# Patient Record
Sex: Male | Born: 1950
Health system: Southern US, Community
[De-identification: ages and names within clinical notes are randomized; demographics above are authoritative.]

## PROBLEM LIST (undated history)

## (undated) DIAGNOSIS — E785 Hyperlipidemia, unspecified: Secondary | ICD-10-CM

## (undated) DIAGNOSIS — R569 Unspecified convulsions: Secondary | ICD-10-CM

## (undated) DIAGNOSIS — K219 Gastro-esophageal reflux disease without esophagitis: Secondary | ICD-10-CM

## (undated) DIAGNOSIS — Z9889 Other specified postprocedural states: Secondary | ICD-10-CM

## (undated) DIAGNOSIS — H34239 Retinal artery branch occlusion, unspecified eye: Secondary | ICD-10-CM

## (undated) DIAGNOSIS — I639 Cerebral infarction, unspecified: Secondary | ICD-10-CM

## (undated) DIAGNOSIS — N261 Atrophy of kidney (terminal): Secondary | ICD-10-CM

## (undated) DIAGNOSIS — M549 Dorsalgia, unspecified: Secondary | ICD-10-CM

## (undated) DIAGNOSIS — I519 Heart disease, unspecified: Secondary | ICD-10-CM

## (undated) DIAGNOSIS — Z8719 Personal history of other diseases of the digestive system: Secondary | ICD-10-CM

## (undated) DIAGNOSIS — I63539 Cerebral infarction due to unspecified occlusion or stenosis of unspecified posterior cerebral artery: Secondary | ICD-10-CM

## (undated) DIAGNOSIS — Z972 Presence of dental prosthetic device (complete) (partial): Secondary | ICD-10-CM

## (undated) DIAGNOSIS — I6521 Occlusion and stenosis of right carotid artery: Secondary | ICD-10-CM

## (undated) DIAGNOSIS — N1330 Unspecified hydronephrosis: Secondary | ICD-10-CM

## (undated) DIAGNOSIS — G8929 Other chronic pain: Secondary | ICD-10-CM

## (undated) DIAGNOSIS — M1990 Unspecified osteoarthritis, unspecified site: Secondary | ICD-10-CM

## (undated) DIAGNOSIS — G5603 Carpal tunnel syndrome, bilateral upper limbs: Secondary | ICD-10-CM

## (undated) DIAGNOSIS — Z8781 Personal history of (healed) traumatic fracture: Secondary | ICD-10-CM

## (undated) DIAGNOSIS — G4730 Sleep apnea, unspecified: Secondary | ICD-10-CM

## (undated) DIAGNOSIS — E119 Type 2 diabetes mellitus without complications: Secondary | ICD-10-CM

## (undated) DIAGNOSIS — I723 Aneurysm of iliac artery: Secondary | ICD-10-CM

## (undated) DIAGNOSIS — H259 Unspecified age-related cataract: Secondary | ICD-10-CM

## (undated) DIAGNOSIS — R112 Nausea with vomiting, unspecified: Secondary | ICD-10-CM

## (undated) DIAGNOSIS — I219 Acute myocardial infarction, unspecified: Secondary | ICD-10-CM

## (undated) DIAGNOSIS — I10 Essential (primary) hypertension: Secondary | ICD-10-CM

## (undated) DIAGNOSIS — N200 Calculus of kidney: Secondary | ICD-10-CM

## (undated) DIAGNOSIS — I6529 Occlusion and stenosis of unspecified carotid artery: Secondary | ICD-10-CM

## (undated) DIAGNOSIS — E041 Nontoxic single thyroid nodule: Secondary | ICD-10-CM

## (undated) DIAGNOSIS — I2510 Atherosclerotic heart disease of native coronary artery without angina pectoris: Secondary | ICD-10-CM

## (undated) DIAGNOSIS — I779 Disorder of arteries and arterioles, unspecified: Secondary | ICD-10-CM

## (undated) DIAGNOSIS — I517 Cardiomegaly: Secondary | ICD-10-CM

## (undated) DIAGNOSIS — J189 Pneumonia, unspecified organism: Secondary | ICD-10-CM

## (undated) DIAGNOSIS — Z87442 Personal history of urinary calculi: Secondary | ICD-10-CM

## (undated) DIAGNOSIS — Z8673 Personal history of transient ischemic attack (TIA), and cerebral infarction without residual deficits: Secondary | ICD-10-CM

## (undated) DIAGNOSIS — K859 Acute pancreatitis without necrosis or infection, unspecified: Principal | ICD-10-CM

## (undated) DIAGNOSIS — H02889 Meibomian gland dysfunction of unspecified eye, unspecified eyelid: Secondary | ICD-10-CM

## (undated) HISTORY — PX: BACK SURGERY: SHX140

## (undated) HISTORY — PX: MULTIPLE TOOTH EXTRACTIONS: SHX2053

## (undated) HISTORY — DX: Unspecified convulsions: R56.9

## (undated) HISTORY — DX: Essential (primary) hypertension: I10

## (undated) HISTORY — DX: Disorder of arteries and arterioles, unspecified: I77.9

## (undated) HISTORY — PX: COLONOSCOPY: SHX174

## (undated) HISTORY — PX: HERNIA REPAIR: SHX51

## (undated) HISTORY — DX: Personal history of transient ischemic attack (TIA), and cerebral infarction without residual deficits: Z86.73

## (undated) HISTORY — DX: Type 2 diabetes mellitus without complications: E11.9

## (undated) HISTORY — PX: OTHER SURGICAL HISTORY: SHX169

## (undated) HISTORY — DX: Hyperlipidemia, unspecified: E78.5

## (undated) HISTORY — DX: Cerebral infarction, unspecified: I63.9

---

## 1997-11-13 ENCOUNTER — Emergency Department (HOSPITAL_COMMUNITY): Admission: EM | Admit: 1997-11-13 | Discharge: 1997-11-13 | Payer: Self-pay | Admitting: Emergency Medicine

## 1999-10-20 ENCOUNTER — Encounter: Admission: RE | Admit: 1999-10-20 | Discharge: 1999-10-20 | Payer: Self-pay | Admitting: Neurosurgery

## 1999-10-20 ENCOUNTER — Encounter: Payer: Self-pay | Admitting: Neurosurgery

## 2001-08-25 ENCOUNTER — Encounter: Payer: Self-pay | Admitting: Family Medicine

## 2001-08-25 ENCOUNTER — Ambulatory Visit (HOSPITAL_COMMUNITY): Admission: RE | Admit: 2001-08-25 | Discharge: 2001-08-25 | Payer: Self-pay | Admitting: Family Medicine

## 2001-09-06 ENCOUNTER — Ambulatory Visit (HOSPITAL_COMMUNITY): Admission: RE | Admit: 2001-09-06 | Discharge: 2001-09-06 | Payer: Self-pay | Admitting: Family Medicine

## 2001-09-06 ENCOUNTER — Encounter (INDEPENDENT_AMBULATORY_CARE_PROVIDER_SITE_OTHER): Payer: Self-pay

## 2001-09-06 ENCOUNTER — Encounter: Payer: Self-pay | Admitting: Family Medicine

## 2003-05-12 HISTORY — PX: CORONARY ANGIOPLASTY: SHX604

## 2003-12-05 ENCOUNTER — Ambulatory Visit (HOSPITAL_COMMUNITY): Admission: RE | Admit: 2003-12-05 | Discharge: 2003-12-06 | Payer: Self-pay | Admitting: Cardiology

## 2008-06-29 ENCOUNTER — Emergency Department (HOSPITAL_COMMUNITY): Admission: EM | Admit: 2008-06-29 | Discharge: 2008-06-29 | Payer: Self-pay | Admitting: Emergency Medicine

## 2010-09-26 NOTE — Cardiovascular Report (Signed)
NAME:  Shaun Pugh, Shaun Pugh                           ACCOUNT NO.:  [id number]   MEDICAL RECORD NO.:  [id number]                   PATIENT TYPE:  OIB   LOCATION:  2899                                 FACILITY:  [facility]   PHYSICIAN:  Francisca December, M.D.               DATE OF BIRTH:  1951/03/28   DATE OF PROCEDURE:  12/05/2003  DATE OF DISCHARGE:                              CARDIAC CATHETERIZATION   PROCEDURES PERFORMED:  1. Percutaneous coronary intervention/drug-eluting stent implantation distal     right coronary artery.  2. Percutaneous coronary intervention/drug-eluting stent implantation mid     left anterior descending.  3. Percutaneous closure of right femoral artery.   INDICATION:  [name] is a 60 year old man with rather typical  exertional angina pectoris.  He has undergone a myocardial perfusion study  that showed mild reversible anterior/inferior defects.  Coronary angiography  has confirmed the presence of a 75% mid tubular LAD lesion.  There is also a  distal diffuse RCA lesion of 80%-90% stenosis.  He is brought now to the  cardiac catheterization laboratory for percutaneous revascularization.   DESCRIPTION OF PROCEDURE:  The patient was brought to the cardiac  catheterization laboratory in a fasting state.  The right groin was prepped  and draped in the usual sterile fashion.  Local anesthesia was obtained with  infiltration of 1% lidocaine.  A 6-French catheterization sheath was  inserted percutaneously into the right femoral artery utilizing an anterior  approach over a guiding J-wire.  A 6-French #4 right coronary artery guiding  catheter was advanced to the ascending aorta where the right coronary ostia  was engaged.  The patient received an initial bolus of bivalirudin at 0.75  mg/kg and then a constant infusion of 1.75 mg/kg per hour.  The resultant  ACT five minutes later was 334 seconds.  A 0.014 inch Scimed luge  intercoronary guidewire was passed across  the lesion in the distal right  coronary without difficulty.  The lesion was primarily stented using a  3.5/28 mm Cordis Cypher drug-eluting stent.  This was carefully placed  across a distal lesion and inflated to a peak pressure of 18 atmospheres on  two occasions.  This resulted in wide patency of the distal right coronary  with an excellent angiographic result.  There was a stepup and stepdown in  the proximal and distal edges of the stent.  There was good TIMI-III flow.  Following confirmation of adequate patency in orthogonal views, the guide  catheter and the guiding wire were removed.  Repeat angiography was  performed in the RAO projection.  The guiding catheter was then removed.  It  was exchanged for a  6-French #3.5 CLS left coronary guiding catheter.  A 0.014 inch Scimed luge  wire was advanced down the LAD without difficulty.  A lesion in the  midportion of the vessel again was primarily stented using a  3.0/28 mm Cypher stent.  This was inflated to 14 atmospheres for 50 seconds.  The stent balloon was removed and it was replaced with a 3.25/12 mm Scimed  Quantum Maverick intercoronary balloon.  This was placed in the more distal  portion of the stent and inflated to 12 atmospheres for 51 seconds.  It was  then withdrawn slightly into the more proximal segment of the stent and  inflated to 12 atmospheres for 28 seconds.  These maneuvers resulted in  again an excellent angiographic result with a stepup and stepdown at the  proximal and distal edges of the stent.  There was no evidence of any  dissection.  The stent did cover a moderate-sized diagonal branch which was  not compromised.  Following confirmation of adequate patency in orthogonal  views both with and without the guidewire in place, the guiding catheter was  removed.  Via the right femoral sheath, a 45-degree RAO femoral arteriogram  was performed using hand injection.  This confirmed the common femoral  artery to  be widely patent.  There was some mild diffuse atherosclerotic  disease that did not respond to intra-arterial nitroglycerin.  The  arteriotomy site was well above the bifurcation in the profunda femoris and  superficial femoral arteries.  I elected to proceed with percutaneous  closure primarily due to the fact that the patient had not been taking oral  aspirin on a daily basis and I plan to administer IV heparin for 24 hours.  Therefore, the 6-French Angio-Seal was deployed without difficulty.  There  was excellent hemostasis and an intact distal pulse at completion.  Of note,  and as stated above, the patient was not taking daily oral aspirin.  He was  taking 75 mg of Plavix daily.  He was administered four baby aspirin (81 mg)  at the initiation of the procedure.  We will provide an additional 325-mg  dose of aspirin at 2100 today and of course continue oral daily aspirin at  the same dose indefinitely.   ANGIOGRAPHY:  As mentioned, the lesions treated were in the mid LAD and  distal RCA.  The distal RCA lesion was diffuse and involved a 30%-40%  stenosis followed shortly by a 90% stenosis.  The entire lesion was covered  with the 3.5/28 mm Cypher.  Following balloon dilatation and stent  implantation, there was no residual stenosis.  In the left anterior  descending artery, the lesion was tubular to diffuse in the midportion of  the vessel.  There was diffuse disease in the proximal portion of the vessel  but no greater than 30%.  Following balloon dilatation and stent  implantation, there was no residual stenosis in the LAD either.  Excellent  TIMI-III flow was present following both stent implantations.   At the completion of the procedure, the patient was transported to the  recovery area having mild anterior chest tightness as is typically seen with  high-pressure balloon dilatation following stent implantation.  FINAL IMPRESSION:  1. Atherosclerotic coronary vascular disease,  two vessel.  2. Status post successful percutaneous coronary intervention and drug-     eluting stent implantation distal right coronary artery and mid left     anterior descending.  3. Typical angina was reproduced with device insertion and balloon     inflation.  Francisca December, M.D.    JHE/MEDQ  D:  12/05/2003  T:  12/05/2003  Job:  [id number]   cc:   Molly Maduro A. Nicholos Johns, M.D.  [address]  [place]  [place] [address]  Fax: [phone]   Cardiac Catheterization Laboratory

## 2011-09-14 ENCOUNTER — Emergency Department (HOSPITAL_COMMUNITY): Payer: Medicare Other

## 2011-09-14 ENCOUNTER — Encounter (HOSPITAL_COMMUNITY): Payer: Self-pay | Admitting: Emergency Medicine

## 2011-09-14 ENCOUNTER — Inpatient Hospital Stay (HOSPITAL_COMMUNITY)
Admission: EM | Admit: 2011-09-14 | Discharge: 2011-09-16 | DRG: 440 | Disposition: A | Discharge status: Home / Self Care | Payer: Medicare Other | Attending: Internal Medicine | Admitting: Internal Medicine

## 2011-09-14 DIAGNOSIS — R160 Hepatomegaly, not elsewhere classified: Secondary | ICD-10-CM | POA: Diagnosis present

## 2011-09-14 DIAGNOSIS — Z91199 Patient's noncompliance with other medical treatment and regimen due to unspecified reason: Secondary | ICD-10-CM

## 2011-09-14 DIAGNOSIS — R030 Elevated blood-pressure reading, without diagnosis of hypertension: Secondary | ICD-10-CM | POA: Diagnosis present

## 2011-09-14 DIAGNOSIS — N269 Renal sclerosis, unspecified: Secondary | ICD-10-CM | POA: Diagnosis present

## 2011-09-14 DIAGNOSIS — K859 Acute pancreatitis without necrosis or infection, unspecified: Secondary | ICD-10-CM

## 2011-09-14 DIAGNOSIS — E785 Hyperlipidemia, unspecified: Secondary | ICD-10-CM | POA: Diagnosis present

## 2011-09-14 DIAGNOSIS — R7309 Other abnormal glucose: Secondary | ICD-10-CM | POA: Diagnosis present

## 2011-09-14 DIAGNOSIS — K8020 Calculus of gallbladder without cholecystitis without obstruction: Secondary | ICD-10-CM

## 2011-09-14 DIAGNOSIS — F17200 Nicotine dependence, unspecified, uncomplicated: Secondary | ICD-10-CM | POA: Diagnosis present

## 2011-09-14 DIAGNOSIS — N200 Calculus of kidney: Secondary | ICD-10-CM | POA: Diagnosis present

## 2011-09-14 DIAGNOSIS — I723 Aneurysm of iliac artery: Secondary | ICD-10-CM | POA: Diagnosis present

## 2011-09-14 DIAGNOSIS — N261 Atrophy of kidney (terminal): Secondary | ICD-10-CM | POA: Diagnosis present

## 2011-09-14 DIAGNOSIS — R7989 Other specified abnormal findings of blood chemistry: Secondary | ICD-10-CM

## 2011-09-14 DIAGNOSIS — R110 Nausea: Secondary | ICD-10-CM

## 2011-09-14 DIAGNOSIS — R739 Hyperglycemia, unspecified: Secondary | ICD-10-CM

## 2011-09-14 DIAGNOSIS — Z9119 Patient's noncompliance with other medical treatment and regimen: Secondary | ICD-10-CM

## 2011-09-14 HISTORY — DX: Acute pancreatitis without necrosis or infection, unspecified: K85.90

## 2011-09-14 HISTORY — DX: Calculus of kidney: N20.0

## 2011-09-14 HISTORY — DX: Atrophy of kidney (terminal): N26.1

## 2011-09-14 HISTORY — DX: Aneurysm of iliac artery: I72.3

## 2011-09-14 LAB — DIFFERENTIAL
Basophils Absolute: 0.1 10*3/uL (ref 0.0–0.1)
Basophils Relative: 0 % (ref 0–1)
Eosinophils Absolute: 0.3 10*3/uL (ref 0.0–0.7)
Eosinophils Relative: 3 % (ref 0–5)
Monocytes Absolute: 1 10*3/uL (ref 0.1–1.0)
Monocytes Relative: 8 % (ref 3–12)
Neutro Abs: 8.3 10*3/uL — ABNORMAL HIGH (ref 1.7–7.7)

## 2011-09-14 LAB — URINALYSIS, ROUTINE W REFLEX MICROSCOPIC
Glucose, UA: NEGATIVE mg/dL
Ketones, ur: NEGATIVE mg/dL
Nitrite: NEGATIVE
pH: 6 (ref 5.0–8.0)

## 2011-09-14 LAB — COMPREHENSIVE METABOLIC PANEL
AST: 11 U/L (ref 0–37)
Albumin: 3.7 g/dL (ref 3.5–5.2)
BUN: 19 mg/dL (ref 6–23)
Calcium: 9.2 mg/dL (ref 8.4–10.5)
Creatinine, Ser: 1.14 mg/dL (ref 0.50–1.35)
Total Bilirubin: 0.6 mg/dL (ref 0.3–1.2)
Total Protein: 7.7 g/dL (ref 6.0–8.3)

## 2011-09-14 LAB — GLUCOSE, CAPILLARY

## 2011-09-14 LAB — CBC
HCT: 43.8 % (ref 39.0–52.0)
Hemoglobin: 15.4 g/dL (ref 13.0–17.0)
MCH: 31 pg (ref 26.0–34.0)
MCHC: 35.2 g/dL (ref 30.0–36.0)
MCV: 88.3 fL (ref 78.0–100.0)
RDW: 13.4 % (ref 11.5–15.5)

## 2011-09-14 LAB — LIPASE, BLOOD: Lipase: 62 U/L — ABNORMAL HIGH (ref 11–59)

## 2011-09-14 LAB — POCT I-STAT TROPONIN I

## 2011-09-14 MED ORDER — HYDROMORPHONE HCL PF 1 MG/ML IJ SOLN
1.0000 mg | INTRAMUSCULAR | Status: DC | PRN
  Administered 2011-09-14 – 2011-09-15 (×3): 1 mg via INTRAVENOUS
  Filled 2011-09-14 (×3): qty 1

## 2011-09-14 MED ORDER — ONDANSETRON HCL 4 MG/2ML IJ SOLN: 4.0000 mg | INTRAMUSCULAR | Status: DC | PRN

## 2011-09-14 MED ORDER — SODIUM CHLORIDE 0.9 % IV SOLN: INTRAVENOUS | Status: DC

## 2011-09-14 MED ORDER — ONDANSETRON HCL 4 MG PO TABS: 4.0000 mg | ORAL_TABLET | Freq: Four times a day (QID) | ORAL | Status: DC | PRN

## 2011-09-14 MED ORDER — MORPHINE SULFATE 4 MG/ML IJ SOLN
4.0000 mg | INTRAMUSCULAR | Status: DC | PRN
  Administered 2011-09-14 (×2): 4 mg via INTRAVENOUS
  Filled 2011-09-14 (×2): qty 1

## 2011-09-14 MED ORDER — SODIUM CHLORIDE 0.9 % IV BOLUS (SEPSIS)
1000.0000 mL | Freq: Once | INTRAVENOUS | Status: AC
  Administered 2011-09-14: 1000 mL via INTRAVENOUS

## 2011-09-14 MED ORDER — SENNOSIDES-DOCUSATE SODIUM 8.6-50 MG PO TABS: 1.0000 | ORAL_TABLET | Freq: Every evening | ORAL | Status: DC | PRN

## 2011-09-14 MED ORDER — NICOTINE 21 MG/24HR TD PT24
21.0000 mg | MEDICATED_PATCH | Freq: Every day | TRANSDERMAL | Status: DC
  Administered 2011-09-14 – 2011-09-16 (×3): 21 mg via TRANSDERMAL
  Filled 2011-09-14 (×3): qty 1

## 2011-09-14 MED ORDER — IOHEXOL 300 MG/ML  SOLN
100.0000 mL | Freq: Once | INTRAMUSCULAR | Status: AC | PRN
  Administered 2011-09-14: 100 mL via INTRAVENOUS

## 2011-09-14 MED ORDER — ONDANSETRON HCL 4 MG/2ML IJ SOLN
4.0000 mg | INTRAMUSCULAR | Status: DC | PRN
  Administered 2011-09-14: 4 mg via INTRAVENOUS
  Filled 2011-09-14: qty 2

## 2011-09-14 MED ORDER — POTASSIUM CHLORIDE IN NACL 20-0.9 MEQ/L-% IV SOLN
INTRAVENOUS | Status: DC
  Administered 2011-09-14 – 2011-09-16 (×5): via INTRAVENOUS

## 2011-09-14 MED ORDER — ALUM & MAG HYDROXIDE-SIMETH 200-200-20 MG/5ML PO SUSP: 30.0000 mL | Freq: Four times a day (QID) | ORAL | Status: DC | PRN

## 2011-09-14 MED ORDER — SODIUM CHLORIDE 0.9 % IV SOLN
INTRAVENOUS | Status: DC
  Administered 2011-09-14: 09:00:00 via INTRAVENOUS

## 2011-09-14 MED ORDER — ACETAMINOPHEN 325 MG PO TABS: 650.0000 mg | ORAL_TABLET | Freq: Four times a day (QID) | ORAL | Status: DC | PRN

## 2011-09-14 MED ORDER — ZOLPIDEM TARTRATE 5 MG PO TABS: 5.0000 mg | ORAL_TABLET | Freq: Every evening | ORAL | Status: DC | PRN

## 2011-09-14 MED ORDER — ONDANSETRON HCL 4 MG/2ML IJ SOLN: 4.0000 mg | Freq: Four times a day (QID) | INTRAMUSCULAR | Status: DC | PRN

## 2011-09-14 MED ORDER — ACETAMINOPHEN 650 MG RE SUPP: 650.0000 mg | Freq: Four times a day (QID) | RECTAL | Status: DC | PRN

## 2011-09-14 MED ORDER — ENOXAPARIN SODIUM 40 MG/0.4ML ~~LOC~~ SOLN
40.0000 mg | SUBCUTANEOUS | Status: DC
  Administered 2011-09-14 – 2011-09-15 (×2): 40 mg via SUBCUTANEOUS
  Filled 2011-09-14 (×2): qty 0.4

## 2011-09-14 NOTE — ED Notes (Signed)
Pt states he is feeling better.  

## 2011-09-14 NOTE — ED Notes (Signed)
Pt c/o generalized abd pain x 5 days worse on right side, with nausea. lnbm-yesterday. Denies v/d.

## 2011-09-14 NOTE — ED Provider Notes (Cosign Needed)
History    This chart was scribed for Shaun Givens, MD, MD by Smitty Pluck. The patient was seen in room APA05 and the patient's care was started at 7:25AM.   CSN: [id number]  Arrival date & time 09/14/11  7829   None     Chief Complaint  Patient presents with  . Abdominal Pain  . Nausea    (Consider location/radiation/quality/duration/timing/severity/associated sxs/prior treatment) The history is provided by the patient.   Shaun Pugh is a 61 y.o. male who presents to the Emergency Department complaining of moderate abdominal pain onset 4 days ago. He states the pain feels like his food gets stuck in his upper abdomen. The pain has been diffuse since it started but he reports that pain sometimes occurs in his right side, and then sometimes on the left side. He reports the pain feels like a knot. Pt states that he had a lot of nausea. Denies diarrhea and vomiting. He took ex-lax 2 days ago but pain was not relieved despite having good results.  Last normal bowel movement was 1 day ago. He states that he would feel dizzy when he would get out of bed, and has spent the past two days in bed because of the pain. He felt better this morning and ate, and the pain got worse again. He denies any fever. Sometimes he feels the pain in his right upper flank region.  He denies having similar symptoms in the past. Pt reports that he smokes and denies drinking alcohol. Denies sick contact. Eating and laying on his back aggravates the pain. Nothing makes the pain feel better. He takes medication for high cholesterol and aspirin.  Denies hx of abdomen surgeries. The pain has been constant since onset.   PCP is Dr. Catalina Pizza  Past Medical History  Diagnosis Date  . High cholesterol     Past Surgical History  Procedure Date  . Back surgery     No family history on file.  History  Substance Use Topics  . Smoking status: Current Everyday Smoker  . Smokeless tobacco: Not on file  . Alcohol Use: No   lives at home Lives with spouse    Review of Systems  All other systems reviewed and are negative.   10 Systems reviewed and all are negative for acute change except as noted in the HPI.   Allergies  Review of patient's allergies indicates no known allergies.  Home Medications   Current Outpatient Rx  Name Route Sig Dispense Refill  . IBUPROFEN 200 MG PO TABS Oral Take 400 mg by mouth every 6 (six) hours as needed. Pain    . EX-LAX PO Oral Take 1 tablet by mouth daily as needed. Constipation      BP 116/77  Pulse 72  Temp 97.9 F (36.6 C)  Resp 20  Ht 6\' 1"  (1.854 m)  Wt 252 lb (114.306 kg)  BMI 33.25 kg/m2  SpO2 97%  Vital signs normal    Physical Exam  Nursing note and vitals reviewed. Constitutional: He is oriented to person, place, and time. He appears well-developed and well-nourished. No distress.  HENT:  Head: Normocephalic and atraumatic.  Right Ear: External ear normal.  Left Ear: External ear normal.       Dry tongue  Eyes: Conjunctivae and EOM are normal. Pupils are equal, round, and reactive to light.  Neck: Normal range of motion. Neck supple. No thyromegaly present.  Cardiovascular: Normal rate, regular rhythm and normal heart sounds.  Pulmonary/Chest: Effort normal and breath sounds normal. No respiratory distress. He has no wheezes. He has no rales. He exhibits no tenderness.  Abdominal: Soft. Bowel sounds are normal. There is tenderness (right mid abdomen and LLQ ). There is no rebound and no guarding.    Musculoskeletal: Normal range of motion. He exhibits no edema.  Neurological: He is alert and oriented to person, place, and time.  Skin: Skin is warm and dry.  Psychiatric: He has a normal mood and affect. His behavior is normal.    ED Course  Procedures (including critical care time)  Medications  0.9 %  sodium chloride infusion (  Intravenous New Bag/Given 09/14/11 0844)  morphine 4 MG/ML injection 4 mg (4 mg Intravenous Given 09/14/11  0854)  ondansetron (ZOFRAN) injection 4 mg (4 mg Intravenous Given 09/14/11 0741)  0.9 %  sodium chloride infusion (not administered)  ondansetron (ZOFRAN) injection 4 mg (not administered)  morphine 4 MG/ML injection 4 mg (not administered)  sodium chloride 0.9 % bolus 1,000 mL (1000 mL Intravenous Given 09/14/11 0741)  iohexol (OMNIPAQUE) 300 MG/ML solution 100 mL (100 mL Intravenous Contrast Given 09/14/11 0835)    10:37 Dr Paris Lore, admit to med-surg, team 1    DIAGNOSTIC STUDIES: Oxygen Saturation is 98% on room air, normal by my interpretation.    COORDINATION OF CARE: 7:31AM EDP discusses pt ED treatment course with pt.  7:45AM EDP orders medication: 0.9% NaCl infusion, zofran 4 mg, morphine 4 mg 9:13AM EDP rechecked pt. Pt still has abdominal pain.  9:44AM EDP recheck. Discusses pt lab results. Pt has pancreatitis and gallstones.    Results for orders placed during the hospital encounter of 09/14/11  CBC      Component Value Range   WBC 11.8 (*) 4.0 - 10.5 (K/uL)   RBC 4.96  4.22 - 5.81 (MIL/uL)   Hemoglobin 15.4  13.0 - 17.0 (g/dL)   HCT 14.7  82.9 - 56.2 (%)   MCV 88.3  78.0 - 100.0 (fL)   MCH 31.0  26.0 - 34.0 (pg)   MCHC 35.2  30.0 - 36.0 (g/dL)   RDW 13.0  86.5 - 78.4 (%)   Platelets 181  150 - 400 (K/uL)  DIFFERENTIAL      Component Value Range   Neutrophils Relative 71  43 - 77 (%)   Neutro Abs 8.3 (*) 1.7 - 7.7 (K/uL)   Lymphocytes Relative 18  12 - 46 (%)   Lymphs Abs 2.1  0.7 - 4.0 (K/uL)   Monocytes Relative 8  3 - 12 (%)   Monocytes Absolute 1.0  0.1 - 1.0 (K/uL)   Eosinophils Relative 3  0 - 5 (%)   Eosinophils Absolute 0.3  0.0 - 0.7 (K/uL)   Basophils Relative 0  0 - 1 (%)   Basophils Absolute 0.1  0.0 - 0.1 (K/uL)  COMPREHENSIVE METABOLIC PANEL      Component Value Range   Sodium 134 (*) 135 - 145 (mEq/L)   Potassium 3.7  3.5 - 5.1 (mEq/L)   Chloride 100  96 - 112 (mEq/L)   CO2 21  19 - 32 (mEq/L)   Glucose, Bld 209 (*) 70 - 99 (mg/dL)   BUN  19  6 - 23 (mg/dL)   Creatinine, Ser 6.96  0.50 - 1.35 (mg/dL)   Calcium 9.2  8.4 - 29.5 (mg/dL)   Total Protein 7.7  6.0 - 8.3 (g/dL)   Albumin 3.7  3.5 - 5.2 (g/dL)   AST 11  0 - 37 (U/L)   ALT 13  0 - 53 (U/L)   Alkaline Phosphatase 83  39 - 117 (U/L)   Total Bilirubin 0.6  0.3 - 1.2 (mg/dL)   GFR calc non Af Amer 68 (*) >90 (mL/min)   GFR calc Af Amer 78 (*) >90 (mL/min)  LIPASE, BLOOD      Component Value Range   Lipase 62 (*) 11 - 59 (U/L)  URINALYSIS, ROUTINE W REFLEX MICROSCOPIC      Component Value Range   Color, Urine YELLOW  YELLOW    APPearance CLEAR  CLEAR    Specific Gravity, Urine 1.005  1.005 - 1.030    pH 6.0  5.0 - 8.0    Glucose, UA NEGATIVE  NEGATIVE (mg/dL)   Hgb urine dipstick NEGATIVE  NEGATIVE    Bilirubin Urine NEGATIVE  NEGATIVE    Ketones, ur NEGATIVE  NEGATIVE (mg/dL)   Protein, ur NEGATIVE  NEGATIVE (mg/dL)   Urobilinogen, UA 0.2  0.0 - 1.0 (mg/dL)   Nitrite NEGATIVE  NEGATIVE    Leukocytes, UA NEGATIVE  NEGATIVE    Laboratory interpretation all normal except cytosis, elevated lipase, elevated glucose   Ct Abdomen Pelvis W Contrast  09/14/2011  *RADIOLOGY REPORT*  Clinical Data: Diffuse abdominal pain for 5 days.  Question colitis.  CT ABDOMEN AND PELVIS WITH CONTRAST  Technique:  Multidetector CT imaging of the abdomen and pelvis was performed following the standard protocol during bolus administration of intravenous contrast.  Contrast: OMNIPAQUE IOHEXOL 300 MG/ML  SOLN  Comparison: None.  Findings: Clear lung bases.  Mild cardiomegaly without pericardial or pleural effusion.  Distal right coronary artery atherosclerosis on image 9.  Hepatomegaly, 19.6 cm cranial caudal.  No focal liver lesions.  A small splenule.  Normal stomach.  There is edema within the anterior pararenal space. This is centered around the pancreatic tail, including on images 31 - 34.  There is concurrent mild soft tissue fullness within this area of the pancreas.  Surrounding  vasculature is patent.  No peripancreatic fluid collection.  A gallstone without acute cholecystitis or biliary ductal dilatation.  Normal adrenal glands.  Moderate right renal cortical atrophy.  A lower pole right renal calculus.  Left renal cysts and right renal too small to characterize lesions.  The proximal ureters are mildly prominent.  This is felt to be related to bladder distention.  No obstructive cause identified.  1.7 cm porta hepatis node on image 24.  A portacaval node measures 1.8 cm on image 32.  Normal colon, appendix, and terminal ileum.  Normal small bowel without abdominal ascites.  The infrarenal abdominal aorta is nonaneurysmally dilated up to 2.4 cm.  The right common iliac artery is mildly aneurysmal 1.6 cm on image 59. Normal urinary bladder and prostate.  No significant free fluid.  Surgical changes in the lumbar spine.  IMPRESSION:  1.  Edema surrounding the pancreatic tail, consistent with pancreatitis.  Concurrent soft tissue fullness in this region of the pancreas, likely also related to pancreatitis.  Correlate with pancreatic enzyme levels.  Depending on clinical concern, pre and post contrast abdominal MRI at approximately 6 weeks should be considered to exclude underlying occult obstructive pancreatic lesion. 2.  Cholelithiasis without cholecystitis. 3.  Borderline/mild porta hepatis adenopathy.  Concurrent hepatomegaly.  This is nonspecific.   Correlate with risk factors for hepatitis or hepatic steatosis. 4.  Right renal atrophy with lower pole right renal calculus. 5.  Right common iliac artery aneurysm with non  aneurysmal dilatation of the infrarenal aorta.  Original Report Authenticated By: Consuello Bossier, M.D.       Date: 09/14/2011  Rate: 82  Rhythm: normal sinus rhythm  QRS Axis: right  Intervals: normal  ST/T Wave abnormalities: nonspecific ST/T changes  Conduction Disutrbances:right bundle branch block  Narrative Interpretation:   Old EKG Reviewed: changes  noted from 12/06/2003, RBBB is new    1. Pancreatitis, acute   2. Gallstones   3. Nausea   4. Hyperglycemia    Plan admission  Devoria Albe, MD, FACEP    MDM   I personally performed the services described in this documentation, which was scribed in my presence. The recorded information has been reviewed and considered.  Devoria Albe, MD, Armando Gang        Shaun Givens, MD 09/14/11 [phone]

## 2011-09-14 NOTE — ED Notes (Signed)
Pt in ct 

## 2011-09-14 NOTE — H&P (Signed)
Hospital Admission Note Date: 09/14/2011  Patient name: Shaun Pugh. [facility] Medical record number: [id number] Date of birth: 12-13-1950 Age: 61 y.o. Gender: male PCP: [name], MD, MD  Attending physician: Christiane Ha, MD  Chief Complaint: Abdominal pain and nausea  History of Present Illness:  Shaun Pugh is an 61 y.o. male who presents with a several day history of worsening epigastric pain and right flank pain, nausea but no vomiting. He tried ex-lax and ibuprofen but found no relief. He's had no fevers or chills. He had a chicken biscuit this morning and the pain got worse so he came to the emergency room. He feels slightly better now but the pain remains. He's asking for a hamburger. He has a history of hyperlipidemia but stopped taking his statin, because he was tired of taking medications. In the emergency room, he had a slightly elevated lipase, and a CAT scan showing cholelithiasis and pancreatitis. Liver function tests are normal. Patient denies any alcohol use whatsoever. No previous history of pancreatitis.   Past Medical History  Diagnosis Date  . High cholesterol     Meds: Prescriptions prior to admission  Medication Sig Dispense Refill  . ibuprofen (ADVIL,MOTRIN) 200 MG tablet Take 400 mg by mouth every 6 (six) hours as needed. Pain      . Sennosides (EX-LAX PO) Take 1 tablet by mouth daily as needed. Constipation        Allergies: Review of patient's allergies indicates no known allergies. History   Social History  . Marital Status: Married    Spouse Name: N/A    Number of Children: N/A  . Years of Education: N/A   Occupational History  . Not on file.   Social History Main Topics  . Smoking status: Current Everyday Smoker  . Smokeless tobacco: Not on file  . Alcohol Use: No  . Drug Use: No  . Sexually Active:    Other Topics Concern  . Not on file   Social History Narrative  . No narrative on file   Family history: His sister had a  cholecystectomy last year. No family history of cancer.  Past Surgical History  Procedure Date  . Back surgery     Review of Systems: Systems reviewed and as per HPI, otherwise negative.  Physical Exam: Blood pressure 111/65, pulse 38, temperature 98.4 F (36.9 C), temperature source Oral, resp. rate 20, height 6\' 1"  (1.854 m), weight 116.5 kg (256 lb 13.4 oz), SpO2 95.00%. BP 111/65  Pulse 38  Temp(Src) 98.4 F (36.9 C) (Oral)  Resp 20  Ht 6\' 1"  (1.854 m)  Wt 116.5 kg (256 lb 13.4 oz)  BMI 33.89 kg/m2  SpO2 95%  General Appearance:    Alert, cooperative, no distress, appears stated age  Head:    Normocephalic, without obvious abnormality, atraumatic  Eyes:    PERRL, conjunctiva/corneas clear, EOM's intact, fundi    benign, both eyes          Nose:   Nares normal, septum midline, mucosa normal, no drainage    or sinus tenderness  Throat:   Lips, mucosa, and tongue normal; teeth and gums normal  Neck:   Supple, symmetrical, trachea midline, no adenopathy;       thyroid:  No enlargement/tenderness/nodules; no carotid   bruit or JVD  Back:     Symmetric, no curvature, ROM normal, no CVA tenderness  Lungs:     Clear to auscultation bilaterally, respirations unlabored  Chest wall:    No tenderness  or deformity  Heart:    Regular rate and rhythm, S1 and S2 normal, no murmur, rub   or gallop  Abdomen:     Soft, minimal epigastric tenderness. No right upper quadrant tenderness. Obese   Genitalia:   deferred   Rectal:   deferred   Extremities:   Extremities normal, atraumatic, no cyanosis or edema  Pulses:   2+ and symmetric all extremities  Skin:   Skin color, texture, turgor normal, no rashes or lesions  Lymph nodes:   Cervical, supraclavicular, and axillary nodes normal  Neurologic:   CNII-XII intact. Normal strength, sensation and reflexes      throughout    Psychiatric: Normal affect.  Lab results: Basic Metabolic Panel:  Basename 09/14/11 0731  NA 134*  K 3.7  CL  100  CO2 21  GLUCOSE 209*  BUN 19  CREATININE 1.14  CALCIUM 9.2  MG --  PHOS --   Liver Function Tests:  [facility] 09/14/11 0731  AST 11  ALT 13  ALKPHOS 83  BILITOT 0.6  PROT 7.7  ALBUMIN 3.7    Basename 09/14/11 0731  LIPASE 62*  AMYLASE --   No results found for this basename: AMMONIA:2 in the last 72 hours CBC:  Basename 09/14/11 0731  WBC 11.8*  NEUTROABS 8.3*  HGB 15.4  HCT 43.8  MCV 88.3  PLT 181   Cardiac Enzymes: No results found for this basename: CKTOTAL:3,CKMB:3,CKMBINDEX:3,TROPONINI:3 in the last 72 hours BNP: No results found for this basename: PROBNP:3 in the last 72 hours D-Dimer: No results found for this basename: DDIMER:2 in the last 72 hours CBG:  Basename 09/14/11 1827  GLUCAP 89   Hemoglobin A1C: No results found for this basename: HGBA1C in the last 72 hours Fasting Lipid Panel: No results found for this basename: CHOL,HDL,LDLCALC,TRIG,CHOLHDL,LDLDIRECT in the last 72 hours Thyroid Function Tests: No results found for this basename: TSH,T4TOTAL,FREET4,T3FREE,THYROIDAB in the last 72 hours Anemia Panel: No results found for this basename: VITAMINB12,FOLATE,FERRITIN,TIBC,IRON,RETICCTPCT in the last 72 hours Coagulation: No results found for this basename: LABPROT:2,INR:2 in the last 72 hours Urine Drug Screen: Drugs of Abuse  No results found for this basename: labopia, cocainscrnur, labbenz, amphetmu, thcu, labbarb    Alcohol Level: No results found for this basename: ETH:2 in the last 72 hours Urinalysis:  Basename 09/14/11 0851  COLORURINE YELLOW  LABSPEC 1.005  PHURINE 6.0  GLUCOSEU NEGATIVE  HGBUR NEGATIVE  BILIRUBINUR NEGATIVE  KETONESUR NEGATIVE  PROTEINUR NEGATIVE  UROBILINOGEN 0.2  NITRITE NEGATIVE  LEUKOCYTESUR NEGATIVE   Imaging results:  Ct Abdomen Pelvis W Contrast  09/14/2011  *RADIOLOGY REPORT*  Clinical Data: Diffuse abdominal pain for 5 days.  Question colitis.  CT ABDOMEN AND PELVIS WITH CONTRAST   Technique:  Multidetector CT imaging of the abdomen and pelvis was performed following the standard protocol during bolus administration of intravenous contrast.  Contrast: OMNIPAQUE IOHEXOL 300 MG/ML  SOLN  Comparison: None.  Findings: Clear lung bases.  Mild cardiomegaly without pericardial or pleural effusion.  Distal right coronary artery atherosclerosis on image 9.  Hepatomegaly, 19.6 cm cranial caudal.  No focal liver lesions.  A small splenule.  Normal stomach.  There is edema within the anterior pararenal space. This is centered around the pancreatic tail, including on images 31 - 34.  There is concurrent mild soft tissue fullness within this area of the pancreas.  Surrounding vasculature is patent.  No peripancreatic fluid collection.  A gallstone without acute cholecystitis or biliary ductal dilatation.  Normal  adrenal glands.  Moderate right renal cortical atrophy.  A lower pole right renal calculus.  Left renal cysts and right renal too small to characterize lesions.  The proximal ureters are mildly prominent.  This is felt to be related to bladder distention.  No obstructive cause identified.  1.7 cm porta hepatis node on image 24.  A portacaval node measures 1.8 cm on image 32.  Normal colon, appendix, and terminal ileum.  Normal small bowel without abdominal ascites.  The infrarenal abdominal aorta is nonaneurysmally dilated up to 2.4 cm.  The right common iliac artery is mildly aneurysmal 1.6 cm on image 59. Normal urinary bladder and prostate.  No significant free fluid.  Surgical changes in the lumbar spine.  IMPRESSION:  1.  Edema surrounding the pancreatic tail, consistent with pancreatitis.  Concurrent soft tissue fullness in this region of the pancreas, likely also related to pancreatitis.  Correlate with pancreatic enzyme levels.  Depending on clinical concern, pre and post contrast abdominal MRI at approximately 6 weeks should be considered to exclude underlying occult obstructive  pancreatic lesion. 2.  Cholelithiasis without cholecystitis. 3.  Borderline/mild porta hepatis adenopathy.  Concurrent hepatomegaly.  This is nonspecific.   Correlate with risk factors for hepatitis or hepatic steatosis. 4.  Right renal atrophy with lower pole right renal calculus. 5.  Right common iliac artery aneurysm with non aneurysmal dilatation of the infrarenal aorta.  Original Report Authenticated By: Consuello Bossier, M.D.    Assessment & Plan: Principal Problem:  *Acute pancreatitis suspect possibly a passed gallstone. Will give IV fluids, pain medication nausea medication bowel rest. We'll also check a triglyceride level. Patient denies alcohol use. Once his symptoms have resolved, he can followup with surgery for an elective cholecystectomy.   Cholelithiasis, without signs of cholecystitis  Hyperglycemia, no history of diabetes. We'll check a hemoglobin A1c and monitor blood glucoses.  Current every day smoker: NicoDerm patch. Hyperlipidemia, noncompliant with statin. Obesity    Theodus Ran L 09/14/2011, 7:08 PM

## 2011-09-14 NOTE — ED Notes (Signed)
Received report; agree with previous assesstment

## 2011-09-15 ENCOUNTER — Encounter (HOSPITAL_COMMUNITY): Payer: Self-pay | Admitting: Internal Medicine

## 2011-09-15 DIAGNOSIS — E785 Hyperlipidemia, unspecified: Secondary | ICD-10-CM

## 2011-09-15 DIAGNOSIS — K8020 Calculus of gallbladder without cholecystitis without obstruction: Secondary | ICD-10-CM

## 2011-09-15 DIAGNOSIS — N261 Atrophy of kidney (terminal): Secondary | ICD-10-CM

## 2011-09-15 DIAGNOSIS — K859 Acute pancreatitis without necrosis or infection, unspecified: Secondary | ICD-10-CM

## 2011-09-15 DIAGNOSIS — R7989 Other specified abnormal findings of blood chemistry: Secondary | ICD-10-CM

## 2011-09-15 DIAGNOSIS — N200 Calculus of kidney: Secondary | ICD-10-CM | POA: Diagnosis present

## 2011-09-15 HISTORY — DX: Atrophy of kidney (terminal): N26.1

## 2011-09-15 LAB — BASIC METABOLIC PANEL
BUN: 17 mg/dL (ref 6–23)
Chloride: 108 mEq/L (ref 96–112)
Creatinine, Ser: 1.16 mg/dL (ref 0.50–1.35)
GFR calc Af Amer: 77 mL/min — ABNORMAL LOW (ref >90)
GFR calc non Af Amer: 66 mL/min — ABNORMAL LOW (ref >90)
Potassium: 4.4 mEq/L (ref 3.5–5.1)

## 2011-09-15 LAB — CBC
HCT: 40.1 % (ref 39.0–52.0)
Hemoglobin: 13.6 g/dL (ref 13.0–17.0)
MCHC: 33.9 g/dL (ref 30.0–36.0)
RDW: 13.5 % (ref 11.5–15.5)
WBC: 8.3 10*3/uL (ref 4.0–10.5)

## 2011-09-15 LAB — GLUCOSE, CAPILLARY
Glucose-Capillary: 81 mg/dL (ref 70–99)
Glucose-Capillary: 88 mg/dL (ref 70–99)
Glucose-Capillary: 85 mg/dL (ref 70–99)

## 2011-09-15 LAB — HEMOGLOBIN A1C
Hgb A1c MFr Bld: 5.8 % — ABNORMAL HIGH (ref <5.7)
Mean Plasma Glucose: 120 mg/dL — ABNORMAL HIGH (ref <117)

## 2011-09-15 LAB — LIPASE, BLOOD: Lipase: 33 U/L (ref 11–59)

## 2011-09-15 MED ORDER — MORPHINE SULFATE 4 MG/ML IJ SOLN
5.0000 mg | INTRAMUSCULAR | Status: DC | PRN
  Administered 2011-09-15: 5 mg via INTRAVENOUS
  Filled 2011-09-15: qty 2

## 2011-09-15 NOTE — Progress Notes (Signed)
Subjective: He still has some right upper quadrant and right flank pain. After the IV Dilaudid, he became nauseated, but did not vomit.  Objective: Vital signs in last 24 hours: Filed Vitals:   09/14/11 1456 09/14/11 1712 09/14/11 2136 09/15/11 0559  BP: 119/65 111/65 120/71 141/83  Pulse: 70 68 69 71  Temp:  98.4 F (36.9 C) 98.1 F (36.7 C) 97.4 F (36.3 C)  TempSrc:  Oral Oral Oral  Resp:  20 20 20   Height:  6\' 1"  (1.854 m)    Weight:  116.5 kg (256 lb 13.4 oz)    SpO2: 96% 95% 94% 91%    Intake/Output Summary (Last 24 hours) at 09/15/11 0938 Last data filed at 09/15/11 0643  Gross per 24 hour  Intake 1558.33 ml  Output      0 ml  Net 1558.33 ml    Weight change:   Lungs: Clear to auscultation bilaterally. Heart: S1, S2, with no murmurs rubs or gallops. Abdomen: Obese, positive bowel sounds, soft, mildly to moderately tender in the right upper quadrant and right flank area. No rigidity, no voluntary guarding, no distention. Extremities: No pedal edema.   L in andab Results: Basic Metabolic Panel:  Basename 09/15/11 0501 09/14/11 0731  NA 139 134*  K 4.4 3.7  CL 108 100  CO2 24 21  GLUCOSE 82 209*  BUN 17 19  CREATININE 1.16 1.14  CALCIUM 8.8 9.2  MG -- --  PHOS -- --   Liver Function Tests:  [facility] 09/14/11 0731  AST 11  ALT 13  ALKPHOS 83  BILITOT 0.6  PROT 7.7  ALBUMIN 3.7    Basename 09/15/11 0501 09/14/11 0731  LIPASE 33 62*  AMYLASE -- --   No results found for this basename: AMMONIA:2 in the last 72 hours CBC:  Basename 09/15/11 0501 09/14/11 0731  WBC 8.3 11.8*  NEUTROABS -- 8.3*  HGB 13.6 15.4  HCT 40.1 43.8  MCV 89.7 88.3  PLT 185 181   Cardiac Enzymes: No results found for this basename: CKTOTAL:3,CKMB:3,CKMBINDEX:3,TROPONINI:3 in the last 72 hours BNP: No results found for this basename: PROBNP:3 in the last 72 hours D-Dimer: No results found for this basename: DDIMER:2 in the last 72 hours CBG:  Basename 09/15/11  0623 09/14/11 2354 09/14/11 1827  GLUCAP 81 84 89   Hemoglobin A1C: No results found for this basename: HGBA1C in the last 72 hours Fasting Lipid Panel:  Basename 09/15/11 0501  CHOL --  HDL --  LDLCALC --  TRIG 204*  CHOLHDL --  LDLDIRECT --   Thyroid Function Tests: No results found for this basename: TSH,T4TOTAL,FREET4,T3FREE,THYROIDAB in the last 72 hours Anemia Panel: No results found for this basename: VITAMINB12,FOLATE,FERRITIN,TIBC,IRON,RETICCTPCT in the last 72 hours Coagulation: No results found for this basename: LABPROT:2,INR:2 in the last 72 hours Urine Drug Screen: Drugs of Abuse  No results found for this basename: labopia, cocainscrnur, labbenz, amphetmu, thcu, labbarb    Alcohol Level: No results found for this basename: ETH:2 in the last 72 hours Urinalysis:  Basename 09/14/11 0851  COLORURINE YELLOW  LABSPEC 1.005  PHURINE 6.0  GLUCOSEU NEGATIVE  HGBUR NEGATIVE  BILIRUBINUR NEGATIVE  KETONESUR NEGATIVE  PROTEINUR NEGATIVE  UROBILINOGEN 0.2  NITRITE NEGATIVE  LEUKOCYTESUR NEGATIVE   Misc. Labs:   MInicro: No results found for this or any previous visit (from the past 240 hour(s)).  Studies/Results: Ct Abdomen Pelvis W Contrast  09/14/2011  *RADIOLOGY REPORT*  Clinical Data: Diffuse abdominal pain for 5 days.  Question colitis.  CT ABDOMEN AND PELVIS WITH CONTRAST  Technique:  Multidetector CT imaging of the abdomen and pelvis was performed following the standard protocol during bolus administration of intravenous contrast.  Contrast: OMNIPAQUE IOHEXOL 300 MG/ML  SOLN  Comparison: None.  Findings: Clear lung bases.  Mild cardiomegaly without pericardial or pleural effusion.  Distal right coronary artery atherosclerosis on image 9.  Hepatomegaly, 19.6 cm cranial caudal.  No focal liver lesions.  A small splenule.  Normal stomach.  There is edema within the anterior pararenal space. This is centered around the pancreatic tail, including on images  31 - 34.  There is concurrent mild soft tissue fullness within this area of the pancreas.  Surrounding vasculature is patent.  No peripancreatic fluid collection.  A gallstone without acute cholecystitis or biliary ductal dilatation.  Normal adrenal glands.  Moderate right renal cortical atrophy.  A lower pole right renal calculus.  Left renal cysts and right renal too small to characterize lesions.  The proximal ureters are mildly prominent.  This is felt to be related to bladder distention.  No obstructive cause identified.  1.7 cm porta hepatis node on image 24.  A portacaval node measures 1.8 cm on image 32.  Normal colon, appendix, and terminal ileum.  Normal small bowel without abdominal ascites.  The infrarenal abdominal aorta is nonaneurysmally dilated up to 2.4 cm.  The right common iliac artery is mildly aneurysmal 1.6 cm on image 59. Normal urinary bladder and prostate.  No significant free fluid.  Surgical changes in the lumbar spine.  IMPRESSION:  1.  Edema surrounding the pancreatic tail, consistent with pancreatitis.  Concurrent soft tissue fullness in this region of the pancreas, likely also related to pancreatitis.  Correlate with pancreatic enzyme levels.  Depending on clinical concern, pre and post contrast abdominal MRI at approximately 6 weeks should be considered to exclude underlying occult obstructive pancreatic lesion. 2.  Cholelithiasis without cholecystitis. 3.  Borderline/mild porta hepatis adenopathy.  Concurrent hepatomegaly.  This is nonspecific.   Correlate with risk factors for hepatitis or hepatic steatosis. 4.  Right renal atrophy with lower pole right renal calculus. 5.  Right common iliac artery aneurysm with non aneurysmal dilatation of the infrarenal aorta.  Original Report Authenticated By: Consuello Bossier, M.D.    Medications: and Scheduled:   . enoxaparin  40 mg Subcutaneous Q24H  . nicotine  21 mg Transdermal Daily   Continuous:   . 0.9 % NaCl with KCl 20 mEq / L  125 mL/hr at 09/15/11 0202  . DISCONTD: sodium chloride 125 mL/hr at 09/14/11 0844  . DISCONTD: sodium chloride     ZOX:[id number], acetaminophen, alum & mag hydroxide-simeth, morphine injection, ondansetron (ZOFRAN) IV, ondansetron, senna-docusate, zolpidem, DISCONTD:  HYDROmorphone (DILAUDID) injection, DISCONTD: morphine, DISCONTD: morphine, DISCONTD: ondansetron (ZOFRAN) IV, DISCONTD: ondansetron (ZOFRAN) IV  Assessment: Principal Problem:  *Acute pancreatitis Active Problems:  Cholelithiasis  Hyperglycemia  Current every day smoker  Atrophy of right kidney  Kidney calculi   1. Acute pancreatitis, likely secondary to cholelithiasis; likely passed gallstone. His triglyceride level is only marginally elevated. He denies alcohol use. Agree that he will need an elective cholecystectomy although later.  Hyperglycemia, resolved.  Radiographic findings of right kidney atrophy and right renal calculus.  Tobacco abuse. Nicotine patch ordered.   Plan:  1. Continue current management. 2. Will add sips of clear liquids. 3. Tobacco cessation counseling. 4. We'll check a blood lipase in the morning.   LOS: 1 day  Stefania Goulart 09/15/2011, 9:38 AM

## 2011-09-16 ENCOUNTER — Encounter (HOSPITAL_COMMUNITY): Payer: Self-pay | Admitting: Internal Medicine

## 2011-09-16 DIAGNOSIS — K859 Acute pancreatitis without necrosis or infection, unspecified: Secondary | ICD-10-CM

## 2011-09-16 DIAGNOSIS — K8020 Calculus of gallbladder without cholecystitis without obstruction: Secondary | ICD-10-CM

## 2011-09-16 DIAGNOSIS — E785 Hyperlipidemia, unspecified: Secondary | ICD-10-CM

## 2011-09-16 LAB — COMPREHENSIVE METABOLIC PANEL
ALT: 12 U/L (ref 0–53)
AST: 11 U/L (ref 0–37)
Albumin: 3.4 g/dL — ABNORMAL LOW (ref 3.5–5.2)
Alkaline Phosphatase: 77 U/L (ref 39–117)
Chloride: 107 mEq/L (ref 96–112)
Potassium: 4.1 mEq/L (ref 3.5–5.1)
Sodium: 137 mEq/L (ref 135–145)
Total Bilirubin: 0.6 mg/dL (ref 0.3–1.2)
Total Protein: 7.6 g/dL (ref 6.0–8.3)

## 2011-09-16 LAB — GLUCOSE, CAPILLARY
Glucose-Capillary: 83 mg/dL (ref 70–99)
Glucose-Capillary: 84 mg/dL (ref 70–99)

## 2011-09-16 MED ORDER — SENNOSIDES-DOCUSATE SODIUM 8.6-50 MG PO TABS: 1.0000 | ORAL_TABLET | Freq: Every evening | ORAL | Status: DC | PRN

## 2011-09-16 NOTE — Discharge Summary (Signed)
Physician Discharge Summary  Shaun Pugh MRN: [id number] DOB/AGE: 1951-01-23 61 y.o.  PCP: [name], MD, MD   Admit date: 09/14/2011 Discharge date: 09/16/2011  Discharge Diagnoses:  1. Acute pancreatitis, likely secondary to gallstones. 2. Cholelithiasis without radiographic evidence of cholecystitis. 3. Hepatomegaly. 4. Atrophy of the right kidney. 5. Right renal calculus. 6. Nonfasting hyperglycemia on admission, resolved. 7. Tobacco abuse. The patient was advised to stop smoking. 8. Elevated blood pressure but without sustained hypertension. 9. Mild right iliac artery aneurysm, measuring 1.6 cm.   Medication List  As of 09/16/2011  4:09 PM   STOP taking these medications         EX-LAX PO      ibuprofen 200 MG tablet         TAKE these medications         senna-docusate 8.6-50 MG per tablet   Commonly known as: Senokot-S   Take 1 tablet by mouth at bedtime as needed for constipation.            Discharge Condition: improved.  Disposition:  home.    Consults: none.   Significant Diagnostic Studies: Ct Abdomen Pelvis W Contrast  09/14/2011  *RADIOLOGY REPORT*  Clinical Data: Diffuse abdominal pain for 5 days.  Question colitis.  CT ABDOMEN AND PELVIS WITH CONTRAST  Technique:  Multidetector CT imaging of the abdomen and pelvis was performed following the standard protocol during bolus administration of intravenous contrast.  Contrast: OMNIPAQUE IOHEXOL 300 MG/ML  SOLN  Comparison: None.  Findings: Clear lung bases.  Mild cardiomegaly without pericardial or pleural effusion.  Distal right coronary artery atherosclerosis on image 9.  Hepatomegaly, 19.6 cm cranial caudal.  No focal liver lesions.  A small splenule.  Normal stomach.  There is edema within the anterior pararenal space. This is centered around the pancreatic tail, including on images 31 - 34.  There is concurrent mild soft tissue fullness within this area of the pancreas.  Surrounding  vasculature is patent.  No peripancreatic fluid collection.  A gallstone without acute cholecystitis or biliary ductal dilatation.  Normal adrenal glands.  Moderate right renal cortical atrophy.  A lower pole right renal calculus.  Left renal cysts and right renal too small to characterize lesions.  The proximal ureters are mildly prominent.  This is felt to be related to bladder distention.  No obstructive cause identified.  1.7 cm porta hepatis node on image 24.  A portacaval node measures 1.8 cm on image 32.  Normal colon, appendix, and terminal ileum.  Normal small bowel without abdominal ascites.  The infrarenal abdominal aorta is nonaneurysmally dilated up to 2.4 cm.  The right common iliac artery is mildly aneurysmal 1.6 cm on image 59. Normal urinary bladder and prostate.  No significant free fluid.  Surgical changes in the lumbar spine.  IMPRESSION:  1.  Edema surrounding the pancreatic tail, consistent with pancreatitis.  Concurrent soft tissue fullness in this region of the pancreas, likely also related to pancreatitis.  Correlate with pancreatic enzyme levels.  Depending on clinical concern, pre and post contrast abdominal MRI at approximately 6 weeks should be considered to exclude underlying occult obstructive pancreatic lesion. 2.  Cholelithiasis without cholecystitis. 3.  Borderline/mild porta hepatis adenopathy.  Concurrent hepatomegaly.  This is nonspecific.   Correlate with risk factors for hepatitis or hepatic steatosis. 4.  Right renal atrophy with lower pole right renal calculus. 5.  Right common iliac artery aneurysm with non aneurysmal dilatation of the infrarenal  aorta.  Original Report Authenticated By: Consuello Bossier, M.D.     Microbiology: No results found for this or any previous visit (from the past 240 hour(s)).   Labs: Results for orders placed during the hospital encounter of 09/14/11 (from the past 48 hour(s))  GLUCOSE, CAPILLARY     Status: Normal   Collection Time    09/14/11  6:27 PM      Component Value Range Comment   Glucose-Capillary 89  70 - 99 (mg/dL)    Comment 1 Notify RN      Comment 2 Documented in Chart     GLUCOSE, CAPILLARY     Status: Normal   Collection Time   09/14/11 11:54 PM      Component Value Range Comment   Glucose-Capillary 84  70 - 99 (mg/dL)   BASIC METABOLIC PANEL     Status: Abnormal   Collection Time   09/15/11  5:01 AM      Component Value Range Comment   Sodium 139  135 - 145 (mEq/L)    Potassium 4.4  3.5 - 5.1 (mEq/L)    Chloride 108  96 - 112 (mEq/L)    CO2 24  19 - 32 (mEq/L)    Glucose, Bld 82  70 - 99 (mg/dL)    BUN 17  6 - 23 (mg/dL)    Creatinine, Ser 4.54  0.50 - 1.35 (mg/dL)    Calcium 8.8  8.4 - 10.5 (mg/dL)    GFR calc non Af Amer 66 (*) >90 (mL/min)    GFR calc Af Amer 77 (*) >90 (mL/min)   CBC     Status: Normal   Collection Time   09/15/11  5:01 AM      Component Value Range Comment   WBC 8.3  4.0 - 10.5 (K/uL)    RBC 4.47  4.22 - 5.81 (MIL/uL)    Hemoglobin 13.6  13.0 - 17.0 (g/dL)    HCT 09.8  11.9 - 14.7 (%)    MCV 89.7  78.0 - 100.0 (fL)    MCH 30.4  26.0 - 34.0 (pg)    MCHC 33.9  30.0 - 36.0 (g/dL)    RDW 82.9  56.2 - 13.0 (%)    Platelets 185  150 - 400 (K/uL)   LIPASE, BLOOD     Status: Normal   Collection Time   09/15/11  5:01 AM      Component Value Range Comment   Lipase 33  11 - 59 (U/L)   HEMOGLOBIN A1C     Status: Abnormal   Collection Time   09/15/11  5:01 AM      Component Value Range Comment   Hemoglobin A1C 5.8 (*) <5.7 (%)    Mean Plasma Glucose 120 (*) <117 (mg/dL)   TRIGLYCERIDES     Status: Abnormal   Collection Time   09/15/11  5:01 AM      Component Value Range Comment   Triglycerides 204 (*) <150 (mg/dL)   GLUCOSE, CAPILLARY     Status: Normal   Collection Time   09/15/11  6:23 AM      Component Value Range Comment   Glucose-Capillary 81  70 - 99 (mg/dL)   GLUCOSE, CAPILLARY     Status: Normal   Collection Time   09/15/11 11:08 AM      Component Value Range Comment    Glucose-Capillary 88  70 - 99 (mg/dL)    Comment 1 Notify RN     GLUCOSE,  CAPILLARY     Status: Normal   Collection Time   09/15/11  6:06 PM      Component Value Range Comment   Glucose-Capillary 85  70 - 99 (mg/dL)    Comment 1 Notify RN      Comment 2 Documented in Chart     GLUCOSE, CAPILLARY     Status: Normal   Collection Time   09/16/11 12:03 AM      Component Value Range Comment   Glucose-Capillary 83  70 - 99 (mg/dL)   COMPREHENSIVE METABOLIC PANEL     Status: Abnormal   Collection Time   09/16/11  5:14 AM      Component Value Range Comment   Sodium 137  135 - 145 (mEq/L)    Potassium 4.1  3.5 - 5.1 (mEq/L)    Chloride 107  96 - 112 (mEq/L)    CO2 21  19 - 32 (mEq/L)    Glucose, Bld 85  70 - 99 (mg/dL)    BUN 14  6 - 23 (mg/dL)    Creatinine, Ser 1.61  0.50 - 1.35 (mg/dL)    Calcium 9.2  8.4 - 10.5 (mg/dL)    Total Protein 7.6  6.0 - 8.3 (g/dL)    Albumin 3.4 (*) 3.5 - 5.2 (g/dL)    AST 11  0 - 37 (U/L)    ALT 12  0 - 53 (U/L)    Alkaline Phosphatase 77  39 - 117 (U/L)    Total Bilirubin 0.6  0.3 - 1.2 (mg/dL)    GFR calc non Af Amer 79 (*) >90 (mL/min)    GFR calc Af Amer >90  >90 (mL/min)   LIPASE, BLOOD     Status: Normal   Collection Time   09/16/11  5:14 AM      Component Value Range Comment   Lipase 27  11 - 59 (U/L)   GLUCOSE, CAPILLARY     Status: Normal   Collection Time   09/16/11  6:05 AM      Component Value Range Comment   Glucose-Capillary 84  70 - 99 (mg/dL)   GLUCOSE, CAPILLARY     Status: Abnormal   Collection Time   09/16/11 11:09 AM      Component Value Range Comment   Glucose-Capillary 128 (*) 70 - 99 (mg/dL)    Comment 1 Notify RN        HPI : The patient is a 61 year old man with a history of hyperlipidemia and remote history of hypertension, who presented to the emergency department on Sep 14, 2011 with a chief complaint of abdominal pain and nausea. In the emergency department, he was noted to be afebrile and hemodynamically stable. His lab data  were significant for a serum sodium of 134, glucose of 209, normal liver transaminases, lipase of 62, WBC of 11.8, and a negative urinalysis. CT scan of his abdomen and pelvis revealed edema surrounding the pancreatic tail consistent with pancreatitis, concurrent soft tissue fullness in the region of the pancreas, cholelithiasis without cholecystitis, concurrent hepatomegaly, right renal atrophy with lower right pole renal calculus, and right common iliac artery aneurysm with non-aneurysmal dilatation of the infrarenal aorta. He was admitted for further evaluation and management.  HOSPITAL COURSE: The patient was made to be n.p.o. for bowel rest. IV fluids were started for hydration. His pain was treated with as needed analgesics. His nausea was treated with as needed antiemetics. A nicotine patch was placed for tobacco replacement therapy. He was  advised to stop smoking indefinitely. His lipase was followed daily. For further evaluation, a triglyceride level was ordered. It was only marginally elevated 204. He denied alcohol use or abuse.  Based on the CT scan of his abdomen and pelvis, there was no evidence of biliary dilatation and no evidence of choledocholithiasis. His liver transaminases were well within normal limits. It was likely that he passed a gallstone. Over the course of the hospitalization, his lipase level normalized. His pain subsided. He continued to have nausea, but this was felt to be secondary to IV hydromorphone. It was discontinued in favor of IV morphine. Eventually his nausea subsided and then it resolved. His diet was advanced which he tolerated well.  The patient's blood pressure did increase to the 160s to the 180s systolically for 1-1/2 days. He has a remote history of hypertension, however, he stopped taking his medications by choice. His blood pressure normalized prior to discharge. It was believed that his elevated blood pressures were pain mediated.  It was decided that the  patient would be referred to a general surgeon as an outpatient for evaluation of an elective cholecystectomy. An appointment was made for him to followup with general surgeon, Dr. Margaretmary Bayley in one week. The patient was discharged to home in improved and stable condition.   Discharge Exam: See previous dictation. Blood pressure 134/72, pulse 72, temperature 97.9 F (36.6 C), temperature source Oral, resp. rate 20, height 6\' 1"  (1.854 m), weight 116.5 kg (256 lb 13.4 oz), SpO2 97.00%.     Discharge Orders    Future Orders Please Complete By Expires   Diet general      Increase activity slowly      Discharge instructions      Comments:   FOLLOW A LOW FAT DIET.      Follow-up Information    Follow up with Fabio Bering, MD on 09/22/2011. (AT 10:30 AM)    Contact information:   [address] [place] [address] [phone]       Schedule an appointment as soon as possible for a visit with HALL,ZACK, MD. (In 2 weeks)    Contact information:   [address] [place] [address] [phone]          Discharge time: 30 minutes.  Signed: Jayko Voorhees 09/16/2011, 4:09 PM

## 2011-09-16 NOTE — Progress Notes (Signed)
Patient received discharge instructions along with follow up appointments. Patient verbalized understanding of all instructions. Patient was escorted by staff via wheelchair to vehicle. Patient discharged to home in stable condition. 

## 2011-09-16 NOTE — Progress Notes (Signed)
Subjective: The patient denies ongoing abdominal pain. No nausea. He was to try solid food.  Objective: Vital signs in last 24 hours: Filed Vitals:   09/15/11 0559 09/15/11 1404 09/15/11 2051 09/16/11 0606  BP: 141/83 154/82 149/84 180/99  Pulse: 71 64 71 74  Temp: 97.4 F (36.3 C) 97.5 F (36.4 C) 97.9 F (36.6 C) 97.7 F (36.5 C)  TempSrc: Oral  Oral Oral  Resp: 20 20 20 20   Height:      Weight:      SpO2: 91% 96% 97% 95%    Intake/Output Summary (Last 24 hours) at 09/16/11 1039 Last data filed at 09/16/11 0649  Gross per 24 hour  Intake 2537.92 ml  Output   1975 ml  Net 562.92 ml    Weight change:   Lungs: Clear to auscultation bilaterally. Heart: S1, S2, with no murmurs rubs or gallops. Abdomen: Obese, positive bowel sounds, nontender, nondistended.. No rigidity, no voluntary guarding, no distention. Extremities: No pedal edema.   L in andab Results: Basic Metabolic Panel:  Basename 09/16/11 0514 09/15/11 0501  NA 137 139  K 4.1 4.4  CL 107 108  CO2 21 24  GLUCOSE 85 82  BUN 14 17  CREATININE 1.00 1.16  CALCIUM 9.2 8.8  MG -- --  PHOS -- --   Liver Function Tests:  Basename 09/16/11 0514 09/14/11 0731  AST 11 11  ALT 12 13  ALKPHOS 77 83  BILITOT 0.6 0.6  PROT 7.6 7.7  ALBUMIN 3.4* 3.7    Basename 09/16/11 0514 09/15/11 0501  LIPASE 27 33  AMYLASE -- --   No results found for this basename: AMMONIA:2 in the last 72 hours CBC:  Basename 09/15/11 0501 09/14/11 0731  WBC 8.3 11.8*  NEUTROABS -- 8.3*  HGB 13.6 15.4  HCT 40.1 43.8  MCV 89.7 88.3  PLT 185 181   Cardiac Enzymes: No results found for this basename: CKTOTAL:3,CKMB:3,CKMBINDEX:3,TROPONINI:3 in the last 72 hours BNP: No results found for this basename: PROBNP:3 in the last 72 hours D-Dimer: No results found for this basename: DDIMER:2 in the last 72 hours CBG:  Basename 09/16/11 0605 09/16/11 0003 09/15/11 1806 09/15/11 1108 09/15/11 0623 09/14/11 2354  GLUCAP 84 83 85 88  81 84   Hemoglobin A1C:  Basename 09/15/11 0501  HGBA1C 5.8*   Fasting Lipid Panel:  Basename 09/15/11 0501  CHOL --  HDL --  LDLCALC --  TRIG 204*  CHOLHDL --  LDLDIRECT --   Thyroid Function Tests: No results found for this basename: TSH,T4TOTAL,FREET4,T3FREE,THYROIDAB in the last 72 hours Anemia Panel: No results found for this basename: VITAMINB12,FOLATE,FERRITIN,TIBC,IRON,RETICCTPCT in the last 72 hours Coagulation: No results found for this basename: LABPROT:2,INR:2 in the last 72 hours Urine Drug Screen: Drugs of Abuse  No results found for this basename: labopia,  cocainscrnur,  labbenz,  amphetmu,  thcu,  labbarb    Alcohol Level: No results found for this basename: ETH:2 in the last 72 hours Urinalysis:  Basename 09/14/11 0851  COLORURINE YELLOW  LABSPEC 1.005  PHURINE 6.0  GLUCOSEU NEGATIVE  HGBUR NEGATIVE  BILIRUBINUR NEGATIVE  KETONESUR NEGATIVE  PROTEINUR NEGATIVE  UROBILINOGEN 0.2  NITRITE NEGATIVE  LEUKOCYTESUR NEGATIVE   Misc. Labs:   MInicro: No results found for this or any previous visit (from the past 240 hour(s)).  Studies/Results: No results found.  Medications: and Scheduled:    . enoxaparin  40 mg Subcutaneous Q24H  . nicotine  21 mg Transdermal Daily   Continuous:    .  0.9 % NaCl with KCl 20 mEq / L 90 mL/hr at 09/16/11 0610   UJW:JXBJYNWGNFAOZ, acetaminophen, alum & mag hydroxide-simeth, morphine injection, ondansetron (ZOFRAN) IV, ondansetron, senna-docusate, zolpidem  Assessment: Principal Problem:  *Acute pancreatitis Active Problems:  Cholelithiasis  Hyperglycemia  Current every day smoker  Atrophy of right kidney  Kidney calculi   1. Acute pancreatitis, likely secondary to cholelithiasis; likely passed gallstone. His triglyceride level is only marginally elevated. He denies alcohol use. Agree that he will need an elective cholecystectomy although later. His lipase has normalized and he is less  symptomatic.  Elevated blood pressure. The patient's blood pressure has increased progressively. He had been treated for hypertension in the past, but according to his history, his blood pressure normalized. This will be monitored following a decrease in the rate of IV fluids.  Hyperglycemia, resolved. His hemoglobin A1c is 5.8.  Radiographic findings of right kidney atrophy and right renal calculus.  Tobacco abuse. Nicotine patch ordered.   Plan:  1. Decreased IV fluids. 2. Advance his diet to a full liquid diet and then a low-fat diet as tolerated. 3. Consider discharge later this afternoon/early evening if he is able to tolerate solid foods. 4. We'll make an outpatient appointment with Gen. surgery.   LOS: 2 days   Jakera Beaupre 09/16/2011, 10:39 AM

## 2011-09-22 NOTE — Patient Instructions (Signed)
[address] [place]  09/22/2011   Your procedure is scheduled on:  5.17.13  Report to [facility] at 0840 AM.  Call this number if you have problems the morning of surgery: [phone]   Remember:   Do not eat food:After Midnight.  May have clear liquids:until Midnight .  Clear liquids include soda, tea, black coffee, apple or grape juice, broth.  Take these medicines the morning of surgery with A SIP OF WATER: none   Do not wear jewelry, make-up or nail polish.  Do not wear lotions, powders, or perfumes. You may wear deodorant.  Do not shave 48 hours prior to surgery. Men may shave face and neck.  Do not bring valuables to the hospital.  Contacts, dentures or bridgework may not be worn into surgery.  Leave suitcase in the car. After surgery it may be brought to your room.  For patients admitted to the hospital, checkout time is 11:00 AM the day of discharge.   Patients discharged the day of surgery will not be allowed to drive home.  Name and phone number of your driver: family  Special Instructions: CHG Shower Use Special Wash: 1/2 bottle night before surgery and 1/2 bottle morning of surgery.   Please read over the following fact sheets that you were given: Pain Booklet, MRSA Information, Surgical Site Infection Prevention, Anesthesia Post-op Instructions and Care and Recovery After Surgery   PATIENT INSTRUCTIONS POST-ANESTHESIA  IMMEDIATELY FOLLOWING SURGERY:  Do not drive or operate machinery for the first twenty four hours after surgery.  Do not make any important decisions for twenty four hours after surgery or while taking narcotic pain medications or sedatives.  If you develop intractable nausea and vomiting or a severe headache please notify your doctor immediately.  FOLLOW-UP:  Please make an appointment with your surgeon as instructed. You do not need to follow up with anesthesia unless specifically instructed to do so.  WOUND CARE INSTRUCTIONS (if applicable):  Keep a dry  clean dressing on the anesthesia/puncture wound site if there is drainage.  Once the wound has quit draining you may leave it open to air.  Generally you should leave the bandage intact for twenty four hours unless there is drainage.  If the epidural site drains for more than 36-48 hours please call the anesthesia department.  QUESTIONS?:  Please feel free to call your physician or the hospital operator if you have any questions, and they will be happy to assist you.      Laparoscopic Cholecystectomy Laparoscopic cholecystectomy is surgery to remove the gallbladder. The gallbladder is located slightly to the right of center in the abdomen, behind the liver. It is a concentrating and storage sac for the bile produced in the liver. Bile aids in the digestion and absorption of fats. Gallbladder disease (cholecystitis) is an inflammation of your gallbladder. This condition is usually caused by a buildup of gallstones (cholelithiasis) in your gallbladder. Gallstones can block the flow of bile, resulting in inflammation and pain. In severe cases, emergency surgery may be required. When emergency surgery is not required, you will have time to prepare for the procedure. Laparoscopic surgery is an alternative to open surgery. Laparoscopic surgery usually has a shorter recovery time. Your common bile duct may also need to be examined and explored. Your caregiver will discuss this with you if he or she feels this should be done. If stones are found in the common bile duct, they may be removed. LET YOUR CAREGIVER KNOW ABOUT:  Allergies  to food or medicine.   Medicines taken, including vitamins, herbs, eyedrops, over-the-counter medicines, and creams.   Use of steroids (by mouth or creams).   Previous problems with anesthetics or numbing medicines.   History of bleeding problems or blood clots.   Previous surgery.   Other health problems, including diabetes and kidney problems.   Possibility of pregnancy,  if this applies.  RISKS AND COMPLICATIONS All surgery is associated with risks. Some problems that may occur following this procedure include:  Infection.   Damage to the common bile duct, nerves, arteries, veins, or other internal organs such as the stomach or intestines.   Bleeding.   A stone may remain in the common bile duct.  BEFORE THE PROCEDURE  Do not take aspirin for 3 days prior to surgery or blood thinners for 1 week prior to surgery.   Do not eat or drink anything after midnight the night before surgery.   Let your caregiver know if you develop a cold or other infectious problem prior to surgery.   You should be present 60 minutes before the procedure or as directed.  PROCEDURE  You will be given medicine that makes you sleep (general anesthetic). When you are asleep, your surgeon will make several small cuts (incisions) in your abdomen. One of these incisions is used to insert a small, lighted scope (laparoscope) into the abdomen. The laparoscope helps the surgeon see into your abdomen. Carbon dioxide gas will be pumped into your abdomen. The gas allows more room for the surgeon to perform your surgery. Other operating instruments are inserted through the other incisions. Laparoscopic procedures may not be appropriate when:  There is major scarring from previous surgery.   The gallbladder is extremely inflamed.   There are bleeding disorders or unexpected cirrhosis of the liver.   A pregnancy is near term.   Other conditions make the laparoscopic procedure impossible.  If your surgeon feels it is not safe to continue with a laparoscopic procedure, he or she will perform an open abdominal procedure. In this case, the surgeon will make an incision to open the abdomen. This gives the surgeon a larger view and field to work within. This may allow the surgeon to perform procedures that sometimes cannot be performed with a laparoscope alone. Open surgery has a longer recovery  time. AFTER THE PROCEDURE  You will be taken to the recovery area where a nurse will watch and check your progress.   You may be allowed to go home the same day.   Do not resume physical activities until directed by your caregiver.   You may resume a normal diet and activities as directed.  Document Released: 04/27/2005 Document Revised: 04/16/2011 Document Reviewed: 10/10/2010 [facility] Patient Information 2012 [place], [place].

## 2011-09-23 ENCOUNTER — Encounter (HOSPITAL_COMMUNITY)
Admission: RE | Admit: 2011-09-23 | Discharge: 2011-09-23 | Disposition: A | Discharge status: Home / Self Care | Payer: Medicare Other | Source: Ambulatory Visit | Attending: General Surgery | Admitting: General Surgery

## 2011-09-23 ENCOUNTER — Other Ambulatory Visit: Payer: Self-pay

## 2011-09-23 ENCOUNTER — Encounter (HOSPITAL_COMMUNITY): Payer: Self-pay | Admitting: Pharmacy Technician

## 2011-09-23 ENCOUNTER — Encounter (HOSPITAL_COMMUNITY): Payer: Self-pay

## 2011-09-23 HISTORY — DX: Dorsalgia, unspecified: M54.9

## 2011-09-23 HISTORY — DX: Acute myocardial infarction, unspecified: I21.9

## 2011-09-23 HISTORY — DX: Other chronic pain: G89.29

## 2011-09-23 HISTORY — DX: Sleep apnea, unspecified: G47.30

## 2011-09-23 LAB — DIFFERENTIAL
Basophils Absolute: 0.1 10*3/uL (ref 0.0–0.1)
Eosinophils Relative: 3 % (ref 0–5)
Lymphocytes Relative: 26 % (ref 12–46)
Monocytes Absolute: 1.1 10*3/uL — ABNORMAL HIGH (ref 0.1–1.0)

## 2011-09-23 LAB — BASIC METABOLIC PANEL
BUN: 15 mg/dL (ref 6–23)
CO2: 25 mEq/L (ref 19–32)
Chloride: 105 mEq/L (ref 96–112)
Creatinine, Ser: 1.11 mg/dL (ref 0.50–1.35)
Glucose, Bld: 140 mg/dL — ABNORMAL HIGH (ref 70–99)

## 2011-09-23 LAB — CBC
HCT: 41.4 % (ref 39.0–52.0)
MCH: 30.2 pg (ref 26.0–34.0)
MCHC: 33.8 g/dL (ref 30.0–36.0)
MCV: 89.4 fL (ref 78.0–100.0)
RDW: 13.3 % (ref 11.5–15.5)
WBC: 12 10*3/uL — ABNORMAL HIGH (ref 4.0–10.5)

## 2011-09-23 NOTE — Progress Notes (Signed)
09/23/11 1412  OBSTRUCTIVE SLEEP APNEA  Have you ever been diagnosed with sleep apnea through a sleep study? No  Do you snore loudly (loud enough to be heard through closed doors)?  1  Do you often feel tired, fatigued, or sleepy during the daytime? 1  Has anyone observed you stop breathing during your sleep? 0  Do you have, or are you being treated for high blood pressure? 0  BMI more than 35 kg/m2? 1  Age over 62 years old? 1  Neck circumference greater than 40 cm/18 inches? 1  Gender: 1  Obstructive Sleep Apnea Score 6   Score 4 or greater  Updated health history;Results sent to PCP

## 2011-09-25 ENCOUNTER — Ambulatory Visit (HOSPITAL_COMMUNITY): Payer: Medicare Other | Admitting: Anesthesiology

## 2011-09-25 ENCOUNTER — Encounter (HOSPITAL_COMMUNITY): Payer: Self-pay | Admitting: Anesthesiology

## 2011-09-25 ENCOUNTER — Encounter (HOSPITAL_COMMUNITY)
Admission: RE | Disposition: A | Discharge status: Home / Self Care | Payer: Self-pay | Source: Ambulatory Visit | Attending: General Surgery

## 2011-09-25 ENCOUNTER — Encounter (HOSPITAL_COMMUNITY): Payer: Self-pay | Admitting: *Deleted

## 2011-09-25 ENCOUNTER — Ambulatory Visit (HOSPITAL_COMMUNITY)
Admission: RE | Admit: 2011-09-25 | Discharge: 2011-09-25 | Disposition: A | Discharge status: Home / Self Care | Payer: Medicare Other | Source: Ambulatory Visit | Attending: General Surgery | Admitting: General Surgery

## 2011-09-25 DIAGNOSIS — Z01812 Encounter for preprocedural laboratory examination: Secondary | ICD-10-CM | POA: Insufficient documentation

## 2011-09-25 DIAGNOSIS — G4733 Obstructive sleep apnea (adult) (pediatric): Secondary | ICD-10-CM | POA: Insufficient documentation

## 2011-09-25 DIAGNOSIS — K8020 Calculus of gallbladder without cholecystitis without obstruction: Secondary | ICD-10-CM | POA: Insufficient documentation

## 2011-09-25 DIAGNOSIS — K859 Acute pancreatitis without necrosis or infection, unspecified: Secondary | ICD-10-CM | POA: Insufficient documentation

## 2011-09-25 HISTORY — PX: CHOLECYSTECTOMY: SHX55

## 2011-09-25 SURGERY — LAPAROSCOPIC CHOLECYSTECTOMY
Anesthesia: General | Site: Abdomen | Wound class: Contaminated

## 2011-09-25 MED ORDER — ROCURONIUM BROMIDE 100 MG/10ML IV SOLN
INTRAVENOUS | Status: DC | PRN
  Administered 2011-09-25: 5 mg via INTRAVENOUS
  Administered 2011-09-25: 25 mg via INTRAVENOUS

## 2011-09-25 MED ORDER — FENTANYL CITRATE 0.05 MG/ML IJ SOLN
INTRAMUSCULAR | Status: AC
  Administered 2011-09-25: 50 ug via INTRAVENOUS
  Filled 2011-09-25: qty 5

## 2011-09-25 MED ORDER — LIDOCAINE HCL (PF) 1 % IJ SOLN
INTRAMUSCULAR | Status: AC
  Filled 2011-09-25: qty 5

## 2011-09-25 MED ORDER — SUCCINYLCHOLINE CHLORIDE 20 MG/ML IJ SOLN
INTRAMUSCULAR | Status: AC
  Filled 2011-09-25: qty 2

## 2011-09-25 MED ORDER — CEFAZOLIN SODIUM-DEXTROSE 2-3 GM-% IV SOLR
2.0000 g | INTRAVENOUS | Status: AC
  Administered 2011-09-25: 2 g via INTRAVENOUS

## 2011-09-25 MED ORDER — SUCCINYLCHOLINE CHLORIDE 20 MG/ML IJ SOLN
INTRAMUSCULAR | Status: DC | PRN
  Administered 2011-09-25: 120 mg via INTRAVENOUS

## 2011-09-25 MED ORDER — ONDANSETRON HCL 4 MG/2ML IJ SOLN
4.0000 mg | Freq: Once | INTRAMUSCULAR | Status: AC
  Administered 2011-09-25: 4 mg via INTRAVENOUS

## 2011-09-25 MED ORDER — CHLORHEXIDINE GLUCONATE 4 % EX LIQD
1.0000 "application " | Freq: Once | CUTANEOUS | Status: DC
  Filled 2011-09-25: qty 15

## 2011-09-25 MED ORDER — LACTATED RINGERS IV SOLN
INTRAVENOUS | Status: DC
  Administered 2011-09-25: 500 mL via INTRAVENOUS
  Administered 2011-09-25: 1000 mL via INTRAVENOUS

## 2011-09-25 MED ORDER — HEMOSTATIC AGENTS (NO CHARGE) OPTIME
TOPICAL | Status: DC | PRN
  Administered 2011-09-25: 1 via TOPICAL

## 2011-09-25 MED ORDER — GLYCOPYRROLATE 0.2 MG/ML IJ SOLN
INTRAMUSCULAR | Status: AC
  Filled 2011-09-25: qty 1

## 2011-09-25 MED ORDER — CEFAZOLIN SODIUM-DEXTROSE 2-3 GM-% IV SOLR
INTRAVENOUS | Status: AC
  Filled 2011-09-25: qty 50

## 2011-09-25 MED ORDER — SODIUM CHLORIDE 0.9 % IR SOLN
Status: DC | PRN
  Administered 2011-09-25: 3000 mL

## 2011-09-25 MED ORDER — GLYCOPYRROLATE 0.2 MG/ML IJ SOLN
0.2000 mg | Freq: Once | INTRAMUSCULAR | Status: AC
  Administered 2011-09-25: 0.2 mg via INTRAVENOUS

## 2011-09-25 MED ORDER — CELECOXIB 100 MG PO CAPS
ORAL_CAPSULE | ORAL | Status: AC
  Filled 2011-09-25: qty 4

## 2011-09-25 MED ORDER — GLYCOPYRROLATE 0.2 MG/ML IJ SOLN
INTRAMUSCULAR | Status: AC
  Administered 2011-09-25: 0.2 mg via INTRAVENOUS
  Filled 2011-09-25: qty 1

## 2011-09-25 MED ORDER — GLYCOPYRROLATE 0.2 MG/ML IJ SOLN
INTRAMUSCULAR | Status: DC | PRN
  Administered 2011-09-25: 0.4 mg via INTRAVENOUS

## 2011-09-25 MED ORDER — ROCURONIUM BROMIDE 50 MG/5ML IV SOLN
INTRAVENOUS | Status: AC
  Filled 2011-09-25: qty 1

## 2011-09-25 MED ORDER — LIDOCAINE HCL (CARDIAC) 10 MG/ML IV SOLN
INTRAVENOUS | Status: DC | PRN
  Administered 2011-09-25: 10 mg via INTRAVENOUS

## 2011-09-25 MED ORDER — ACETAMINOPHEN 325 MG PO TABS: 325.0000 mg | ORAL_TABLET | ORAL | Status: DC | PRN

## 2011-09-25 MED ORDER — BUPIVACAINE HCL (PF) 0.5 % IJ SOLN
INTRAMUSCULAR | Status: DC | PRN
  Administered 2011-09-25: 10 mL

## 2011-09-25 MED ORDER — SODIUM CHLORIDE 0.9 % IR SOLN
Status: DC | PRN
  Administered 2011-09-25: 1

## 2011-09-25 MED ORDER — CELECOXIB 100 MG PO CAPS: 400.0000 mg | ORAL_CAPSULE | Freq: Every day | ORAL | Status: DC

## 2011-09-25 MED ORDER — FENTANYL CITRATE 0.05 MG/ML IJ SOLN
INTRAMUSCULAR | Status: AC
Start: 2011-09-25 — End: 2011-09-25
  Administered 2011-09-25: 50 ug via INTRAVENOUS
  Filled 2011-09-25: qty 2

## 2011-09-25 MED ORDER — PROPOFOL 10 MG/ML IV EMUL
INTRAVENOUS | Status: AC
  Filled 2011-09-25: qty 20

## 2011-09-25 MED ORDER — ONDANSETRON HCL 4 MG/2ML IJ SOLN
INTRAMUSCULAR | Status: AC
  Administered 2011-09-25: 4 mg via INTRAVENOUS
  Filled 2011-09-25: qty 2

## 2011-09-25 MED ORDER — MIDAZOLAM HCL 2 MG/2ML IJ SOLN
INTRAMUSCULAR | Status: AC
  Administered 2011-09-25: 2 mg via INTRAVENOUS
  Filled 2011-09-25: qty 2

## 2011-09-25 MED ORDER — FENTANYL CITRATE 0.05 MG/ML IJ SOLN
INTRAMUSCULAR | Status: AC
  Administered 2011-09-25: 50 ug via INTRAVENOUS
  Filled 2011-09-25: qty 2

## 2011-09-25 MED ORDER — ONDANSETRON HCL 4 MG/2ML IJ SOLN: 4.0000 mg | Freq: Once | INTRAMUSCULAR | Status: DC | PRN

## 2011-09-25 MED ORDER — FENTANYL CITRATE 0.05 MG/ML IJ SOLN
25.0000 ug | INTRAMUSCULAR | Status: DC | PRN
  Administered 2011-09-25 (×4): 50 ug via INTRAVENOUS

## 2011-09-25 MED ORDER — FENTANYL CITRATE 0.05 MG/ML IJ SOLN
INTRAMUSCULAR | Status: DC | PRN
  Administered 2011-09-25 (×5): 50 ug via INTRAVENOUS

## 2011-09-25 MED ORDER — MIDAZOLAM HCL 2 MG/2ML IJ SOLN
1.0000 mg | INTRAMUSCULAR | Status: DC | PRN
  Administered 2011-09-25 (×2): 2 mg via INTRAVENOUS

## 2011-09-25 MED ORDER — MIDAZOLAM HCL 2 MG/2ML IJ SOLN
INTRAMUSCULAR | Status: AC
  Filled 2011-09-25: qty 2

## 2011-09-25 MED ORDER — PROPOFOL 10 MG/ML IV EMUL
INTRAVENOUS | Status: DC | PRN
  Administered 2011-09-25: 150 mg via INTRAVENOUS

## 2011-09-25 MED ORDER — BUPIVACAINE HCL (PF) 0.5 % IJ SOLN
INTRAMUSCULAR | Status: AC
  Filled 2011-09-25: qty 30

## 2011-09-25 MED ORDER — HYDROCODONE-ACETAMINOPHEN 5-325 MG PO TABS: 1.0000 | ORAL_TABLET | ORAL | Status: AC | PRN

## 2011-09-25 MED ORDER — SUCCINYLCHOLINE CHLORIDE 20 MG/ML IJ SOLN
INTRAMUSCULAR | Status: AC
  Filled 2011-09-25: qty 1

## 2011-09-25 MED ORDER — NEOSTIGMINE METHYLSULFATE 1 MG/ML IJ SOLN
INTRAMUSCULAR | Status: AC
  Filled 2011-09-25: qty 10

## 2011-09-25 MED ORDER — NEOSTIGMINE METHYLSULFATE 1 MG/ML IJ SOLN
INTRAMUSCULAR | Status: DC | PRN
  Administered 2011-09-25: 2 mg via INTRAVENOUS

## 2011-09-25 SURGICAL SUPPLY — 40 items
APL SKNCLS STERI-STRIP NONHPOA (GAUZE/BANDAGES/DRESSINGS) ×1
APPLIER CLIP UNV 5X34 EPIX (ENDOMECHANICALS) ×2 IMPLANT
APR XCLPCLP 20M/L UNV 34X5 (ENDOMECHANICALS) ×1
BAG HAMPER (MISCELLANEOUS) ×2 IMPLANT
BAG SPEC RTRVL LRG 6X4 10 (ENDOMECHANICALS) ×1
BENZOIN TINCTURE PRP APPL 2/3 (GAUZE/BANDAGES/DRESSINGS) ×2 IMPLANT
CLOTH BEACON ORANGE TIMEOUT ST (SAFETY) ×2 IMPLANT
COVER LIGHT HANDLE STERIS (MISCELLANEOUS) ×4 IMPLANT
DECANTER SPIKE VIAL GLASS SM (MISCELLANEOUS) ×2 IMPLANT
DEVICE TROCAR PUNCTURE CLOSURE (ENDOMECHANICALS) ×2 IMPLANT
DURAPREP 26ML APPLICATOR (WOUND CARE) ×2 IMPLANT
ELECT REM PT RETURN 9FT ADLT (ELECTROSURGICAL) ×2
ELECTRODE REM PT RTRN 9FT ADLT (ELECTROSURGICAL) ×1 IMPLANT
FILTER SMOKE EVAC LAPAROSHD (FILTER) ×2 IMPLANT
FORMALIN 10 PREFIL 120ML (MISCELLANEOUS) ×2 IMPLANT
GLOVE BIOGEL PI IND STRL 7.5 (GLOVE) ×1 IMPLANT
GLOVE BIOGEL PI INDICATOR 7.5 (GLOVE) ×1
GLOVE ECLIPSE 6.5 STRL STRAW (GLOVE) ×1 IMPLANT
GLOVE ECLIPSE 7.0 STRL STRAW (GLOVE) ×3 IMPLANT
GLOVE INDICATOR 7.0 STRL GRN (GLOVE) ×2 IMPLANT
GLOVE INDICATOR 7.5 STRL GRN (GLOVE) ×2 IMPLANT
GOWN STRL REIN XL XLG (GOWN DISPOSABLE) ×7 IMPLANT
HEMOSTAT SNOW SURGICEL 2X4 (HEMOSTASIS) ×2 IMPLANT
INST SET LAPROSCOPIC AP (KITS) ×2 IMPLANT
IV NS IRRIG 3000ML ARTHROMATIC (IV SOLUTION) ×2 IMPLANT
KIT ROOM TURNOVER APOR (KITS) ×2 IMPLANT
MANIFOLD NEPTUNE II (INSTRUMENTS) ×2 IMPLANT
NEEDLE INSUFFLATION 14GA 120MM (NEEDLE) ×2 IMPLANT
PACK LAP CHOLE LZT030E (CUSTOM PROCEDURE TRAY) ×2 IMPLANT
PAD ARMBOARD 7.5X6 YLW CONV (MISCELLANEOUS) ×2 IMPLANT
POUCH SPECIMEN RETRIEVAL 10MM (ENDOMECHANICALS) ×2 IMPLANT
SET BASIN LINEN APH (SET/KITS/TRAYS/PACK) ×2 IMPLANT
SET TUBE IRRIG SUCTION NO TIP (IRRIGATION / IRRIGATOR) ×2 IMPLANT
STRIP CLOSURE SKIN 1/2X4 (GAUZE/BANDAGES/DRESSINGS) ×2 IMPLANT
SUT MNCRL AB 4-0 PS2 18 (SUTURE) ×4 IMPLANT
SUT VIC AB 2-0 CT2 27 (SUTURE) ×4 IMPLANT
TROCAR Z-THRD FIOS HNDL 11X100 (TROCAR) ×2 IMPLANT
TROCAR Z-THREAD FIOS 5X100MM (TROCAR) ×2 IMPLANT
TROCAR Z-THREAD OPTICAL 5X100M (TROCAR) ×4 IMPLANT
WARMER LAPAROSCOPE (MISCELLANEOUS) ×2 IMPLANT

## 2011-09-25 NOTE — Anesthesia Procedure Notes (Signed)
Procedure Name: Intubation Date/Time: 09/25/2011 8:48 AM Performed by: Franco Nones Pre-anesthesia Checklist: Patient identified, Patient being monitored, Timeout performed, Emergency Drugs available and Suction available Patient Re-evaluated:Patient Re-evaluated prior to inductionOxygen Delivery Method: Circle System Utilized Preoxygenation: Pre-oxygenation with 100% oxygen Intubation Type: Rapid sequence and Cricoid Pressure applied Laryngoscope Size: Miller and 2 Grade View: Grade I Tube type: Oral Tube size: 7.0 mm Number of attempts: 1 Airway Equipment and Method: stylet Placement Confirmation: ETT inserted through vocal cords under direct vision,  positive ETCO2 and breath sounds checked- equal and bilateral Secured at: 21 cm Tube secured with: Tape Dental Injury: Teeth and Oropharynx as per pre-operative assessment

## 2011-09-25 NOTE — Transfer of Care (Signed)
Immediate Anesthesia Transfer of Care Note  Patient: Shaun Pugh  Procedure(s) Performed: Procedure(s) (LRB): LAPAROSCOPIC CHOLECYSTECTOMY (N/A)  Patient Location: PACU  Anesthesia Type: General  Level of Consciousness: awake  Airway & Oxygen Therapy: Patient Spontanous Breathing and non-rebreather face mask  Post-op Assessment: Report given to PACU RN, Post -op Vital signs reviewed and stable and Patient moving all extremities  Post vital signs: Reviewed and stable  Complications: No apparent anesthesia complications

## 2011-09-25 NOTE — Anesthesia Preprocedure Evaluation (Signed)
Anesthesia Evaluation  Patient identified by MRN, date of birth, ID band Patient awake    Reviewed: Allergy & Precautions, H&P , NPO status , Patient's Chart, lab work & pertinent test results  Airway Mallampati: II TM Distance: >3 FB Neck ROM: Full    Dental  (+) Upper Dentures and Lower Dentures   Pulmonary sleep apnea ,    Pulmonary exam normal       Cardiovascular + Past MI Rhythm:Regular Rate:Normal     Neuro/Psych negative neurological ROS  negative psych ROS   GI/Hepatic negative GI ROS, Neg liver ROS,   Endo/Other  negative endocrine ROS  Renal/GU negative Renal ROS     Musculoskeletal negative musculoskeletal ROS (+)   Abdominal (+) + obese,  Abdomen: soft.    Peds  Hematology negative hematology ROS (+)   Anesthesia Other Findings   Reproductive/Obstetrics                           Anesthesia Physical Anesthesia Plan  ASA: III  Anesthesia Plan: General   Post-op Pain Management:    Induction: Intravenous  Airway Management Planned: Oral ETT  Additional Equipment:   Intra-op Plan:   Post-operative Plan: Extubation in OR  Informed Consent: I have reviewed the patients History and Physical, chart, labs and discussed the procedure including the risks, benefits and alternatives for the proposed anesthesia with the patient or authorized representative who has indicated his/her understanding and acceptance.     Plan Discussed with: CRNA  Anesthesia Plan Comments:         Anesthesia Quick Evaluation

## 2011-09-25 NOTE — Op Note (Signed)
Patient:  Shaun Pugh  DOB:  [birth year]  MRN:  [id number]   Preop Diagnosis:  Gallstone pancreatitis, cholelithiasis   Postop Diagnosis:  The same  Procedure:  Laparoscopic cholecystectomy  Surgeon:  Dr. Tilford Pillar  Anes:  General endotracheal, 0.5% Sensorcaine plain for local  Indications:  Patient is a 61 year old male who presented to my office after an episode of pancreatitis. He was noted to have gallstones in the suspected etiology was gallstone pancreatitis. Risks benefits alternatives a laparoscopic possible open cholecystectomy were discussed at length the patient. Risk including but not limited to bleeding, infection, bile leak, small bowel injury, common bile duct injury, intraoperative and pulmonary events were discussed. Patient's questions were answered the patient consented for the planned procedure.  Procedure note:  Patient was taken to the or was placed in supine position on the or table which time the general anesthetic is a Optician, dispensing. Once patient was asleep he was endotracheally intubated by the nurse anesthetist. At this point his abdomen was prepped with DuraPrep solution and draped in standard fashion. Stab incision was created supraumbilically with 11 blade scalpel with additional dissection down to subcuticular tissue carried out using a Coker clamp. The clamp was utilized to grasp the anterior abdominal fascia and with this anteriorly. A Veress needle is inserted saline drop test is utilized firm intraperitoneal placement and then pneumoperitoneum was initiated. Once sufficient pneumoperitoneum was obtained an 11 mm trochars inserted over laparoscopic allowing visualization the trocar entering into the peritoneal cavity. At this point the inner cannulas removed laps was reinserted there is no evidence of any trocar or Veress needle placement injury. At this point the remaining trochars were placed the 5 mm trocar in the epigastrium, a 5 mm trocar in the midline, and a  5 mm trocar in the right lateral abdominal wall. Patient's placed into a reverse Trendelenburg left lateral decubitus position. It is of the gallbladder was grasped and lifted up and over the right lobe the liver. Blunt dissection is carried out to strip the peritoneal reflection off the infundibulum exposing the cystic duct and cystic artery is entered into the infundibulum. Windows were created behind both the structures. 3 endoclips placed proximally one distally on the cystic duct and the cystic duct was divided between 2 most distal clips. The cystic artery was ligated with 2 endoclips proximally one distally and the cystic artery was divided between 2 most distal clips. At this point electrocautery was utilized dissect the gallbladder free from the gallbladder fossa. During the dissection a small cholecystotomy was created with a spillage of bile. Once the gallbladder was freed the 10 mm scope is exchanged for a 5 mm scope and the Endo Catch bag was inserted. The gallbladder placed into the Endo Catch bag and placed into the right lower quadrant. At this point the section irrigator was utilized to aspirate the spilled bile and irrigation was continued until the returning aspirate was clear. Inspection the gallbladder fossa indicated good hemostasis. The surface was slightly raw a piece of Surgicel snow was placed into the gallbladder fossa. The endoclips were inspected there is no evidence of any bleeding or bile leak from the clipped structures. At this point the attention was turned to closure.  An Endo Close suture passing device was utilized to pass a 2-0 Vicryl suture through the umbilical trocar site. With this suture and placed the gallbladder was retrieved was removed through the umbilical trocar site and intact Endo Catch bag. It was placed in the back  table and sent as a permanent specimen to pathology. At this point the pneumoperitoneum was evacuated. Trochars were removed. The Vicryl suture was  secured. The local anesthetic was instilled. A 4-0 Monocryl was utilized reapproximate the skin edges at all 4 trocar sites with a running subcuticular suture. The skin was washed dried moist dry towel. Benzoin is applied around incision. Half-inch Steri-Strips are placed. The drapes removed the patient was allowed to mild general anesthetic was transferred to the PACU in stable condition. At the conclusion of the procedure all instrument, sponge, needle counts are correct. Patient tolerated procedure extremely well.  Complications:  None  EBL:  Minimal  Specimen:  Gallbladder

## 2011-09-25 NOTE — H&P (Signed)
  NTS SOAP Note  Vital Signs:  Vitals as of: 09/22/2011: Systolic 135: Diastolic 78: Heart Rate 74: Temp 96.69F: Height 61ft 1in: Weight 257Lbs 0 Ounces: OFC Not Entered: Respiratory Rate Not Entered: O2 Saturation Not Entered: Pain Level 3: BMI 34  BMI : 33.91 kg/m2  Subjective: This 67 Years 36 Months old Male presents for of patient presents with right upper quadrant abdominal tenderness.pain has been intermittent. It is exacerbated with some by mouth intake. No nausea or vomiting. No history of jaundice. No similar severe symptomatology in the past. No significant family history. No change in bowel movements. No melena or hematochezia. He does have occasional bloating.  patient actually presented to [facility] hospital approximately a week ago with symptoms consistent with pancreatitis. She was admitted by the hospitalist service was noted to have pancreatitis. Etiology is affected to be gallstone related.  Review of Symptoms:  tired and sluggish headaches Eyes:unremarkable Nose/Mouth/Throat:unremarkable Cardiovascular:unremarkable Respiratory:unremarkable as per history of present illness Genitourinary:unremarkable back pain Skin:unremarkable Breast:unremarkable Hematolgic/Lymphatic:unremarkable Allergic/Immunologic:unremarkable   Past Medical History:Obtained   Past Medical History  Surgical History: back surgery Medical Problems: none Psychiatric History: none Allergies: no known drug allergies Medications: none   Social History:Obtained  Social History  no alcohol No recreational drug   Smoking Status: Current every day smoker reviewed on 09/23/2011 Started Date: 05/11/1973 Packs per day: 1.00   Family History:Obtained   Family History  diabetes mellitus, coronary artery disease    Objective Information: General:Well appearing, well nourished in no distress.obese Skin:no rash or prominent  lesions Head:Atraumatic; no masses; no abnormalities Eyes:conjunctiva clear, EOM intact, PERRL Mouth:Mucous membranes moist, no mucosal lesions. Neck:Supple without lymphadenopathy.  Heart:RRR, no murmur Lungs:CTA bilaterally, no wheezes, rhonchi, rales.  Breathing unlabored. Abdomen:Soft, ND, no HSM, no masses.mild right upper quadrant abdominal tenderness. No peritoneal signs. No hernias Extremities:No deformities, clubbing, cyanosis, or edema.    right upper quadrant ultrasound/ CT of the abdomen and pelvis: Gallstones  Assessment:  Diagnosis &amp; Procedure: DiagnosisCode: 574.00, ProcedureCode: 16109,    Plan: cholelithiasis.  suspected recent gallstone pancreatitis. Risks benefits alternatives of cholelithiasis were discussed. Possible future pancreatitis was discussed as well. Pathophysiology of pancreatitis was discussed with the patient. questions and concerns are addressed the patient we'll proceed with surgical intervention at his convenience. She is to call with questions or concerns.  Patient Education:Alternative treatments to surgery were discussed with patient (and family).Risks and benefits  of procedure were fully explained to the patient (and family) who gave informed consent. Patient/family questions were addressed.  Follow-up:Pending Surgery                                     Active Diagnosis and Procedures: 574.00 Calculus of gallbladder with acute cholecystitis, without mention of obstruction   99203 - OFFICE OUTPATIENT NEW 30 MINUTES

## 2011-09-25 NOTE — Anesthesia Postprocedure Evaluation (Signed)
Anesthesia Post Note  Patient: Shaun Pugh  Procedure(s) Performed: Procedure(s) (LRB): LAPAROSCOPIC CHOLECYSTECTOMY (N/A)  Anesthesia type: General  Patient location: PACU  Post pain: Pain level controlled  Post assessment: Post-op Vital signs reviewed, Patient's Cardiovascular Status Stable, Respiratory Function Stable, Patent Airway, No signs of Nausea or vomiting and Pain level controlled  Last Vitals:  Filed Vitals:   09/25/11 0941  BP: 200/104  Pulse: 80  Temp: 36.4 C  Resp: 19    Post vital signs: Reviewed and stable  Level of consciousness: awake and alert   Complications: No apparent anesthesia complications

## 2011-09-25 NOTE — Interval H&P Note (Signed)
History and Physical Interval Note:  09/25/2011 8:34 AM  Shaun Pugh  has presented today for surgery, with the diagnosis of cholecystitis  The various methods of treatment have been discussed with the patient and family. After consideration of risks, benefits and other options for treatment, the patient has consented to  Procedure(s) (LRB): LAPAROSCOPIC CHOLECYSTECTOMY (N/A) as a surgical intervention .  The patients' history has been reviewed, patient examined, no change in status, stable for surgery.  I have reviewed the patients' chart and labs.  Questions were answered to the patient's satisfaction.     Delrico Minehart C

## 2011-09-30 ENCOUNTER — Encounter (HOSPITAL_COMMUNITY): Payer: Self-pay | Admitting: General Surgery

## 2011-11-09 DIAGNOSIS — Z8673 Personal history of transient ischemic attack (TIA), and cerebral infarction without residual deficits: Secondary | ICD-10-CM

## 2011-11-09 DIAGNOSIS — H34239 Retinal artery branch occlusion, unspecified eye: Secondary | ICD-10-CM

## 2011-11-09 HISTORY — DX: Personal history of transient ischemic attack (TIA), and cerebral infarction without residual deficits: Z86.73

## 2011-11-09 HISTORY — DX: Retinal artery branch occlusion, unspecified eye: H34.239

## 2012-03-11 DIAGNOSIS — J189 Pneumonia, unspecified organism: Secondary | ICD-10-CM

## 2012-03-11 HISTORY — DX: Pneumonia, unspecified organism: J18.9

## 2012-03-17 ENCOUNTER — Inpatient Hospital Stay (HOSPITAL_COMMUNITY): Payer: Medicare Other

## 2012-03-17 ENCOUNTER — Emergency Department (HOSPITAL_COMMUNITY): Payer: Medicare Other

## 2012-03-17 ENCOUNTER — Inpatient Hospital Stay (HOSPITAL_COMMUNITY)
Admission: EM | Admit: 2012-03-17 | Discharge: 2012-03-23 | DRG: 061 | Disposition: A | Discharge status: Rehabilitation Facility | Payer: Medicare Other | Attending: Emergency Medicine | Admitting: Emergency Medicine

## 2012-03-17 ENCOUNTER — Encounter (HOSPITAL_COMMUNITY): Payer: Self-pay

## 2012-03-17 DIAGNOSIS — J189 Pneumonia, unspecified organism: Secondary | ICD-10-CM | POA: Diagnosis not present

## 2012-03-17 DIAGNOSIS — I6529 Occlusion and stenosis of unspecified carotid artery: Secondary | ICD-10-CM | POA: Diagnosis present

## 2012-03-17 DIAGNOSIS — I6350 Cerebral infarction due to unspecified occlusion or stenosis of unspecified cerebral artery: Secondary | ICD-10-CM

## 2012-03-17 DIAGNOSIS — I639 Cerebral infarction, unspecified: Secondary | ICD-10-CM

## 2012-03-17 DIAGNOSIS — G4733 Obstructive sleep apnea (adult) (pediatric): Secondary | ICD-10-CM | POA: Diagnosis present

## 2012-03-17 DIAGNOSIS — G936 Cerebral edema: Secondary | ICD-10-CM | POA: Diagnosis not present

## 2012-03-17 DIAGNOSIS — F17200 Nicotine dependence, unspecified, uncomplicated: Secondary | ICD-10-CM | POA: Diagnosis present

## 2012-03-17 DIAGNOSIS — I2510 Atherosclerotic heart disease of native coronary artery without angina pectoris: Secondary | ICD-10-CM | POA: Diagnosis present

## 2012-03-17 DIAGNOSIS — I6340 Cerebral infarction due to embolism of unspecified cerebral artery: Principal | ICD-10-CM | POA: Diagnosis present

## 2012-03-17 DIAGNOSIS — R569 Unspecified convulsions: Secondary | ICD-10-CM | POA: Diagnosis not present

## 2012-03-17 DIAGNOSIS — I7410 Embolism and thrombosis of unspecified parts of aorta: Secondary | ICD-10-CM

## 2012-03-17 DIAGNOSIS — Z8673 Personal history of transient ischemic attack (TIA), and cerebral infarction without residual deficits: Secondary | ICD-10-CM

## 2012-03-17 DIAGNOSIS — E785 Hyperlipidemia, unspecified: Secondary | ICD-10-CM | POA: Diagnosis present

## 2012-03-17 DIAGNOSIS — M549 Dorsalgia, unspecified: Secondary | ICD-10-CM | POA: Diagnosis present

## 2012-03-17 DIAGNOSIS — I63432 Cerebral infarction due to embolism of left posterior cerebral artery: Secondary | ICD-10-CM | POA: Diagnosis present

## 2012-03-17 DIAGNOSIS — I7411 Embolism and thrombosis of thoracic aorta: Secondary | ICD-10-CM | POA: Diagnosis present

## 2012-03-17 DIAGNOSIS — Z9861 Coronary angioplasty status: Secondary | ICD-10-CM

## 2012-03-17 DIAGNOSIS — Z79899 Other long term (current) drug therapy: Secondary | ICD-10-CM

## 2012-03-17 DIAGNOSIS — D696 Thrombocytopenia, unspecified: Secondary | ICD-10-CM | POA: Diagnosis not present

## 2012-03-17 DIAGNOSIS — R4789 Other speech disturbances: Secondary | ICD-10-CM | POA: Diagnosis present

## 2012-03-17 DIAGNOSIS — I252 Old myocardial infarction: Secondary | ICD-10-CM

## 2012-03-17 DIAGNOSIS — H53469 Homonymous bilateral field defects, unspecified side: Secondary | ICD-10-CM | POA: Diagnosis present

## 2012-03-17 DIAGNOSIS — G8929 Other chronic pain: Secondary | ICD-10-CM | POA: Diagnosis present

## 2012-03-17 DIAGNOSIS — G8190 Hemiplegia, unspecified affecting unspecified side: Secondary | ICD-10-CM | POA: Diagnosis present

## 2012-03-17 HISTORY — DX: Atherosclerotic heart disease of native coronary artery without angina pectoris: I25.10

## 2012-03-17 LAB — COMPREHENSIVE METABOLIC PANEL
Alkaline Phosphatase: 76 U/L (ref 39–117)
BUN: 18 mg/dL (ref 6–23)
CO2: 22 mEq/L (ref 19–32)
Chloride: 99 mEq/L (ref 96–112)
Creatinine, Ser: 1.16 mg/dL (ref 0.50–1.35)
GFR calc non Af Amer: 66 mL/min — ABNORMAL LOW (ref >90)
Total Bilirubin: 0.4 mg/dL (ref 0.3–1.2)

## 2012-03-17 LAB — PROTIME-INR
INR: 1.01 (ref 0.00–1.49)
Prothrombin Time: 13.2 seconds (ref 11.6–15.2)

## 2012-03-17 LAB — CBC WITH DIFFERENTIAL/PLATELET
HCT: 44 % (ref 39.0–52.0)
Hemoglobin: 15.7 g/dL (ref 13.0–17.0)
Lymphocytes Relative: 35 % (ref 12–46)
Lymphs Abs: 6 10*3/uL — ABNORMAL HIGH (ref 0.7–4.0)
Monocytes Absolute: 1.6 10*3/uL — ABNORMAL HIGH (ref 0.1–1.0)
Monocytes Relative: 9 % (ref 3–12)
Neutro Abs: 9.3 10*3/uL — ABNORMAL HIGH (ref 1.7–7.7)
RBC: 4.99 MIL/uL (ref 4.22–5.81)
WBC: 17.3 10*3/uL — ABNORMAL HIGH (ref 4.0–10.5)

## 2012-03-17 LAB — CK TOTAL AND CKMB (NOT AT ARMC): Relative Index: INVALID (ref 0.0–2.5)

## 2012-03-17 LAB — POCT I-STAT, CHEM 8
Calcium, Ion: 1.15 mmol/L (ref 1.13–1.30)
Creatinine, Ser: 1.2 mg/dL (ref 0.50–1.35)
Hemoglobin: 15.6 g/dL (ref 13.0–17.0)
Sodium: 140 mEq/L (ref 135–145)
TCO2: 22 mmol/L (ref 0–100)

## 2012-03-17 LAB — MRSA PCR SCREENING: MRSA by PCR: NEGATIVE

## 2012-03-17 MED ORDER — ONDANSETRON HCL 4 MG/2ML IJ SOLN
4.0000 mg | Freq: Four times a day (QID) | INTRAMUSCULAR | Status: DC | PRN
  Administered 2012-03-17 – 2012-03-19 (×2): 4 mg via INTRAVENOUS
  Filled 2012-03-17 (×2): qty 2

## 2012-03-17 MED ORDER — SODIUM CHLORIDE 0.9 % IV SOLN
250.0000 mL | Freq: Once | INTRAVENOUS | Status: AC
  Administered 2012-03-17: 75 mL via INTRAVENOUS

## 2012-03-17 MED ORDER — ALTEPLASE (STROKE) FULL DOSE INFUSION: 90.0000 mg | Freq: Once | INTRAVENOUS | Status: DC

## 2012-03-17 MED ORDER — SENNOSIDES-DOCUSATE SODIUM 8.6-50 MG PO TABS
1.0000 | ORAL_TABLET | Freq: Every evening | ORAL | Status: DC | PRN
  Administered 2012-03-23: 1 via ORAL
  Filled 2012-03-17 (×2): qty 1

## 2012-03-17 MED ORDER — IOHEXOL 350 MG/ML SOLN
50.0000 mL | Freq: Once | INTRAVENOUS | Status: AC | PRN
  Administered 2012-03-17: 50 mL via INTRAVENOUS

## 2012-03-17 MED ORDER — ACETAMINOPHEN 650 MG RE SUPP
650.0000 mg | RECTAL | Status: DC | PRN
  Administered 2012-03-17 – 2012-03-20 (×9): 650 mg via RECTAL
  Filled 2012-03-17 (×9): qty 1

## 2012-03-17 MED ORDER — HYDROMORPHONE HCL PF 1 MG/ML IJ SOLN
INTRAMUSCULAR | Status: AC
  Filled 2012-03-17: qty 2

## 2012-03-17 MED ORDER — PANTOPRAZOLE SODIUM 40 MG IV SOLR
40.0000 mg | Freq: Every day | INTRAVENOUS | Status: DC
  Administered 2012-03-17 – 2012-03-21 (×5): 40 mg via INTRAVENOUS
  Filled 2012-03-17 (×6): qty 40

## 2012-03-17 MED ORDER — STROKE: EARLY STAGES OF RECOVERY BOOK
Freq: Once | Status: AC
  Administered 2012-03-17: 1
  Filled 2012-03-17: qty 1

## 2012-03-17 MED ORDER — SODIUM CHLORIDE 0.9 % IV SOLN
INTRAVENOUS | Status: DC
Start: 2012-03-17 — End: 2012-03-23
  Administered 2012-03-17 – 2012-03-21 (×5): via INTRAVENOUS
  Administered 2012-03-21: 1000 mL via INTRAVENOUS
  Administered 2012-03-23: 13:00:00 via INTRAVENOUS

## 2012-03-17 MED ORDER — LABETALOL HCL 5 MG/ML IV SOLN
10.0000 mg | INTRAVENOUS | Status: DC | PRN
  Administered 2012-03-17: 10 mg via INTRAVENOUS
  Filled 2012-03-17 (×2): qty 4

## 2012-03-17 MED ORDER — ALTEPLASE (STROKE) FULL DOSE INFUSION
90.0000 mg | Freq: Once | INTRAVENOUS | Status: AC
  Administered 2012-03-17: 90 mg via INTRAVENOUS
  Filled 2012-03-17: qty 90

## 2012-03-17 MED ORDER — ACETAMINOPHEN 325 MG PO TABS
650.0000 mg | ORAL_TABLET | ORAL | Status: DC | PRN
  Administered 2012-03-20: 650 mg via ORAL
  Filled 2012-03-17: qty 2

## 2012-03-17 MED ORDER — HYDROMORPHONE HCL PF 1 MG/ML IJ SOLN
2.0000 mg | INTRAMUSCULAR | Status: DC | PRN
  Administered 2012-03-17: 2 mg via INTRAVENOUS

## 2012-03-17 MED ORDER — ONDANSETRON 8 MG/NS 50 ML IVPB
8.0000 mg | Freq: Once | INTRAVENOUS | Status: AC
  Administered 2012-03-17: 8 mg via INTRAVENOUS
  Filled 2012-03-17: qty 8

## 2012-03-17 MED ORDER — TRAMADOL HCL 50 MG PO TABS
50.0000 mg | ORAL_TABLET | Freq: Four times a day (QID) | ORAL | Status: DC | PRN
  Administered 2012-03-17: 50 mg via ORAL
  Filled 2012-03-17 (×2): qty 1

## 2012-03-17 NOTE — ED Notes (Signed)
Unable to accomplish neuro assessment due to CTA in progress.

## 2012-03-17 NOTE — Progress Notes (Signed)
Pt's wife was bedside w/pt and staff when I arrived. I stayed in Trauma C to support wife. I also escorted other family members to Consult rm C and stayed w/family until pt was stabilized. Family's pastors were also present and had prayer w/family. Provided other Chaplain emotional support as needed.  Marjory Lies  Chaplain

## 2012-03-17 NOTE — ED Notes (Signed)
Pt taken to scanner, remains monitored.  CTA in progress.  Pt tolerating well so far.

## 2012-03-17 NOTE — Code Documentation (Signed)
61 yo male who was LKW at 1100 this morning, though he did complain of a headache.  He was working in the yard, and around 1215  he came inside and wife noticed he was having difficulty moving his LUE, had numbness of same, and slurred speech.[facility] was called, and they activated code stroke at 1302. The stroke team arrived at 59.  Pt arrived in the ED at 1309, the EDP exam was at 1310.  He immediately vomited on arrival, and zofran was ordered as we were enroute to CT.   The CT was unremarkable, but there were significant neurological deficits with NIHSS 17 (see doc flowsheet).  The pt has a L hom.hemianopia, but he reports having recently had a retinal artery problem and that vision was cloudy in the L eye PTA.  He has a R gaze preference (? L neglect), though he can clearly move his eyes completely to the L.  His LUE is flaccid and he uses his R arm to move/locate the LUE.  There is sensory loss in the LUE only, and he clearly does not acknowledgel the L side with double simultaneous stimulation.  Inc/excl criteria for tPA were reviewed, and LKW time confirmed with wife.  Decision made to give tPA which was begun at 1408.  VS/neuro cks stable and w/in tPA parameters.   Pt communicates well, answers questions.  He is somewhat restless and clearly bothered by the L neglect and inability to "find" his LUE.  His speech is clear, and he can name objects though he has some difficulty reading due to visual deficits.  Wife, children, pt all updated about treatment plan, risk/benefits and are agreeable to the plan.

## 2012-03-17 NOTE — ED Notes (Signed)
Pt was working out in yard at approximately 1215 today and noted pt to be weak and dizzy.  Per EMS, pt has decreased sensation to L side that has worsened in route.

## 2012-03-17 NOTE — H&P (Signed)
Admission H&P    Chief Complaint: stroke HPI: Shaun Pugh is an 61 y.o. male who has a complicated history over the last 6 months.  Back in July patient was having visual issues with his left visual field.  He had seen a opthalmologist who diagnosed him with a retinal artery occlusion. He was placed on ASA at that time. Since that date he has been having issues with dizziness and headaches off and on.  He recently was seen by opthalmologist who stated he visualized multiple small embolisms in right retina and then 2 weeks ago was seen by neurologist at [facility] who was concerned for possible strokes/TIA's due to his symptoms of dizziness and HA.  These symptoms were not persistent.  No specific diagnosis of CVA was made at that time. He was scheduled to have a MRI of brain (he is claustrophobia) as out patient next Monday.  Today he has complained of a HA at 9 AM but this cleared over time.  At 11AM He again complained of HA, left sided weakness and numbness along with slurred speech. EMS was called and patient was brought to ED as Code stroke. In depth discussion was made with wife about all the risks and benefits.  Decision was made to administer tPA.   LSN: 11 AM tPA Given: Yes   Past Medical History  Diagnosis Date  . High cholesterol   . Atrophy of right kidney 09/15/2011  . Kidney calculi 09/15/2011  . Acute pancreatitis 09/14/2011  . Cholelithiasis 09/14/2011  . Myocardial infarction   . Chronic back pain   . Sleep apnea     STOP BANG 5    Past Surgical History  Procedure Date  . Back surgery     x 4   . Cholecystectomy 09/25/2011    Procedure: LAPAROSCOPIC CHOLECYSTECTOMY;  Surgeon: Fabio Bering, MD;  Location: [facility];  Service: General;  Laterality: N/A;    Family History  Problem Relation Age of Onset  . Anesthesia problems Neg Hx   . Hypotension Neg Hx   . Malignant hyperthermia Neg Hx   . Pseudochol deficiency Neg Hx    Social History:  reports that he has  been smoking.  He does not have any smokeless tobacco history on file. He reports that he does not drink alcohol or use illicit drugs.  Allergies: No Known Allergies   ROS: History obtained from wife  General ROS: negative for - chills, fatigue, fever, night sweats, weight gain or weight loss Psychological ROS: negative for - behavioral disorder, hallucinations, memory difficulties, mood swings or suicidal ideation Ophthalmic ROS: Positve for - blurry vision, double vision, eye pain or loss of vision ENT ROS: negative for - epistaxis, nasal discharge, oral lesions, sore throat, tinnitus or vertigo Allergy and Immunology ROS: negative for - hives or itchy/watery eyes Hematological and Lymphatic ROS: negative for - bleeding problems, bruising or swollen lymph nodes Endocrine ROS: negative for - galactorrhea, hair pattern changes, polydipsia/polyuria or temperature intolerance Respiratory ROS: negative for - cough, hemoptysis, shortness of breath or wheezing Cardiovascular ROS: negative for - chest pain, dyspnea on exertion, edema or irregular heartbeat Gastrointestinal ROS: negative for - abdominal pain, diarrhea, hematemesis, nausea/vomiting or stool incontinence Genito-Urinary ROS: negative for - dysuria, hematuria, incontinence or urinary frequency/urgency Musculoskeletal ROS: Positive for - joint swelling, foot pain or muscular weakness Neurological ROS: as noted in HPI Dermatological ROS: negative for rash and skin lesion changes   Physical Examination: Blood pressure 171/82, pulse  75, resp. rate 18, SpO2 99.00%.  HEENT-  Normocephalic, no lesions, without obvious abnormality.  Normal external eye and conjunctiva.  Normal TM's bilaterally.  Normal auditory canals and external ears. Normal external nose, mucus membranes and septum.  Normal pharynx. Neck supple with no masses, nodes, nodules or enlargement. Cardiovascular - regular rate and rhythm Lungs - chest clear, no wheezing,  rales, normal symmetric air entry Abdomen - normal findings: bowel sounds normal Extremities - less then 2 second capillary refill and no joint deformities, effusion, or inflammation  Neurologic Examination: Mental Status: Alert, oriented, thought content appropriate.  Speech fluent without evidence of aphasia.  Able to follow simple commands without difficulty. Cranial Nerves: II: Discs flat bilaterally; Left himianopsia, pupils equal, round, reactive to light and accommodation III,IV, VI: ptosis not present, extra-ocular motions intact bilaterally V,VII: smile asymmetric with left decrease NL fold, facial light touch intact bilaterally VIII: hearing normal bilaterally IX,X: gag reflex present XI: bilateral shoulder shrug XII: midline tongue extension Motor: Right : Upper extremity   5/5    Left:     Upper extremity   0/5--see note  Lower extremity   5/5     Lower extremity   4/5 --Able to bend both legs up antigravity spontaneously with good strength.  Unable to hold left arm antigravity or squeeze left hand. No movement noted with left arm when asked to move and to painful stimuli.  Tone and bulk:normal tone throughout; no atrophy noted Sensory: Pinprick and light touch intact with exception of left arm with patient states he has no sensation.  Deep Tendon Reflexes: 2+ and symmetric throughout Plantars: Right: downgoing   Left: downgoing Cerebellar: normal finger-to-nose on the right, unable to attain on the left CV: pulses palpable throughout     Results for orders placed during the hospital encounter of 03/17/12 (from the past 48 hour(s))  CBC WITH DIFFERENTIAL     Status: Abnormal   Collection Time   03/17/12  1:15 PM      Component Value Range Comment   WBC 17.3 (*) 4.0 - 10.5 K/uL    RBC 4.99  4.22 - 5.81 MIL/uL    Hemoglobin 15.7  13.0 - 17.0 g/dL    HCT 40.9  81.1 - 91.4 %    MCV 88.2  78.0 - 100.0 fL    MCH 31.5  26.0 - 34.0 pg    MCHC 35.7  30.0 - 36.0 g/dL    RDW  78.2  95.6 - 21.3 %    Platelets 207  150 - 400 K/uL    Neutrophils Relative 54  43 - 77 %    Neutro Abs 9.3 (*) 1.7 - 7.7 K/uL    Lymphocytes Relative 35  12 - 46 %    Lymphs Abs 6.0 (*) 0.7 - 4.0 K/uL    Monocytes Relative 9  3 - 12 %    Monocytes Absolute 1.6 (*) 0.1 - 1.0 K/uL    Eosinophils Relative 2  0 - 5 %    Eosinophils Absolute 0.4  0.0 - 0.7 K/uL    Basophils Relative 1  0 - 1 %    Basophils Absolute 0.1  0.0 - 0.1 K/uL    Ct Head Wo Contrast  03/17/2012  *RADIOLOGY REPORT*  Clinical Data: Headache.  Code stroke.  CT HEAD WITHOUT CONTRAST  Technique:  Contiguous axial images were obtained from the base of the skull through the vertex without contrast.  Comparison: None.  Findings: Mild global atrophy.  Minimal chronic ischemic changes in the periventricular white matter.  No mass effect, midline shift, or acute intracranial hemorrhage.  Chronic ischemic changes in the thalamus bilaterally. Low density in the right frontal periventricular white matter is present on image 21 without involvement of the overlying great cortex. This likely represents chronic ischemic changes.  Minimal fluid in the left mastoid air cells.  Minimal mucosal thickening in the sphenoid sinus.  Cranium intact.  IMPRESSION: Paranasal sinus mucosal thickening and fluid in the left mastoid air cells.  Otherwise no acute intracranial pathology.   Original Report Authenticated By: Jolaine Click, M.D.    Felicie Morn, PA-C [facility]  Patient seen and examined.  Clinical course and management discussed.  Necessary edits performed.  I agree with the above.  Assessment and plan of care developed and discussed below.    Assessment: 61 y.o. male presenting with [facility], left sided weakness and numbness.  Head CT was unremarkable but symptoms consistent with an acute right brain stroke.  Exclusion criteria discussed with wife.  Risks and benefits of potential interventions discussed.  Verbal consent obtained for  tPA.  tPA administered.    Stroke Risk Factors - hyperlipidemia  Plan: 1. HgbA1c, fasting lipid panel 2. MRI, MRA  of the brain without contrast will need to be performed under sedation 3. PT consult, OT consult, Speech consult 4. Echocardiogram 5. Carotid dopplers 6. Prophylactic therapy-None 7. Risk factor modification 8. Telemetry monitoring 9.  Repeat head CT in 24 hours 10. CTA of the head and neck.  Will investigate for the possible need for thrombectomy if no improvement in symptoms seen after tPA administration.    This patient is critically ill and at significant risk of neurological worsening, death and care requires constant monitoring of vital signs, hemodynamics,respiratory and cardiac monitoring, neurological assessment, discussion with family, other specialists and medical decision making of high complexity. I spent 95 minutes of neurocritical care time  in the care of  this patient.   Thana Farr, MD [facility] [phone]  03/17/2012  3:14 PM

## 2012-03-17 NOTE — Progress Notes (Signed)
Patient arrived on unit at 1600. Patient is restless, agitated and will not comply with obtaining a blood pressure. Neurologist notified of patient condition and not being able to obtain blood pressure. Stat CT ordered. Will continue to assess.

## 2012-03-17 NOTE — ED Provider Notes (Signed)
History     CSN: [id number]  Arrival date & time 03/17/12  1309   First MD Initiated Contact with Patient 03/17/12 1312      No chief complaint on file.   (Consider location/radiation/quality/duration/timing/severity/associated sxs/prior Treatment)  The history is provided by the patient.  Shaun Pugh is a 61 y.o. male history of MI, TIA here presenting with left-sided weakness. Acute onset of left-sided weakness 45 minutes prior to arrival, some aphasia as well. The ambulance he started vomiting but is protecting his airway. Hx of TIA previously. Denies chest pain or SOB. Not on coumadin.    Level V caveat- aphasia, condition of the patient.    Past Medical History  Diagnosis Date  . High cholesterol   . Atrophy of right kidney 09/15/2011  . Kidney calculi 09/15/2011  . Acute pancreatitis 09/14/2011  . Cholelithiasis 09/14/2011  . Myocardial infarction   . Chronic back pain   . Sleep apnea     STOP BANG 5    Past Surgical History  Procedure Date  . Back surgery     x 4   . Cholecystectomy 09/25/2011    Procedure: LAPAROSCOPIC CHOLECYSTECTOMY;  Surgeon: Fabio Bering, MD;  Location: [facility];  Service: General;  Laterality: N/A;    Family History  Problem Relation Age of Onset  . Anesthesia problems Neg Hx   . Hypotension Neg Hx   . Malignant hyperthermia Neg Hx   . Pseudochol deficiency Neg Hx     History  Substance Use Topics  . Smoking status: Current Every Day Smoker -- 1.0 packs/day for 45 years  . Smokeless tobacco: Not on file  . Alcohol Use: No      Review of Systems  Neurological: Positive for speech difficulty and weakness.  All other systems reviewed and are negative.    Allergies  Review of patient's allergies indicates no known allergies.  Home Medications  No current outpatient prescriptions on file.  BP 156/90  Pulse 76  Resp 18  SpO2 99%  Physical Exam  Nursing note and vitals reviewed. Constitutional:       Uncomfortable, vomiting    HENT:  Head: Normocephalic.  Mouth/Throat: Oropharynx is clear and moist.  Eyes: Conjunctivae normal are normal. Pupils are equal, round, and reactive to light.  Neck: Normal range of motion. Neck supple.  Cardiovascular: Normal rate, regular rhythm and normal heart sounds.   Pulmonary/Chest: Effort normal and breath sounds normal. No respiratory distress. He has no wheezes. He has no rales.  Abdominal: Soft. Bowel sounds are normal. He exhibits no distension. There is no tenderness.  Musculoskeletal: He exhibits no edema.  Neurological: He is alert.       Mild L facial droop. Unable to move L side. Strength 5/5 on R side.   Skin: Skin is warm and dry.  Psychiatric: He has a normal mood and affect. His behavior is normal. Judgment and thought content normal.    ED Course  Procedures (including critical care time)  CRITICAL CARE Performed by: Silverio Lay, Quint Chestnut   Total critical care time: 30 minutes   Critical care time was exclusive of separately billable procedures and treating other patients.  Critical care was necessary to treat or prevent imminent or life-threatening deterioration.  Critical care was time spent personally by me on the following activities: development of treatment plan with patient and/or surrogate as well as nursing, discussions with consultants, evaluation of patient's response to treatment, examination of patient, obtaining history from patient or  surrogate, ordering and performing treatments and interventions, ordering and review of laboratory studies, ordering and review of radiographic studies, pulse oximetry and re-evaluation of patient's condition.   Labs Reviewed  CBC WITH DIFFERENTIAL - Abnormal; Notable for the following:    WBC 17.3 (*)     Neutro Abs 9.3 (*)     Lymphs Abs 6.0 (*)     Monocytes Absolute 1.6 (*)     All other components within normal limits  POCT I-STAT, CHEM 8 - Abnormal; Notable for the following:    Glucose, Bld 106 (*)     All  other components within normal limits  PROTIME-INR  COMPREHENSIVE METABOLIC PANEL  APTT  CK TOTAL AND CKMB  TROPONIN I   Ct Head Wo Contrast  03/17/2012  *RADIOLOGY REPORT*  Clinical Data: Headache.  Code stroke.  CT HEAD WITHOUT CONTRAST  Technique:  Contiguous axial images were obtained from the base of the skull through the vertex without contrast.  Comparison: None.  Findings: Mild global atrophy.  Minimal chronic ischemic changes in the periventricular white matter.  No mass effect, midline shift, or acute intracranial hemorrhage.  Chronic ischemic changes in the thalamus bilaterally. Low density in the right frontal periventricular white matter is present on image 21 without involvement of the overlying great cortex. This likely represents chronic ischemic changes.  Minimal fluid in the left mastoid air cells.  Minimal mucosal thickening in the sphenoid sinus.  Cranium intact.  IMPRESSION: Paranasal sinus mucosal thickening and fluid in the left mastoid air cells.  Otherwise no acute intracranial pathology.   Original Report Authenticated By: Jolaine Click, M.D.      No diagnosis found.   Date: 03/17/2012  Rate: 75  Rhythm: normal sinus rhythm  QRS Axis: normal  Intervals: normal  ST/T Wave abnormalities: nonspecific ST changes  Conduction Disutrbances:none  Narrative Interpretation:   Old EKG Reviewed: none available    MDM  Shaun Pugh is a 61 y.o. male here with L sided weakness, likely stroke. Code stroke activated. Neuro at bedside.   2:21 PM CT showed no bleed. Neuro at bedside. TPA is prepared and administered as per neuro. Will admit to neuro ICU for acute stroke.        Richardean Canal, MD 03/17/12 [phone]

## 2012-03-17 NOTE — ED Notes (Signed)
Wife at pt's side in CT scanner.  She reports pt c/o headache at approximately 0900 today, had taken his medication and that it was nearly resolved, but pt c/o same at present.

## 2012-03-17 NOTE — ED Notes (Signed)
Pt transported back to C from CT scanner.

## 2012-03-18 ENCOUNTER — Inpatient Hospital Stay (HOSPITAL_COMMUNITY): Payer: Medicare Other

## 2012-03-18 ENCOUNTER — Encounter (HOSPITAL_COMMUNITY): Payer: Self-pay | Admitting: Nurse Practitioner

## 2012-03-18 DIAGNOSIS — I63432 Cerebral infarction due to embolism of left posterior cerebral artery: Secondary | ICD-10-CM | POA: Diagnosis present

## 2012-03-18 DIAGNOSIS — I6789 Other cerebrovascular disease: Secondary | ICD-10-CM

## 2012-03-18 DIAGNOSIS — R569 Unspecified convulsions: Secondary | ICD-10-CM | POA: Diagnosis not present

## 2012-03-18 DIAGNOSIS — Z955 Presence of coronary angioplasty implant and graft: Secondary | ICD-10-CM | POA: Insufficient documentation

## 2012-03-18 DIAGNOSIS — I6340 Cerebral infarction due to embolism of unspecified cerebral artery: Principal | ICD-10-CM

## 2012-03-18 LAB — LIPID PANEL
Cholesterol: 179 mg/dL (ref 0–200)
LDL Cholesterol: 114 mg/dL — ABNORMAL HIGH (ref 0–99)
Total CHOL/HDL Ratio: 5.6 RATIO
Triglycerides: 163 mg/dL — ABNORMAL HIGH (ref <150)
VLDL: 33 mg/dL (ref 0–40)

## 2012-03-18 LAB — URINALYSIS, ROUTINE W REFLEX MICROSCOPIC
Bilirubin Urine: NEGATIVE
Hgb urine dipstick: NEGATIVE
Nitrite: NEGATIVE
Protein, ur: NEGATIVE mg/dL
Specific Gravity, Urine: 1.024 (ref 1.005–1.030)
Urobilinogen, UA: 1 mg/dL (ref 0.0–1.0)

## 2012-03-18 MED ORDER — SODIUM CHLORIDE 0.9 % IV SOLN
1000.0000 mg | Freq: Once | INTRAVENOUS | Status: DC
  Administered 2012-03-18: 1000 mg via INTRAVENOUS
  Filled 2012-03-18: qty 10

## 2012-03-18 MED ORDER — FUROSEMIDE 10 MG/ML IJ SOLN
INTRAMUSCULAR | Status: AC
  Filled 2012-03-18: qty 4

## 2012-03-18 MED ORDER — PIPERACILLIN-TAZOBACTAM 3.375 G IVPB
3.3750 g | Freq: Three times a day (TID) | INTRAVENOUS | Status: DC
  Administered 2012-03-18 – 2012-03-21 (×9): 3.375 g via INTRAVENOUS
  Filled 2012-03-18 (×11): qty 50

## 2012-03-18 MED ORDER — ASPIRIN 300 MG RE SUPP
300.0000 mg | Freq: Every day | RECTAL | Status: DC
  Administered 2012-03-18 – 2012-03-20 (×3): 300 mg via RECTAL
  Filled 2012-03-18 (×5): qty 1

## 2012-03-18 MED ORDER — FUROSEMIDE 10 MG/ML IJ SOLN
10.0000 mg | Freq: Once | INTRAMUSCULAR | Status: AC
  Administered 2012-03-18: 10 mg via INTRAVENOUS

## 2012-03-18 MED ORDER — SODIUM CHLORIDE 0.9 % IV SOLN
1000.0000 mg | INTRAVENOUS | Status: AC
  Administered 2012-03-18: 1000 mg via INTRAVENOUS
  Filled 2012-03-18: qty 10

## 2012-03-18 MED ORDER — PROMETHAZINE HCL 25 MG/ML IJ SOLN
12.5000 mg | Freq: Once | INTRAMUSCULAR | Status: AC
  Administered 2012-03-18: 12.5 mg via INTRAVENOUS
  Filled 2012-03-18: qty 1

## 2012-03-18 MED ORDER — SODIUM CHLORIDE 0.9 % IV SOLN
1000.0000 mg | Freq: Two times a day (BID) | INTRAVENOUS | Status: DC
  Administered 2012-03-18 – 2012-03-21 (×6): 1000 mg via INTRAVENOUS
  Filled 2012-03-18 (×9): qty 10

## 2012-03-18 MED ORDER — LORAZEPAM 2 MG/ML IJ SOLN
INTRAMUSCULAR | Status: AC
  Filled 2012-03-18: qty 1

## 2012-03-18 MED ORDER — SODIUM CHLORIDE 0.9 % IV SOLN
500.0000 mg | Freq: Two times a day (BID) | INTRAVENOUS | Status: DC
  Filled 2012-03-18: qty 5

## 2012-03-18 MED ORDER — LORAZEPAM 2 MG/ML IJ SOLN
1.0000 mg | Freq: Once | INTRAMUSCULAR | Status: AC
  Administered 2012-03-18: 1 mg via INTRAVENOUS

## 2012-03-18 NOTE — Progress Notes (Addendum)
PT/OT Cancellation Note  Patient Details Name: Shaun Pugh MRN: [id number] DOB: [birth year]   Cancelled Treatment:    Reason Eval/Treat Not Completed: Patient not medically ready.  Pt currently on strict bedrest and just went off floor for stat CT.  PT/OT will hold evaluation.   [place] Fant 03/18/2012, 1:20 PM Jake Shark, PT DPT [phone]

## 2012-03-18 NOTE — Progress Notes (Signed)
Portable EEG completed

## 2012-03-18 NOTE — Progress Notes (Signed)
UR COMPLETED  

## 2012-03-18 NOTE — Progress Notes (Signed)
MD notified of patient being lethargic, not answering orientation questions and respiratory status. 1mg  Ativan given at 0930 for seizure activity. CCM on unit and asked to assess respiratory status. CCM consulted with Stoke MD. No new orders at this time.

## 2012-03-18 NOTE — Progress Notes (Signed)
Dr. Pearlean Brownie reviewed CT. Large cortical/subcortical R MCA infarct. Suspect reocclusion as CTA negative yesterday. Cortical stroke leading to seizures. Temp > 102. ? Etiology, likely aspiration. Will add zosyn.  Rounding team just increased keppra to 1000 bid for ongoing seizures and aspirin for secondary stroke prevention.  Will also add CT head for am  Annie Main, MSN, RN, ANVP-BC, ANP-BC, GNP-BC [facility] Pager: [phone] 03/18/2012 3:31 PM  Scribe for Dr. Delia Heady, Stroke Center Medical Director, who has personally reviewed chart, pertinent data, examined the patient and developed the plan of care. Pager:  [phone]

## 2012-03-18 NOTE — Progress Notes (Signed)
Shaun Pugh, OTR/L Pager: [phone] 03/18/2012

## 2012-03-18 NOTE — Progress Notes (Signed)
Stroke Team Progress Note  HISTORY Shaun Pugh is an 61 y.o. male who has a complicated history over the last 6 months. Back in July patient was having visual issues with his left visual field. He had seen a opthalmologist who diagnosed him with a retinal artery occlusion. He was placed on ASA at that time. Since that date he has been having issues with dizziness and headaches off and on. He recently was seen by opthalmologist who stated he visualized multiple small embolisms in right retina and then 2 weeks ago was seen by neurologist at [facility] who was concerned for possible strokes/TIA's due to his symptoms of dizziness and HA. These symptoms were not persistent. No specific diagnosis of CVA was made at that time. He was scheduled to have a MRI of brain (he is claustrophobia) as out patient next Monday. Today 03/17/2012 he has complained of a HA at 9 AM but this cleared over time. At 11AM He again complained of HA, left sided weakness and numbness along with slurred speech. EMS was called and patient was brought to ED as Code stroke. In depth discussion was made with wife about all the risks and benefits. Decision was made to administer tPA. He was admitted to the neuro ICU for further evaluation and treatment.patient saw opthalmologist this week who noticed new retinal emboli on eye exam as per his wife.  SUBJECTIVE His wife and multiple family members are at the bedside.  Overall he feels his condition is gradually worsening. Combatitive and agitated over night. Also with nausea. Treated with medications that have now led to lethargy. New onset  Focal seizures involving left upper extremity noted  this am during my exam..  OBJECTIVE Most recent Vital Signs: Filed Vitals:   03/18/12 0400 03/18/12 0500 03/18/12 0600 03/18/12 0700  BP: 153/98 146/93 158/68 170/75  Pulse: 89 89 86 131  Temp: 100.4 F (38 C)     TempSrc: Oral     Resp: 18 19 18 22   Height:      Weight:      SpO2: 96%  96% 97% 93%   IV Fluid Intake:     . sodium chloride 75 mL/hr at 03/17/12 2000    MEDICATIONS    . [COMPLETED]  stroke: mapping our early stages of recovery book   Does not apply Once  . [COMPLETED] sodium chloride  250 mL Intravenous Once  . [COMPLETED] alteplase  90 mg Intravenous Once  . [COMPLETED] furosemide      . [COMPLETED] furosemide  10 mg Intravenous Once  . [COMPLETED] HYDROmorphone      . [COMPLETED] ondansetron (ZOFRAN) IV  8 mg Intravenous Once  . pantoprazole (PROTONIX) IV  40 mg Intravenous QHS  . [COMPLETED] promethazine  12.5 mg Intravenous Once  . [DISCONTINUED] alteplase  90 mg Intravenous Once   PRN:  acetaminophen, acetaminophen, HYDROmorphone (DILAUDID) injection, [COMPLETED] iohexol, labetalol, ondansetron (ZOFRAN) IV, senna-docusate, traMADol  Diet:  NPO  Activity:  Bedrest DVT Prophylaxis:  SCDs   CLINICALLY SIGNIFICANT STUDIES Basic Metabolic Panel:  Lab 03/17/12 9604 03/17/12 1315  NA 140 135  K 3.9 3.9  CL 106 99  CO2 -- 22  GLUCOSE 106* 107*  BUN 19 18  CREATININE 1.20 1.16  CALCIUM -- 9.7  MG -- --  PHOS -- --   Liver Function Tests:  Lab 03/17/12 1315  AST 15  ALT 19  ALKPHOS 76  BILITOT 0.4  PROT 8.0  ALBUMIN 3.9   CBC:  Lab 03/17/12 1353 03/17/12 1315  WBC -- 17.3*  NEUTROABS -- 9.3*  HGB 15.6 15.7  HCT 46.0 44.0  MCV -- 88.2  PLT -- 207   Coagulation:  Lab 03/17/12 1315  LABPROT 13.2  INR 1.01   Cardiac Enzymes:  Lab 03/17/12 1315  CKTOTAL 34  CKMB 1.6  CKMBINDEX --  TROPONINI <0.30   Urinalysis: No results found for this basename: COLORURINE:2,APPERANCEUR:2,LABSPEC:2,PHURINE:2,GLUCOSEU:2,HGBUR:2,BILIRUBINUR:2,KETONESUR:2,PROTEINUR:2,UROBILINOGEN:2,NITRITE:2,LEUKOCYTESUR:2 in the last 168 hours Lipid Panel    Component Value Date/Time   CHOL 179 03/18/2012 0420   TRIG 163* 03/18/2012 0420   HDL 32* 03/18/2012 0420   CHOLHDL 5.6 03/18/2012 0420   VLDL 33 03/18/2012 0420   LDLCALC 114* 03/18/2012 0420   HgbA1C   Lab Results  Component Value Date   HGBA1C 5.8* 09/15/2011    Urine Drug Screen:   No results found for this basename: labopia, cocainscrnur, labbenz, amphetmu, thcu, labbarb    Alcohol Level: No results found for this basename: ETH:2 in the last 168 hours  CT of the brain   03/17/2012 No acute intracranial abnormality seen. 03/17/2012  Paranasal sinus mucosal thickening and fluid in the left mastoid air cells.  Otherwise no acute intracranial pathology.     Ct Angio Head 03/17/2012   1.  Decreased enhancement of the right MCA M2 branches of the anterior sylvian division.  No major branch occlusion, no intracranial hemodynamically significant stenosis identified. Otherwise minimal to mild intracranial atherosclerosis. 2.  Subtle cortical hypodensity in the anterior right superior frontal gyrus suspicious for ischemia in this setting.  No mass effect or hemorrhage. 3.  Age indeterminate but favor chronic lacunar infarct in the right thalamus.      Ct Angio Neck 03/17/2012   1.  Ulcerated plaque at the right ICA origin resulting in 60% right ICA stenosis. 2.  Findings suspicious for 10 mm of focal thrombus in the proximal aortic arch (see axial image 6). This was discussed with Dr. Thana Farr at 1609 hours on 03/17/2012. 3.  Mild left carotid atherosclerosis.  Dominant right vertebral artery. 4.  See intracranial findings below. 5.  Small 9 mm nodule in the right parotid gland is indeterminate.    MRI of the brain  claustophobic  MRA of the brain  See CTA brain  2D Echocardiogram    Carotid Doppler  See CTA neck  CXR   03/17/2012  Slight atelectasis at the left lung base.  EKG  normal sinus rhythm, RBBB.   EEG      Therapy Recommendations PT - ; OT - ; ST -   Physical Exam   Sedated.Awake alert. Afebrile. Head is nontraumatic. Neck is supple without bruit. Hearing is normal. Cardiac exam no murmur or gallop. Lungs are clear to auscultation. Distal pulses are well felt.  Neurological  Exam : lethargic barely opens eyes and follows few simple commands. Mild right gaze preference but can look to left past midline. Decrease blink to threat bilaterally. Pupils 4 mm equal and reactive. Dysarthric speech. Tongue midline. Dense left hemiplegia with flaccidity. Minimal withdrawal to pain on left side.purposeful antigravity right sided movements present. Suspect diminished sensation to touch and pinprick on left side. Plantars left upgoing and right is downgoing.  ASSESSMENT [name] is a 61 y.o. male presenting with HA, left sided weakness and numbness along with slurred speech. Status post IV t-PA 03/17/2012 at 1408. Initial imaging confirms no acute infarct. Suspect right MCA infarct. Infarct felt to be embolic secondary to unknown etiology.  Work up underway. On aspirin 325 mg orally every day prior to admission. Now on antiplatelets as within 24h of tPA for secondary stroke prevention. Patient with resultant agitation, lethargy, left hemiparesis, seizure, dysarthria, visual defect.  retinal artery occlusion July 2013 Concern with right retina embolisms last week Hyperlipidemia, LDL 114, on statin PTAnow, goal LDL < 100 CAD, ?MI, definite stent placement approx 2004, on plavix following OSA New onset seizure 03/18/2012, partial, involving the arms and face No known history of atrial fibrillation  Hospital day # 1  TREATMENT/PLAN  Add aspirin 300 mg suppository every day for secondary stroke prevention if imaging 24h after tPA is negative for hemorrhage.  CT at 1400 this afternoon  F/u 2D  Stat ativan, start keppra for seizures  F/u EEG (currently underway)  NPO, ST reassess swallow when more awake  Keep in ICU for now  D/w patient and wife and answered questions. This patient is critically ill and at significant risk of neurological worsening, death and care requires constant monitoring of vital signs, hemodynamics,respiratory and cardiac monitoring,review of  multiple databases, neurological assessment, discussion with family, other specialists and medical decision making of high complexity. I spent 30 minutes of neurocritical care time  in the care of  this patient.   Annie Main, MSN, RN, ANVP-BC, ANP-BC, [facility] Pager: [phone] 03/18/2012 8:26 AM  Scribe for Dr. Delia Heady, Stroke Center Medical Director, who has personally reviewed chart, pertinent data, examined the patient and developed the plan of care. Pager:  [phone]

## 2012-03-18 NOTE — Progress Notes (Signed)
  Echocardiogram 2D Echocardiogram has been performed.  Shaun Pugh 03/18/2012, 12:21 PM

## 2012-03-18 NOTE — Progress Notes (Signed)
SLP Cancellation Note  Patient Details Name: BENSYN BORNEMANN MRN: [id number] DOB: 1951-01-07   Cancelled treatment:       Reason Eval/Treat Not Completed: Medical issues which prohibited therapy. Per RN, patient not appropriate mentally for evaluation this pm. Will f/u 11/9.   Ferdinand Lango MA, CCC-SLP [phone]    Ferdinand Lango Meryl 03/18/2012, 1:36 PM

## 2012-03-18 NOTE — Progress Notes (Signed)
Patient has increased temp despite tylenol and ice packs. Stroke NP notified of temp. Blood cultures orders. Will continue to monitor.

## 2012-03-19 ENCOUNTER — Inpatient Hospital Stay (HOSPITAL_COMMUNITY): Payer: Medicare Other

## 2012-03-19 DIAGNOSIS — I7410 Embolism and thrombosis of unspecified parts of aorta: Secondary | ICD-10-CM | POA: Diagnosis present

## 2012-03-19 DIAGNOSIS — G936 Cerebral edema: Secondary | ICD-10-CM | POA: Diagnosis not present

## 2012-03-19 LAB — BLOOD GAS, ARTERIAL
Bicarbonate: 21.3 mEq/L (ref 20.0–24.0)
O2 Saturation: 93.1 %
Patient temperature: 98.6
TCO2: 22.3 mmol/L (ref 0–100)

## 2012-03-19 MED ORDER — ENOXAPARIN SODIUM 40 MG/0.4ML ~~LOC~~ SOLN
40.0000 mg | SUBCUTANEOUS | Status: DC
Start: 2012-03-19 — End: 2012-03-23
  Administered 2012-03-19 – 2012-03-23 (×5): 40 mg via SUBCUTANEOUS
  Filled 2012-03-19 (×5): qty 0.4

## 2012-03-19 MED ORDER — IPRATROPIUM BROMIDE 0.02 % IN SOLN: 0.5000 mg | RESPIRATORY_TRACT | Status: DC | PRN

## 2012-03-19 MED ORDER — ALBUTEROL SULFATE (5 MG/ML) 0.5% IN NEBU: 2.5000 mg | INHALATION_SOLUTION | RESPIRATORY_TRACT | Status: DC | PRN

## 2012-03-19 NOTE — Evaluation (Signed)
Clinical/Bedside Swallow Evaluation Patient Details  Name: Shaun Pugh MRN: [id number] Date of Birth: 1950-07-09  Today's Date: 03/19/2012 Time: 2130-8657 SLP Time Calculation (min): 15 min  Past Medical History:  Past Medical History  Diagnosis Date  . High cholesterol   . Atrophy of right kidney 09/15/2011  . Kidney calculi 09/15/2011  . Acute pancreatitis 09/14/2011  . Cholelithiasis 09/14/2011  . Chronic back pain   . Sleep apnea     STOP BANG 5  . TIA (transient ischemic attack)   . CAD (coronary artery disease)   . Stented coronary artery approx 2004   Past Surgical History:  Past Surgical History  Procedure Date  . Back surgery     x 4   . Cholecystectomy 09/25/2011    Procedure: LAPAROSCOPIC CHOLECYSTECTOMY;  Surgeon: Fabio Bering, MD;  Location: [facility];  Service: General;  Laterality: N/A;   HPI:  61 y/o male with complicated medical history over the last 6 months. Patient diagnosded with retinal artery occulusion and placed on ASA in July of 2013.  Recently seen by opthalologist who diagnosed him with multiple small embolisms in right retina.  Patient was admitted on 03-17-12 by EMS for left sided weakness and numbness with slurred speech. Patient admitted as Code Stroke and administered t-PA . Initial imaging confirms large patchy right MCA infarct.  New onset seizure on 03/18/12.  Patient referred for BSE per stroke protocol.     Assessment / Plan / Recommendation Clinical Impression  Moderate oral and pharyngeal dysphagia indicated mainly due to weakness, intermittent LOA, and decreased cognitive skills.  Dentures placed prior to PO trials but removed due to loose fit.  Oral phase affected by left side facial, labial, and lingual weakness.  Oral awareness fluctuated with LOA.  Patient able to respond to questions but had eyes closed throughout evaluation. Pharyngeal phase characterized by delay in initiation with s/s of penetration vs. Aspiration moderately after swallow of  trial ice chips, thin water by spoon and cup, and nectar thick liquids by cup.  Patient impulsive and tried to recline in bed with puree consistency in his mouth.  No outward s/s of puree trial but with third attempt  patient "spit out " trial applesauce.  Spouse present during evaluation confirmed he would " probably spit out applesauce" prior to stroke.  Recommend to continue NPO status as patient with reduced ability to protect airway due to LOA and cognitive deficits.  RN to page treating SLP if LOA improves this pm.  Sharee Pimple ability to resume PO diet good.  ST to follow in acute care setting to reassess swallow for PO readiness.      Aspiration Risk  Moderate    Diet Recommendation NPO   Medication Administration: Via alternative means    Other  Recommendations Oral Care Recommendations: Oral care QID   Follow Up Recommendations    CIR consult   Frequency and Duration min 2x/week  2 weeks       SLP Swallow Goals Goal #3: Patient will consume diagnostic PO trials of various consistencies administered by SLP with increased sustained attention to complete swallow strategies with moderate assist.    Swallow Study Prior Functional Status       General Date of Onset: 03/17/12 HPI: 61 y/o male with complicated medical history over the last 6 months. Patient diagnosded with retinal artery occulusion and placed on ASA in July of 2013.  Recently seen by opthalologist who diagnosed him with multiple small embolisms in right  retina.  Patietn was admitted on 03-17-12 by EMS for left sided weakness and numbness with slurred speech. Patient admitted as Code Stroke and administered t-PA . Initial imaging confirms large patchy right MCA infarct.  New onset seizure on 03/18/12.  Patient referred for BSE per stroke protocol.   Diet Prior to this Study: NPO Temperature Spikes Noted: No Respiratory Status: Supplemental O2 delivered via (comment) History of Recent Intubation: No Behavior/Cognition:  Confused;Impulsive;Lethargic;Distractible;Requires cueing;Decreased sustained attention Oral Cavity - Dentition: Dentures, top;Dentures, bottom Self-Feeding Abilities: Total assist Patient Positioning: Other (comment) (EOB assisted by PT) Baseline Vocal Quality: Hoarse;Low vocal intensity Volitional Cough: Cognitively unable to elicit Volitional Swallow: Unable to elicit    Oral/Motor/Sensory Function Overall Oral Motor/Sensory Function: Impaired Labial ROM: Reduced left Labial Symmetry: Abnormal symmetry left Labial Strength: Reduced Labial Sensation: Reduced Lingual ROM: Reduced left Lingual Symmetry: Abnormal symmetry left Lingual Strength: Reduced Lingual Sensation: Reduced Facial ROM: Reduced left Facial Symmetry: Left droop Facial Strength: Reduced Facial Sensation: Reduced Velum: Impaired left Mandible: Within Functional Limits   Ice Chips Ice chips: Impaired Oral Phase Impairments: Reduced labial seal;Reduced lingual movement/coordination;Poor awareness of bolus Oral Phase Functional Implications: Prolonged oral transit Pharyngeal Phase Impairments: Suspected delayed Swallow;Decreased hyoid-laryngeal movement;Throat Clearing - Delayed   Thin Liquid Thin Liquid: Impaired Presentation: Spoon;Cup Oral Phase Impairments: Impaired anterior to posterior transit;Poor awareness of bolus Oral Phase Functional Implications: Left anterior spillage Pharyngeal  Phase Impairments: Suspected delayed Swallow;Decreased hyoid-laryngeal movement;Cough - Delayed    Nectar Thick Nectar Thick Liquid: Impaired Presentation: Cup Oral Phase Impairments: Poor awareness of bolus Oral phase functional implications: Left anterior spillage;Prolonged oral transit Pharyngeal Phase Impairments: Suspected delayed Swallow;Decreased hyoid-laryngeal movement;Throat Clearing - Delayed   Honey Thick Honey Thick Liquid: Not tested   Puree Puree: Impaired Oral Phase Impairments: Reduced lingual  movement/coordination;Impaired anterior to posterior transit;Poor awareness of bolus Oral Phase Functional Implications: Left anterior spillage Pharyngeal Phase Impairments: Suspected delayed Swallow;Decreased hyoid-laryngeal movement   Solid   GO    Solid: Not tested      Moreen Fowler MS, CCC-SLP [phone] [facility] 03/19/2012,10:31 AM

## 2012-03-19 NOTE — Evaluation (Signed)
Physical Therapy Evaluation Patient Details Name: Shaun Pugh MRN: [id number] DOB: 04/05/1951 Today's Date: 03/19/2012 Time: 2536-6440 PT Time Calculation (min): 35 min  PT Assessment / Plan / Recommendation Clinical Impression  Pt admitted with left sided weakness, numbness and slurred speech, tPa administered. CT scan found large patch Rt MCA branch infarcts with mild cytotoxic edema but without significant midline shift, no MRI completed yet secondary to respiratory status. Pt currently presenting with expressive aphasia, increased agitation, left sided weakness as well as possible left sided tone. Pt will benefit from skilled PT in the acute care setting in order to maximize functional mobility, strength and safety. Pt would make an excellent inpatient rehab candidate for increased mobility and strength.     PT Assessment  Patient needs continued PT services    Follow Up Recommendations  CIR;Supervision/Assistance - 24 hour    Does the patient have the potential to tolerate intense rehabilitation      Barriers to Discharge        Equipment Recommendations  Other (comment) (TBD)    Recommendations for Other Services Rehab consult   Frequency Min 4X/week    Precautions / Restrictions Precautions Precautions: Fall Restrictions Weight Bearing Restrictions: No   Pertinent Vitals/Pain Pt nonverbal. Faces 0      Mobility  Bed Mobility Bed Mobility: Supine to Sit;Sitting - Scoot to Edge of Bed;Sit to Supine Supine to Sit: 1: +2 Total assist Supine to Sit: Patient Percentage: 20% Sitting - Scoot to Edge of Bed: 1: +2 Total assist Sitting - Scoot to Edge of Bed: Patient Percentage: 10% Sit to Supine: 1: +2 Total assist Sit to Supine: Patient Percentage: 10% Details for Bed Mobility Assistance: Drawsheet assist with assist of LEs and support of trunk into sitting. Cueing for safety. Pt able to minimally assist with control of R side during transfer. No movement of LLE during  transfer Transfers Transfers: Not assessed Ambulation/Gait Ambulation/Gait Assistance: Not tested (comment) Modified Rankin (Stroke Patients Only) Pre-Morbid Rankin Score: No symptoms Modified Rankin: Severe disability    Shoulder Instructions     Exercises     PT Diagnosis: Hemiplegia non-dominant side;Difficulty walking  PT Problem List: Decreased strength;Decreased range of motion;Decreased activity tolerance;Decreased balance;Decreased mobility;Decreased cognition;Decreased knowledge of use of DME;Decreased safety awareness;Impaired tone;Obesity PT Treatment Interventions: Functional mobility training;Therapeutic activities;Therapeutic exercise;Balance training;Neuromuscular re-education;Patient/family education   PT Goals Acute Rehab PT Goals PT Goal Formulation: With family Time For Goal Achievement: 04/02/12 Potential to Achieve Goals: Fair Pt will go Supine/Side to Sit: with mod assist PT Goal: Supine/Side to Sit - Progress: Goal set today Pt will Sit at Edge of Bed: 3-5 min;with bilateral upper extremity support;with min assist PT Goal: Sit at Edge Of Bed - Progress: Goal set today Pt will go Sit to Supine/Side: with mod assist PT Goal: Sit to Supine/Side - Progress: Goal set today Pt will go Sit to Stand: with max assist PT Goal: Sit to Stand - Progress: Goal set today Pt will go Stand to Sit: with max assist PT Goal: Stand to Sit - Progress: Goal set today Pt will Transfer Bed to Chair/Chair to Bed: with +2 total assist PT Transfer Goal: Bed to Chair/Chair to Bed - Progress: Goal set today  Visit Information  Last PT Received On: 03/19/12 Assistance Needed: +2    Subjective Data  Patient Stated Goal: no goal stated; family goal to go to rehab   Prior Functioning  Home Living Lives With: Spouse Available Help at Discharge: Family;Available 24 hours/day Type  of Home: Other (Comment) (RV) Home Access: Stairs to enter Entrance Stairs-Number of Steps: 4 Entrance  Stairs-Rails:  (handle on the right side) Home Layout: One level Bathroom Shower/Tub: Walk-in shower;Door Teacher, early years/pre: No How Accessible:  (unknown) Home Adaptive Equipment: None Prior Function Level of Independence: Independent Able to Take Stairs?: Yes Driving: Yes Vocation: Retired Musician: Expressive difficulties Dominant Hand: Right    Cognition  Overall Cognitive Status: Impaired Area of Impairment: Attention;Memory;Following commands;Safety/judgement;Awareness of errors;Awareness of deficits Arousal/Alertness: Lethargic Orientation Level: Disoriented to;Place;Situation;Time Behavior During Session: Restless Current Attention Level: Focused Attention - Other Comments: pt only able to focus minimally on verbal responses for ~1-3 seconds Following Commands: Follows one step commands inconsistently Safety/Judgement: Impulsive;Decreased awareness of need for assistance Awareness of Errors: Assistance required to correct errors made;Assistance required to identify errors made Awareness of Deficits: unaware of deficits Cognition - Other Comments: pt agitated throughout session, did not want to participate. Wife states that pt is normally funny and can be inappropriate PTA    Extremity/Trunk Assessment Right Lower Extremity Assessment RLE ROM/Strength/Tone: Within functional levels RLE Sensation: WFL - Light Touch Left Lower Extremity Assessment LLE ROM/Strength/Tone: Deficits;Unable to fully assess;Due to impaired cognition LLE ROM/Strength/Tone Deficits: Pt unable to follow commands to assess strength secondary to cognition. Moderate movement into flexion and extension. Increased tone noted.  LLE Sensation: Deficits (UTA secondary to cognitino) LLE Sensation Deficits: minimal responses when pinched or tickled. UTA fully secondary to cognition   Balance Balance Balance Assessed: Yes Static Sitting Balance Static Sitting  - Balance Support: Bilateral upper extremity supported;Feet supported Static Sitting - Level of Assistance: 2: Max assist Static Sitting - Comment/# of Minutes: Pt sitting at EOB for ~15 minutes during bedside swallow exam with SLP. Pt able to maintain balance using RUE for supoprt with strong posterior lean requiring assist through turnk and pelvis for upright posture.   End of Session PT - End of Session Equipment Utilized During Treatment: Oxygen Activity Tolerance: Treatment limited secondary to agitation;Patient limited by fatigue Patient left: in bed;with family/visitor present;with nursing in room Nurse Communication: Mobility status;Other (comment) (RN present for session)   Milana Kidney 03/19/2012, 11:42 AM  03/19/2012 Milana Kidney DPT PAGER: [phone] OFFICE: [phone]

## 2012-03-19 NOTE — Procedures (Signed)
EEG NUMBER:  REFERRING PHYSICIAN:  Dr. Roseanne Reno.  HISTORY:  A 61 year old male with new onset seizures.  MEDICATIONS:  Dilaudid, Phenergan, Ativan.  CONDITIONS OF RECORDING:  This is a 16-channel EEG carried out with the patient in the unresponsive state.  DESCRIPTION:  The background activity at its max over the parieto- occipital and posterotemporal region achieves an 8 Hz alpha activity, although this is poorly sustained.  The background is often slower in a theta and high delta frequency range.  This slow activity is predominant in the central and temporal regions, and there is an absence of faster activities anteriorly.  There is also seems some asymmetry in the hemispheres with there being decreased voltage over the right hemisphere.  Also, noted over the left hemisphere is sharp activity with phase reversal at [facility].  This is seen frequently throughout the tracing. There was 1 episode during the tracing when the patient had left arm twitching.  No EEG correlate was noted.  No evidence of stage II sleep was noted.  Hyperventilation was not performed.  Intermittent photic stimulation failed to elicit any change in the tracing.  IMPRESSION:  This is an abnormal EEG secondary to increased voltage and sharp activity over the left hemisphere with phase reversal at [facility].  This finding is consistent with the patient's history of stroke and new onset seizure.          ______________________________ Thana Farr, MD    ZO:[id number] D:  03/19/2012 07:28:36  T:  03/19/2012 07:57:04  Job #:  [id number]

## 2012-03-19 NOTE — Progress Notes (Signed)
Stroke Team Progress Note  HISTORY Shaun Pugh is an 61 y.o. male who has a complicated history over the last 6 months. Back in July patient was having visual issues with his left visual field. He had seen a opthalmologist who diagnosed him with a retinal artery occlusion. He was placed on ASA at that time. Since that date he has been having issues with dizziness and headaches off and on. He recently was seen by opthalmologist who stated he visualized multiple small embolisms in right retina and then 2 weeks ago was seen by neurologist at [facility] who was concerned for possible strokes/TIA's due to his symptoms of dizziness and HA. These symptoms were not persistent. No specific diagnosis of CVA was made at that time. He was scheduled to have a MRI of brain (he is claustrophobia) as out patient next Monday. Today 03/17/2012 he has complained of a HA at 9 AM but this cleared over time. At 11AM He again complained of HA, left sided weakness and numbness along with slurred speech. EMS was called and patient was brought to ED as Code stroke. In depth discussion was made with wife about all the risks and benefits. Decision was made to administer tPA. He was admitted to the neuro ICU for further evaluation and treatment.patient saw opthalmologist this week who noticed new retinal emboli on eye exam as per his wife.  SUBJECTIVE His wife and son are at the bedside.  Overall he feels his condition is gradually worsening. Less combatitive and agitated over night.   New onset  Focal seizures involving left upper extremity now controlled with keppra. Had temp spike y`day stated on Zosyn for presumed aspiration. Temp remains high but controlled on cooling blanket. CT head this am shows evolving large patch Rt MCA branch infarcts with mild cytotoxic edema but without significant midline shift. No hemorrhage noted.BP adequately controlled. OBJECTIVE Most recent Vital Signs: Filed Vitals:   03/19/12 0500  03/19/12 0600 03/19/12 0700 03/19/12 0800  BP: 168/70 178/71 168/61 163/77  Pulse: 79 96 105 80  Temp:      TempSrc:      Resp: 15 18 22 17   Height:      Weight:      SpO2: 98% 96% 99% 99%   IV Fluid Intake:      . sodium chloride 75 mL/hr at 03/19/12 0800    MEDICATIONS     . aspirin  300 mg Rectal Daily  . [COMPLETED] levetiracetam  1,000 mg Intravenous STAT  . levetiracetam  1,000 mg Intravenous Q12H  . [EXPIRED] LORazepam      . pantoprazole (PROTONIX) IV  40 mg Intravenous QHS  . piperacillin-tazobactam (ZOSYN)  IV  3.375 g Intravenous Q8H  . [COMPLETED] levetiracetam  1,000 mg Intravenous Once  . [DISCONTINUED] levetiracetam  500 mg Intravenous Q12H   PRN:  acetaminophen, acetaminophen, albuterol, HYDROmorphone (DILAUDID) injection, ipratropium, labetalol, ondansetron (ZOFRAN) IV, senna-docusate, traMADol  Diet:  NPO  Activity:  Bedrest DVT Prophylaxis:  SCDs   CLINICALLY SIGNIFICANT STUDIES Basic Metabolic Panel:   Lab 03/17/12 1353 03/17/12 1315  NA 140 135  K 3.9 3.9  CL 106 99  CO2 -- 22  GLUCOSE 106* 107*  BUN 19 18  CREATININE 1.20 1.16  CALCIUM -- 9.7  MG -- --  PHOS -- --   Liver Function Tests:   Lab 03/17/12 1315  AST 15  ALT 19  ALKPHOS 76  BILITOT 0.4  PROT 8.0  ALBUMIN 3.9  CBC:   Lab 03/17/12 1353 03/17/12 1315  WBC -- 17.3*  NEUTROABS -- 9.3*  HGB 15.6 15.7  HCT 46.0 44.0  MCV -- 88.2  PLT -- 207   Coagulation:   Lab 03/17/12 1315  LABPROT 13.2  INR 1.01   Cardiac Enzymes:   Lab 03/17/12 1315  CKTOTAL 34  CKMB 1.6  CKMBINDEX --  TROPONINI <0.30   Urinalysis:   Lab 03/18/12 1539  COLORURINE YELLOW  LABSPEC 1.024  PHURINE 6.0  GLUCOSEU NEGATIVE  HGBUR NEGATIVE  BILIRUBINUR NEGATIVE  KETONESUR NEGATIVE  PROTEINUR NEGATIVE  UROBILINOGEN 1.0  NITRITE NEGATIVE  LEUKOCYTESUR NEGATIVE   Lipid Panel    Component Value Date/Time   CHOL 179 03/18/2012 0420   TRIG 163* 03/18/2012 0420   HDL 32* 03/18/2012  0420   CHOLHDL 5.6 03/18/2012 0420   VLDL 33 03/18/2012 0420   LDLCALC 114* 03/18/2012 0420   HgbA1C  Lab Results  Component Value Date   HGBA1C 5.9* 03/18/2012    Urine Drug Screen:   No results found for this basename: labopia,  cocainscrnur,  labbenz,  amphetmu,  thcu,  labbarb    Alcohol Level: No results found for this basename: ETH:2 in the last 168 hours  CT of the brain   03/17/2012 No acute intracranial abnormality seen. 03/17/2012  Paranasal sinus mucosal thickening and fluid in the left mastoid air cells.  Otherwise no acute intracranial pathology.     Ct Angio Head 03/17/2012   1.  Decreased enhancement of the right MCA M2 branches of the anterior sylvian division.  No major branch occlusion, no intracranial hemodynamically significant stenosis identified. Otherwise minimal to mild intracranial atherosclerosis. 2.  Subtle cortical hypodensity in the anterior right superior frontal gyrus suspicious for ischemia in this setting.  No mass effect or hemorrhage. 3.  Age indeterminate but favor chronic lacunar infarct in the right thalamus.      Ct Angio Neck 03/17/2012   1.  Ulcerated plaque at the right ICA origin resulting in 60% right ICA stenosis. 2.  Findings suspicious for 10 mm of focal thrombus in the proximal aortic arch (see axial image 6). This was discussed with Dr. Thana Farr at 1609 hours on 03/17/2012. 3.  Mild left carotid atherosclerosis.  Dominant right vertebral artery. 4.  See intracranial findings below. 5.  Small 9 mm nodule in the right parotid gland is indeterminate.    MRI of the brain  claustophobic  MRA of the brain  See CTA brain  2D Echocardiogram  Left ventricle: The cavity size was normal. Wall thickness was increased in a pattern of moderate LVH. Systolic function was vigorous. The estimated ejection fraction was in the range of 65% to 70%. Regional wall motion abnormalities cannot be excluded. Left ventricular diastolic function parameters were  normal. - Left atrium: The atrium was mildly dilated. Impressions:  - No cardiac source of emboli was indentified.    Carotid Doppler  See CTA neck  CXR   03/17/2012  Slight atelectasis at the left lung base.  EKG  normal sinus rhythm, RBBB.   EEG    Rt hemispheric slowing with focal sharp waves at F  but no definite epileptiform activity.  Therapy Recommendations PT - ; OT - ; ST -   Physical Exam   Sedated.Awake alert. Afebrile. Head is nontraumatic. Neck is supple without bruit. Hearing is normal. Cardiac exam no murmur or gallop. Lungs are clear to auscultation. Distal pulses are well felt.  Neurological Exam :  lethargic barely opens eyes and follows few simple commands. Mild right gaze preference but can look to left past midline. Decrease blink to threat bilaterally. Pupils 4 mm equal and reactive. Dysarthric speech. Tongue midline. Dense left hemiplegia with flaccidity. Minimal withdrawal to pain on left side.purposeful antigravity right sided movements present. Suspect diminished sensation to touch and pinprick on left side. Plantars left upgoing and right is downgoing.  ASSESSMENT [name] is a 60 y.o. male presenting with HA, left sided weakness and numbness along with slurred speech. 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 Initial imaging confirms large patchy right MCA infarct. Infarct felt to be embolic secondary to aortic arch thrombus.  Work up underway. On aspirin 325 mg orally every day prior to admission. Now on Aspirin 300 mg rectally   Patient with resultant agitation, lethargy, left hemiparesis, seizures, dysarthria, visual defect.  retinal artery occlusion July 2013 Concern with right retina embolisms last week Hyperlipidemia, LDL 114, on statin PTAnow, goal LDL < 100 CAD, ?MI, definite stent  placement approx 2004, on plavix following OSA New onset seizure 03/18/2012, partial, involving the arms and face No known history of atrial fibrillation  Hospital day # 2  TREATMENT/PLAN  Continue aspirin 300 mg suppository every day for secondary stroke prevention.Will not anticoagulate due to large size of stroke and risk of intracranial bleeding.. Will add plavix after NGTube or when able to swallow.CT at 1400 this afternoon  F/u 2D  Continue keppra for seizures  NPO, ST reassess swallow when more awake.If fails will need NGtube  Keep in ICU for now. Check BMP, CBC , head Ctand CXR in am. Hold MRI due to respiratory status.  D/w patient and wife and answered questions.prognosis guarded. May need intubation if respiratory status deteriorates further. This patient is critically ill and at significant risk of neurological worsening, death and care requires constant monitoring of vital signs, hemodynamics,respiratory and cardiac monitoring,review of multiple databases, neurological assessment, discussion with family, other specialists and medical decision making of high complexity. I spent 30 minutes of neurocritical care time  in the care of  this patient.   Annie Main, MSN, RN, ANVP-BC, ANP-BC, [facility] Pager: [phone] 03/19/2012 9:31 AM  Scribe for Dr. Delia Heady, Stroke Center Medical Director, who has personally reviewed chart, pertinent data, examined the patient and developed the plan of care. Pager:  [phone]

## 2012-03-19 NOTE — Evaluation (Signed)
Speech Language Pathology Evaluation Patient Details Name: Shaun Pugh MRN: [id number] DOB: Oct 02, 1950 Today's Date: 03/19/2012 Time: 6433-2951 SLP Time Calculation (min): 30 min  Problem List:  Patient Active Problem List  Diagnosis  . Acute pancreatitis  . Cholelithiasis  . Hyperglycemia  . Current every day smoker  . Atrophy of right kidney  . Kidney calculi  . Hemiplegia, unspecified, affecting nondominant side  . Stented coronary artery  . Stroke, acute, embolic, right  . Seizure  . Cytotoxic cerebral edema  . Aortic thromboembolism   Past Medical History:  Past Medical History  Diagnosis Date  . High cholesterol   . Atrophy of right kidney 09/15/2011  . Kidney calculi 09/15/2011  . Acute pancreatitis 09/14/2011  . Cholelithiasis 09/14/2011  . Chronic back pain   . Sleep apnea     STOP BANG 5  . TIA (transient ischemic attack)   . CAD (coronary artery disease)   . Stented coronary artery approx 2004   Past Surgical History:  Past Surgical History  Procedure Date  . Back surgery     x 4   . Cholecystectomy 09/25/2011    Procedure: LAPAROSCOPIC CHOLECYSTECTOMY;  Surgeon: Fabio Bering, MD;  Location: [facility];  Service: General;  Laterality: N/A;   HPI:  61 y/o male admitted to ED by EMS with left-sided weakness and numbness with slurred speech. Patient admitted as Code Stroke administered t-PA.  Initial imaging confirms large patchy right MCA infarct. New onset seizure on 03/18/12.  Patient referred for Cognitive Linguistic evaluation per stroke protocol.    Assessment / Plan / Recommendation Clinical Impression  Severe cognitive deficits in areas of safety awareness, initiation, basic problem solving, sustained attention, and sequencing.  Recommend skilled ST in acute care setting to address cognitive deficits to improve safety in current setting.      SLP Assessment  Patient needs continued Speech Lanaguage Pathology Services    Follow Up Recommendations  Inpatient Rehab    Frequency and Duration min 2x/week  2 weeks      SLP Goals  SLP Goals Potential to Achieve Goals: Good Potential Considerations: Previous level of function;Family/community support Progress/Goals/Alternative treatment plan discussed with pt/caregiver and they: Agree SLP Goal #1: Increase orientation to location, date, situation with moderate verbal cues to utilize visual aids SLP Goal #2: Increase intellectual awareness by correctly stating current deficits with moderate verbal cues.  SLP Goal #3: Complete functional problem solving tasks with moderate verbal and tactile cues to maintain sustained attention.   SLP Evaluation Prior Functioning  Cognitive/Linguistic Baseline: Within functional limits Type of Home: Other (Comment) (RV) Lives With: Spouse Available Help at Discharge: Available 24 hours/day Education: 12 th grade education  Vocation: Retired   IT consultant  Overall Cognitive Status: Impaired Arousal/Alertness: Lethargic Orientation Level: Oriented to person Attention: Focused Focused Attention: Impaired Focused Attention Impairment: Verbal basic;Functional basic Memory: Impaired Memory Impairment: Decreased short term memory;Retrieval deficit Decreased Short Term Memory: Verbal basic;Functional basic Awareness: Impaired Awareness Impairment: Intellectual impairment;Emergent impairment;Anticipatory impairment Problem Solving: Impaired Problem Solving Impairment: Verbal basic;Functional basic Executive Function: Reasoning;Self Monitoring;Initiating Reasoning: Impaired Reasoning Impairment: Verbal basic;Functional basic Initiating: Impaired Self Monitoring: Impaired Self Monitoring Impairment: Verbal basic;Functional basic Behaviors: Restless;Impulsive;Physical agitation;Poor frustration tolerance Safety/Judgment: Impaired    Comprehension  Auditory Comprehension Overall Auditory Comprehension: Impaired Yes/No Questions: Within Functional  Limits Commands: Impaired One Step Basic Commands: 25-49% accurate Two Step Basic Commands: 0-24% accurate Conversation: Simple Interfering Components: Attention EffectiveTechniques: Extra processing time;Repetition Visual Recognition/Discrimination Discrimination: Not tested Reading Comprehension  Reading Status: Unable to assess (comment)    Expression Expression Primary Mode of Expression: Verbal Verbal Expression Overall Verbal Expression: Appears within functional limits for tasks assessed Pragmatics: Impairment Impairments: Abnormal affect Interfering Components: Attention Effective Techniques: Open ended questions Non-Verbal Means of Communication: Not applicable Written Expression Dominant Hand: Right Written Expression: Unable to assess (comment)   Oral / Motor Oral Motor/Sensory Function Overall Oral Motor/Sensory Function: Impaired Labial ROM: Reduced left Labial Symmetry: Abnormal symmetry left Labial Strength: Reduced Labial Sensation: Reduced Lingual ROM: Reduced left Lingual Symmetry: Abnormal symmetry left Lingual Strength: Reduced Lingual Sensation: Reduced Facial ROM: Reduced left Facial Symmetry: Left droop Facial Strength: Reduced Facial Sensation: Reduced Velum: Impaired left Mandible: Within Functional Limits Motor Speech Overall Motor Speech: Impaired Phonation: Hoarse;Low vocal intensity Resonance: Hyponasality Articulation: Impaired Level of Impairment: Sentence Intelligibility: Intelligibility reduced Word: 50-74% accurate Phrase: 50-74% accurate Sentence: 25-49% accurate Conversation: 25-49% accurate Motor Planning: Impaired Level of Impairment: Secretary/administrator Errors: Unaware Effective Techniques: Slow rate;Increased vocal intensity   GO    Moreen Fowler MS, CCC-SLP [phone] [facility] 03/19/2012, 12:45 PM

## 2012-03-20 ENCOUNTER — Inpatient Hospital Stay (HOSPITAL_COMMUNITY): Payer: Medicare Other

## 2012-03-20 DIAGNOSIS — G936 Cerebral edema: Secondary | ICD-10-CM

## 2012-03-20 LAB — CBC WITH DIFFERENTIAL/PLATELET
Basophils Relative: 0 % (ref 0–1)
Eosinophils Absolute: 0.2 10*3/uL (ref 0.0–0.7)
Eosinophils Relative: 1 % (ref 0–5)
Hemoglobin: 13.3 g/dL (ref 13.0–17.0)
Lymphs Abs: 2.9 10*3/uL (ref 0.7–4.0)
MCH: 31 pg (ref 26.0–34.0)
MCHC: 34.6 g/dL (ref 30.0–36.0)
MCV: 89.5 fL (ref 78.0–100.0)
Monocytes Absolute: 1.9 10*3/uL — ABNORMAL HIGH (ref 0.1–1.0)
Platelets: 139 10*3/uL — ABNORMAL LOW (ref 150–400)
RBC: 4.29 MIL/uL (ref 4.22–5.81)

## 2012-03-20 LAB — BASIC METABOLIC PANEL
BUN: 28 mg/dL — ABNORMAL HIGH (ref 6–23)
CO2: 23 mEq/L (ref 19–32)
Calcium: 8.3 mg/dL — ABNORMAL LOW (ref 8.4–10.5)
Glucose, Bld: 99 mg/dL (ref 70–99)
Sodium: 139 mEq/L (ref 135–145)

## 2012-03-20 NOTE — Procedures (Signed)
Objective Swallowing Evaluation: Modified Barium Swallowing Study  Patient Details  Name: Shaun Pugh MRN: [id number] Date of Birth: May 27, 1950  Today's Date: 03/20/2012 Time: 1345-1400 SLP Time Calculation (min): 15 min  Past Medical History:  Past Medical History  Diagnosis Date  . High cholesterol   . Atrophy of right kidney 09/15/2011  . Kidney calculi 09/15/2011  . Acute pancreatitis 09/14/2011  . Cholelithiasis 09/14/2011  . Chronic back pain   . Sleep apnea     STOP BANG 5  . TIA (transient ischemic attack)   . CAD (coronary artery disease)   . Stented coronary artery approx 2004   Past Surgical History:  Past Surgical History  Procedure Date  . Back surgery     x 4   . Cholecystectomy 09/25/2011    Procedure: LAPAROSCOPIC CHOLECYSTECTOMY;  Surgeon: Fabio Bering, MD;  Location: [facility];  Service: General;  Laterality: N/A;   HPI:  61 y/o male admitted to ED by EMS with left-sided weakness and slurred speech. Patient admitted as Code stroke and administered t-PA.  Initial imaging confirms large patchy right MCA infarct. New onset seizure on 03/18/12.  Initial BSE completed on 03/19/12 with recommendations of NPO mainly due to decreased  LOA .  Patient reassessed bedside for PO readiness this am with recommendations to proceed with MBS to objectively assess risk for aspiration.       Assessment / Plan / Recommendation Clinical Impression  Dysphagia Diagnosis: Moderate oral phase dysphagia Clinical impression: Moderate sensory motor oral dysphagia marked by decreased lingual strength and coordination on left.  Mastication affected by loose fitting top dentures.  Bolus cohesion reduced with piecemeal swallows with all consistencies.  No penetration or aspiration noted throughout evaluation but note SLP administered all PO trials due to left upper extremity weakness and decreased cognition to follow directions.  Recommend to proceed with dysphagia 3 and thin liquids with full  supervison with all meals due to noted decreased problem solving skills and safety awareness.  ST to follow in acute care setting for diet tolerance. No repeat MBS warranted at this time.      Treatment Recommendation       Diet Recommendation Dysphagia 3 (Mechanical Soft);Thin liquid   Liquid Administration via: Cup;No straw Medication Administration: Whole meds with puree Supervision: Full supervision/cueing for compensatory strategies;Patient able to self feed Compensations: Slow rate;Small sips/bites;Check for pocketing Postural Changes and/or Swallow Maneuvers: Seated upright 90 degrees;Upright 30-60 min after meal    Other  Recommendations Other Recommendations: Clarify dietary restrictions   Follow Up Recommendations  Inpatient Rehab    Frequency and Duration min 2x/week  2 weeks       SLP Swallow Goals Patient will consume recommended diet without observed clinical signs of aspiration with: Moderate assistance Patient will utilize recommended strategies during swallow to increase swallowing safety with: Moderate assistance Swallow Study Goal #3 - Progress: Met   General Date of Onset: 03/17/12 HPI: 61 y/o male admitted to ED by EMS with left-sided weakness and slurred speech. Patient admitted as Code stroke and administered t-PA.  Initial imaging confirms large patchy right MCA infarct. New onset seizure on 03/18/12.  Initial BSE completed on 03/19/12 with recommendations of NPO mainly due to decreased  LOA .  Patient reassessed bedside for PO readiness this am with recommendations to proceed with MBS to objectively assess risk for aspiration.   Type of Study: Modified Barium Swallowing Study Reason for Referral: Objectively evaluate swallowing function Previous Swallow Assessment: BSE 03/19/12  Diet Prior to this Study: NPO Temperature Spikes Noted: No History of Recent Intubation: No Behavior/Cognition: Alert;Cooperative;Pleasant mood;Confused;Decreased sustained  attention;Distractible;Requires cueing Oral Cavity - Dentition: Dentures, top;Dentures, bottom Oral Motor / Sensory Function: Impaired - see Bedside swallow eval Self-Feeding Abilities: Total assist Patient Positioning: Upright in chair Baseline Vocal Quality: Clear;Low vocal intensity Volitional Cough: Strong Volitional Swallow: Able to elicit Anatomy: Within functional limits Pharyngeal Secretions: Not observed secondary MBS    Reason for Referral Objectively evaluate swallowing function   Oral Phase Oral Preparation/Oral Phase Oral Phase: Impaired Oral - Thin Oral - Thin Cup: Lingual pumping;Incomplete tongue to palate contact;Piecemeal swallowing;Lingual/palatal residue Oral - Solids Oral - Mechanical Soft: Impaired mastication;Left pocketing in lateral sulci;Delayed oral transit Oral - Regular: Incomplete tongue to palate contact;Reduced posterior propulsion;Delayed oral transit;Impaired mastication;Piecemeal swallowing   Pharyngeal Phase Pharyngeal Phase Pharyngeal Phase: Within functional limits  Cervical Esophageal Phase    GO     Moreen Fowler MS, CCC-SLP [phone] Cervical Esophageal Phase Cervical Esophageal Phase: Impaired Cervical Esophageal Phase - Solids Regular: Esophageal backflow into cervical esophagus Cervical Esophageal Phase - Comment Cervical Esophageal Comment: Slow bolus transit          [facility] 03/20/2012, 2:51 PM

## 2012-03-20 NOTE — Progress Notes (Signed)
TCD completed. 

## 2012-03-20 NOTE — Progress Notes (Signed)
Speech Language Pathology Treatment Patient Details Name: Shaun Pugh MRN: [id number] DOB: Sep 19, 1950 Today's Date: 03/20/2012 Time: 1030-1100 SLP Time Calculation (min): 30 min  Assessment / Plan / Recommendation Clinical Impression      SLP Plan  Continue with current plan of care       SLP Goals  SLP Goals Potential to Achieve Goals: Good Potential Considerations: Previous level of function;Family/community support Progress/Goals/Alternative treatment plan discussed with pt/caregiver and they: Agree SLP Goal #1 - Progress: Progressing toward goal  General Temperature Spikes Noted: No Behavior/Cognition: Cooperative;Alert;Pleasant mood;Decreased sustained attention;Distractible;Requires cueing Oral Cavity - Dentition: Dentures, top;Dentures, bottom (top loose fit) Patient Positioning: Upright in bed  Oral Cavity - Oral Hygiene Does patient have any of the following "at risk" factors?: Nutritional status - inadequate Patient is HIGH RISK - Oral Care Protocol followed (see row info): Yes   Treatment Treatment focused on: Cognition;Patient/family/caregiver education;Other (comment) (reassess swallow for PO readiness) Family/Caregiver Educated: Daughter  Skilled Treatment: Treatment focused on reassessing swallow for PO readiness and to address cognitive deficits.  Patient with eyes opened and alert this treatment which is dramatic improvement from initial BSE.  Patient observed with thin water by cup and  magic cup (patient dislikes applesauce) with full assist for feeding due to left side upper extremity weakness and decreased sustained attention.  Wet cough moderately after swallow of thin liquids.  Unable to differeniate aspiration as patient with baseline cough.  Improvement noted in area of orientation from 11/9/ but patient now noted with severe left side neglect.  Due to s/s present during PO trials recommend to proceed with objective evaluation of MBS to assess risk for  aspiration and recommend safest, possible PO diet.     GO    Moreen Fowler MS, CCC-SLP [phone] [facility] 03/20/2012, 2:31 PM

## 2012-03-20 NOTE — Progress Notes (Signed)
Physical Therapy Treatment Patient Details Name: Shaun Pugh MRN: [id number] DOB: 1950-07-31 Today's Date: 03/20/2012 Time: 1478-2956 PT Time Calculation (min): 24 min  PT Assessment / Plan / Recommendation Comments on Treatment Session  Pt more alert this session and conversive. Noted pt with extensive left side neglect during all movement. Educated pt and wife on importance of awareness of L side throughot the day. Transferred pt to the chair with maxisky, pt tolerated well. Continue per plan, increased weightbearing through L side tomorrow.     Follow Up Recommendations  CIR;Supervision/Assistance - 24 hour     Does the patient have the potential to tolerate intense rehabilitation     Barriers to Discharge        Equipment Recommendations  Other (comment) (maximove)    Recommendations for Other Services Rehab consult  Frequency Min 4X/week   Plan Discharge plan remains appropriate;Frequency remains appropriate    Precautions / Restrictions Precautions Precautions: Fall Restrictions Weight Bearing Restrictions: No   Pertinent Vitals/Pain No complaints of pain    Mobility  Bed Mobility Bed Mobility: Rolling Right;Rolling Left Rolling Right: 1: +2 Total assist Rolling Right: Patient Percentage: 10% Rolling Left: 1: +2 Total assist Rolling Left: Patient Percentage: 30% Details for Bed Mobility Assistance: Assist with drawsheet rolling x 2 during hygiene and to place pad for lift. Pt able to assist more with rolling to the left using R side. No trunk control Transfers Transfers: Not assessed Transfer via Lift Equipment: Maxisky Details for Transfer Assistance: Transferred pt from bed to chair with maxisky with assist x 3 for safety Ambulation/Gait Ambulation/Gait Assistance: Not tested (comment)    Exercises     PT Diagnosis:    PT Problem List:   PT Treatment Interventions:     PT Goals Acute Rehab PT Goals PT Transfer Goal: Bed to Chair/Chair to Bed -  Progress: Progressing toward goal  Visit Information  Last PT Received On: 03/20/12 Assistance Needed: +2    Subjective Data      Cognition  Arousal/Alertness: Awake/alert Orientation Level: Oriented X4 / Intact Behavior During Session: [facility] for tasks performed Current Attention Level: Focused Attention - Other Comments: Increased attention this session Following Commands: Follows multi-step commands consistently Safety/Judgement: Decreased safety judgement for tasks assessed Awareness of Errors: Assistance required to correct errors made;Assistance required to identify errors made Awareness of Deficits: unaware of deficits Cognition - Other Comments: Increased awareness this session. Left neglect    Balance     End of Session PT - End of Session Equipment Utilized During Treatment: Other (comment) (maxisky) Activity Tolerance: Patient tolerated treatment well Patient left: in chair;with call bell/phone within reach;with nursing in room;with family/visitor present Nurse Communication: Mobility status;Need for lift equipment   GP     Milana Kidney 03/20/2012, 3:43 PM

## 2012-03-20 NOTE — Progress Notes (Signed)
Stroke Team Progress Note  HISTORY Shaun Pugh is an 61 y.o. male who has a complicated history over the last 6 months. Back in July patient was having visual issues with his left visual field. He had seen a opthalmologist who diagnosed him with a retinal artery occlusion. He was placed on ASA at that time. Since that date he has been having issues with dizziness and headaches off and on. He recently was seen by opthalmologist who stated he visualized multiple small embolisms in right retina and then 2 weeks ago was seen by neurologist at [facility] who was concerned for possible strokes/TIA's due to his symptoms of dizziness and HA. These symptoms were not persistent. No specific diagnosis of CVA was made at that time. He was scheduled to have a MRI of brain (he is claustrophobia) as out patient next Monday. Today 03/17/2012 he has complained of a HA at 9 AM but this cleared over time. At 11AM He again complained of HA, left sided weakness and numbness along with slurred speech. EMS was called and patient was brought to ED as Code stroke. In depth discussion was made with wife about all the risks and benefits. Decision was made to administer tPA. He was admitted to the neuro ICU for further evaluation and treatment.patient saw opthalmologist this week who noticed new retinal emboli on eye exam as per his wife.  SUBJECTIVE His wife and son are at the bedside.  Overall he feels his condition is gradually improving Less combatitive and agitated over night.      No seizures x 2 days. CXR shows left lower lobe infiltrate. CT head this am stable left pareital and frontal MCA branch infarcts with mild cytotoxic edema but no midline shift. Temp remains high but controlled on cooling blanket.  No hemorrhage noted.BP adequately controlled.Speech pathology eval pending this am. BMP stable. WBC down from 17.3 to 16.1. OBJECTIVE Most recent Vital Signs: Filed Vitals:   03/20/12 0400 03/20/12 0500 03/20/12  0600 03/20/12 0700  BP: 116/64 100/41 122/75 116/73  Pulse: 89 73 70 73  Temp: 98.3 F (36.8 C)     TempSrc: Rectal     Resp: 17 16 15 14   Height:      Weight:      SpO2: 98% 100% 94% 97%   IV Fluid Intake:      . sodium chloride 75 mL/hr at 03/19/12 2325    MEDICATIONS     . aspirin  300 mg Rectal Daily  . enoxaparin (LOVENOX) injection  40 mg Subcutaneous Q24H  . levetiracetam  1,000 mg Intravenous Q12H  . pantoprazole (PROTONIX) IV  40 mg Intravenous QHS  . piperacillin-tazobactam (ZOSYN)  IV  3.375 g Intravenous Q8H   PRN:  acetaminophen, acetaminophen, albuterol, HYDROmorphone (DILAUDID) injection, ipratropium, labetalol, ondansetron (ZOFRAN) IV, senna-docusate, traMADol  Diet:  NPO  Activity:  Bedrest DVT Prophylaxis:  SCDs   CLINICALLY SIGNIFICANT STUDIES Basic Metabolic Panel:   Lab 03/20/12 0535 03/17/12 1353 03/17/12 1315  NA 139 140 --  K 3.6 3.9 --  CL 107 106 --  CO2 23 -- 22  GLUCOSE 99 106* --  BUN 28* 19 --  CREATININE 0.95 1.20 --  CALCIUM 8.3* -- 9.7  MG -- -- --  PHOS -- -- --   Liver Function Tests:   Lab 03/17/12 1315  AST 15  ALT 19  ALKPHOS 76  BILITOT 0.4  PROT 8.0  ALBUMIN 3.9   CBC:   Lab 03/20/12 0535  03/17/12 1353 03/17/12 1315  WBC 16.1* -- 17.3*  NEUTROABS 11.1* -- 9.3*  HGB 13.3 15.6 --  HCT 38.4* 46.0 --  MCV 89.5 -- 88.2  PLT 139* -- 207   Coagulation:   Lab 03/17/12 1315  LABPROT 13.2  INR 1.01   Cardiac Enzymes:   Lab 03/17/12 1315  CKTOTAL 34  CKMB 1.6  CKMBINDEX --  TROPONINI <0.30   Urinalysis:   Lab 03/18/12 1539  COLORURINE YELLOW  LABSPEC 1.024  PHURINE 6.0  GLUCOSEU NEGATIVE  HGBUR NEGATIVE  BILIRUBINUR NEGATIVE  KETONESUR NEGATIVE  PROTEINUR NEGATIVE  UROBILINOGEN 1.0  NITRITE NEGATIVE  LEUKOCYTESUR NEGATIVE   Lipid Panel    Component Value Date/Time   CHOL 179 03/18/2012 0420   TRIG 163* 03/18/2012 0420   HDL 32* 03/18/2012 0420   CHOLHDL 5.6 03/18/2012 0420   VLDL 33  03/18/2012 0420   LDLCALC 114* 03/18/2012 0420   HgbA1C  Lab Results  Component Value Date   HGBA1C 5.9* 03/18/2012    Urine Drug Screen:   No results found for this basename: labopia,  cocainscrnur,  labbenz,  amphetmu,  thcu,  labbarb    Alcohol Level: No results found for this basename: ETH:2 in the last 168 hours  CT of the brain   03/17/2012 No acute intracranial abnormality seen. 03/17/2012  Paranasal sinus mucosal thickening and fluid in the left mastoid air cells.  Otherwise no acute intracranial pathology.     Ct Angio Head 03/17/2012   1.  Decreased enhancement of the right MCA M2 branches of the anterior sylvian division.  No major branch occlusion, no intracranial hemodynamically significant stenosis identified. Otherwise minimal to mild intracranial atherosclerosis. 2.  Subtle cortical hypodensity in the anterior right superior frontal gyrus suspicious for ischemia in this setting.  No mass effect or hemorrhage. 3.  Age indeterminate but favor chronic lacunar infarct in the right thalamus.      Ct Angio Neck 03/17/2012   1.  Ulcerated plaque at the right ICA origin resulting in 60% right ICA stenosis. 2.  Findings suspicious for 10 mm of focal thrombus in the proximal aortic arch (see axial image 6). This was discussed with Dr. Thana Farr at 1609 hours on 03/17/2012. 3.  Mild left carotid atherosclerosis.  Dominant right vertebral artery. 4.  See intracranial findings below. 5.  Small 9 mm nodule in the right parotid gland is indeterminate.    MRI of the brain  claustophobic  MRA of the brain  See CTA brain  2D Echocardiogram  Left ventricle: The cavity size was normal. Wall thickness was increased in a pattern of moderate LVH. Systolic function was vigorous. The estimated ejection fraction was in the range of 65% to 70%. Regional wall motion abnormalities cannot be excluded. Left ventricular diastolic function parameters were normal. - Left atrium: The atrium was mildly  dilated. Impressions:  - No cardiac source of emboli was indentified.    Carotid Doppler  See CTA neck  CXR   03/17/2012  Slight atelectasis at the left lung base.  EKG  normal sinus rhythm, RBBB.   EEG    Rt hemispheric slowing with focal sharp waves at F  but no definite epileptiform activity.  Therapy Recommendations PT - ; OT - ; ST -   Physical Exam   Sedated.Awake alert. Afebrile. Head is nontraumatic. Neck is supple without bruit. Hearing is normal. Cardiac exam no murmur or gallop. Lungs are clear to auscultation. Distal pulses are well felt.  Neurological  Exam : Alert and able to open eyes  Easily and follows few simple commands. Mild right gaze preference but can look barely to left past midline. Decrease blink to threat on left. Pupils 4 mm equal and reactive. Dysarthric speech. Tongue midline. Dense left hemiplegia with flaccidity.mild left hemineglect and sensory loss present. Minimal withdrawal to pain on left side.purposeful antigravity right sided movements present.   Plantars left upgoing and right is downgoing.  ASSESSMENT Mr. KEVANTE LUNT is a 61 y.o. male presenting with HA, left sided weakness and numbness along with slurred speech. 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 Initial imaging confirms large patchy right MCA infarct. Infarct felt to be embolic secondary to aortic arch thrombus.CT shows 2 separate left MCA pareital and frontal branch infarcts suspect of different ages..   On aspirin 325 mg orally every day prior to admission. Now on Aspirin 300 mg rectally   Patient with resultant agitation, lethargy, left hemiparesis, seizures, dysarthria, visual defect.  retinal artery occlusion July 2013 Concern with right retina embolisms last week Hyperlipidemia, LDL 114, on statin PTAnow, goal LDL < 100 CAD,  ?MI, definite stent placement approx 2004, on plavix following OSA New onset seizure 03/18/2012, partial, involving the arms and face No known history of atrial fibrillation  Hospital day # 3  TREATMENT/PLAN  Continue aspirin 300 mg suppository every day for secondary stroke prevention.Will not anticoagulate due to large size of stroke and risk of intracranial bleeding.. Will add plavix after NGTube or when able to swallow.CT at 1400 this afternoon  F/u 2D  Continue keppra for seizures  NPO, ST reassess swallow when more awake.If fails will need NGtube  Keep in ICU for now.   Hold MRI due to respiratory status.  Check carotid and Transcranial dopplers to image vascular patency as infarcts suggest reocclusion after TPA  D/w patient and wife and answered questions.prognosis guarded. May need intubation if respiratory status deteriorates further. This patient is critically ill and at significant risk of neurological worsening, death and care requires constant monitoring of vital signs, hemodynamics,respiratory and cardiac monitoring,review of multiple databases, neurological assessment, discussion with family, other specialists and medical decision making of high complexity. I spent 30 minutes of neurocritical care time  in the care of  this patient.   Delia Heady, MD Medical Director [facility] Pager: [phone] 03/20/2012 9:32 AM   03/20/2012 9:25 AM

## 2012-03-21 DIAGNOSIS — I7411 Embolism and thrombosis of thoracic aorta: Secondary | ICD-10-CM

## 2012-03-21 DIAGNOSIS — I6340 Cerebral infarction due to embolism of unspecified cerebral artery: Secondary | ICD-10-CM

## 2012-03-21 DIAGNOSIS — G8110 Spastic hemiplegia affecting unspecified side: Secondary | ICD-10-CM

## 2012-03-21 LAB — URINE MICROSCOPIC-ADD ON

## 2012-03-21 LAB — URINALYSIS, ROUTINE W REFLEX MICROSCOPIC
Bilirubin Urine: NEGATIVE
Ketones, ur: NEGATIVE mg/dL
Protein, ur: NEGATIVE mg/dL
Urobilinogen, UA: 4 mg/dL — ABNORMAL HIGH (ref 0.0–1.0)

## 2012-03-21 MED ORDER — ATORVASTATIN CALCIUM 20 MG PO TABS
20.0000 mg | ORAL_TABLET | Freq: Every day | ORAL | Status: DC
  Administered 2012-03-22 – 2012-03-23 (×2): 20 mg via ORAL
  Filled 2012-03-21 (×3): qty 1

## 2012-03-21 MED ORDER — SODIUM CHLORIDE 0.9 % IV SOLN
750.0000 mg | Freq: Once | INTRAVENOUS | Status: AC
  Administered 2012-03-21: 750 mg via INTRAVENOUS
  Filled 2012-03-21: qty 7.5

## 2012-03-21 MED ORDER — SODIUM CHLORIDE 0.9 % IV SOLN
1250.0000 mg | Freq: Two times a day (BID) | INTRAVENOUS | Status: DC
  Administered 2012-03-22 – 2012-03-23 (×3): 1250 mg via INTRAVENOUS
  Filled 2012-03-21 (×4): qty 12.5

## 2012-03-21 MED ORDER — ASPIRIN EC 325 MG PO TBEC
325.0000 mg | DELAYED_RELEASE_TABLET | Freq: Every day | ORAL | Status: DC
  Administered 2012-03-21 – 2012-03-23 (×3): 325 mg via ORAL
  Filled 2012-03-21 (×4): qty 1

## 2012-03-21 MED ORDER — CLOPIDOGREL BISULFATE 75 MG PO TABS
75.0000 mg | ORAL_TABLET | Freq: Every day | ORAL | Status: DC
  Administered 2012-03-21 – 2012-03-23 (×3): 75 mg via ORAL
  Filled 2012-03-21 (×6): qty 1

## 2012-03-21 MED ORDER — SODIUM CHLORIDE 0.9 % IV SOLN
1000.0000 mg | INTRAVENOUS | Status: AC
  Administered 2012-03-21: 1000 mg via INTRAVENOUS
  Filled 2012-03-21: qty 10

## 2012-03-21 NOTE — Progress Notes (Signed)
UR COMPLETE. 

## 2012-03-21 NOTE — Progress Notes (Signed)
Rehab Admissions Coordinator Note:  Patient was screened by Trish Mage for appropriateness for an Inpatient Acute Rehab Consult.  At this time, I am deferring the prescreen because an inpatient rehab consult has already been ordered and is pending.  Trish Mage 03/21/2012, 8:56 AM  I can be reached at [phone].

## 2012-03-21 NOTE — Progress Notes (Signed)
Received patient from 3100 via bed.  Pt alert, cooperative.  IV infusing.  Oriented to room, call system.  No compliants at this time.

## 2012-03-21 NOTE — Evaluation (Signed)
Occupational Therapy Evaluation Patient Details Name: Shaun Pugh MRN: [id number] DOB: 07/23/1950 Today's Date: 03/21/2012 Time: 4098-1191 OT Time Calculation (min): 44 min  OT Assessment / Plan / Recommendation Clinical Impression  Pt admitted with left sided weakness, numbness and slurred speech, tPa administered. CT scan found large patch Rt MCA branch infarcts with mild cytotoxic edema but without significant midline shift, no MRI completed yet secondary to respiratory status. Pt presents with Lt hemiplegia, left field cut and left inattention. Pt will benefit from skilled OT in the acute setting followed by CIR to maximize I with ADL and ADL mobility prior to d/c.     OT Assessment  Patient needs continued OT Services    Follow Up Recommendations  CIR    Barriers to Discharge      Equipment Recommendations   (TBD)    Recommendations for Other Services Rehab consult  Frequency  Min 3X/week    Precautions / Restrictions Precautions Precautions: Fall Precaution Comments: Left sided weakness and lack of attention Restrictions Weight Bearing Restrictions: No   Pertinent Vitals/Pain Pt denies any pain    ADL  Grooming: Minimal assistance;Wash/dry face Where Assessed - Grooming: Supported sitting Lower Body Dressing: +2 Total assistance Lower Body Dressing: Patient Percentage: 10% Where Assessed - Lower Body Dressing: Supported sit to Pharmacist, hospital:  (+3total ) Toilet Transfer: Patient Percentage: 30% Statistician Method: Surveyor, minerals:  (bed to chair) Equipment Used: Gait belt Transfers/Ambulation Related to ADLs: +3 total A(pt=30%) for stand pivot from EOB to chair; pt requires step-by-step VC's as well as assist for all aspects of transfer. Left knee blocked ADL Comments: Pt easily distracted and attempts to joke around often    OT Diagnosis: Generalized weakness;Disturbance of vision;Cognitive deficits;Hemiplegia non-dominant  side;Paresis  OT Problem List: Decreased strength;Impaired balance (sitting and/or standing);Decreased activity tolerance;Impaired vision/perception;Decreased coordination;Decreased cognition;Decreased safety awareness;Decreased knowledge of use of DME or AE;Decreased knowledge of precautions;Cardiopulmonary status limiting activity;Impaired sensation;Impaired tone;Impaired UE functional use;Increased edema OT Treatment Interventions: Self-care/ADL training;DME and/or AE instruction;Neuromuscular education;Therapeutic activities;Cognitive remediation/compensation;Visual/perceptual remediation/compensation;Patient/family education;Balance training   OT Goals Acute Rehab OT Goals OT Goal Formulation: With patient/family Time For Goal Achievement: 04/04/12 Potential to Achieve Goals: Good ADL Goals Pt Will Perform Grooming: with min assist;Supported;Sitting at sink ADL Goal: Grooming - Progress: Goal set today Pt Will Perform Upper Body Dressing: Sitting, chair;Sitting, bed;with set-up;with supervision;Supported ADL Goal: Location manager Dressing - Progress: Goal set today Pt Will Perform Lower Body Dressing: with max assist;Sit to stand from bed;Sit to stand from chair ADL Goal: Lower Body Dressing - Progress: Goal set today Pt Will Transfer to Toilet: with 2+ total assist;Ambulation;Stand pivot transfer;with DME (pt=70%) ADL Goal: Toilet Transfer - Progress: Goal set today Pt Will Perform Toileting - Clothing Manipulation: with mod assist;Standing ADL Goal: Toileting - Clothing Manipulation - Progress: Goal set today Additional ADL Goal #1: Pt will perform dynamic sitting balance activities for greater than with Min A  in prep for seated ADLs ADL Goal: Additional Goal #1 - Progress: Goal set today Additional ADL Goal #2: Pt will locate 10/10 objects throughout the room using scanning techniques and Min VC as a part of or in prep for functional tasks. ADL Goal: Additional Goal #2 - Progress:  Goal set today Arm Goals Pt Will Tolerate PROM: with caregiver independent in performing;to maintain range of motion;Left upper extremity;3 sets;10 reps Arm Goal: PROM - Progress: Goal set today  Visit Information  Last OT Received On: 03/21/12 Assistance Needed: +2  PT/OT Co-Evaluation/Treatment: Yes    Subjective Data      Prior Functioning     Home Living Lives With: Spouse Available Help at Discharge: Available 24 hours/day Type of Home: Other (Comment) Home Access: Stairs to enter Entrance Stairs-Number of Steps: 4 Home Layout: One level Bathroom Shower/Tub: Walk-in shower;Door [employer] Toilet: Pharmacist, community: No Home Adaptive Equipment: None Prior Function Level of Independence: Independent Able to Take Stairs?: Yes Driving: Yes Vocation: Retired Comments: part time at [employer]: Expressive difficulties (mumbles/some dysarthria?) Dominant Hand: Right         Vision/Perception Vision - Assessment Eye Alignment:  (unable to test due to cognition) Additional Comments: Difficult to assess as pt unable to attend to tasks. However, does exhibit Left field cut midline and temporally. Do not believe pt has homonymous hemianopsia, but am not able to completely rule this out. Left beating nystagmus noted upon sidelying to sit, disappeared after sitting- will continue to assess.    Cognition  Overall Cognitive Status: Impaired Area of Impairment: Attention;Following commands;Safety/judgement;Awareness of errors;Awareness of deficits;Problem solving;Executive functioning;Memory Arousal/Alertness: Awake/alert Orientation Level: Appears intact for tasks assessed;Oriented X4 / Intact Behavior During Session:  (easily distracted ) Current Attention Level: Focused Attention - Other Comments: req vc to maintain attention on task Memory Deficits: short term memory deficits; pt repeats questions and requires vc to recall  previously given answer Following Commands: Follows one step commands inconsistently Safety/Judgement: Decreased safety judgement for tasks assessed Safety/Judgement - Other Comments: pt attempting to stand and transfer to chair I'ly Awareness of Errors: Assistance required to identify errors made;Assistance required to correct errors made Awareness of Deficits: Recognizes his left sided weakness when asked, however does not acknowledge deficits when preparing to move or those related to vision    Extremity/Trunk Assessment Right Upper Extremity Assessment RUE ROM/Strength/Tone: [facility] for tasks assessed RUE Sensation: WFL - Light Touch RUE Coordination: WFL - gross/fine motor Left Upper Extremity Assessment LUE ROM/Strength/Tone: Deficits LUE ROM/Strength/Tone Deficits: flaccid RUE LUE Sensation: Deficits LUE Sensation Deficits: diminished sensation shoulder and distally- pt reports numbness but is able to localize touch     Mobility Bed Mobility Bed Mobility: Rolling Left;Left Sidelying to Sit;Sitting - Scoot to [place] of Bed Rolling Left: 1: +2 Total assist Rolling Left: Patient Percentage: 30% Left Sidelying to Sit: 1: +2 Total assist Left Sidelying to Sit: Patient Percentage: 30% Sitting - Scoot to Edge of Bed: 1: +2 Total assist Sitting - Scoot to Edge of Bed: Patient Percentage: 40% Transfers Sit to Stand: 1: +2 Total assist Sit to Stand: Patient Percentage: 30% Stand to Sit: 1: +2 Total assist Stand to Sit: Patient Percentage: 30% Details for Transfer Assistance: vc to move right leg and shift weight to right.  Pt able to support partial weight through right side, however required assist for hip placement and transfer.      Shoulder Instructions     Exercise     Balance Balance Balance Assessed: Yes Static Sitting Balance Static Sitting - Comment/# of Minutes: Pt sitting EOB during OT exam of visual field; required tc and vc to maintain upright posture as pt wanted to fall  to left side and backwards.    End of Session OT - End of Session Equipment Utilized During Treatment: Gait belt Activity Tolerance: Patient tolerated treatment well Patient left: in chair;with call bell/phone within reach;with family/visitor present Nurse Communication: Mobility status;Need for lift equipment [facility])  GO     Kayleana Waites 03/21/2012, 10:45 AM

## 2012-03-21 NOTE — Consult Note (Signed)
Physical Medicine and Rehabilitation Consult Reason for Consult: Left sided weakness with numbness, slurred speech,  Left visual field deficits Referring Physician:  Dr. Pearlean Brownie.   HPI: Shaun Pugh is a 62 y.o. male with history of CAD, TIAs with left visual field deficits as well as dizziness/HA. Admitted on 03/17/12 with HA, left sided weakness and numbness with slurred speech. CT head negative and treated with tPA. CTA head/neck with hypodensity right superior gyrus, ulcerated plaque right ICA with 60% ICA stenosis, suspicion for 10mm focal thrombus proximal aortic arch. Patient developed combativeness with agitation as well as focal motor seizures LUE on 03/18/12. He was treated with ativan and started on Keppra. F/u CCT with extensive area of low density affecting cortical and subcortical right hemisphere in MCA distribution. 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 Placed on ASA and no anticoagulation due to large stroke and risk of bleeding. EEG without epileptiform activity.  He did develop fevers and was started on antibiotics for LLL infiltrate.  Carotid dopplers done revealing 60-79% ICA stenosis. 2D echo with EF 65-70% with moderate LVH. Mentation is improving with improvement in left inattention and improved ability to follow one step commands. He continues with severe deficits in safety awareness, basic problem solving, sequencing and attention.  D3 diet with thin liquids initiated yesterday. Therapy team working on pre gait activity and recommends CIR for progression.   Used Hoyer lift today to get in and out of bed Review of Systems  Eyes: Negative for pain.  Respiratory: Negative for shortness of breath.   Cardiovascular: Negative for chest pain and palpitations.  Gastrointestinal: Negative for heartburn, nausea and vomiting.  Musculoskeletal: Positive for back pain.  Neurological: Positive for sensory change, speech change,  focal weakness and weakness. Negative for headaches.   Past Medical History  Diagnosis Date  . High cholesterol   . Atrophy of right kidney 09/15/2011  . Kidney calculi 09/15/2011  . Acute pancreatitis 09/14/2011  . Cholelithiasis 09/14/2011  . Chronic back pain   . Sleep apnea     STOP BANG 5  . TIA (transient ischemic attack)   . CAD (coronary artery disease)   . Stented coronary artery approx 2004   Past Surgical History  Procedure Date  . Back surgery     x 4   . Cholecystectomy 09/25/2011    Procedure: LAPAROSCOPIC CHOLECYSTECTOMY;  Surgeon: Fabio Bering, MD;  Location: [facility];  Service: General;  Laterality: N/A;   Family History  Problem Relation Age of Onset  . Anesthesia problems Neg Hx   . Hypotension Neg Hx   . Malignant hyperthermia Neg Hx   . Pseudochol deficiency Neg Hx   . Congestive Heart Failure Mother   . Diabetes Father   . Heart attack Father    Social History: Married. 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 Disabled since 1995 due to multiple back surgeries. He reports that he has been smoking 1 PPD.  He does not have any smokeless tobacco history on file. He reports that he does not drink alcohol or use illicit drugs. Wife works part time. [place]   Allergies: No Known Allergies  Medications Prior to Admission  Medication Sig Dispense Refill  . rosuvastatin (CRESTOR) 10 MG tablet Take 10 mg by mouth daily.      . valsartan-hydrochlorothiazide (DIOVAN-HCT) 160-12.5 MG per tablet Take 1 tablet by mouth daily.      . [  DISCONTINUED] aspirin EC 325 MG tablet Take 325 mg by mouth daily.      . [DISCONTINUED] meloxicam (MOBIC) 15 MG tablet Take 15 mg by mouth daily.        Home: Home Living Lives With: Spouse Available Help at Discharge: Available 24 hours/day Type of Home: Other (Comment) Home Access: Stairs to enter Entrance Stairs-Number of Steps: 4 Entrance Stairs-Rails:  (handle on the right  side) Home Layout: One level Bathroom Shower/Tub: Walk-in shower;Door [employer] Toilet: Standard Bathroom Accessibility: No How Accessible:  (unknown) Home Adaptive Equipment: None  Functional History: Prior Function Able to Take Stairs?: Yes Driving: Yes Vocation: Retired Comments: part time at [employer] Functional Status:  Mobility: Bed Mobility Bed Mobility: Rolling Left;Left Sidelying to Sit;Sitting - Scoot to [place] of Bed Rolling Right: 1: +2 Total assist Rolling Right: Patient Percentage: 10% Rolling Left: 1: +2 Total assist Rolling Left: Patient Percentage: 30% Left Sidelying to Sit: 1: +2 Total assist Left Sidelying to Sit: Patient Percentage: 30% Supine to Sit: 1: +2 Total assist Supine to Sit: Patient Percentage: 20% Sitting - Scoot to Edge of Bed: 1: +2 Total assist Sitting - Scoot to Edge of Bed: Patient Percentage: 40% Sit to Supine: 1: +2 Total assist Sit to Supine: Patient Percentage: 10% Transfers Transfers: Sit to Stand;Stand to Sit Sit to Stand: 1: +2 Total assist Sit to Stand: Patient Percentage: 30% Stand to Sit: 1: +2 Total assist Stand to Sit: Patient Percentage: 30% Transfer via Lift Equipment: [place] Ambulation/Gait Ambulation/Gait Assistance: Not tested (comment)    ADL: ADL Grooming: Minimal assistance;Wash/dry face Where Assessed - Grooming: Supported sitting Lower Body Dressing: +2 Total assistance Where Assessed - Lower Body Dressing: Supported sit to Pharmacist, hospital:  (+3total ) Toilet Transfer Method: Surveyor, minerals:  (bed to chair) Equipment Used: Gait belt Transfers/Ambulation Related to ADLs: +3 total A(pt=30%) for stand pivot from EOB to chair; pt requires step-by-step VC's as well as assist for all aspects of transfer. Left knee blocked ADL Comments: Pt easily distracted and attempts to joke around often  Cognition: Cognition Overall Cognitive Status: Impaired Arousal/Alertness:  Awake/alert Orientation Level: Oriented X4 Attention: Focused Focused Attention: Impaired Focused Attention Impairment: Verbal basic;Functional basic Memory: Impaired Memory Impairment: Decreased short term memory;Retrieval deficit Decreased Short Term Memory: Verbal basic;Functional basic Awareness: Impaired Awareness Impairment: Intellectual impairment;Emergent impairment;Anticipatory impairment Problem Solving: Impaired Problem Solving Impairment: Verbal basic;Functional basic Executive Function: Reasoning;Self Monitoring;Initiating Reasoning: Impaired Reasoning Impairment: Verbal basic;Functional basic Initiating: Impaired Self Monitoring: Impaired Self Monitoring Impairment: Verbal basic;Functional basic Behaviors: Restless;Impulsive;Physical agitation;Poor frustration tolerance Safety/Judgment: Impaired Cognition Overall Cognitive Status: Impaired Area of Impairment: Attention;Following commands;Safety/judgement;Awareness of errors;Awareness of deficits;Problem solving;Executive functioning;Memory Arousal/Alertness: Awake/alert Orientation Level: Appears intact for tasks assessed;Oriented X4 / Intact Behavior During Session:  (easily distracted ) Current Attention Level: Focused Attention - Other Comments: req vc to maintain attention on task Memory Deficits: short term memory deficits; pt repeats questions and requires vc to recall previously given answer Following Commands: Follows one step commands inconsistently Safety/Judgement: Decreased safety judgement for tasks assessed Safety/Judgement - Other Comments: pt attempting to stand and transfer to chair I'ly Awareness of Errors: Assistance required to identify errors made;Assistance required to correct errors made Awareness of Deficits: Recognizes his left sided weakness when asked, however does not acknowledge deficits when preparing to move or those related to vision Cognition - Other Comments: Increased awareness this  session. Left neglect  Blood pressure 111/57, pulse 83, temperature 98.6 F (37 C), temperature source Axillary, resp.  rate 20, height 6\' 1"  (1.854 m), weight 115.7 kg (255 lb 1.2 oz), SpO2 97.00%. Physical Exam  Nursing note and vitals reviewed. Constitutional: He is oriented to person, place, and time. He appears well-developed and well-nourished.  HENT:  Head: Normocephalic and atraumatic.  Eyes: Pupils are equal, round, and reactive to light.  Neck: Normal range of motion.  Cardiovascular: Normal rate and regular rhythm.   Pulmonary/Chest: Effort normal and breath sounds normal.  Abdominal: Soft. Bowel sounds are normal.  Neurological: He is alert and oriented to person, place, and time.       Left facial weakness.  Mild left inattention. Left hemiparesis with sensory deficits. Reports minimal vision in LUQ of left eye. Follows two step commands without difficulty.   Has decreased sensation bilateral feet Motor strength is 0/5 in the left upper and left lower extremity Speech with dysarthria, no evidence of aphasia  No results found for this or any previous visit (from the past 24 hour(s)).   Assessment/Plan: Diagnosis:Embolic Right MCA distribution infarct with left hemiparesis, left neglect, left hemisensory deficits 1. Does the need for close, 24 hr/day medical supervision in concert with the patient's rehab needs make it unreasonable for this patient to be served in a less intensive setting? Yes 2. Co-Morbidities requiring supervision/potential complications: Pneumonia,Aortic arch thrombus 3. Due to bladder management, bowel management, safety, skin/wound care, disease management, medication administration, pain management and patient education, does the patient require 24 hr/day rehab nursing? Yes 4. Does the patient require coordinated care of a physician, rehab nurse, PT (1-2 hrs/day, 5 days/week), OT (1-2 hrs/day, 5 days/week) and SLP (0.5-1 hrs/day, 5 days/week) to address  physical and functional deficits in the context of the above medical diagnosis(es)? Yes Addressing deficits in the following areas: balance, endurance, locomotion, strength, transferring, bowel/bladder control, bathing, dressing, grooming, toileting, speech and swallowing 5. Can the patient actively participate in an intensive therapy program of at least 3 hrs of therapy per day at least 5 days per week? No 6. The potential for patient to make measurable gains while on inpatient rehab is good 7. Anticipated functional outcomes upon discharge from inpatient rehab are Min to mod assist mobility with PT, Min to mod assist ADLs with OT, Safe by mouth intake, adequate cognition for home environment with SLP. 8. Estimated rehab length of stay to reach the above functional goals is: 3-4 weeks 9. Does the patient have adequate social supports to accommodate these discharge functional goals? Potentially 10. Anticipated D/C setting: Will need to clarify home environment, listed as a camper 11. Anticipated post D/C treatments: HH therapy 12. Overall Rehab/Functional Prognosis: good  RECOMMENDATIONS: This patient's condition is appropriate for continued rehabilitative care in the following setting: Should be ready for CIR in 2-3 days, currently not able to tolerate three-hour per day program Patient has agreed to participate in recommended program. Yes Note that insurance prior authorization may be required for reimbursement for recommended care.  Comment:    03/21/2012

## 2012-03-21 NOTE — Progress Notes (Signed)
Stroke Team Progress Note  HISTORY Shaun Pugh is an 61 y.o. male who has a complicated history over the last 6 months. Back in July patient was having visual issues with his left visual field. He had seen a opthalmologist who diagnosed him with a retinal artery occlusion. He was placed on ASA at that time. Since that date he has been having issues with dizziness and headaches off and on. He recently was seen by opthalmologist who stated he visualized multiple small embolisms in right retina and then 2 weeks ago was seen by neurologist at [facility] who was concerned for possible strokes/TIA's due to his symptoms of dizziness and HA. These symptoms were not persistent. No specific diagnosis of CVA was made at that time. He was scheduled to have a MRI of brain (he is claustrophobia) as out patient next Monday. Today 03/17/2012 he has complained of a HA at 9 AM but this cleared over time. At 11AM He again complained of HA, left sided weakness and numbness along with slurred speech. EMS was called and patient was brought to ED as Code stroke. In depth discussion was made with wife about all the risks and benefits. Decision was made to administer tPA. He was admitted to the neuro ICU for further evaluation and treatment.patient saw opthalmologist this week who noticed new retinal emboli on eye exam as per his wife.  SUBJECTIVE His wife and son are at the bedside.  Overall he feels his condition is gradually improving.No seizures x 3 days. CXR shows left lower lobe infiltrate. Patient has no complaints but wants to go home.  OBJECTIVE Most recent Vital Signs: Filed Vitals:   03/21/12 0000 03/21/12 0200 03/21/12 0400 03/21/12 0600  BP: 122/65 104/51 126/69 109/44  Pulse: 78 74 78 82  Temp: 98 F (36.7 C)  98.2 F (36.8 C)   TempSrc: Oral     Resp: 16 16 14 19   Height:      Weight:      SpO2: 96% 96% 98% 96%   IV Fluid Intake:      . sodium chloride 1,000 mL (03/21/12 0256)     MEDICATIONS     . aspirin  300 mg Rectal Daily  . enoxaparin (LOVENOX) injection  40 mg Subcutaneous Q24H  . levetiracetam  1,000 mg Intravenous Q12H  . pantoprazole (PROTONIX) IV  40 mg Intravenous QHS  . piperacillin-tazobactam (ZOSYN)  IV  3.375 g Intravenous Q8H   PRN:  acetaminophen, acetaminophen, albuterol, HYDROmorphone (DILAUDID) injection, ipratropium, labetalol, ondansetron (ZOFRAN) IV, senna-docusate, traMADol  Diet:  DYS 3  Activity:  As tolerated DVT Prophylaxis:  SCDs   CLINICALLY SIGNIFICANT STUDIES Basic Metabolic Panel:   Lab 03/20/12 0535 03/17/12 1353 03/17/12 1315  NA 139 140 --  K 3.6 3.9 --  CL 107 106 --  CO2 23 -- 22  GLUCOSE 99 106* --  BUN 28* 19 --  CREATININE 0.95 1.20 --  CALCIUM 8.3* -- 9.7  MG -- -- --  PHOS -- -- --   Liver Function Tests:   Lab 03/17/12 1315  AST 15  ALT 19  ALKPHOS 76  BILITOT 0.4  PROT 8.0  ALBUMIN 3.9   CBC:   Lab 03/20/12 0535 03/17/12 1353 03/17/12 1315  WBC 16.1* -- 17.3*  NEUTROABS 11.1* -- 9.3*  HGB 13.3 15.6 --  HCT 38.4* 46.0 --  MCV 89.5 -- 88.2  PLT 139* -- 207   Coagulation:   Lab 03/17/12 1315  LABPROT 13.2  INR 1.01   Cardiac Enzymes:   Lab 03/17/12 1315  CKTOTAL 34  CKMB 1.6  CKMBINDEX --  TROPONINI <0.30   Urinalysis:   Lab 03/18/12 1539  COLORURINE YELLOW  LABSPEC 1.024  PHURINE 6.0  GLUCOSEU NEGATIVE  HGBUR NEGATIVE  BILIRUBINUR NEGATIVE  KETONESUR NEGATIVE  PROTEINUR NEGATIVE  UROBILINOGEN 1.0  NITRITE NEGATIVE  LEUKOCYTESUR NEGATIVE   Lipid Panel    Component Value Date/Time   CHOL 179 03/18/2012 0420   TRIG 163* 03/18/2012 0420   HDL 32* 03/18/2012 0420   CHOLHDL 5.6 03/18/2012 0420   VLDL 33 03/18/2012 0420   LDLCALC 114* 03/18/2012 0420   HgbA1C  Lab Results  Component Value Date   HGBA1C 5.9* 03/18/2012    Urine Drug Screen:   No results found for this basename: labopia,  cocainscrnur,  labbenz,  amphetmu,  thcu,  labbarb    Alcohol Level: No  results found for this basename: ETH:2 in the last 168 hours  CT of the brain   03/17/2012 No acute intracranial abnormality seen. 03/17/2012  Paranasal sinus mucosal thickening and fluid in the left mastoid air cells.  Otherwise no acute intracranial pathology.    11/10/2013No significant change in the appearance of large right middle cerebral artery distribution infarct.  Ct Angio Head 03/17/2012   1.  Decreased enhancement of the right MCA M2 branches of the anterior sylvian division.  No major branch occlusion, no intracranial hemodynamically significant stenosis identified. Otherwise minimal to mild intracranial atherosclerosis. 2.  Subtle cortical hypodensity in the anterior right superior frontal gyrus suspicious for ischemia in this setting.  No mass effect or hemorrhage. 3.  Age indeterminate but favor chronic lacunar infarct in the right thalamus.      Ct Angio Neck 03/17/2012   1.  Ulcerated plaque at the right ICA origin resulting in 60% right ICA stenosis. 2.  Findings suspicious for 10 mm of focal thrombus in the proximal aortic arch (see axial image 6). This was discussed with Dr. Thana Farr at 1609 hours on 03/17/2012. 3.  Mild left carotid atherosclerosis.  Dominant right vertebral artery. 4.  See intracranial findings below. 5.  Small 9 mm nodule in the right parotid gland is indeterminate.    MRI of the brain  claustophobic  MRA of the brain  See CTA brain  2D Echocardiogram  Left ventricle: The cavity size was normal. Wall thicknesswas increased in a pattern of moderate LVH. Systolicfunction was vigorous. The estimated ejection fraction wasin the range of 65% to 70%. Regional wall motioabnormalities cannot be excluded. Left ventriculardiastolic function parameters were normal.- Left atrium: The atrium was mildly dilated. Impressions:- No cardiac source of emboli was indentified  Transcranial Doppler 03/20/12 shows elevated Rt MCA velocities s/o moderate stenosis.  CXR    03/17/2012  Slight atelectasis at the left lung base. 03/20/2012 LLL infiltrate (abx) EKG  normal sinus rhythm, RBBB.   EEG    Rt hemispheric slowing with focal sharp waves at F  but no definite epileptiform activity.  Carotid dopplers pending  Therapy Recommendations PT - ; OT - ; ST - CIR  Physical Exam    .Awake alert. Afebrile. Head is nontraumatic. Neck is supple without bruit. Hearing is normal. Cardiac exam no murmur or gallop. Lungs are clear to auscultation. Distal pulses are well felt.  Neurological Exam : Alert and able to open eyes  Easily and follows few simple commands. Mild right gaze preference but can look barely to left past midline. Decrease blink  to threat on left. Pupils 4 mm equal and reactive. Dysarthric speech. Tongue midline. Dense left hemiplegia with flaccidity.mild left hemineglect and sensory loss present. Minimal withdrawal to pain on left side.purposeful antigravity right sided movements present.   Plantars left upgoing and right is downgoing.  ASSESSMENT [name] is a 62 y.o. male presenting with HA, left sided weakness and numbness along with slurred speech. 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 Initial imaging confirms large patchy right MCA infarct. Infarct felt to be embolic secondary to aortic arch thrombus.CT shows 2 separate left MCA pareital and frontal branch infarcts suspect of different ages..   On aspirin 325 mg orally every day prior to admission. Now on Aspirin 300 mg rectally   Patient with resultant agitation, lethargy, left hemiparesis, seizures, dysarthria, visual defect.  retinal artery occlusion July 2013 Concern with right retina embolisms last week Hyperlipidemia, LDL 114, on statin PTA now, goal LDL < 100 CAD, ?MI, definite stent placement approx 2004, on plavix  following OSA New onset seizure 03/18/2012, partial, involving the arms and face No known history of atrial fibrillation Thrombocytopenia, 132 will follow Left lung infiltrate consistent with pneumonia, on zosyn (hospital acquired)  Hospital day # 4  TREATMENT/PLAN  Continue aspirin 300 mg suppository every day for secondary stroke prevention.Will not anticoagulate due to large size of stroke and risk of intracranial bleeding. Add plavix.  DYS 3 diet  Carotid dopplers to image vascular patency as infarcts suggest reocclusion after TPA  CIR recommended, consulted   Patients disease state was discussed with patient, wife and son.     Guy Franco, [facility],  MBA, MHA [facility] Pager: [phone] 03/21/2012 7:40 AM  Scribe for Dr. Delia Heady, Stroke Center Medical Director. He has personally reviewed chart, pertinent data, examined the patient and developed the plan of care. Pager:  [phone]

## 2012-03-21 NOTE — Progress Notes (Signed)
Physical Therapy Treatment Patient Details Name: Shaun Pugh MRN: [id number] DOB: December 02, 1950 Today's Date: 03/21/2012 Time: 4098-1191 PT Time Calculation (min): 44 min  PT Assessment / Plan / Recommendation Comments on Treatment Session  Pt s/p R CVA with Left sided weakness and innattention.  Pt with increased awareness of left side as he turned his head in recognition of visitors to his left.  Pt moved impulsively, requiring strict one step commands and 2+ total assist to achieve bed to chair transfer.     Follow Up Recommendations  CIR;Supervision/Assistance - 24 hour     Does the patient have the potential to tolerate intense rehabilitation     Barriers to Discharge        Equipment Recommendations       Recommendations for Other Services    Frequency Min 4X/week   Plan Discharge plan remains appropriate;Frequency remains appropriate    Precautions / Restrictions Precautions Precautions: Fall Precaution Comments: Left sided weakness and lack of attention Restrictions Weight Bearing Restrictions: No   Pertinent Vitals/Pain no apparent distress     Mobility  Bed Mobility Bed Mobility: Rolling Left;Left Sidelying to Sit;Sitting - Scoot to [place] of Bed Rolling Left: 1: +2 Total assist Rolling Left: Patient Percentage: 30% Left Sidelying to Sit: 1: +2 Total assist Left Sidelying to Sit: Patient Percentage: 30% Sitting - Scoot to Edge of Bed: 1: +2 Total assist Sitting - Scoot to Edge of Bed: Patient Percentage: 40% Transfers Transfers: Sit to Stand;Stand to Sit Sit to Stand: 1: +2 Total assist Sit to Stand: Patient Percentage: 30% Stand to Sit: 1: +2 Total assist Stand to Sit: Patient Percentage: 30% Details for Transfer Assistance: vc to move right leg and shift weight to right.  Pt able to support partial weight through right side, however required assist for hip placement and transfer.  Ambulation/Gait Ambulation/Gait Assistance: Not tested (comment)         PT Goals Acute Rehab PT Goals PT Goal: Supine/Side to Sit - Progress: Progressing toward goal PT Goal: Sit at Edge Of Bed - Progress: Progressing toward goal PT Goal: Sit to Stand - Progress: Progressing toward goal PT Goal: Stand to Sit - Progress: Progressing toward goal Pt will Transfer Bed to Chair/Chair to Bed: with max assist PT Transfer Goal: Bed to Chair/Chair to Bed - Progress: Updated due to goal met  Visit Information  Last PT Received On: 03/21/12 Assistance Needed: +2    Subjective Data  Subjective: Pt states he would like to go to the chair when ask, however once in the chair he explains he wouod like to go back to bed.  Patient Stated Goal: Pt wants to hurry up and go home   Cognition  Overall Cognitive Status: Impaired Area of Impairment: Attention;Following commands;Safety/judgement;Awareness of errors;Awareness of deficits;Problem solving;Executive functioning;Memory Arousal/Alertness: Awake/alert Orientation Level: Appears intact for tasks assessed;Oriented X4 / Intact (vc to facilitate orientation to time) Behavior During Session: [facility] for tasks performed Current Attention Level: Focused Attention - Other Comments: req vc to maintain attention on task Memory Deficits: short term memory deficits; pt repeats questions and requires vc to recall previously given answer Following Commands: Follows one step commands consistently Safety/Judgement: Decreased safety judgement for tasks assessed Awareness of Errors: Assistance required to identify errors made;Assistance required to correct errors made Awareness of Deficits: Recognizes his left sided weakness when asked, however does not acknowledge deficits when preparing to move    Balance  Balance Balance Assessed: Yes Static Sitting Balance Static Sitting -  Comment/# of Minutes: Pt sitting EOB during OT exam of visual field; required tc and vc to maintain upright posture as pt wanted to fall to left side and  backwards.   End of Session PT - End of Session Equipment Utilized During Treatment: Gait belt Activity Tolerance: Patient tolerated treatment well Patient left: in chair;with call bell/phone within reach;with family/visitor present Nurse Communication: Mobility status;Need for lift equipment       Sharion Balloon 03/21/2012, 10:14 AM  Sharion Balloon, SPT Acute Rehab Services [phone]

## 2012-03-21 NOTE — Progress Notes (Signed)
VASCULAR LAB PRELIMINARY  PRELIMINARY  PRELIMINARY  PRELIMINARY  Carotid duplex  completed.    Preliminary report:  Right:  60-79% internal carotid artery stenosis.  Left:  No evidence of hemodynamically significant internal carotid artery stenosis.  Bilateral:  Vertebral artery flow is antegrade.    Fernado Brigante, RVT 03/21/2012, 12:01 PM

## 2012-03-21 NOTE — Progress Notes (Signed)
Agree with PT treatment session.  Kennan, Morton DPT [phone]

## 2012-03-22 DIAGNOSIS — G8110 Spastic hemiplegia affecting unspecified side: Secondary | ICD-10-CM

## 2012-03-22 DIAGNOSIS — I6340 Cerebral infarction due to embolism of unspecified cerebral artery: Secondary | ICD-10-CM

## 2012-03-22 LAB — CBC
HCT: 36.1 % — ABNORMAL LOW (ref 39.0–52.0)
MCHC: 35.2 g/dL (ref 30.0–36.0)
MCV: 87.6 fL (ref 78.0–100.0)
RDW: 13.6 % (ref 11.5–15.5)

## 2012-03-22 MED ORDER — QUETIAPINE 12.5 MG HALF TABLET
12.5000 mg | ORAL_TABLET | Freq: Every day | ORAL | Status: DC
  Administered 2012-03-22: 12.5 mg via ORAL
  Filled 2012-03-22 (×2): qty 1

## 2012-03-22 MED ORDER — PANTOPRAZOLE SODIUM 40 MG PO TBEC
40.0000 mg | DELAYED_RELEASE_TABLET | Freq: Every day | ORAL | Status: DC
  Administered 2012-03-22 – 2012-03-23 (×2): 40 mg via ORAL
  Filled 2012-03-22 (×2): qty 1

## 2012-03-22 NOTE — Progress Notes (Signed)
Speech Language Pathology Dysphagia/cognitive Treatment Patient Details Name: Shaun Pugh MRN: [id number] DOB: 1950/11/10 Today's Date: 03/22/2012 Time: 4098-1191 SLP Time Calculation (min): 24 min  Assessment / Plan / Recommendation Clinical Impression  F/u for swallowing and cognition.  Pt with difficulty maintaining alertness; slept poorly per wife.  Refused breakfast; consumed thin liquids in clinician's presence with min cues needed to maintain labial seal, monitor bolus size, remain in upright position (pushing to left). Attended to stimuli on left with mod verbal cues.  Orientation 70% accuracy (not oriented to place and certain elements of time.)  Mod overall assist to sustain attention in order to follow commands related to PO consumption and positioning.      Diet Recommendation  Continue with Current Diet: Dysphagia 3 (mechanical soft);Thin liquid    SLP Plan Continue with current plan of care   Pertinent Vitals/Pain    Swallowing Goals  SLP Swallowing Goals Patient will consume recommended diet without observed clinical signs of aspiration with: Moderate assistance  General Temperature Spikes Noted: No Respiratory Status: Room air Behavior/Cognition: Alert;Cooperative;Pleasant mood Oral Cavity - Dentition: Dentures, top;Dentures, bottom Patient Positioning: Partially reclined  Oral Cavity - Oral Hygiene Does patient have any of the following "at risk" factors?: None of the above   Dysphagia Treatment Treatment focused on: Skilled observation of diet tolerance;Patient/family/caregiver education Treatment Methods/Modalities: Skilled observation Patient observed directly with PO's: Yes Type of PO's observed: Thin liquids Feeding: Able to feed self Liquids provided via: Cup Oral Phase Signs & Symptoms: Anterior loss/spillage Pharyngeal Phase Signs & Symptoms: Suspected delayed swallow initiation Type of cueing: Verbal Amount of cueing: Minimal   GO   Marquitta Persichetti L.  Samson Frederic, [place] CCC/SLP Pager [phone]   Blenda Mounts Laurice 03/22/2012, 10:01 AM

## 2012-03-22 NOTE — Progress Notes (Signed)
Physical Therapy Treatment Patient Details Name: Shaun Pugh MRN: [id number] DOB: [birth year] Today's Date: 03/22/2012 Time: 4098-1191 PT Time Calculation (min): 49 min  PT Assessment / Plan / Recommendation Comments on Treatment Session  Pt very pleasant to work with.  Cont's with significant Lt side neglect, requiring max cues for increased awareness of Lt side.      Follow Up Recommendations  CIR;Supervision/Assistance - 24 hour     Does the patient have the potential to tolerate intense rehabilitation     Barriers to Discharge        Equipment Recommendations  Other (comment) (TBD)    Recommendations for Other Services Rehab consult  Frequency Min 4X/week   Plan Discharge plan remains appropriate    Precautions / Restrictions Precautions Precautions: Fall Restrictions Weight Bearing Restrictions: No       Mobility  Bed Mobility Bed Mobility: Sitting - Scoot to Edge of Bed;Rolling Right;Right Sidelying to Sit Rolling Right: 1: +2 Total assist Rolling Right: Patient Percentage: 10% Right Sidelying to Sit: 1: +2 Total assist;HOB elevated;With rails Right Sidelying to Sit: Patient Percentage: 10% Sitting - Scoot to Edge of Bed: 1: +2 Total assist Sitting - Scoot to Edge of Bed: Patient Percentage: 40% Details for Bed Mobility Assistance: Max directional cues for sequencing & technique.  (A) for all components of transitioning with use of draw pad to (A) with hips Transfers Transfers: Sit to Stand;Stand to Sit;Stand Pivot Transfers Sit to Stand: 1: +2 Total assist;From elevated surface;From bed Sit to Stand: Patient Percentage: 40% Stand to Sit: 1: +2 Total assist;To chair/3-in-1 Stand to Sit: Patient Percentage: 30% Stand Pivot Transfers: 1: +2 Total assist Stand Pivot Transfers: Patient Percentage: 40% Details for Transfer Assistance: Use of draw pad to craddle hips to lift off bed, facilitate hip extension, rotation of pelvis from bed>recliner.    Ambulation/Gait Ambulation/Gait Assistance: Not tested (comment) Modified Rankin (Stroke Patients Only) Pre-Morbid Rankin Score: No symptoms Modified Rankin: Severe disability      PT Goals Acute Rehab PT Goals Time For Goal Achievement: 04/02/12 Potential to Achieve Goals: Fair Pt will go Supine/Side to Sit: with mod assist PT Goal: Supine/Side to Sit - Progress: Not met Pt will Sit at [facility] of Bed: 3-5 min;with bilateral upper extremity support;with min assist PT Goal: Sit at Edge Of Bed - Progress: Progressing toward goal Pt will go Sit to Supine/Side: with mod assist Pt will go Sit to Stand: with max assist PT Goal: Sit to Stand - Progress: Progressing toward goal Pt will go Stand to Sit: with max assist PT Goal: Stand to Sit - Progress: Progressing toward goal Pt will Transfer Bed to Chair/Chair to Bed: with max assist PT Transfer Goal: Bed to Chair/Chair to Bed - Progress: Progressing toward goal  Visit Information  Last PT Received On: 03/22/12 Assistance Needed: +2    Subjective Data  Subjective: "my hand is over there in the bed"   Patient Stated Goal: Pt wants to hurry up and go home   Cognition  Overall Cognitive Status: Impaired Area of Impairment: Attention;Following commands;Safety/judgement;Awareness of errors;Awareness of deficits;Problem solving;Executive functioning;Memory Arousal/Alertness: Awake/alert Orientation Level: Appears intact for tasks assessed;Oriented X4 / Intact Behavior During Session: [facility] for tasks performed Current Attention Level: Sustained; easily distracted.   Following Commands: Follows one step commands inconsistently Safety/Judgement: Decreased awareness of safety precautions;Decreased awareness of need for assistance Awareness of Errors: Assistance required to identify errors made;Assistance required to correct errors made Awareness of Deficits: Recognizes his left sided weakness  when asked, however does not acknowledge deficits  when preparing to move or those related to vision; Required max cues throughout for Lt side awareness.     Balance  Balance Balance Assessed: Yes Static Sitting Balance Static Sitting - Balance Support: Feet supported;Right upper extremity supported;No upper extremity supported Static Sitting - Level of Assistance: 5: Stand by assistance;4: Min assist;3: Mod assist Static Sitting - Comment/# of Minutes: Pt able to sit EOB x 15 minutes working on reaching activities with RUE, lateral leaning to Rt elbow/forearm & sitting back to midline, & activities to increase awareness of Lt side.  Cues for use of trunk muscles to sit upright & midline posture.  Pt has tendency to push with Rt UE but decreased pushing after weightbearing activity through Rt elbow/forearm.  Pt able to sit with Min Guard for 10 secs 2-3x's.  Poor awareness of Lt side.    End of Session PT - End of Session Equipment Utilized During Treatment: Gait belt Activity Tolerance: Patient tolerated treatment well Patient left: in chair;with call bell/phone within reach Nurse Communication: Mobility status;Need for lift equipment     Verdell Face, [place] [phone] 03/22/2012

## 2012-03-22 NOTE — Progress Notes (Signed)
Dr. Amada Jupiter paged at 2109 regarding pt's confusion and pt stating that he saw "sparks". Pt also states that he is seeing a man in his room that he does not recognize. Pt is hallucinating. MD ordered extra Keppra and UA.  Around 2350, UA came back negative and pt is still hallucinating and insistent on leaving the room after receiving extra Keppra. Pt denies seeing any "sparks" at this time. Dr. Amada Jupiter paged again regarding no change in confusion and family's concern. Will continue to monitor pt.   Salvadore Oxford, RN 03/22/12 [phone]

## 2012-03-22 NOTE — Progress Notes (Signed)
I will follow pt's tolerance with therapy and begin discussion with family inpt rehab venue. Also [employer] approval once tolerance increases per Dr. Wynn Banker' recommendations. [phone]

## 2012-03-22 NOTE — Progress Notes (Signed)
   CARE MANAGEMENT NOTE 03/22/2012  Patient:  HALLIS, MEDITZ   Account Number:  [id number]  Date Initiated:  03/21/2012  Documentation initiated by:  Carlyle Lipa  Subjective/Objective Assessment:   stroke     Action/Plan:   await determination on CIR   Anticipated DC Date:  03/24/2012   Anticipated DC Plan:  IP REHAB FACILITY      DC Planning Services  CM consult      Choice offered to / List presented to:             Status of service:  Completed, signed off Medicare Important Message given?   (If response is "NO", the following Medicare IM given date fields will be blank) Date Medicare IM given:   Date Additional Medicare IM given:    Discharge Disposition:    Per UR Regulation:  Reviewed for med. necessity/level of care/duration of stay  If discussed at Long Length of Stay Meetings, dates discussed:    Comments:  03/22/2012 1000 NCM spoke to wife, Shamir Tuzzolino [phone]. States plan is to go back home after completion of IP rehab. NCM explained if any additional DME or HH needed that will arrange at his d/c. Isidoro Donning RN CCM Case Mgmt phone [phone]

## 2012-03-22 NOTE — Progress Notes (Signed)
Stroke Team Progress Note  HISTORY Shaun Pugh is an 61 y.o. male who has a complicated history over the last 6 months. Back in July patient was having visual issues with his left visual field. He had seen a opthalmologist who diagnosed him with a retinal artery occlusion. He was placed on ASA at that time. Since that date he has been having issues with dizziness and headaches off and on. He recently was seen by opthalmologist who stated he visualized multiple small embolisms in right retina and then 2 weeks ago was seen by neurologist at [facility] who was concerned for possible strokes/TIA's due to his symptoms of dizziness and HA. These symptoms were not persistent. No specific diagnosis of CVA was made at that time. He was scheduled to have a MRI of brain (he is claustrophobia) as out patient next Monday. Today 03/17/2012 he has complained of a HA at 9 AM but this cleared over time. At 11AM He again complained of HA, left sided weakness and numbness along with slurred speech. EMS was called and patient was brought to ED as Code stroke. In depth discussion was made with wife about all the risks and benefits. Decision was made to administer tPA. He was admitted to the neuro ICU for further evaluation and treatment.patient saw opthalmologist this week who noticed new retinal emboli on eye exam as per his wife.  SUBJECTIVE His wife is at the bedside.  Patient had a "rough" night per wife - hallucinating, agitated, did not sleep.  OBJECTIVE Most recent Vital Signs: Filed Vitals:   03/21/12 1950 03/21/12 2123 03/22/12 0140 03/22/12 0604  BP: 154/65 165/73 144/70 162/75  Pulse: 88 91 88 87  Temp: 97.8 F (36.6 C) 98.5 F (36.9 C) 98.5 F (36.9 C) 98 F (36.7 C)  TempSrc: Oral Oral Oral   Resp: 20 18 18 18   Height:      Weight:      SpO2: 99% 98% 96% 97%   IV Fluid Intake:     . sodium chloride 75 mL/hr at 03/21/12 1829   MEDICATIONS    . aspirin EC  325 mg Oral Daily  .  atorvastatin  20 mg Oral q1800  . clopidogrel  75 mg Oral Q breakfast  . enoxaparin (LOVENOX) injection  40 mg Subcutaneous Q24H  . [COMPLETED] levetiracetam  1,000 mg Intravenous STAT  . levetiracetam  1,250 mg Intravenous Q12H  . [COMPLETED] levetiracetam  750 mg Intravenous Once  . pantoprazole (PROTONIX) IV  40 mg Intravenous QHS  . [DISCONTINUED] levetiracetam  1,000 mg Intravenous Q12H  . [DISCONTINUED] piperacillin-tazobactam (ZOSYN)  IV  3.375 g Intravenous Q8H   PRN:  acetaminophen, acetaminophen, albuterol, HYDROmorphone (DILAUDID) injection, ipratropium, labetalol, ondansetron (ZOFRAN) IV, senna-docusate, [DISCONTINUED] traMADol  Diet:  DYS 3 thin liquids Activity:  As tolerated DVT Prophylaxis:  SCDs and Lovenox 40 mg sq daily   CLINICALLY SIGNIFICANT STUDIES Basic Metabolic Panel:   Lab 03/20/12 0535 03/17/12 1353 03/17/12 1315  NA 139 140 --  K 3.6 3.9 --  CL 107 106 --  CO2 23 -- 22  GLUCOSE 99 106* --  BUN 28* 19 --  CREATININE 0.95 1.20 --  CALCIUM 8.3* -- 9.7  MG -- -- --  PHOS -- -- --   Liver Function Tests:   Lab 03/17/12 1315  AST 15  ALT 19  ALKPHOS 76  BILITOT 0.4  PROT 8.0  ALBUMIN 3.9   CBC:   Lab 03/22/12 0700 03/20/12 0535 03/17/12  1315  WBC 10.4 16.1* --  NEUTROABS -- 11.1* 9.3*  HGB 12.7* 13.3 --  HCT 36.1* 38.4* --  MCV 87.6 89.5 --  PLT 165 139* --   Coagulation:   Lab 03/17/12 1315  LABPROT 13.2  INR 1.01   Cardiac Enzymes:   Lab 03/17/12 1315  CKTOTAL 34  CKMB 1.6  CKMBINDEX --  TROPONINI <0.30   Urinalysis:   Lab 03/21/12 2130 03/18/12 1539  COLORURINE YELLOW YELLOW  LABSPEC 1.015 1.024  PHURINE 7.0 6.0  GLUCOSEU NEGATIVE NEGATIVE  HGBUR MODERATE* NEGATIVE  BILIRUBINUR NEGATIVE NEGATIVE  KETONESUR NEGATIVE NEGATIVE  PROTEINUR NEGATIVE NEGATIVE  UROBILINOGEN 4.0* 1.0  NITRITE NEGATIVE NEGATIVE  LEUKOCYTESUR NEGATIVE NEGATIVE   Lipid Panel    Component Value Date/Time   CHOL 179 03/18/2012 0420   TRIG  163* 03/18/2012 0420   HDL 32* 03/18/2012 0420   CHOLHDL 5.6 03/18/2012 0420   VLDL 33 03/18/2012 0420   LDLCALC 114* 03/18/2012 0420   HgbA1C  Lab Results  Component Value Date   HGBA1C 5.9* 03/18/2012    Urine Drug Screen:   No results found for this basename: labopia,  cocainscrnur,  labbenz,  amphetmu,  thcu,  labbarb    Alcohol Level: No results found for this basename: ETH:2 in the last 168 hours  CT of the brain   03/17/2012 No acute intracranial abnormality seen. 03/17/2012  Paranasal sinus mucosal thickening and fluid in the left mastoid air cells.  Otherwise no acute intracranial pathology.    11/10/2013No significant change in the appearance of large right middle cerebral artery distribution infarct.  Ct Angio Head 03/17/2012   1.  Decreased enhancement of the right MCA M2 branches of the anterior sylvian division.  No major branch occlusion, no intracranial hemodynamically significant stenosis identified. Otherwise minimal to mild intracranial atherosclerosis. 2.  Subtle cortical hypodensity in the anterior right superior frontal gyrus suspicious for ischemia in this setting.  No mass effect or hemorrhage. 3.  Age indeterminate but favor chronic lacunar infarct in the right thalamus.      Ct Angio Neck 03/17/2012   1.  Ulcerated plaque at the right ICA origin resulting in 60% right ICA stenosis. 2.  Findings suspicious for 10 mm of focal thrombus in the proximal aortic arch (see axial image 6). This was discussed with Dr. Thana Farr at 1609 hours on 03/17/2012. 3.  Mild left carotid atherosclerosis.  Dominant right vertebral artery. 4.  See intracranial findings below. 5.  Small 9 mm nodule in the right parotid gland is indeterminate.    MRI of the brain  claustophobic  MRA of the brain  See CTA brain  2D Echocardiogram  Left ventricle: The cavity size was normal. Wall thicknesswas increased in a pattern of moderate LVH. Systolicfunction was vigorous. The estimated ejection  fraction wasin the range of 65% to 70%. Regional wall motioabnormalities cannot be excluded. Left ventriculardiastolic function parameters were normal.- Left atrium: The atrium was mildly dilated. Impressions:- No cardiac source of emboli was indentified  Carotid DopplerRight: 60-79% internal carotid artery stenosis. Left: No evidence of hemodynamically significant internal carotid artery stenosis. Bilateral: Vertebral artery flow is antegrade.    Transcranial Doppler 03/20/12 shows elevated Rt MCA velocities s/o moderate stenosis.  CXR   03/17/2012  Slight atelectasis at the left lung base. 03/20/2012 LLL infiltrate (abx)  EKG  normal sinus rhythm, RBBB.   EEG    Rt hemispheric slowing with focal sharp waves at F  but no definite epileptiform activity.  Carotid dopplers Right: 60-79% internal carotid artery stenosis. Left: No evidence of hemodynamically significant internal carotid artery stenosis. Bilateral: Vertebral artery flow is antegrade.   Therapy Recommendations PT - CIR ; OT - CIR ; ST - CIR  Physical Exam    .Awake alert. Afebrile. Head is nontraumatic. Neck is supple without bruit. Hearing is normal. Cardiac exam no murmur or gallop. Lungs are clear to auscultation. Distal pulses are well felt.  Neurological Exam : Alert and able to open eyes  Easily and follows few simple commands. Mild right gaze preference but can look barely to left past midline. Decrease blink to threat on left. Pupils 4 mm equal and reactive. Dysarthric speech. Tongue midline. Dense left hemiplegia with flaccidity.mild left hemineglect and sensory loss present. Minimal withdrawal to pain on left side.purposeful antigravity right sided movements present.   Plantars left upgoing and right is downgoing.  ASSESSMENT Shaun Pugh is a 61 y.o. male presenting with HA, left sided weakness and numbness along with slurred speech. Status post IV t-PA 03/17/2012 at 1408.Initial imaging study and CTA showed no large  vessel occlusion but condition deteriorated 12 hours post tpa and suspect new thromboembolism. Initial imaging confirms large patchy right MCA infarct.  Carotid doppler and CTA confirms R ICA stenosis. Felt to have an artery to artery embolis from right ICA - initial TPA likely opened, then reoccluded post thrombolysis. On aspirin 325 mg orally every day prior to admission. Now on Aspirin 325 mg and plavix orally given ICA stenosis.   Patient with resultant agitation, lethargy, left hemiparesis, seizures, dysarthria, visual defect.  Overnight, increased agitation and hallucination, likely related to stroke, could be related to seizure retinal artery occlusion July 2013 Concern with right retina embolisms last week Hyperlipidemia, LDL 114, on statin PTA now, goal LDL < 100 CAD, ?MI, definite stent placement approx 2004, on plavix following OSA New onset seizure 03/18/2012, partial, involving the arms and face No known history of atrial fibrillation Thrombocytopenia, 132 will follow Left lung infiltrate consistent with pneumonia, on zosyn (hospital acquired) Symptomatic RICA stenosis but will wait on revascularization till after rehab due to large stroke deficits  Hospital day # 5  TREATMENT/PLAN  Continue aspirin 325 mg orally every day for secondary stroke prevention and plavix 75 mg daily  Add seroquel 12.5 mg at night  CIR following for disposition   Patients disease state was discussed with patient and  Wife.  Annie Main, MSN, RN, ANVP-BC, ANP-BC, [facility] Pager: [phone] 03/22/2012 9:06 AM  Scribe for Dr. Delia Heady, Stroke Center Medical Director, who has personally reviewed chart, pertinent data, examined the patient and developed the plan of care. Pager:  [phone]

## 2012-03-23 ENCOUNTER — Inpatient Hospital Stay (HOSPITAL_COMMUNITY): Payer: Medicare Other

## 2012-03-23 ENCOUNTER — Inpatient Hospital Stay (HOSPITAL_COMMUNITY)
Admission: RE | Admit: 2012-03-23 | Discharge: 2012-04-20 | DRG: 945 | Disposition: A | Discharge status: Home Health | Payer: Medicare Other | Source: Ambulatory Visit | Attending: Physical Medicine & Rehabilitation | Admitting: Physical Medicine & Rehabilitation

## 2012-03-23 DIAGNOSIS — G4700 Insomnia, unspecified: Secondary | ICD-10-CM | POA: Diagnosis present

## 2012-03-23 DIAGNOSIS — R569 Unspecified convulsions: Secondary | ICD-10-CM | POA: Diagnosis present

## 2012-03-23 DIAGNOSIS — Z9861 Coronary angioplasty status: Secondary | ICD-10-CM

## 2012-03-23 DIAGNOSIS — R443 Hallucinations, unspecified: Secondary | ICD-10-CM | POA: Diagnosis present

## 2012-03-23 DIAGNOSIS — D72829 Elevated white blood cell count, unspecified: Secondary | ICD-10-CM | POA: Diagnosis not present

## 2012-03-23 DIAGNOSIS — R339 Retention of urine, unspecified: Secondary | ICD-10-CM | POA: Diagnosis present

## 2012-03-23 DIAGNOSIS — K5900 Constipation, unspecified: Secondary | ICD-10-CM | POA: Diagnosis present

## 2012-03-23 DIAGNOSIS — G8190 Hemiplegia, unspecified affecting unspecified side: Secondary | ICD-10-CM | POA: Diagnosis present

## 2012-03-23 DIAGNOSIS — I2510 Atherosclerotic heart disease of native coronary artery without angina pectoris: Secondary | ICD-10-CM | POA: Diagnosis present

## 2012-03-23 DIAGNOSIS — I10 Essential (primary) hypertension: Secondary | ICD-10-CM | POA: Diagnosis present

## 2012-03-23 DIAGNOSIS — I6340 Cerebral infarction due to embolism of unspecified cerebral artery: Secondary | ICD-10-CM | POA: Diagnosis present

## 2012-03-23 DIAGNOSIS — F17200 Nicotine dependence, unspecified, uncomplicated: Secondary | ICD-10-CM | POA: Diagnosis present

## 2012-03-23 DIAGNOSIS — Z5189 Encounter for other specified aftercare: Principal | ICD-10-CM

## 2012-03-23 DIAGNOSIS — R4789 Other speech disturbances: Secondary | ICD-10-CM | POA: Diagnosis present

## 2012-03-23 DIAGNOSIS — J189 Pneumonia, unspecified organism: Secondary | ICD-10-CM | POA: Diagnosis present

## 2012-03-23 DIAGNOSIS — Z9282 Status post administration of tPA (rtPA) in a different facility within the last 24 hours prior to admission to current facility: Secondary | ICD-10-CM | POA: Diagnosis not present

## 2012-03-23 DIAGNOSIS — Z8673 Personal history of transient ischemic attack (TIA), and cerebral infarction without residual deficits: Secondary | ICD-10-CM | POA: Diagnosis not present

## 2012-03-23 DIAGNOSIS — I639 Cerebral infarction, unspecified: Secondary | ICD-10-CM

## 2012-03-23 DIAGNOSIS — I63432 Cerebral infarction due to embolism of left posterior cerebral artery: Secondary | ICD-10-CM | POA: Diagnosis present

## 2012-03-23 LAB — GLUCOSE, CAPILLARY: Glucose-Capillary: 88 mg/dL (ref 70–99)

## 2012-03-23 MED ORDER — ACETAMINOPHEN 325 MG PO TABS
325.0000 mg | ORAL_TABLET | ORAL | Status: DC | PRN
  Administered 2012-03-25 – 2012-03-26 (×2): 650 mg via ORAL
  Filled 2012-03-23 (×2): qty 2

## 2012-03-23 MED ORDER — PROCHLORPERAZINE EDISYLATE 5 MG/ML IJ SOLN
5.0000 mg | Freq: Four times a day (QID) | INTRAMUSCULAR | Status: DC | PRN
  Filled 2012-03-23: qty 2

## 2012-03-23 MED ORDER — PROCHLORPERAZINE MALEATE 5 MG PO TABS
5.0000 mg | ORAL_TABLET | Freq: Four times a day (QID) | ORAL | Status: DC | PRN
  Filled 2012-03-23: qty 2

## 2012-03-23 MED ORDER — CLOPIDOGREL BISULFATE 75 MG PO TABS
75.0000 mg | ORAL_TABLET | Freq: Every day | ORAL | Status: DC
  Administered 2012-03-24 – 2012-04-20 (×28): 75 mg via ORAL
  Filled 2012-03-23 (×30): qty 1

## 2012-03-23 MED ORDER — PROCHLORPERAZINE 25 MG RE SUPP
12.5000 mg | Freq: Four times a day (QID) | RECTAL | Status: DC | PRN
  Filled 2012-03-23: qty 1

## 2012-03-23 MED ORDER — FLEET ENEMA 7-19 GM/118ML RE ENEM: 1.0000 | ENEMA | Freq: Once | RECTAL | Status: AC | PRN

## 2012-03-23 MED ORDER — PANTOPRAZOLE SODIUM 40 MG PO TBEC
40.0000 mg | DELAYED_RELEASE_TABLET | Freq: Every day | ORAL | Status: DC
  Administered 2012-03-24 – 2012-04-20 (×27): 40 mg via ORAL
  Filled 2012-03-23 (×30): qty 1

## 2012-03-23 MED ORDER — DM-GUAIFENESIN ER 30-600 MG PO TB12
1.0000 | ORAL_TABLET | Freq: Two times a day (BID) | ORAL | Status: DC
  Administered 2012-03-23: 1 via ORAL
  Administered 2012-03-24 (×2): via ORAL
  Administered 2012-03-25 – 2012-03-27 (×5): 1 via ORAL
  Administered 2012-03-27 – 2012-03-28 (×2): via ORAL
  Administered 2012-03-28 – 2012-04-01 (×6): 1 via ORAL
  Filled 2012-03-23 (×30): qty 1

## 2012-03-23 MED ORDER — POLYETHYLENE GLYCOL 3350 17 G PO PACK
17.0000 g | PACK | Freq: Every day | ORAL | Status: DC | PRN
  Administered 2012-03-25 – 2012-04-14 (×2): 17 g via ORAL
  Filled 2012-03-23 (×3): qty 1

## 2012-03-23 MED ORDER — SODIUM CHLORIDE 0.9 % IV SOLN
1250.0000 mg | Freq: Two times a day (BID) | INTRAVENOUS | Status: DC
  Administered 2012-03-23 – 2012-03-24 (×3): 1250 mg via INTRAVENOUS
  Filled 2012-03-23 (×5): qty 12.5

## 2012-03-23 MED ORDER — ALUM & MAG HYDROXIDE-SIMETH 200-200-20 MG/5ML PO SUSP: 30.0000 mL | ORAL | Status: DC | PRN

## 2012-03-23 MED ORDER — DIPHENHYDRAMINE HCL 12.5 MG/5ML PO ELIX: 12.5000 mg | ORAL_SOLUTION | Freq: Four times a day (QID) | ORAL | Status: DC | PRN

## 2012-03-23 MED ORDER — ENOXAPARIN SODIUM 40 MG/0.4ML ~~LOC~~ SOLN
40.0000 mg | SUBCUTANEOUS | Status: DC
  Administered 2012-03-23 – 2012-04-19 (×28): 40 mg via SUBCUTANEOUS
  Filled 2012-03-23 (×31): qty 0.4

## 2012-03-23 MED ORDER — TRAZODONE HCL 50 MG PO TABS
50.0000 mg | ORAL_TABLET | Freq: Every day | ORAL | Status: DC
  Administered 2012-03-23 – 2012-03-24 (×2): 50 mg via ORAL
  Filled 2012-03-23 (×2): qty 1

## 2012-03-23 MED ORDER — ATORVASTATIN CALCIUM 20 MG PO TABS
20.0000 mg | ORAL_TABLET | Freq: Every day | ORAL | Status: DC
  Administered 2012-03-24 – 2012-04-19 (×27): 20 mg via ORAL
  Filled 2012-03-23 (×30): qty 1

## 2012-03-23 MED ORDER — IPRATROPIUM BROMIDE 0.02 % IN SOLN: 0.5000 mg | RESPIRATORY_TRACT | Status: DC | PRN

## 2012-03-23 MED ORDER — BISACODYL 10 MG RE SUPP
10.0000 mg | Freq: Every day | RECTAL | Status: DC | PRN
  Administered 2012-04-04 – 2012-04-09 (×2): 10 mg via RECTAL
  Filled 2012-03-23 (×3): qty 1

## 2012-03-23 MED ORDER — ASPIRIN EC 325 MG PO TBEC
325.0000 mg | DELAYED_RELEASE_TABLET | Freq: Every day | ORAL | Status: DC
  Administered 2012-03-24 – 2012-04-20 (×28): 325 mg via ORAL
  Filled 2012-03-23 (×31): qty 1

## 2012-03-23 MED ORDER — FLUTICASONE PROPIONATE 50 MCG/ACT NA SUSP
1.0000 | Freq: Every day | NASAL | Status: DC
  Administered 2012-03-23 – 2012-04-19 (×14): 1 via NASAL
  Filled 2012-03-23: qty 16

## 2012-03-23 MED ORDER — ALBUTEROL SULFATE (5 MG/ML) 0.5% IN NEBU: 2.5000 mg | INHALATION_SOLUTION | RESPIRATORY_TRACT | Status: DC | PRN

## 2012-03-23 MED ORDER — GUAIFENESIN-DM 100-10 MG/5ML PO SYRP: 5.0000 mL | ORAL_SOLUTION | Freq: Four times a day (QID) | ORAL | Status: DC | PRN

## 2012-03-23 NOTE — Discharge Summary (Signed)
Stroke Discharge Summary  Patient ID: jabin kalich   MRN: [id number]      DOB: Oct 08, 1950  Date of Admission: 03/17/2012 Date of Discharge: 03/23/2012  Attending Physician:  Darcella Cheshire, MD, Stroke MD  Consulting Physician(s):  Treatment Team:  Md Stroke, MD rehabilitation medicine  Patient's PCP:  [name], MD  Discharge Diagnoses:  Active Problems:  Current every day smoker  Hemiplegia, unspecified, affecting nondominant side  Stroke, acute, embolic, right  Seizure  Cytotoxic cerebral edema  Aortic thromboembolism BMI  Body mass index is 33.65 kg/(m^2).  Past Medical History  Diagnosis Date  . High cholesterol   . Atrophy of right kidney 09/15/2011  . Kidney calculi 09/15/2011  . Acute pancreatitis 09/14/2011  . Cholelithiasis 09/14/2011  . Chronic back pain   . Sleep apnea     STOP BANG 5  . TIA (transient ischemic attack)   . CAD (coronary artery disease)   . Stented coronary artery approx 2004   Past Surgical History  Procedure Date  . Back surgery     x 4   . Cholecystectomy 09/25/2011    Procedure: LAPAROSCOPIC CHOLECYSTECTOMY;  Surgeon: Fabio Bering, MD;  Location: [facility];  Service: General;  Laterality: N/A;    Medications to be continued on Rehab    . aspirin EC  325 mg Oral Daily  . atorvastatin  20 mg Oral q1800  . clopidogrel  75 mg Oral Q breakfast  . enoxaparin (LOVENOX) injection  40 mg Subcutaneous Q24H  . levetiracetam  1,250 mg Intravenous Q12H  . pantoprazole  40 mg Oral Daily  . QUEtiapine  12.5 mg Oral QHS    LABORATORY STUDIES CBC    Component Value Date/Time   WBC 10.4 03/22/2012 0700   RBC 4.12* 03/22/2012 0700   HGB 12.7* 03/22/2012 0700   HCT 36.1* 03/22/2012 0700   PLT 165 03/22/2012 0700   MCV 87.6 03/22/2012 0700   MCH 30.8 03/22/2012 0700   MCHC 35.2 03/22/2012 0700   RDW 13.6 03/22/2012 0700   LYMPHSABS 2.9 03/20/2012 0535   MONOABS 1.9* 03/20/2012 0535   EOSABS 0.2 03/20/2012 0535   BASOSABS 0.0 03/20/2012  0535   CMP    Component Value Date/Time   NA 139 03/20/2012 0535   K 3.6 03/20/2012 0535   CL 107 03/20/2012 0535   CO2 23 03/20/2012 0535   GLUCOSE 99 03/20/2012 0535   BUN 28* 03/20/2012 0535   CREATININE 0.95 03/20/2012 0535   CALCIUM 8.3* 03/20/2012 0535   PROT 8.0 03/17/2012 1315   ALBUMIN 3.9 03/17/2012 1315   AST 15 03/17/2012 1315   ALT 19 03/17/2012 1315   ALKPHOS 76 03/17/2012 1315   BILITOT 0.4 03/17/2012 1315   GFRNONAA 87* 03/20/2012 0535   GFRAA >90 03/20/2012 0535   COAGS Lab Results  Component Value Date   INR 1.01 03/17/2012   Lipid Panel    Component Value Date/Time   CHOL 179 03/18/2012 0420   TRIG 163* 03/18/2012 0420   HDL 32* 03/18/2012 0420   CHOLHDL 5.6 03/18/2012 0420   VLDL 33 03/18/2012 0420   LDLCALC 114* 03/18/2012 0420   HgbA1C  Lab Results  Component Value Date   HGBA1C 5.9* 03/18/2012   Cardiac Panel (last 3 results) No results found for this basename: CKTOTAL:3,CKMB:3,TROPONINI:3,RELINDX:3 in the last 72 hours Urinalysis    Component Value Date/Time   COLORURINE YELLOW 03/21/2012 2130   APPEARANCEUR HAZY* 03/21/2012 2130   LABSPEC 1.015 03/21/2012  2130   PHURINE 7.0 03/21/2012 2130   GLUCOSEU NEGATIVE 03/21/2012 2130   HGBUR MODERATE* 03/21/2012 2130   BILIRUBINUR NEGATIVE 03/21/2012 2130   KETONESUR NEGATIVE 03/21/2012 2130   PROTEINUR NEGATIVE 03/21/2012 2130   UROBILINOGEN 4.0* 03/21/2012 2130   NITRITE NEGATIVE 03/21/2012 2130   LEUKOCYTESUR NEGATIVE 03/21/2012 2130   Urine Drug Screen  No results found for this basename: labopia, cocainscrnur, labbenz, amphetmu, thcu, labbarb    Alcohol Level No results found for this basename: eth    SIGNIFICANT DIAGNOSTIC STUDIES  CT of the brain  03/17/2012 No acute intracranial abnormality seen.  03/17/2012 Paranasal sinus mucosal thickening and fluid in the left mastoid air cells. Otherwise no acute intracranial pathology.  11/10/2013No significant change in the appearance of large  right middle cerebral artery distribution infarct.   Ct Angio Head 03/17/2012 1. Decreased enhancement of the right MCA M2 branches of the anterior sylvian division. No major branch occlusion, no intracranial hemodynamically significant stenosis identified. Otherwise minimal to mild intracranial atherosclerosis. 2. Subtle cortical hypodensity in the anterior right superior frontal gyrus suspicious for ischemia in this setting. No mass effect or hemorrhage. 3. Age indeterminate but favor chronic lacunar infarct in the right thalamus.   Ct Angio Neck 03/17/2012 1. Ulcerated plaque at the right ICA origin resulting in 60% right ICA stenosis. 2. Findings suspicious for 10 mm of focal thrombus in the proximal aortic arch (see axial image 6). This was discussed with Dr. Thana Farr at 1609 hours on 03/17/2012. 3. Mild left carotid atherosclerosis. Dominant right vertebral artery. 4. See intracranial findings below. 5. Small 9 mm nodule in the right parotid gland is indeterminate.   MRI of the brain claustophobic   MRA of the brain See CTA brain   2D Echocardiogram Left ventricle: The cavity size was normal. Wall thicknesswas increased in a pattern of moderate LVH. Systolicfunction was vigorous. The estimated ejection fraction wasin the range of 65% to 70%. Regional wall motioabnormalities cannot be excluded. Left ventriculardiastolic function parameters were normal.- Left atrium: The atrium was mildly dilated. Impressions:- No cardiac source of emboli was indentified   Carotid DopplerRight: 60-79% internal carotid artery stenosis. Left: No evidence of hemodynamically significant internal carotid artery stenosis. Bilateral: Vertebral artery flow is antegrade.   Transcranial Doppler 03/20/12 shows elevated Rt MCA velocities s/o moderate stenosis.   CXR  03/17/2012 Slight atelectasis at the left lung base.  03/20/2012 LLL infiltrate (abx)   EKG normal sinus rhythm, RBBB.   EEG Rt hemispheric slowing  with focal sharp waves at F but no definite epileptiform activity.   Carotid dopplers Right: 60-79% internal carotid artery stenosis. Left: No evidence of hemodynamically significant internal carotid artery stenosis. Bilateral: Vertebral artery flow is antegrade   History of Present Illness    MORDEKAI WILLE is an 61 y.o. male who has a complicated history over the last 6 months. Back in July patient was having visual issues with his left visual field. He had seen a opthalmologist who diagnosed him with a retinal artery occlusion. He was placed on ASA at that time. Since that date he has been having issues with dizziness and headaches off and on. He recently was seen by opthalmologist who stated he visualized multiple small embolisms in right retina and then 2 weeks ago was seen by neurologist at [facility] who was concerned for possible strokes/TIA's due to his symptoms of dizziness and HA. These symptoms were not persistent. No specific diagnosis of CVA was made at that  time. He was scheduled to have a MRI of brain (he is claustrophobia) as out patient next Monday. Today 03/17/2012 he has complained of a HA at 9 AM but this cleared over time. At 11AM He again complained of HA, left sided weakness and numbness along with slurred speech. EMS was called and patient was brought to ED as Code stroke. In depth discussion was made with wife about all the risks and benefits. Decision was made to administer tPA. He was admitted to the neuro ICU for further evaluation and treatment.patient saw opthalmologist this week who noticed new retinal emboli on eye exam as per his wife   Hospital Course   Patient was treated with tPA. Initial imaging study and CTA showed no large vessel occlusion but condition deteriorated 12 hours post tpa and suspect new thromboembolism from aortic arch thrombus. Initial imaging confirms large patchy right MCA infarct. Infarct felt to be embolic secondary to aortic arch  thrombus.CT shows 2 separate left MCA pareital and frontal branch infarcts suspect of different ages.Carotid doppler and CTA confirms R ICA stenosis. Felt to have an artery to artery embolis from right ICA - initial TPA likely opened, then reoccluded post thrombolysis.Patient on aspirin 325mg  daily prior to admission will add plavix 75mg  to that regimen after neurological event.  Patient with resultant agitation, lethargy, left hemiparesis, seizures, dysarthria, visual defect.  Patient has Stroke risk factors of hyperlipidemia, vascular disease.  Physical therapy, occupational therapy and speech therapy evaluated patient. All agreed inpatient rehab is needed. Patient's family is/are supportive and can provide care at discharge. CIR bed is available today and patient will be transferred there.  Discharge Exam  Blood pressure 136/75, pulse 91, temperature 98.8 F (37.1 C), temperature source Oral, resp. rate 18, height 6\' 1"  (1.854 m), weight 115.7 kg (255 lb 1.2 oz), SpO2 98.00%.    Physical Exam .Awake alert. Afebrile. Head is nontraumatic. Neck is supple without bruit. Hearing is normal. Cardiac exam no murmur or gallop. Lungs are clear to auscultation. Distal pulses are well felt.  Neurological Exam : Alert and able to open eyes Easily and follows few simple commands. Mild right gaze preference but can look barely to left past midline. Decrease blink to threat on left. Pupils 4 mm equal and reactive. Dysarthric speech. Tongue midline. Dense left hemiplegia with flaccidity.mild left hemineglect and sensory loss present. Minimal withdrawal to pain on left side.purposeful antigravity right sided movements present. Plantars left upgoing and right is downgoing  Discharge Diet  Dysphagia 3 thin liquids  Discharge Plan  Disposition:  Transfer to [facility] for ongoing PT, OT and ST  aspirin 325 mg orally every day and clopidogrel 75 mg orally every day for secondary stroke  prevention.  Ongoing risk factor control by Primary Care Physician. Risk factor recommendations:  Hypertension target range 130-140/70-80 Lipid range - LDL < 100 and checked every 6 months, fasting Diabetes - HgB A1C <7   Follow-up HALL,ZACK, MD in 1 month.  Follow-up with Dr. Delia Heady in 2 months.  Signed  Guy Franco, PAC,  MBA, MHA [facility] Pager: [phone] 03/23/2012 4:40 PM  Scribe for Dr. Delia Heady, Stroke Center Medical Director. He has personally reviewed chart, pertinent data, examined the patient and developed the plan of care. Pager:  [phone]

## 2012-03-23 NOTE — Progress Notes (Signed)
PT Progress Note:       03/23/12 0946  PT Visit Information  Last PT Received On 03/23/12  Assistance Needed +3 or more  PT/OT Co-Evaluation/Treatment Yes  PT Time Calculation  PT Start Time 0903  PT Stop Time 0937  PT Time Calculation (min) 34 min  Subjective Data  Subjective "you people are here again?" (Stated with humor in voice)  Patient Stated Goal Pt wants to hurry up and go home  Precautions  Precautions Fall  Restrictions  Weight Bearing Restrictions No  Cognition  Overall Cognitive Status Impaired  Area of Impairment Attention;Following commands;Safety/judgement;Awareness of errors;Awareness of deficits;Problem solving;Executive functioning;Memory  Arousal/Alertness Awake/alert  Orientation Level Appears intact for tasks assessed;Oriented X4 / Intact  Behavior During Session [facility] for tasks performed  Attention - Other Comments VC to stay focused on tasks  Memory Deficits Frequent repitition of activity instruction and minimal recall of recently discussed topics (ie why pt. should look towards his left side)  Following Commands Follows one step commands inconsistently  Safety/Judgement Decreased awareness of safety precautions;Decreased awareness of need for assistance  Awareness of Errors Assistance required to identify errors made;Assistance required to correct errors made  Awareness of Deficits Recognizes his left sided weakness when asked, however does not acknowledge deficits when preparing to move or those related to vision  Cognition - Other Comments Pt. presents to be more aware of his left side when still, but still neglecting with activity.  Bed Mobility  Bed Mobility Rolling Right;Right Sidelying to Sit;Sitting - Scoot to Edge of Bed  Rolling Right 1: +2 Total assist;With rail  Rolling Right: Patient Percentage 10%  Right Sidelying to Sit 1: +2 Total assist;HOB flat;With rails  Right Sidelying to Sit: Patient Percentage 10%  Sitting - Scoot to Edge of Bed 1:  +2 Total assist  Sitting - Scoot to Edge of Bed: Patient Percentage 10%  Details for Bed Mobility Assistance +3 total (A) for all components of bed mobility and max VC for technique. Use of draw pad to (A) with getting to EOB.  Transfers  Transfers Sit to Stand;Stand to Sit;Stand Pivot Transfers  Sit to Stand 1: +2 Total assist;From elevated surface;From bed  Sit to Stand: Patient Percentage 20%  Stand to Sit 1: +2 Total assist;To chair/3-in-1;To bed  Stand to Sit: Patient Percentage 20%  Stand Pivot Transfers 1: +2 Total assist;From elevated surface  Stand Pivot Transfers: Patient Percentage 20%  Details for Transfer Assistance Third person was used during transfers to facilitate full pelvic extension to mimic typical standing posture. Pt requiring max assist and cueing to intiate sit <>stand (pt leaning posteriorly and resisting transfer). Pt. given max encouragement to pull his bottom underneath him. Facilitation for hip extension remained until pt. descending into recliner.  Ambulation/Gait  Ambulation/Gait Assistance Not tested (comment)  Balance  Balance Assessed Yes  Static Sitting Balance  Static Sitting - Balance Support Feet supported;Right upper extremity supported;No upper extremity supported  Static Sitting - Level of Assistance 5: Stand by assistance;4: Min assist;3: Mod assist  Static Sitting - Comment/# of Minutes Pt. sat EOB for ~15 minues working on sitting in midline, reaching across midline with Rt. UE, allowing WBing through Rt. elbow with lateral leans and returns to midline. Verbal/tactile cues for pt. to use trunk mucles for his posture and sitting in midline. Varying ranges of (A) provided throughout sitting at EOB. Pt. still has tendency to push with his Rt. UE, but frequency has decreased since last session. Pt. able to  sit min guard for ~26minx3 while at EOB.  Exercises  Exercises General Upper Extremity  General Exercises - Upper Extremity  Shoulder Flexion  PROM;Left;10 reps;Seated  Shoulder Extension PROM;Left;10 reps;Seated  Shoulder ABduction PROM;Left;10 reps;Seated  Shoulder ADduction PROM;Left;10 reps;Seated  Elbow Flexion PROM;Left;10 reps;Seated  Elbow Extension PROM;Left;10 reps;Seated  Wrist Flexion PROM;Left;10 reps;Seated  Wrist Extension PROM;Left;10 reps;Seated  Digit Composite Flexion PROM;Left;10 reps;Seated  Composite Extension PROM;Left;10 reps;Seated  PT - End of Session  Equipment Utilized During Treatment Gait belt  Activity Tolerance Patient tolerated treatment well  Patient left in chair;with call bell/phone within reach;with family/visitor present (Pt's wife)  Nurse Communication Mobility status  PT - Assessment/Plan  Comments on Treatment Session Pt. very pleasant throughout session and conversive/joking. Pt. sat EOB with varying levels of (A) and transferred to recliner with facilition for hip extension. Pt. given max cues throughout session for acknowledging his Lt. side and looking towards the left.   PT Plan Discharge plan remains appropriate  PT Frequency Min 4X/week  Recommendations for Other Services Rehab consult  Follow Up Recommendations CIR;Supervision/Assistance - 24 hour  Equipment Recommended Other (comment) (TBD)  Acute Rehab PT Goals  PT Goal Formulation With family  Time For Goal Achievement 04/02/12  Potential to Achieve Goals Fair  Pt will go Supine/Side to Sit with mod assist  PT Goal: Supine/Side to Sit - Progress Not met  Pt will Sit at [facility] of Bed 3-5 min;with bilateral upper extremity support;with min assist  PT Goal: Sit at Edge Of Bed - Progress Progressing toward goal  Pt will go Sit to Supine/Side with mod assist  Pt will go Sit to Stand with max assist  PT Goal: Sit to Stand - Progress Not met  Pt will go Stand to Sit with max assist  PT Goal: Stand to Sit - Progress Not met  Pt will Transfer Bed to Chair/Chair to Bed with max assist  PT Transfer Goal: Bed to Chair/Chair to Bed  - Progress Not met  PT General Charges  $$ ACUTE PT VISIT 1 Procedure  PT Treatments  $Therapeutic Activity 8-22 mins  $Neuromuscular Re-education 8-22 mins     Mertie Clause, SPTA 03/23/2012    Verdell Face, PTA [phone] 03/23/2012

## 2012-03-23 NOTE — Progress Notes (Signed)
Patient ID: Shaun Pugh, male   DOB: 11/20/1950, 61 y.o.   MRN: [id number] Reviewed PT and OT notes.  Discussed with Rehab RN.  Able to tolerate therapy.  Rec CIR

## 2012-03-23 NOTE — Progress Notes (Signed)
I met with pt at bedside as he is working with therapy and then met with his wife. I am pursuing insurance approval to admit pt to inpt rehab today. [phone]

## 2012-03-23 NOTE — Progress Notes (Signed)
Stroke Team Progress Note  HISTORY Shaun Pugh is an 61 y.o. male who has a complicated history over the last 6 months. Back in July Shaun Pugh was having visual issues with his left visual field. He had seen a opthalmologist who diagnosed him with a retinal artery occlusion. He was placed on ASA at that time. Since that date he has been having issues with dizziness and headaches off and on. He recently was seen by opthalmologist who stated he visualized multiple small embolisms in right retina and then 2 weeks ago was seen by neurologist at [facility] who was concerned for possible strokes/TIA's due to his symptoms of dizziness and HA. These symptoms were not persistent. No specific diagnosis of CVA was made at that time. He was scheduled to have a MRI of brain (he is claustrophobia) as out Shaun Pugh next Monday. Today 03/17/2012 he has complained of a HA at 9 AM but this cleared over time. At 11AM He again complained of HA, left sided weakness and numbness along with slurred speech. EMS was called and Shaun Pugh was brought to ED as Code stroke. In depth discussion was made with wife about all the risks and benefits. Decision was made to administer tPA. He was admitted to the neuro ICU for further evaluation and treatment.Shaun Pugh saw opthalmologist this week who noticed new retinal emboli on eye exam as per his wife.  SUBJECTIVE His wife is at the bedside.  Shaun Pugh had a "rough" night per wife - hallucinating, agitated, did not sleep.  OBJECTIVE Most recent Vital Signs: Filed Vitals:   03/22/12 1909 03/22/12 2144 03/23/12 0236 03/23/12 0626  BP: 147/85 157/93 151/82 144/75  Pulse: 83 91 78 88  Temp: 97.4 F (36.3 C) 99.1 F (37.3 C) 98.3 F (36.8 C) 98.1 F (36.7 C)  TempSrc:  Oral Oral Oral  Resp: 20 20 20 20   Height:      Weight:      SpO2: 98% 99% 97% 96%   IV Fluid Intake:      . sodium chloride 75 mL/hr at 03/21/12 1829   MEDICATIONS     . aspirin EC  325 mg Oral Daily  .  atorvastatin  20 mg Oral q1800  . clopidogrel  75 mg Oral Q breakfast  . enoxaparin (LOVENOX) injection  40 mg Subcutaneous Q24H  . levetiracetam  1,250 mg Intravenous Q12H  . pantoprazole  40 mg Oral Daily  . QUEtiapine  12.5 mg Oral QHS  . [DISCONTINUED] pantoprazole (PROTONIX) IV  40 mg Intravenous QHS   PRN:  acetaminophen, acetaminophen, albuterol, HYDROmorphone (DILAUDID) injection, ipratropium, labetalol, ondansetron (ZOFRAN) IV, senna-docusate  Diet:  DYS 3 thin liquids Activity:  As tolerated DVT Prophylaxis:  SCDs and Lovenox 40 mg sq daily   CLINICALLY SIGNIFICANT STUDIES Basic Metabolic Panel:   Lab 03/20/12 0535 03/17/12 1353 03/17/12 1315  NA 139 140 --  K 3.6 3.9 --  CL 107 106 --  CO2 23 -- 22  GLUCOSE 99 106* --  BUN 28* 19 --  CREATININE 0.95 1.20 --  CALCIUM 8.3* -- 9.7  MG -- -- --  PHOS -- -- --   Liver Function Tests:   Lab 03/17/12 1315  AST 15  ALT 19  ALKPHOS 76  BILITOT 0.4  PROT 8.0  ALBUMIN 3.9   CBC:   Lab 03/22/12 0700 03/20/12 0535 03/17/12 1315  WBC 10.4 16.1* --  NEUTROABS -- 11.1* 9.3*  HGB 12.7* 13.3 --  HCT 36.1* 38.4* --  MCV 87.6 89.5 --  PLT 165 139* --   Coagulation:   Lab 03/17/12 1315  LABPROT 13.2  INR 1.01   Cardiac Enzymes:   Lab 03/17/12 1315  CKTOTAL 34  CKMB 1.6  CKMBINDEX --  TROPONINI <0.30   Urinalysis:   Lab 03/21/12 2130 03/18/12 1539  COLORURINE YELLOW YELLOW  LABSPEC 1.015 1.024  PHURINE 7.0 6.0  GLUCOSEU NEGATIVE NEGATIVE  HGBUR MODERATE* NEGATIVE  BILIRUBINUR NEGATIVE NEGATIVE  KETONESUR NEGATIVE NEGATIVE  PROTEINUR NEGATIVE NEGATIVE  UROBILINOGEN 4.0* 1.0  NITRITE NEGATIVE NEGATIVE  LEUKOCYTESUR NEGATIVE NEGATIVE   Lipid Panel    Component Value Date/Time   CHOL 179 03/18/2012 0420   TRIG 163* 03/18/2012 0420   HDL 32* 03/18/2012 0420   CHOLHDL 5.6 03/18/2012 0420   VLDL 33 03/18/2012 0420   LDLCALC 114* 03/18/2012 0420   HgbA1C  Lab Results  Component Value Date   HGBA1C 5.9*  03/18/2012   Urine Drug Screen:   No results found for this basename: labopia,  cocainscrnur,  labbenz,  amphetmu,  thcu,  labbarb    Alcohol Level: No results found for this basename: ETH:2 in the last 168 hours  CT of the brain   03/17/2012 No acute intracranial abnormality seen. 03/17/2012  Paranasal sinus mucosal thickening and fluid in the left mastoid air cells.  Otherwise no acute intracranial pathology.    11/10/2013No significant change in the appearance of large right middle cerebral artery distribution infarct.  Ct Angio Head 03/17/2012   1.  Decreased enhancement of the right MCA M2 branches of the anterior sylvian division.  No major branch occlusion, no intracranial hemodynamically significant stenosis identified. Otherwise minimal to mild intracranial atherosclerosis. 2.  Subtle cortical hypodensity in the anterior right superior frontal gyrus suspicious for ischemia in this setting.  No mass effect or hemorrhage. 3.  Age indeterminate but favor chronic lacunar infarct in the right thalamus.      Ct Angio Neck 03/17/2012   1.  Ulcerated plaque at the right ICA origin resulting in 60% right ICA stenosis. 2.  Findings suspicious for 10 mm of focal thrombus in the proximal aortic arch (see axial image 6). This was discussed with Dr. Thana Farr at 1609 hours on 03/17/2012. 3.  Mild left carotid atherosclerosis.  Dominant right vertebral artery. 4.  See intracranial findings below. 5.  Small 9 mm nodule in the right parotid gland is indeterminate.    MRI of the brain  claustophobic  MRA of the brain  See CTA brain  2D Echocardiogram  Left ventricle: The cavity size was normal. Wall thicknesswas increased in a pattern of moderate LVH. Systolicfunction was vigorous. The estimated ejection fraction wasin the range of 65% to 70%. Regional wall motioabnormalities cannot be excluded. Left ventriculardiastolic function parameters were normal.- Left atrium: The atrium was mildly  dilated. Impressions:- No cardiac source of emboli was indentified  Carotid DopplerRight: 60-79% internal carotid artery stenosis. Left: No evidence of hemodynamically significant internal carotid artery stenosis. Bilateral: Vertebral artery flow is antegrade.    Transcranial Doppler 03/20/12 shows elevated Rt MCA velocities s/o moderate stenosis.  CXR   03/17/2012  Slight atelectasis at the left lung base. 03/20/2012 LLL infiltrate (abx)  EKG  normal sinus rhythm, RBBB.   EEG    Rt hemispheric slowing with focal sharp waves at F  but no definite epileptiform activity.  Carotid dopplers Right: 60-79% internal carotid artery stenosis. Left: No evidence of hemodynamically significant internal carotid artery stenosis. Bilateral: Vertebral artery flow  is antegrade.   Therapy Recommendations PT - CIR ; OT - CIR ; ST - CIR  Physical Exam    .Awake alert. Afebrile. Head is nontraumatic. Neck is supple without bruit. Hearing is normal. Cardiac exam no murmur or gallop. Lungs are clear to auscultation. Distal pulses are well felt.  Neurological Exam : Alert and able to open eyes  Easily and follows few simple commands. Mild right gaze preference but can look barely to left past midline. Decrease blink to threat on left. Pupils 4 mm equal and reactive. Dysarthric speech. Tongue midline. Dense left hemiplegia with flaccidity.mild left hemineglect and sensory loss present. Minimal withdrawal to pain on left side.purposeful antigravity right sided movements present.   Plantars left upgoing and right is downgoing.  ASSESSMENT Mr. PRIEST LOCKRIDGE is a 61 y.o. male presenting with HA, left sided weakness and numbness along with slurred speech. Status post IV t-PA 03/17/2012 at 1408.Initial imaging study and CTA showed no large vessel occlusion but condition deteriorated 12 hours post tpa and suspect new thromboembolism. Initial imaging confirms large patchy right MCA infarct.  Carotid doppler and CTA confirms R  ICA stenosis. Felt to have an artery to artery embolis from right ICA - initial TPA likely opened, then reoccluded post thrombolysis. On aspirin 325 mg orally every day prior to admission. Now on Aspirin 325 mg and plavix orally given ICA stenosis.   Shaun Pugh with resultant agitation, lethargy, left hemiparesis, seizures, dysarthria, visual defect.  Overnight, increased agitation and hallucination, likely related to stroke, could be related to seizure. Added seroquel 12.5 mg at night and it seemed to help some. retinal artery occlusion July 2013 Concern with right retina embolisms last week Hyperlipidemia, LDL 114, on statin PTA now, goal LDL < 100 CAD, ?MI, definite stent placement approx 2004, on plavix following OSA New onset seizure 03/18/2012, partial, involving the arms and face No known history of atrial fibrillation Thrombocytopenia, 132 will follow Left lung infiltrate consistent with pneumonia, on zosyn (hospital acquired) Symptomatic RICA stenosis but will wait on revascularization till after rehab due to large stroke deficits  Hospital day # 6  TREATMENT/PLAN  Continue aspirin 325 mg orally every day for secondary stroke prevention and plavix 75 mg daily  Ok for transfer to CIR when bed available   Annie Main, MSN, RN, ANVP-BC, ANP-BC, GNP-BC [facility] Pager: [phone] 03/23/2012 8:39 AM  Scribe for Dr. Delia Heady, Stroke Center Medical Director, who has personally reviewed chart, pertinent data, examined the Shaun Pugh and developed the plan of care. Pager:  [phone]

## 2012-03-23 NOTE — Progress Notes (Signed)
I have insurance approval for admission to inpt rehab today. Annie Main, [facility] aware and in agreement. [phone]

## 2012-03-23 NOTE — Plan of Care (Addendum)
Overall Plan of Care [facility]) Patient Details Name: CHADRICK PFAFF MRN: [id number] DOB: 1951-04-05  Diagnosis:  Rehab for CVA  Primary Diagnosis:    R MCA embolis infarct Co-morbidities: Aortic arch thrombus, dysphagia, dysarthria,l post stroke seizure d/o  Functional Problem List  Patient demonstrates impairments in the following areas: Balance, Cognition, Endurance, Medication Management, Motor, Perception, Safety and Vision  Basic ADL's: eating, grooming, bathing, dressing and toileting   Transfers:  bed mobility, bed to chair, toilet, tub/shower, car and furniture Locomotion:  ambulation, wheelchair mobility and stairs  Additional Impairments:  Functional use of upper extremity, Swallowing, Communication  expression and Social Cognition   problem solving, memory, attention and awareness  Anticipated Outcomes Item Anticipated Outcome  Eating/Swallowing  Set up; Mod I   Basic self-care  Min Assist  Tolieting  Min Assist  Bowel/Bladder  Min assist with bowel and bladder  Transfers  Min Assist basic, car, furniture  Locomotion  Supervision w/c x 150' controlled env, 50' home env Min assist gait x 25' controlled and home; min assist 3 steps  Communication  Mod I   Cognition  Supervision   Pain  n/a  Safety/Judgment    Other     Therapy Plan:   OT Frequency: 1-2 X/day, 60-90 minutes SLP Frequency: 1-2 X/day, 30-60 minutes;5 out of 7 days   Team Interventions: Item RN PT OT SLP SW TR Other  Self Care/Advanced ADL Retraining   x      Neuromuscular Re-Education  x x      Therapeutic Activities  x x x     UE/LE Strength Training/ROM  x x      UE/LE Coordination Activities  x x      Visual/Perceptual Remediation/Compensation   x      DME/Adaptive Equipment Instruction  x x      Therapeutic Exercise  x x      Balance/Vestibular Training  x x      Patient/Family Education x x x x     Cognitive Remediation/Compensation  x x x     Functional Mobility Training  x x       Ambulation/Gait Training  x       Stair Training  x       Wheelchair Propulsion/Positioning  x       Functional Statistician   x      Community Reintegration   x      Dysphagia/Aspiration Printmaker    x     Speech/Language Facilitation    x     Bladder Management         Bowel Management x        Disease Management/Prevention x        Pain Management         Medication Management x        Skin Care/Wound Management         Splinting/Orthotics  x       Discharge Planning  x x x     Psychosocial Support                            Team Discharge Planning: Destination:  Home Projected Follow-up:  Nursing, PT, OT, SLP and Home Health Projected Equipment Needs:  Bedside Commode, Cushion, Information systems manager, Biomedical engineer involved in discharge planning:  Yes  MD ELOS: 3-4 wks Medical Rehab Prognosis:  Good Assessment: 61 yo with Large r  MCA embolic infarct with L flaccid hemiparesis now requiring 24/7 rehab RN, MD, CIR level PT,OT,SLP

## 2012-03-23 NOTE — Progress Notes (Signed)
Occupational Therapy Treatment Patient Details Name: Shaun Pugh MRN: [id number] DOB: Jul 24, 1950 Today's Date: 03/23/2012 Time: 4098-1191 OT Time Calculation (min): 34 min  OT Assessment / Plan / Recommendation Comments on Treatment Session Focus of session on static sitting balance while performing vision tasks.  Pt reports feeling fatigued this morning but continuing to progress toward goals this session.    Follow Up Recommendations  CIR    Barriers to Discharge       Equipment Recommendations  Other (comment) (TBD)    Recommendations for Other Services Rehab consult  Frequency Min 3X/week   Plan Discharge plan remains appropriate    Precautions / Restrictions Precautions Precautions: Fall Restrictions Weight Bearing Restrictions: No   Pertinent Vitals/Pain See vitals    ADL  Upper Body Dressing: Performed;Moderate assistance Where Assessed - Upper Body Dressing: Supported sitting Lower Body Dressing: Performed;+1 Total assistance (bil socks) Where Assessed - Lower Body Dressing: Supported sitting Toilet Transfer: Other (comment) (+3 total) Toilet Transfer: Patient Percentage: 20% Toilet Transfer Method: Stand pivot Acupuncturist: Other (comment) (bed to chair) Equipment Used: Gait belt Transfers/Ambulation Related to ADLs: +3 total assist pt 30% for stand pivot from bed to chair, transferring to pt's right side. Assist to support L UE during transfer and to block left knee while providing manual facilitation through bilateral posterior hips to guide pt to chair and maintain upright posture. ADL Comments: No active movement noted in LUE throughout session. Focus of session on sitting balance.  Pt requires max verbal and tactile cueing to turn head to left side in order to locate objects in room.  Able to locate 3 objects on left side with min verbal cueing but requires increased cueing for locating objects on far left of peripheral field.   OT Diagnosis:      OT Problem List:   OT Treatment Interventions:     OT Goals ADL Goals Pt Will Perform Upper Body Dressing: Sitting, chair;Sitting, bed;with set-up;with supervision;Supported ADL Goal: Patent attorney - Progress: Progressing toward goals Pt Will Perform Lower Body Dressing: with max assist;Sit to stand from bed;Sit to stand from chair ADL Goal: Lower Body Dressing - Progress: Progressing toward goals Pt Will Transfer to Toilet: with 2+ total assist;Ambulation;Stand pivot transfer;with DME ADL Goal: Toilet Transfer - Progress: Progressing toward goals Additional ADL Goal #1: Pt will perform dynamic sitting balance activities for greater than with Min A  in prep for seated ADLs ADL Goal: Additional Goal #1 - Progress: Progressing toward goals Additional ADL Goal #2: Pt will locate 10/10 objects throughout the room using scanning techniques and Min VC as a part of or in prep for functional tasks. ADL Goal: Additional Goal #2 - Progress: Progressing toward goals Arm Goals Pt Will Tolerate PROM: with caregiver independent in performing;to maintain range of motion;Left upper extremity;3 sets;10 reps Arm Goal: PROM - Progress: Progressing toward goal  Visit Information  Last OT Received On: 03/23/12 Assistance Needed: +3 or more    Subjective Data      Prior Functioning       Cognition  Overall Cognitive Status: Impaired Area of Impairment: Attention;Following commands;Safety/judgement;Awareness of errors;Awareness of deficits;Problem solving;Executive functioning;Memory Arousal/Alertness: Awake/alert Orientation Level: Appears intact for tasks assessed;Oriented X4 / Intact Behavior During Session: [facility] for tasks performed Current Attention Level: Focused Attention - Other Comments: VC to stay focused on tasks Memory Deficits: Frequent repitition of activity instruction and minimal recall of recently discussed topics (ie why pt. should look towards his left side)  Following  Commands: Follows one step commands inconsistently Safety/Judgement: Decreased awareness of safety precautions;Decreased awareness of need for assistance Awareness of Errors: Assistance required to identify errors made;Assistance required to correct errors made Awareness of Deficits: Recognizes his left sided weakness when asked, however does not acknowledge deficits when preparing to move or those related to vision Cognition - Other Comments: Pt. presents to be more aware of his left side when still, but still neglecting with activity.    Mobility  Shoulder Instructions Bed Mobility Bed Mobility: Rolling Right;Right Sidelying to Sit;Sitting - Scoot to Edge of Bed Rolling Right: 1: +2 Total assist;With rail Rolling Right: Patient Percentage: 10% Right Sidelying to Sit: 1: +2 Total assist;HOB flat;With rails Right Sidelying to Sit: Patient Percentage: 10% Sitting - Scoot to Edge of Bed: 1: +2 Total assist Sitting - Scoot to Edge of Bed: Patient Percentage: 10% Details for Bed Mobility Assistance: +3 total (A) for all components of bed mobility and max VC for technique. Use of draw pad to (A) with getting to EOB. Transfers Transfers: Sit to Stand;Stand to Sit Sit to Stand: 1: +2 Total assist;From elevated surface;From bed Sit to Stand: Patient Percentage: 20% Stand to Sit: 1: +2 Total assist;To chair/3-in-1;To bed Stand to Sit: Patient Percentage: 20% Details for Transfer Assistance: Third person was used during transfers to facilitate full pelvic extension to mimic typical standing posture. Pt requiring max assist and cueing to intiate sit <>stand (pt leaning posteriorly and resisting transfer). Pt. given max encouragement to pull his bottom underneath him. Facilitation for hip extension remained until pt. descending into recliner.       Exercises  General Exercises - Upper Extremity Shoulder Flexion: PROM;Left;10 reps;Seated Shoulder Extension: PROM;Left;10 reps;Seated Shoulder  ABduction: PROM;Left;10 reps;Seated Shoulder ADduction: PROM;Left;10 reps;Seated Elbow Flexion: PROM;Left;10 reps;Seated Elbow Extension: PROM;Left;10 reps;Seated Wrist Flexion: PROM;Left;10 reps;Seated Wrist Extension: PROM;Left;10 reps;Seated Digit Composite Flexion: PROM;Left;10 reps;Seated Composite Extension: PROM;Left;10 reps;Seated   Balance Balance Balance Assessed: Yes Static Sitting Balance Static Sitting - Balance Support: Feet supported;Right upper extremity supported;No upper extremity supported Static Sitting - Level of Assistance: 5: Stand by assistance;4: Min assist;3: Mod assist Static Sitting - Comment/# of Minutes: Pt. sat EOB for ~15 minues working on sitting in midline, reaching across midline with Rt. UE, allowing WBing through Rt. elbow with lateral leans and returns to midline. Verbal/tactile cues for pt. to use trunk mucles for his posture and sitting in midline. Varying ranges of (A) provided throughout sitting at EOB. Pt. still has tendency to push with his Rt. UE, but frequency has decreased since last session. Pt. able to sit min guard for ~30minx3 while at EOB.   End of Session OT - End of Session Equipment Utilized During Treatment: Gait belt Activity Tolerance: Patient tolerated treatment well Patient left: in chair;with call bell/phone within reach;with family/visitor present Nurse Communication: Mobility status;Need for lift equipment  GO    03/23/2012 Cipriano Mile OTR/L Pager [phone] Office [phone]  Cipriano Mile 03/23/2012, 10:35 AM

## 2012-03-23 NOTE — PMR Pre-admission (Signed)
PMR Admission Coordinator Pre-Admission Assessment  Patient: Shaun Pugh is an 61 y.o., male MRN: [id number] DOB: 1950/09/16 Height: 6\' 1"  (185.4 cm) Weight: 115.7 kg (255 lb 1.2 oz)  Insurance Information HMO: yes    PPO:      PCP:      IPA:      80/20:      OTHER:  PRIMARY: Blue Medicare      Policy#: [id number]      Subscriber: pt CM Name: Dyanne Carrel      Phone#: [phone]     Fax#: [phone] Pre-Cert#: [id number] approved 11/13 thru 11/23. Update due 11/21. Follow up case manager will be Rubin Payor at [phone]. Let her know if SNF needed if pt unable to tolerate intensity of CIR      Employer: retired Benefits:  Phone #: [phone]     Name: 11/12 [facility] Date: 05/12/11 contact     Deduct: none      Out of Pocket Max: $3400      Life Max: unlimited CIR: $170 per days 1 through 6       SNF: no copay days 1 through 10, $50 per day days 11- 100. 100 days max per benefit Outpatient: $35 copay per visit     Co-Pay: no visit limit Home Health: 100%      Co-Pay: no visit limit DME: 80%     Co-Pay: 20% Providers: in network  SECONDARY: none        Medicaid Application Date:       Case Manager:  Disability Application Date:       Case Worker:   Emergency Conservator, museum/gallery Information    Name Relation Home Work Mobile   Adrien, Shankar Spouse [phone]       Current Medical History  Patient Admitting Diagnosis: Embolic Right MCA infarct  History of Present Illness:Shaun Pugh is a 61 y.o. male with history of CAD, TIAs with left visual field deficits as well as dizziness/HA. Admitted on 03/17/12 with HA, left sided weakness and numbness with slurred speech. CT head negative and treated with tPA. CTA head/neck with hypodensity right superior gyrus, ulcerated plaque right ICA with 60% ICA stenosis, suspicion for 10mm focal thrombus proximal aortic arch. Patient developed combativeness with agitation as well as focal motor seizures LUE on 03/18/12. He  was treated with ativan and started on Keppra. F/u CCT with extensive area of low density affecting cortical and subcortical right hemisphere in MCA distribution. 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 Placed on ASA and no anticoagulation due to large stroke and risk of bleeding. EEG without epileptiform activity. He did develop fevers and was started on antibiotics for LLL infiltrate. Carotid dopplers done revealing 60-79% ICA stenosis. 2D echo with EF 65-70% with moderate LVH. Mentation is improving with improvement in left inattention and improved ability to follow one step commands. He continues with severe deficits in safety awareness, basic problem solving, sequencing and attention. D3 diet with thin liquids initiated .Total: 12    NIH   Symptomatic RCA stenosis but will wait on revascularization till after rehab due to large stroke deficits. Add seroquel 12.5 mg at night per Stroke team.   Past Medical History  Past Medical History  Diagnosis Date  . High cholesterol   . Atrophy of right kidney 09/15/2011  . Kidney calculi 09/15/2011  . Acute pancreatitis 09/14/2011  . Cholelithiasis 09/14/2011  . Chronic back pain   .  Sleep apnea     STOP BANG 5  . TIA (transient ischemic attack)   . CAD (coronary artery disease)   . Stented coronary artery approx 2004    Family History  family history includes Congestive Heart Failure in his mother; Diabetes in his father; and Heart attack in his father.  There is no history of Anesthesia problems, and Hypotension, and Malignant hyperthermia, and Pseudochol deficiency, .  Prior Rehab/Hospitalizations: none   Current Medications  Current facility-administered medications:0.9 %  sodium chloride infusion, , Intravenous, Continuous, Thana Farr, MD, Last Rate: 75 mL/hr at 03/23/12 1303;  acetaminophen (TYLENOL) suppository 650 mg, 650 mg, Rectal, Q4H PRN, Thana Farr, MD, 650 mg at 03/20/12  1223;  acetaminophen (TYLENOL) tablet 650 mg, 650 mg, Oral, Q4H PRN, Thana Farr, MD, 650 mg at 03/20/12 1823 albuterol (PROVENTIL) (5 MG/ML) 0.5% nebulizer solution 2.5 mg, 2.5 mg, Nebulization, Q4H PRN, Domenick Roma, MD;  aspirin EC tablet 325 mg, 325 mg, Oral, Daily, Cathlyn Parsons, PA-C, 325 mg at 03/23/12 0945;  atorvastatin (LIPITOR) tablet 20 mg, 20 mg, Oral, q1800, Cathlyn Parsons, PA-C, 20 mg at 03/22/12 1900;  clopidogrel (PLAVIX) tablet 75 mg, 75 mg, Oral, Q breakfast, Cathlyn Parsons, PA-C, 75 mg at 03/23/12 0945 enoxaparin (LOVENOX) injection 40 mg, 40 mg, Subcutaneous, Q24H, Micki Riley, MD, 40 mg at 03/23/12 1306;  HYDROmorphone (DILAUDID) injection 2 mg, 2 mg, Intravenous, Q3H PRN, Noel Christmas, 2 mg at 03/17/12 2130;  ipratropium (ATROVENT) nebulizer solution 0.5 mg, 0.5 mg, Nebulization, Q4H PRN, Domenick Roma, MD;  labetalol (NORMODYNE,TRANDATE) injection 10 mg, 10 mg, Intravenous, Q10 min PRN, Thana Farr, MD, 10 mg at 03/17/12 1630 levETIRAcetam (KEPPRA) 1,250 mg in sodium chloride 0.9 % 100 mL IVPB, 1,250 mg, Intravenous, Q12H, Ritta Slot, MD, 1,250 mg at 03/23/12 0945;  ondansetron (ZOFRAN) injection 4 mg, 4 mg, Intravenous, Q6H PRN, Thana Farr, MD, 4 mg at 03/19/12 0436;  pantoprazole (PROTONIX) EC tablet 40 mg, 40 mg, Oral, Daily, Micki Riley, MD, 40 mg at 03/23/12 0945 QUEtiapine (SEROQUEL) tablet 12.5 mg, 12.5 mg, Oral, QHS, Layne Benton, NP, 12.5 mg at 03/22/12 2135;  senna-docusate (Senokot-S) tablet 1 tablet, 1 tablet, Oral, QHS PRN, Thana Farr, MD, 1 tablet at 03/23/12 0945  Patients Current Diet: Dysphagia 3 diet with thin liquids  Precautions / Restrictions Precautions Precautions: Fall Precaution Comments: Left sided weakness and lack of attention Restrictions Weight Bearing Restrictions: No   Prior Activity Level Community (5-7x/wk): active. 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  Home  Assistive Devices / Equipment Home Assistive Devices/Equipment: None Home Adaptive Equipment: None  Prior Functional Level Prior Function Level of Independence: Independent Able to Take Stairs?: Yes Driving: Yes Vocation: Retired Comments: part time at [employer]  Current Functional Level Cognition  Arousal/Alertness: Awake/alert Overall Cognitive Status: Impaired Overall Cognitive Status: Impaired Current Attention Level: Focused Attention - Other Comments: VC to stay focused on tasks Memory Deficits: Frequent repitition of activity instruction and minimal recall of recently discussed topics (ie why pt. should look towards his left side) Orientation Level: Oriented X4;Other (comment) Following Commands: Follows one step commands inconsistently Safety/Judgement: Decreased awareness of safety precautions;Decreased awareness of need for assistance Safety/Judgement - Other Comments: pt attempting to stand and transfer to chair I'ly Awareness of Errors: Assistance required to identify errors made;Assistance required to correct errors made Awareness of Deficits: Recognizes his left sided weakness when asked, however does not acknowledge deficits when  preparing to move or those related to vision Cognition - Other Comments: Pt. presents to be more aware of his left side when still, but still neglecting with activity. Attention: Focused Focused Attention: Impaired Focused Attention Impairment: Verbal basic;Functional basic Memory: Impaired Memory Impairment: Decreased short term memory;Retrieval deficit Decreased Short Term Memory: Verbal basic;Functional basic Awareness: Impaired Awareness Impairment: Intellectual impairment;Emergent impairment;Anticipatory impairment Problem Solving: Impaired Problem Solving Impairment: Verbal basic;Functional basic Executive Function: Reasoning;Self Monitoring;Initiating Reasoning: Impaired Reasoning Impairment: Verbal basic;Functional  basic Initiating: Impaired Self Monitoring: Impaired Self Monitoring Impairment: Verbal basic;Functional basic Behaviors: Restless;Impulsive;Physical agitation;Poor frustration tolerance Safety/Judgment: Impaired    Extremity Assessment (includes Sensation/Coordination)  RUE ROM/Strength/Tone: WFL for tasks assessed RUE Sensation: WFL - Light Touch RUE Coordination: WFL - gross/fine motor  RLE ROM/Strength/Tone: Within functional levels RLE Sensation: WFL - Light Touch    ADLs  Grooming: Minimal assistance;Wash/dry face Where Assessed - Grooming: Supported sitting Upper Body Dressing: Performed;Moderate assistance Where Assessed - Upper Body Dressing: Supported sitting Lower Body Dressing: Performed;+1 Total assistance (bil socks) Lower Body Dressing: Patient Percentage: 10% Where Assessed - Lower Body Dressing: Supported sitting Toilet Transfer: Other (comment) (+3 total) Toilet Transfer: Patient Percentage: 20% Toilet Transfer Method: Stand pivot Acupuncturist: Other (comment) (bed to chair) Equipment Used: Gait belt Transfers/Ambulation Related to ADLs: +3 total assist pt 30% for stand pivot from bed to chair, transferring to pt's right side. Assist to support L UE during transfer and to block left knee while providing manual facilitation through bilateral posterior hips to guide pt to chair and maintain upright posture. ADL Comments: No active movement noted in LUE throughout session. Focus of session on sitting balance.  Pt requires max verbal and tactile cueing to turn head to left side in order to locate objects in room.     Mobility  Bed Mobility: Rolling Right;Right Sidelying to Sit;Sitting - Scoot to Edge of Bed Rolling Right: 1: +2 Total assist;With rail Rolling Right: Patient Percentage: 10% Rolling Left: 1: +2 Total assist Rolling Left: Patient Percentage: 30% Right Sidelying to Sit: 1: +2 Total assist;HOB flat;With rails Right Sidelying to Sit: Patient  Percentage: 10% Left Sidelying to Sit: 1: +2 Total assist Left Sidelying to Sit: Patient Percentage: 30% Supine to Sit: 1: +2 Total assist Supine to Sit: Patient Percentage: 20% Sitting - Scoot to Edge of Bed: 1: +2 Total assist Sitting - Scoot to Edge of Bed: Patient Percentage: 10% Sit to Supine: 1: +2 Total assist Sit to Supine: Patient Percentage: 10%    Transfers  Transfers: Sit to Stand;Stand to Sit;Stand Pivot Transfers Sit to Stand: 1: +2 Total assist;From elevated surface;From bed Sit to Stand: Patient Percentage: 20% Stand to Sit: 1: +2 Total assist;To chair/3-in-1;To bed Stand to Sit: Patient Percentage: 20% Stand Pivot Transfers: 1: +2 Total assist;From elevated surface Stand Pivot Transfers: Patient Percentage: 20% Transfer via Lift Equipment: Sport and exercise psychologist / Gait / Stairs / Psychologist, prison and probation services  Ambulation/Gait Ambulation/Gait Assistance: Not tested (comment)    Posture / Balance Static Sitting Balance Static Sitting - Balance Support: Feet supported;Right upper extremity supported;No upper extremity supported Static Sitting - Level of Assistance: 5: Stand by assistance;4: Min assist;3: Mod assist Static Sitting - Comment/# of Minutes: Pt. sat EOB for ~15 minues working on sitting in midline, reaching across midline with Rt. UE, allowing WBing through Rt. elbow with lateral leans and returns to midline. Verbal/tactile cues for pt. to use trunk mucles for his posture and sitting in midline. Varying ranges of (A)  provided throughout sitting at EOB. Pt. still has tendency to push with his Rt. UE, but frequency has decreased since last session. Pt. able to sit min guard for ~63minx3 while at EOB.     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 ) Type of Home at Discharge:  (unknown; wife has three local children) Do you have any problems obtaining your medications?: No Wife is aware that other d/c arrangements need to be made. They use her parents address and may be able to move in with them. Wife's Mom has Alzheimers.   Social/Family/Support Systems Patient Roles: Spouse;Parent;Other (Comment) (camp ground host) Contact Information: [place] Donovan, wife Anticipated Caregiver: wife and children Anticipated Caregiver's Contact Information: home [phone]; cell [phone] Ability/Limitations of Caregiver: none Caregiver Availability: 24/7 Discharge Plan Discussed with Primary Caregiver: Yes Is Caregiver In Agreement with Plan?: Yes Does Caregiver/Family have Issues with Lodging/Transportation while Pt is in Rehab?: No (wife and family stay with pt 24/7 in hospital)  Goals/Additional Needs Patient/Family Goal for Rehab: min to mod PT, min to mod OT, safe oral intake, adequate cognition for home environment with SLP Expected length of stay: [facility]OS 3 to 4 weeks Dietary Needs: Dysphagia 3 with thin liquids Pt/Family Agrees to Admission and willing to participate: Yes Program Orientation Provided & Reviewed with Pt/Caregiver Including Roles  & Responsibilities: Yes  Patient Condition: Please see physician update to information in consult dated 03/21/2012.  Preadmission Screen Completed By:  Clois Dupes, 03/23/2012 2:29 PM ______________________________________________________________________     Discussed status with Dr. Wynn Banker on 03/23/2012  at 1429 and received telephone approval for admission today.  Admission Coordinator:  Clois Dupes, time 2130 Date 03/23/2012.

## 2012-03-24 ENCOUNTER — Inpatient Hospital Stay (HOSPITAL_COMMUNITY): Payer: Medicare Other

## 2012-03-24 ENCOUNTER — Inpatient Hospital Stay (HOSPITAL_COMMUNITY): Payer: Medicare Other | Admitting: Speech Pathology

## 2012-03-24 DIAGNOSIS — I6340 Cerebral infarction due to embolism of unspecified cerebral artery: Secondary | ICD-10-CM

## 2012-03-24 DIAGNOSIS — G8110 Spastic hemiplegia affecting unspecified side: Secondary | ICD-10-CM

## 2012-03-24 DIAGNOSIS — I69922 Dysarthria following unspecified cerebrovascular disease: Secondary | ICD-10-CM

## 2012-03-24 LAB — COMPREHENSIVE METABOLIC PANEL
ALT: 35 U/L (ref 0–53)
AST: 19 U/L (ref 0–37)
Albumin: 3.1 g/dL — ABNORMAL LOW (ref 3.5–5.2)
Calcium: 8.7 mg/dL (ref 8.4–10.5)
Potassium: 3.4 mEq/L — ABNORMAL LOW (ref 3.5–5.1)
Sodium: 141 mEq/L (ref 135–145)
Total Protein: 7.2 g/dL (ref 6.0–8.3)

## 2012-03-24 LAB — GLUCOSE, CAPILLARY: Glucose-Capillary: 111 mg/dL — ABNORMAL HIGH (ref 70–99)

## 2012-03-24 LAB — CBC WITH DIFFERENTIAL/PLATELET
Basophils Absolute: 0.1 10*3/uL (ref 0.0–0.1)
Eosinophils Absolute: 0.5 10*3/uL (ref 0.0–0.7)
Eosinophils Relative: 4 % (ref 0–5)
MCH: 30.6 pg (ref 26.0–34.0)
MCV: 87.2 fL (ref 78.0–100.0)
Neutrophils Relative %: 67 % (ref 43–77)
Platelets: 212 10*3/uL (ref 150–400)
RDW: 13.7 % (ref 11.5–15.5)
WBC: 12.6 10*3/uL — ABNORMAL HIGH (ref 4.0–10.5)

## 2012-03-24 LAB — CULTURE, BLOOD (ROUTINE X 2): Culture: NO GROWTH

## 2012-03-24 MED ORDER — POTASSIUM CHLORIDE CRYS ER 10 MEQ PO TBCR
10.0000 meq | EXTENDED_RELEASE_TABLET | Freq: Two times a day (BID) | ORAL | Status: DC
  Administered 2012-03-24 – 2012-04-20 (×55): 10 meq via ORAL
  Filled 2012-03-24 (×60): qty 1

## 2012-03-24 NOTE — H&P (Addendum)
Physical Medicine and Rehabilitation Admission H&P    CC: Left sided weakness with numbness, slurred speech, left visual field deficits  HPI:  Shaun Pugh is a 61 y.o. RH-male with history of CAD, TIAs with left visual field deficits as well as dizziness/HA. Admitted on 03/17/12 with HA, left sided weakness and numbness with slurred speech. CT head negative and treated with tPA. CTA head/neck with hypodensity right superior gyrus, ulcerated plaque right ICA with 60% ICA stenosis, suspicion for 10mm focal thrombus proximal aortic arch. Patient developed combativeness with agitation as well as focal motor seizures LUE on 03/18/12. He was treated with ativan and started on Keppra. F/u CCT with extensive area of low density affecting cortical and subcortical right hemisphere in MCA distribution. 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 Placed on ASA and no anticoagulation due to large stroke and risk of bleeding. EEG without epileptiform activity. He did develop fevers and was started on antibiotics for LLL infiltrate. Carotid dopplers done revealing 60-79% ICA stenosis. 2D echo with EF 65-70% with moderate LVH. Mentation is improving with improvement in left inattention and improved ability to follow one step commands. He continues with severe deficits in safety awareness, basic problem solving, sequencing and attention. D3 diet with thin liquids initiated. He has had some problems with agitation last night likely due to delirium. Therapy team working on pre gait activity and recommends CIR for progression   Review of Systems  HENT: Negative for hearing loss.   Eyes: Negative for blurred vision and double vision.  Respiratory: Negative for cough and sputum production.        Tickling in throat causing coughing.. "cold" symptoms  Cardiovascular: Negative for chest pain and palpitations.  Gastrointestinal: Negative for heartburn and nausea.    Musculoskeletal:       Left foot pain recently.  Neurological: Positive for speech change and focal weakness. Negative for headaches.  Psychiatric/Behavioral: The patient has insomnia.    Past Medical History  Diagnosis Date  . High cholesterol   . Atrophy of right kidney 09/15/2011  . Kidney calculi 09/15/2011  . Acute pancreatitis 09/14/2011  . Cholelithiasis 09/14/2011  . Chronic back pain   . Sleep apnea     STOP BANG 5  . TIA (transient ischemic attack)   . CAD (coronary artery disease)   . Stented coronary artery approx 2004   Past Surgical History  Procedure Date  . Back surgery     x 4   . Cholecystectomy 09/25/2011    Procedure: LAPAROSCOPIC CHOLECYSTECTOMY;  Surgeon: Fabio Bering, MD;  Location: [facility];  Service: General;  Laterality: N/A;   Family History  Problem Relation Age of Onset  . Anesthesia problems Neg Hx   . Hypotension Neg Hx   . Malignant hyperthermia Neg Hx   . Pseudochol deficiency Neg Hx   . Congestive Heart Failure Mother   . Diabetes Father   . Heart attack Father    Social History: Married. 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 Disabled since 1995 due to multiple back surgeries. He reports that he has been smoking 1 PPD. He does not have any smokeless tobacco history on file. He reports that he does not drink alcohol or use illicit drugs. Wife works part time. They [place]   Allergies: No Known Allergies  Scheduled Meds:    . aspirin EC  325 mg Oral Daily  .  atorvastatin  20 mg Oral q1800  . clopidogrel  75 mg Oral Q breakfast  . dextromethorphan-guaiFENesin  1 tablet Oral BID  . enoxaparin  40 mg Subcutaneous Q24H  . fluticasone  1 spray Each Nare Daily  . levetiracetam  1,250 mg Intravenous Q12H  . pantoprazole  40 mg Oral Daily  . potassium chloride  10 mEq Oral BID  . traZODone  50 mg Oral QHS   Continuous Infusions:    Medications Prior to Admission  Medication Sig Dispense Refill   . rosuvastatin (CRESTOR) 10 MG tablet Take 10 mg by mouth daily.      . valsartan-hydrochlorothiazide (DIOVAN-HCT) 160-12.5 MG per tablet Take 1 tablet by mouth daily.        Home:     Functional History:    Functional Status:  Mobility:          ADL:    Cognition: Cognition Orientation Level: Oriented X4;Other (comment)     Blood pressure 138/87, pulse 91, temperature 98.3 F (36.8 C), temperature source Oral, resp. rate 19, weight 116.9 kg (257 lb 11.5 oz), SpO2 94.00%. Physical Exam  Nursing note and vitals reviewed. Constitutional: He appears well-developed and well-nourished.  HENT:  Head: Normocephalic and atraumatic.  Eyes: Pupils are equal, round, and reactive to light.  Neck: Normal range of motion. Neck supple.  Cardiovascular: Normal rate and regular rhythm.   Pulmonary/Chest: Effort normal and breath sounds normal.  Abdominal: Soft. Bowel sounds are normal.  Musculoskeletal: He exhibits no edema.  Neurological: He is alert.       Oriented to self and situation. Place perseverates on "[place]". Able to follow basic commands. Dense left hemiparesis. Mild left inattention. Left field cut--wife reports minimal vision limited to  LUQ in left eye.   Skin: Skin is warm and dry.  0/5 on L side 5/5 on R side Mildly reduced sensation on L side decreased tone on Left side  Results for orders placed during the hospital encounter of 03/23/12 (from the past 48 hour(s))  CBC WITH DIFFERENTIAL     Status: Abnormal   Collection Time   03/24/12  6:15 AM      Component Value Range Comment   WBC 12.6 (*) 4.0 - 10.5 K/uL    RBC 4.22  4.22 - 5.81 MIL/uL    Hemoglobin 12.9 (*) 13.0 - 17.0 g/dL    HCT 16.1 (*) 09.6 - 52.0 %    MCV 87.2  78.0 - 100.0 fL    MCH 30.6  26.0 - 34.0 pg    MCHC 35.1  30.0 - 36.0 g/dL    RDW 04.5  40.9 - 81.1 %    Platelets 212  150 - 400 K/uL    Neutrophils Relative 67  43 - 77 %    Neutro Abs 8.4 (*) 1.7 - 7.7 K/uL    Lymphocytes  Relative 19  12 - 46 %    Lymphs Abs 2.4  0.7 - 4.0 K/uL    Monocytes Relative 11  3 - 12 %    Monocytes Absolute 1.3 (*) 0.1 - 1.0 K/uL    Eosinophils Relative 4  0 - 5 %    Eosinophils Absolute 0.5  0.0 - 0.7 K/uL    Basophils Relative 0  0 - 1 %    Basophils Absolute 0.1  0.0 - 0.1 K/uL   COMPREHENSIVE METABOLIC PANEL     Status: Abnormal   Collection Time   03/24/12  6:15 AM  Component Value Range Comment   Sodium 141  135 - 145 mEq/L    Potassium 3.4 (*) 3.5 - 5.1 mEq/L    Chloride 108  96 - 112 mEq/L    CO2 21  19 - 32 mEq/L    Glucose, Bld 105 (*) 70 - 99 mg/dL    BUN 17  6 - 23 mg/dL    Creatinine, Ser 6.21  0.50 - 1.35 mg/dL    Calcium 8.7  8.4 - 30.8 mg/dL    Total Protein 7.2  6.0 - 8.3 g/dL    Albumin 3.1 (*) 3.5 - 5.2 g/dL    AST 19  0 - 37 U/L    ALT 35  0 - 53 U/L    Alkaline Phosphatase 61  39 - 117 U/L    Total Bilirubin 0.6  0.3 - 1.2 mg/dL    GFR calc non Af Amer >90  >90 mL/min    GFR calc Af Amer >90  >90 mL/min    Dg Chest 2 View  03/23/2012  *RADIOLOGY REPORT*  Clinical Data: Left lower lobe infiltrate.  Cough  CHEST - 2 VIEW  Comparison: 03/20/2012  Findings: Normal heart size.  No pleural effusion or edema.  Left lower lobe opacity is stable to improved in the interval.  No new findings.  IMPRESSION:  1.  Stable to improved left lower lobe pneumonia.   Original Report Authenticated By: Signa Kell, M.D.     Post Admission Physician Evaluation: 1. Functional deficits secondary  to R MCA infarct, embolic from aortic arch. 2. Patient is admitted to receive collaborative, interdisciplinary care between the physiatrist, rehab nursing staff, and therapy team. 3. Patient's level of medical complexity and substantial therapy needs in context of that medical necessity cannot be provided at a lesser intensity of care such as a SNF. 4. Patient has experienced substantial functional loss from his/her baseline which was documented above under the "Functional  History" and "Functional Status" headings.  Judging by the patient's diagnosis, physical exam, and functional history, the patient has potential for functional progress which will result in measurable gains while on inpatient rehab.  These gains will be of substantial and practical use upon discharge  in facilitating mobility and self-care at the household level. 5. Physiatrist will provide 24 hour management of medical needs as well as oversight of the therapy plan/treatment and provide guidance as appropriate regarding the interaction of the two. 6. 24 hour rehab nursing will assist with bladder management, bowel management, safety, skin/wound care, disease management, medication administration, pain management and patient education  and help integrate therapy concepts, techniques,education, etc. 7. PT will assess and treat for:  Pre gait, gait, safety, NM re education equip.  Goals are: Min A mob. 8. OT will assess and treat for: ADL,Cog/percept, NM re ed, safety equip.   Goals are: Min A ADLs. 9. SLP will assess and treat for: assess cognition, dysarthria.  Goals are: improve speech intell. 10. Case Management and Social Worker will assess and treat for psychological issues and discharge planning. 11. Team conference will be held weekly to assess progress toward goals and to determine barriers to discharge. 12. Patient will receive at least 3 hours of therapy per day at least 5 days per week. 13. ELOS: 4 wks      Prognosis:  good   Medical Problem List and Plan: 1. DVT Prophylaxis/Anticoagulation: Pharmaceutical: Lovenox 2. Pain Management: Was on started on NSAIDS PTA due to left foot pain. Will monitor  for symptoms with increase in activity levels. Continue prn tylenol for now.  3. Mood: patient with poor awareness of deficits. Will monitor for now. LCSW to follow for formal evaluation.  4. Neuropsych: This patient is not capable of making decisions on his/her own behalf. 5. LLL PNA: Treated  with short course antibiotics.  6. HTN: monitor with bid checks.  Off antihypertensives at current time. 7. Focal motor seizures: continue keppra 1250 mg bid. Change to 8. Insomnia/Hallucinations:  Will discontinue seroquel as causing fatigue/daytime sedation. Will schedule trazodone at bedtime. 9. Symptomatic R-ICA stenosis: continue ASA and plavix. Neurology indicates for surgery past discharge? Pt seen on 03/23/2012 but documentation completed 03/24/2012  03/24/2012, 7:55 AM

## 2012-03-24 NOTE — Progress Notes (Signed)
Occupational Therapy Session Note  Patient Details  Name: Shaun Pugh MRN: [id number] Date of Birth: [birth year]  Today's Date: 03/24/2012 Time: 1310-1340 Time Calculation (min): 30 min   Skilled Therapeutic Interventions/Progress Updates:    1:1 focus on bed mobility (supine to sit) with max A, sitting balance at EOB with min to mod A with mod - max cuing for body awareness in relation to midline. Pt with significant inattention to left body and environment: focus on finding and identifying items in left field and attention to left UE. Max A +2 to scoot up in bed and then max to perform sit to supine.   Therapy Documentation Precautions:  Precautions Precautions: Fall Precaution Comments: Left sided weakness and lack of attention  Restrictions Weight Bearing Restrictions: No Pain: Pain Assessment Pain Assessment: No/denies pain  See FIM for current functional status  Therapy/Group: Individual Therapy  Roney Mans [facility] 03/24/2012, 2:18 PM

## 2012-03-24 NOTE — Evaluation (Signed)
The assessment and plan has been reviewed and SLP is in agreement. Jonnae Fonseca, M.A., CCC-SLP [phone]  

## 2012-03-24 NOTE — Evaluation (Signed)
Occupational Therapy Assessment and Plan  Patient Details  Name: Shaun Pugh MRN: [id number] Date of Birth: 12/28/1950  OT Diagnosis: cognitive deficits, hemiplegia affecting non-dominant side and muscle weakness (generalized) Rehab Potential: Rehab Potential: Good ELOS: 2.5-3 weeks   Today's Date: 03/24/2012 Time: 5621-3086 Time Calculation (min): 45 min  Problem List:  Patient Active Problem List  Diagnosis  . Acute pancreatitis  . Cholelithiasis  . Hyperglycemia  . Current every day smoker  . Atrophy of right kidney  . Kidney calculi  . Hemiplegia, unspecified, affecting nondominant side  . Stented coronary artery  . Stroke, acute, embolic, right  . Seizure  . Cytotoxic cerebral edema  . Aortic thromboembolism    Past Medical History:  Past Medical History  Diagnosis Date  . High cholesterol   . Atrophy of right kidney 09/15/2011  . Kidney calculi 09/15/2011  . Acute pancreatitis 09/14/2011  . Cholelithiasis 09/14/2011  . Chronic back pain   . Sleep apnea     STOP BANG 5  . TIA (transient ischemic attack)   . CAD (coronary artery disease)   . Stented coronary artery approx 2004   Past Surgical History:  Past Surgical History  Procedure Date  . Back surgery     x 4   . Cholecystectomy 09/25/2011    Procedure: LAPAROSCOPIC CHOLECYSTECTOMY;  Surgeon: Fabio Bering, MD;  Location: [facility];  Service: General;  Laterality: N/A;    Assessment & Plan Clinical Impression: Patient is a 61 y.o. year old male with recent admission to the hospital with history of CAD, TIAs with left visual field deficits as well as dizziness/HA. Admitted on 03/17/12 with HA, left sided weakness and numbness with slurred speech. CT head negative and treated with tPA. CTA head/neck with hypodensity right superior gyrus, ulcerated plaque right ICA with 60% ICA stenosis, suspicion for 10mm focal thrombus proximal aortic arch. Patient developed combativeness with agitation as well as focal motor  seizures LUE on 03/18/12. He was treated with ativan and started on Keppra. F/u CCT with extensive area of low density affecting cortical and subcortical right hemisphere in MCA distribution. 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 Placed on ASA and no anticoagulation due to large stroke and risk of bleeding. EEG without epileptiform activity. He did develop fevers and was started on antibiotics for LLL infiltrate. Carotid dopplers done revealing 60-79% ICA stenosis. 2D echo with EF 65-70% with moderate LVH. Mentation is improving with improvement in left inattention and improved ability to follow one step commands. He continues with severe deficits in safety awareness, basic problem solving, sequencing and attention. D3 diet with thin liquids initiated. Patient transferred to CIR on 03/23/2012 .    Patient currently requires Max - Total Assist with basic self-care skills secondary to muscle weakness, decreased coordination and decreased motor planning, decreased visual perceptual skills and field cut, left side neglect and decreased initiation, decreased attention, decreased awareness, decreased problem solving, decreased safety awareness and decreased memory.  Prior to hospitalization, patient could complete BADL with independence.  Patient will benefit from skilled intervention to decrease level of assist with basic self-care skills prior to discharge home with care partner.  Anticipate patient will require 24 hour supervision and follow up home health.  OT - End of Session Endurance Deficit: Yes OT Assessment Rehab Potential: Good Barriers to Discharge: Inaccessible home environment Barriers to Discharge Comments: Currently living in camper. OT Plan OT Frequency: 1-2 X/day, 60-90 minutes Estimated Length  of Stay: 2.5-3 weeks OT Treatment/Interventions: Social worker;Functional electrical  stimulation;Cognitive remediation/compensation;Community reintegration;Functional mobility training;Discharge planning;Neuromuscular re-education;Therapeutic Activities;Visual/perceptual remediation/compensation;UE/LE Coordination activities;Self Care/advanced ADL retraining;UE/LE Strength taining/ROM;Therapeutic Exercise;Patient/family education OT Recommendation Follow Up Recommendations: Home health OT Equipment Recommended: Other (comment) (TBD)  OT Evaluation Precautions/Restrictions  Precautions Precautions: Fall Precaution Comments: Left sided weakness and lack of attention  Restrictions Weight Bearing Restrictions: No General Amount of Missed OT Time (min): 15 Minutes Pain Pain Assessment Pain Assessment: No/denies pain Home Living/Prior Functioning Home Living Lives With: Spouse Available Help at Discharge: Available 24 hours/day Type of Home: Other (Comment) (Park Geophysicist/field seismologist) Home Adaptive Equipment: None Additional Comments: Patien and wife have lived in [place] for 7 years. Unsure where they will be moving to once patient is discharged. Will not be able to return to camper. Prior Function Level of Independence: Independent with basic ADLs;Independent with gait Able to Take Stairs?: Yes Driving: Yes Vocation: Retired Comments: part time at [employer]. 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 Vision/Perception  Vision - History Baseline Vision: No visual deficits Patient Visual Report: Peripheral vision impairment Vision - Assessment Eye Alignment:  (unable to test due to cognition) Additional Comments: Vision needs to be further tested. Unable to test completely due lack of time. Perception Perception: Not tested Praxis Praxis: Not tested  Cognition Overall Cognitive Status: Impaired Arousal/Alertness: Awake/alert Orientation Level: Oriented X4 Focused Attention: Impaired Memory: Impaired Awareness: Impaired Problem Solving:  Impaired Reasoning: Impaired Initiating: Impaired Self Monitoring: Impaired Safety/Judgment: Impaired Sensation Sensation Light Touch: Appears Intact Stereognosis: Impaired Detail Stereognosis Impaired Details: Impaired LUE Hot/Cold: Not tested Proprioception: Impaired Detail Proprioception Impaired Details: Impaired LUE Coordination Gross Motor Movements are Fluid and Coordinated: No Fine Motor Movements are Fluid and Coordinated: No Coordination and Movement Description: Abcent LUE Finger Nose Finger Test: abcent LUE Heel Shin Test: normal R; unable L Motor  Motor Motor: Hemiplegia Mobility  Bed Mobility Rolling Right: 1: +2 Total assist Rolling Right: Patient Percentage: 0% Rolling Right Details: Tactile cues for placement;Verbal cues for technique Rolling Left: 1: +1 Total assist Rolling Left: Patient Percentage: 20% Rolling Left Details: Tactile cues for placement;Verbal cues for technique Right Sidelying to Sit: 1: +2 Total assist Right Sidelying to Sit: Patient Percentage: 10% Right Sidelying to Sit Details: Verbal cues for technique;Tactile cues for placement Sitting - Scoot to Edge of Bed: 1: +1 Total assist Sitting - Scoot to Edge of Bed: Patient Percentage: 0% Transfers Sit to Stand: 1: +2 Total assist;From bed Sit to Stand Details: Verbal cues for technique;Verbal cues for sequencing;Visual cues/gestures for sequencing Stand to Sit: 1: +2 Total assist;To chair/3-in-1 Stand to Sit Details (indicate cue type and reason): Visual cues/gestures for sequencing;Verbal cues for technique;Verbal cues for sequencing  Extremity/Trunk Assessment RUE Assessment RUE Assessment: Within Functional Limits (MMT: 4/5) LUE Assessment LUE Assessment: Exceptions to [facility] (Pain during shoulder flexion) LUE PROM (degrees) Left Shoulder Flexion:  (less than 90 degrees due to pain) LUE Strength Left Shoulder Flexion: 0/5 Left Shoulder Extension: 0/5 Left Shoulder Horizontal  ABduction: 0/5 Left Shoulder Horizontal ADduction: 0/5 Left Wrist Flexion: 0/5 Left Wrist Extension: 0/5 Gross Grasp: Impaired  See FIM for current functional status Refer to Care Plan for Long Term Goals  Recommendations for other services: Neuropsych  Discharge Criteria: Patient will be discharged from OT if patient refuses treatment 3 consecutive times without medical reason, if treatment goals not met, if there is a change in medical status, if patient makes no progress towards goals or if patient  is discharged from hospital.  The above assessment, treatment plan, treatment alternatives and goals were discussed and mutually agreed upon: by patient and by family  OT eval completed this date. Patient presents with left side neglect (visual and physical). Need to further test vision. Unable to do so due to time constraint. Patient presents with heavy left lean during sitting on edge of bed. Once standing patient pushed heavily to left side. Educated wife on left neglect and the importance of sitting on left side as much as possible to increase awareness.  Limmie Patricia, OTR/L 03/24/2012, 9:33 AM

## 2012-03-24 NOTE — Progress Notes (Signed)
Patient information reviewed and entered into eRehab system by Lacharles Altschuler, RN, CRRN, PPS Coordinator.  Information including medical coding and functional independence measure will be reviewed and updated through discharge.     Per nursing patient was given "Data Collection Information Summary for Patients in Inpatient Rehabilitation Facilities with attached "Privacy Act Statement-Health Care Records" upon admission.  

## 2012-03-24 NOTE — Care Management Note (Signed)
Inpatient Rehabilitation Center Individual Statement of Services  Patient Name:  Shaun Pugh  Date:  03/24/2012  Welcome to the Inpatient Rehabilitation Center.  Our goal is to provide you with an individualized program based on your diagnosis and situation, designed to meet your specific needs.  With this comprehensive rehabilitation program, you will be expected to participate in at least 3 hours of rehabilitation therapies Monday-Friday, with modified therapy programming on the weekends.  Your rehabilitation program will include the following services:  Physical Therapy (PT), Occupational Therapy (OT), Speech Therapy (ST), 24 hour per day rehabilitation nursing, Therapeutic Recreaction (TR), Neuropsychology, Case Management (RN and Social Worker), Rehabilitation Medicine, Nutrition Services and Pharmacy Services  Weekly team conferences will be held on Wednesday to discuss your progress.  Your RN Case Designer, television/film set will talk with you frequently to get your input and to update you on team discussions.  Team conferences with you and your family in attendance may also be held.  Expected length of stay: 2.5-3 weeks  Overall anticipated outcome: min level  Depending on your progress and recovery, your program may change.  Your RN Case Estate agent will coordinate services and will keep you informed of any changes.  Your RN Sports coach and SW names and contact numbers are listed  below.  The following services may also be recommended but are not provided by the Inpatient Rehabilitation Center:   Driving Evaluations  Home Health Rehabiltiation Services  Outpatient Rehabilitatation [facility]  Vocational Rehabilitation   Arrangements will be made to provide these services after discharge if needed.  Arrangements include referral to agencies that provide these services.  Your insurance has been verified to be:  [employer] Your primary doctor is:  Dr [name]  Pertinent information will be shared with your doctor and your insurance company.  Social Worker:  Dossie Der, [place] [phone]  Information discussed with and copy given to patient by: Lucy Chris, 03/24/2012, 9:02 AM

## 2012-03-24 NOTE — Progress Notes (Signed)
Patient ID: Shaun Pugh, male   DOB: [birth year], 61 y.o.   MRN: [id number] Shaun Pugh is a 61 y.o. RH-male with history of CAD, TIAs with left visual field deficits as well as dizziness/HA. Admitted on 03/17/12 with HA, left sided weakness and numbness with slurred speech. CT head negative and treated with tPA. CTA head/neck with hypodensity right superior gyrus, ulcerated plaque right ICA with 60% ICA stenosis, suspicion for 10mm focal thrombus proximal aortic arch. Patient developed combativeness with agitation as well as focal motor seizures LUE on 03/18/12. He was treated with ativan and started on Keppra. F/u CCT with extensive area of low density affecting cortical and subcortical right hemisphere in MCA distribution. 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 Placed on ASA and no anticoagulation due to large stroke and risk of bleeding. EEG without epileptiform activity. He did develop fevers and was started on antibiotics for LLL infiltrate. Carotid dopplers done revealing 60-79% ICA stenosis. 2D echo with EF 65-70%   Subjective/Complaints: Difficulty finding a comfortable position  Review of Systems  Constitutional: Positive for malaise/fatigue.  Respiratory: Negative for cough.   Cardiovascular: Negative for chest pain.  Neurological: Positive for sensory change, speech change and focal weakness. Negative for seizures.  All other systems reviewed and are negative.    Objective: Vital Signs: Blood pressure 138/87, pulse 91, temperature 98.3 F (36.8 C), temperature source Oral, resp. rate 19, weight 116.9 kg (257 lb 11.5 oz), SpO2 94.00%. Dg Chest 2 View  03/23/2012  *RADIOLOGY REPORT*  Clinical Data: Left lower lobe infiltrate.  Cough  CHEST - 2 VIEW  Comparison: 03/20/2012  Findings: Normal heart size.  No pleural effusion or edema.  Left lower lobe opacity is stable to improved in the interval.  No new findings.  IMPRESSION:   1.  Stable to improved left lower lobe pneumonia.   Original Report Authenticated By: Signa Kell, M.D.    Results for orders placed during the hospital encounter of 03/23/12 (from the past 72 hour(s))  CBC WITH DIFFERENTIAL     Status: Abnormal   Collection Time   03/24/12  6:15 AM      Component Value Range Comment   WBC 12.6 (*) 4.0 - 10.5 K/uL    RBC 4.22  4.22 - 5.81 MIL/uL    Hemoglobin 12.9 (*) 13.0 - 17.0 g/dL    HCT 65.7 (*) 84.6 - 52.0 %    MCV 87.2  78.0 - 100.0 fL    MCH 30.6  26.0 - 34.0 pg    MCHC 35.1  30.0 - 36.0 g/dL    RDW 96.2  95.2 - 84.1 %    Platelets 212  150 - 400 K/uL    Neutrophils Relative 67  43 - 77 %    Neutro Abs 8.4 (*) 1.7 - 7.7 K/uL    Lymphocytes Relative 19  12 - 46 %    Lymphs Abs 2.4  0.7 - 4.0 K/uL    Monocytes Relative 11  3 - 12 %    Monocytes Absolute 1.3 (*) 0.1 - 1.0 K/uL    Eosinophils Relative 4  0 - 5 %    Eosinophils Absolute 0.5  0.0 - 0.7 K/uL    Basophils Relative 0  0 - 1 %    Basophils Absolute 0.1  0.0 - 0.1 K/uL   COMPREHENSIVE METABOLIC PANEL     Status: Abnormal   Collection Time   03/24/12  6:15  AM      Component Value Range Comment   Sodium 141  135 - 145 mEq/L    Potassium 3.4 (*) 3.5 - 5.1 mEq/L    Chloride 108  96 - 112 mEq/L    CO2 21  19 - 32 mEq/L    Glucose, Bld 105 (*) 70 - 99 mg/dL    BUN 17  6 - 23 mg/dL    Creatinine, Ser 1.61  0.50 - 1.35 mg/dL    Calcium 8.7  8.4 - 09.6 mg/dL    Total Protein 7.2  6.0 - 8.3 g/dL    Albumin 3.1 (*) 3.5 - 5.2 g/dL    AST 19  0 - 37 U/L    ALT 35  0 - 53 U/L    Alkaline Phosphatase 61  39 - 117 U/L    Total Bilirubin 0.6  0.3 - 1.2 mg/dL    GFR calc non Af Amer >90  >90 mL/min    GFR calc Af Amer >90  >90 mL/min      Nursing note and vitals reviewed.  Constitutional: He appears well-developed and well-nourished.  HENT:  Head: Normocephalic and atraumatic.  Eyes: Pupils are equal, round, and reactive to light.  Neck: Normal range of motion. Neck supple.    Cardiovascular: Normal rate and regular rhythm.  Pulmonary/Chest: Effort normal and breath sounds normal.  Abdominal: Soft. Bowel sounds are normal.  Musculoskeletal: He exhibits no edema.  Neurological: He is alert.  Oriented to self and situation. Place perseverates on "[place]". Able to follow basic commands. Dense left hemiparesis. Mild left inattention. Left field cut--wife reports minimal vision limited to LUQ in left eye.  Skin: Skin is warm and dry.     Assessment/Plan: 1. Functional deficits secondary to Large R MCA infarct with L HP, Lhemisensory def, cognitive deficits which require 3+ hours per day of interdisciplinary therapy in a comprehensive inpatient rehab setting. Physiatrist is providing close team supervision and 24 hour management of active medical problems listed below. Physiatrist and rehab team continue to assess barriers to discharge/monitor patient progress toward functional and medical goals. FIM:                   Comprehension Comprehension Mode: Auditory Comprehension: 4-Understands basic 75 - 89% of the time/requires cueing 10 - 24% of the time  Expression Expression Mode: Verbal Expression: 4-Expresses basic 75 - 89% of the time/requires cueing 10 - 24% of the time. Needs helper to occlude trach/needs to repeat words.  Social Interaction Social Interaction: 4-Interacts appropriately 75 - 89% of the time - Needs redirection for appropriate language or to initiate interaction.  Problem Solving Problem Solving: 4-Solves basic 75 - 89% of the time/requires cueing 10 - 24% of the time  Memory Memory: 4-Recognizes or recalls 75 - 89% of the time/requires cueing 10 - 24% of the time   Medical Problem List and Plan:  1. DVT Prophylaxis/Anticoagulation: Pharmaceutical: Lovenox  2. Pain Management: Was on started on NSAIDS PTA due to left foot pain. Will monitor for symptoms with increase in activity levels. Continue prn tylenol for now.  3.  Mood: patient with poor awareness of deficits. Will monitor for now. LCSW to follow for formal evaluation.  4. Neuropsych: This patient is not capable of making decisions on his/her own behalf.  5. LLL PNA: Treated with short course antibiotics. Residual mild leukocytosis 6. HTN: monitor with bid checks. Off antihypertensives at current time.  7. Focal motor seizures: continue keppra  1250 mg bid. Change to  8. Insomnia/Hallucinations: Will discontinue seroquel as causing fatigue/daytime sedation. Will schedule trazodone at bedtime.  9. Symptomatic R-ICA stenosis: continue ASA and plavix. Neurology indicates for surgery past discharge?  LOS (Days) 1 A FACE TO FACE EVALUATION WAS PERFORMED  KIRSTEINS,ANDREW E 03/24/2012, 7:39 AM

## 2012-03-24 NOTE — Progress Notes (Signed)
Chaplain visited with pt and pt's wife while rounding in 4N. Pt expecting to move to 4000 within the next hour for several days of physical therapy. Pt feels good about the care he has received here. He is optimistic that he will be able to regain functionality on left side of his body. Pt and wife expressed appreciation for chaplain visit.

## 2012-03-24 NOTE — Evaluation (Signed)
Speech Language Pathology Assessment and Plan  Patient Details  Name: Shaun Pugh MRN: [id number] Date of Birth: [birth year]  SLP Diagnosis: Cognitive Impairments;Dysphagia  Rehab Potential: Good ELOS: 2.5-3 weeks   Today's Date: 03/24/2012 Time: 1191-4782 Time Calculation (min): 55 min  Problem List:  Patient Active Problem List  Diagnosis  . Acute pancreatitis  . Cholelithiasis  . Hyperglycemia  . Current every day smoker  . Atrophy of right kidney  . Kidney calculi  . Hemiplegia, unspecified, affecting nondominant side  . Stented coronary artery  . Stroke, acute, embolic, right  . Seizure  . Cytotoxic cerebral edema  . Aortic thromboembolism   Past Medical History:  Past Medical History  Diagnosis Date  . High cholesterol   . Atrophy of right kidney 09/15/2011  . Kidney calculi 09/15/2011  . Acute pancreatitis 09/14/2011  . Cholelithiasis 09/14/2011  . Chronic back pain   . Sleep apnea     STOP BANG 5  . TIA (transient ischemic attack)   . CAD (coronary artery disease)   . Stented coronary artery approx 2004   Past Surgical History:  Past Surgical History  Procedure Date  . Back surgery     x 4   . Cholecystectomy 09/25/2011    Procedure: LAPAROSCOPIC CHOLECYSTECTOMY;  Surgeon: Fabio Bering, MD;  Location: [facility];  Service: General;  Laterality: N/A;    Assessment / Plan / Recommendation Clinical Impression  [name] is a 61 y.o. male with history of CAD, TIAs with left visual field deficits as well as dizziness/HA. Admitted on 03/17/12 with HA, left sided weakness and numbness with slurred speech. CT head negative and treated with tPA. CTA head/neck with hypodensity right superior gyrus, ulcerated plaque right ICA with 60% ICA stenosis, suspicion for 10mm focal thrombus proximal aortic arch. Patient developed combativeness with agitation as well as focal motor seizures LUE on 03/18/12.  F/u CCT with extensive area of low density affecting cortical and  subcortical right hemisphere in MCA distribution. Neurology felt that patient with deterioration due to new thromboembolism from aortic arch thrombus. Mentation is improving with improvement in left inattention and improved ability to follow one step commands. He continues with severe deficits in safety awareness, basic problem solving, sequencing and attention. D3 diet with thin liquids initiated. He has had some problems with agitation last night likely due to delirium. Therapy team working on pre gait activity and recommends CIR for progression.  Patient admitted to [facility] 03/23/12.  SLP evaluation revealed moderate cognitive impairments and mild oral dysphagia.   Patient's diet upgraded to regular textures, thin liquids with the use of compensatory strategies for small bites and sips, and slow rate per results of BSE.  Patient exhibits slightly prolonged mastication on regular textures and left sided buccal pocketing, which patient self-cued to clear with a lingual sweep and liquid wash.    Patient exhibited difficulty following multi-step directions during functional tasks; as a result, SLP facilitated session with mod assist multimodal cueing to follow multi-step basic commands during w/c to bed transfer.  Patient also presents with intellectual and emergent awareness impairment.  Patient could not identify why he was wearing a quick release belt, but could identify other safety precautions (e.g. don't get up without help) with min assist question cues.  Overall, patient required mod assist verbal cues for recall of daily, new information.  Patient continues to exhibit left inattention; as a result, patient requires max to total assist to completely scan to the left of  complete a reading passage.  Overall, patient will benefit from skilled SLP services for cognition and dysphagia management during CIR stay to maximize functional independence and reduce burden of care upon discharge.       SLP Assessment   Patient will need skilled Speech Lanaguage Pathology Services during CIR admission    Recommendations  Follow up Recommendations: 24 hour supervision/assistance;Home Health SLP Equipment Recommended: None recommended by SLP    SLP Frequency 1-2 X/day, 30-60 minutes;5 out of 7 days   SLP Treatment/Interventions Cognitive remediation/compensation;Cueing hierarchy;Dysphagia/aspiration precaution training;Environmental controls;Functional tasks;Internal/external aids;Patient/family education;Speech/Language facilitation;Therapeutic Activities    Pain Pain Assessment Pain Assessment: No/denies pain Pain Score: 0-No pain Prior Functioning Cognitive/Linguistic Baseline: Within functional limits 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 SLP Short Term Goal 2 (Week 1): Patient will solve basic problems during functional tasks with mod A visual and verbal cueing. SLP Short Term Goal 3 (Week 1): Patient will attend to the left sided during basic functional tasks with mod A verbal cuing.   SLP Short Term Goal 4 (Week 1): Patient will demonstrate emergent awareness by asking for help when needed with max A verbal cueing.   SLP Short Term Goal 5 (Week 1): Patient will alternate attention between the clinician and a basic functional task with mod A verbal and visual cues for redirection. SLP Short Term Goal 6 (Week 1): Patient will utilize external aids to facilitate recall of new, daily information with max A multimodal cueing.    See FIM for current functional status Refer to Care Plan for Long Term Goals  Recommendations for other services: None  Discharge Criteria: Patient will be discharged from SLP if patient  refuses treatment 3 consecutive times without medical reason, if treatment goals not met, if there is a change in medical status, if patient makes no progress towards goals or if patient is discharged from hospital.  The above assessment, treatment plan, treatment alternatives and goals were discussed and mutually agreed upon: by patient and by family  Ace Gins  Graduate Clinician Speech Language Pathology  Rhyland Hinderliter, Joni Reining 03/24/2012, 12:30 PM

## 2012-03-24 NOTE — Progress Notes (Signed)
Social Work Assessment and Plan Social Work Assessment and Plan  Patient Details  Name: Shaun Pugh MRN: [id number] Date of Birth: 05-06-1951  Today's Date: 03/24/2012  Problem List:  Patient Active Problem List  Diagnosis  . Acute pancreatitis  . Cholelithiasis  . Hyperglycemia  . Current every day smoker  . Atrophy of right kidney  . Kidney calculi  . Hemiplegia, unspecified, affecting nondominant side  . Stented coronary artery  . Stroke, acute, embolic, right  . Seizure  . Cytotoxic cerebral edema  . Aortic thromboembolism   Past Medical History:  Past Medical History  Diagnosis Date  . High cholesterol   . Atrophy of right kidney 09/15/2011  . Kidney calculi 09/15/2011  . Acute pancreatitis 09/14/2011  . Cholelithiasis 09/14/2011  . Chronic back pain   . Sleep apnea     STOP BANG 5  . TIA (transient ischemic attack)   . CAD (coronary artery disease)   . Stented coronary artery approx 2004   Past Surgical History:  Past Surgical History  Procedure Date  . Back surgery     x 4   . Cholecystectomy 09/25/2011    Procedure: LAPAROSCOPIC CHOLECYSTECTOMY;  Surgeon: Fabio Bering, MD;  Location: [facility];  Service: General;  Laterality: N/A;   Social History:  reports that he has been smoking.  He does not have any smokeless tobacco history on file. He reports that he does not drink alcohol or use illicit drugs.  Family / Support Systems Marital Status: Married Patient Roles: Spouse;Parent;Other (Comment) (Helps wife at [facility]) Spouse/Significant Other: Aram Beecham [phone]  [phone] Children: Three local children Anticipated Caregiver: Wife and children to when they can Ability/Limitations of Caregiver: Wife is small compared to pt, who is a large man. Caregiver Availability: 24/7 Family Dynamics: Close knit family who has always been there for one another.  Wife is realisitic regarding needing to go to another place for accessibility.  Camper where they live  is very samll and close quarters.  Social History Preferred language: English Religion: Baptist Cultural Background: No issues Education: High School Read: Yes Write: Yes Employment Status: Disabled Date Retired/Disabled/Unemployed: 1995 multiple  back surgeries Legal Hisotry/Current Legal Issues: No issues Guardian/Conservator: None according to MD pt is not capable of making his won decisions.  Will look toward wife to make decisions for pt.   Abuse/Neglect Physical Abuse: Denies Verbal Abuse: Denies Sexual Abuse: Denies Exploitation of patient/patient's resources: Denies Self-Neglect: Denies  Emotional Status Pt's affect, behavior adn adjustment status: Pt smiles and laughs some, when discussing his stroke.  Wife reports he has overcome many obstacles and has had numerous back surgeries and othe health issues.  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 Recent Psychosocial Issues: Other medical issues Pyschiatric History: No history deferred depression screen due to speech deficits.  Will continue to monitor and may have neuro-psych see while here. Substance Abuse History: Tobacco aware needs to quit.  Wife will work with him on this.  Patient / Family Perceptions, Expectations & Goals Pt/Family understanding of illness & functional limitations: Wife can explain his stroke and deficits.  She has been staying here with him and is participating in his care.  Pt tries to lift his affected arm.  Pt has dealt with the stroke and pneumonia since being here. Premorbid pt/family roles/activities: Husband, Father, Retiree, Home owner, etc Anticipated changes in roles/activities/participation: resume Pt/family expectations/goals: Pt states: " I need to be better."  Wife reprots: " I am hopeful he will do  well here, I can do what I can but he is tall and big."  [employer]: None Premorbid Home Care/DME Agencies: None Transportation available at discharge: Wife Resource  referrals recommended: Support group (specify) ([facility] Support group)  Discharge Planning Living Arrangements: Spouse/significant other Support Systems: Spouse/significant other;Children;Other relatives;Friends/neighbors;Church/faith community Type of Residence: Private residence 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  Option is possibly her parents home or to find a place to stay while here.  She is pursuing this already. Social Work Anticipated Follow Up Needs: HH/OP;Support Group  Clinical Impression Pleasant gentleman who has had a severe stroke.  His wife is very supportive and has been staying with him here.  She is of small statue and pt is a big man, see how much assist he will require at discharge. Encourage wife to locate a place soon since pt  Will probably be here 3 weeks.  Monitor pt's coping wile here.  Lucy Chris 03/24/2012, 11:17 AM

## 2012-03-24 NOTE — Evaluation (Addendum)
Physical Therapy Assessment and Plan  Patient Details  Name: Shaun Pugh MRN: [id number] Date of Birth: [birth year]  PT Diagnosis: Difficulty walking, Dizziness and giddiness, Hemiplegia -non-dominant, Hypotonia and Impaired cognition Rehab Potential: Good ELOS: 3.5-4 weeks   Today's Date: 03/24/2012 Time: 0905-1005 Time Calculation (min): 60 min  Problem List:  Patient Active Problem List  Diagnosis  . Acute pancreatitis  . Cholelithiasis  . Hyperglycemia  . Current every day smoker  . Atrophy of right kidney  . Kidney calculi  . Hemiplegia, unspecified, affecting nondominant side  . Stented coronary artery  . Stroke, acute, embolic, right  . Seizure  . Cytotoxic cerebral edema  . Aortic thromboembolism    Past Medical History:  Past Medical History  Diagnosis Date  . High cholesterol   . Atrophy of right kidney 09/15/2011  . Kidney calculi 09/15/2011  . Acute pancreatitis 09/14/2011  . Cholelithiasis 09/14/2011  . Chronic back pain   . Sleep apnea     STOP BANG 5  . TIA (transient ischemic attack)   . CAD (coronary artery disease)   . Stented coronary artery approx 2004   Past Surgical History:  Past Surgical History  Procedure Date  . Back surgery     x 4   . Cholecystectomy 09/25/2011    Procedure: LAPAROSCOPIC CHOLECYSTECTOMY;  Surgeon: Fabio Bering, MD;  Location: [facility];  Service: General;  Laterality: N/A;    Assessment & Plan Clinical Impression:  Shaun Pugh is a 61 y.o. RH-male with history of CAD, TIAs with left visual field deficits as well as dizziness/HA. Admitted on 03/17/12 with HA, left sided weakness and numbness with slurred speech. CT head negative and treated with tPA. CTA head/neck with hypodensity right superior gyrus, ulcerated plaque right ICA with 60% ICA stenosis, suspicion for 10mm focal thrombus proximal aortic arch. Patient developed combativeness with agitation as well as focal motor seizures LUE on 03/18/12. He was treated with  ativan and started on Keppra. F/u CCT with extensive area of low density affecting cortical and subcortical right hemisphere in MCA distribution. 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 Placed on ASA and no anticoagulation due to large stroke and risk of bleeding. EEG without epileptiform activity.  Patient transferred to CIR on 03/23/2012 .    Patient currently requires +2 assist with mobility secondary to impaired timing and sequencing, abnormal tone, unbalanced muscle activation and decreased motor planning, decreased visual motor skills, field cut and hemianopsia, decreased midline orientation, decreased attention to left, left side neglect and decreased motor planning and decreased initiation, decreased attention, decreased awareness, decreased problem solving, decreased safety awareness and delayed processing.  Prior to hospitalization, patient was independent with mobility and lived with Spouse in a Other (Comment) Facilities manager) home.  Home access is 2-3 steps.  Wife plans to find other situation for d/c   .  Patient will benefit from skilled PT intervention to maximize safe functional mobility, minimize fall risk and decrease caregiver burden for planned discharge home with 24 hour assist.  Anticipate patient will benefit from follow up [facility] at discharge.  PT - End of Session Activity Tolerance: Tolerates < 10 min activity, no significant change in vital signs, but c/o dizziness Endurance Deficit: Yes PT Assessment Rehab Potential: Good Barriers to Discharge: Inaccessible home environment PT Plan PT Frequency: 1-2 X/day, 60-90 minutes;5 out of 7 days Estimated Length of Stay: 3.5-4 weeks PT Treatment/Interventions: Ambulation/gait training;Balance/vestibular training;Discharge planning;DME/adaptive equipment  instruction;Functional mobility training;Functional electrical stimulation;Neuromuscular re-education;Patient/family  education;Splinting/orthotics;Stair training;Therapeutic Activities;Therapeutic Exercise;UE/LE Coordination activities;UE/LE Strength taining/ROM;Wheelchair propulsion/positioning;Visual/perceptual remediation/compensation PT Recommendation Follow Up Recommendations: Home health PT Equipment Recommended: Wheelchair (measurements);Wheelchair cushion (measurements);Rolling walker with 5" wheels  PT Evaluation Precautions/Restrictions- fall; L neglect, L field cut   Vital Signs Therapy Vitals Pulse Rate: 84 BP: 110/70 mmHg Patient Position, if appropriate:sitting Pain- none   Home Living/Prior Functioning Home Living Lives With: Spouse Available Help at Discharge: Available 24 hours/day Type of Home: Other (Comment) (Park Geophysicist/field seismologist) Home Adaptive Equipment: None Additional Comments: Patien and wife have lived in [place] for 7 years. Unsure where they will be moving to once patient is discharged. Will not be able to return to camper. Prior Function Level of Independence: Independent with basic ADLs;Independent with gait Able to Take Stairs?: Yes Driving: Yes Vocation: Retired Optometrist - History Patient Visual Report: Peripheral vision impairment Vision - Assessment Vision Assessment: Vision impaired - to be further tested in functional context                        Cognition Overall Cognitive Status: Impaired Arousal/Alertness: Awake/alert Orientation Level: Oriented X4 Focused Attention: Impaired Memory: Impaired Awareness: Impaired Problem Solving: Impaired Reasoning: Impaired Initiating: Impaired Self Monitoring: Impaired Safety/Judgment: Impaired Sensation Sensation Light Touch: Appears Intact (LLE) Proprioception: Appears Intact (LLE kne and ankle) Coordination Gross Motor Movements are Fluid and Coordinated: No Heel Shin Test: normal R; unable L Motor  Motor Motor: Hemiplegia ; hypotonic LLE and LUE Mobility Bed Mobility Rolling  Right: 1: +2 Total assist Rolling Right: Patient Percentage: 0% Rolling Right Details: Tactile cues for placement;Verbal cues for technique Rolling Left: 1: +1 Total assist Rolling Left: Patient Percentage: 20% Rolling Left Details: Tactile cues for placement;Verbal cues for technique Right Sidelying to Sit: 1: +2 Total assist Right Sidelying to Sit: Patient Percentage: 10% Right Sidelying to Sit Details: Verbal cues for technique;Tactile cues for placement Sitting - Scoot to Edge of Bed: 1: +1 Total assist Sitting - Scoot to Edge of Bed: Patient Percentage: 0% Transfers Sit to Stand: 1: +2 Total assist;From bed Sit to Stand Details: Verbal cues for technique;Verbal cues for sequencing;Visual cues/gestures for sequencing Stand to Sit: 1: +2 Total assist;To chair/3-in-1 Stand to Sit Details (indicate cue type and reason): Visual cues/gestures for sequencing;Verbal cues for technique;Verbal cues for sequencing Stand Pivot Transfers: 1: +2 Total assist Locomotion  Ambulation Ambulation: No Gait Gait: No Wheelchair Mobility Wheelchair Mobility: Yes Wheelchair Assistance: 3: Advertising copywriter Assistance Details: Verbal cues for technique;Verbal cues for precautions/safety Wheelchair Propulsion: Right upper extremity;Right lower extremity (RLE does not contact floor well, even with back cushion) Wheelchair Parts Management: Needs assistance Distance: 25  Trunk/Postural Assessment  Cervical Assessment Cervical Assessment: Within Functional Limits Thoracic Assessment Thoracic Assessment: Within Functional Limits Lumbar Assessment Lumbar Assessment: Within Functional Limits Postural Control Postural Control: Deficits on evaluation Righting Reactions: midline disorientation; pushes to L Protective Responses: delayed and inadequate Postural Limitations: decreased wt bearing L in sitting and standing  Balance Static Sitting Balance Static Sitting - Balance Support: Feet  unsupported;Right upper extremity supported Static Sitting - Level of Assistance: 5: Stand by assistance  Static standing balance- +2 assist for LLE stance and midline orientation  Extremity Assessment      RLE Assessment RLE Assessment: Within Functional Limits LLE Assessment LLE Assessment: Exceptions to [facility] LLE Strength LLE Overall Strength Comments: 0/5 hip, knee, and ankle; 2-/5 toe extension  See FIM for current functional status  Refer to Care Plan for Long Term Goals  Recommendations for other services: None  Discharge Criteria: Patient will be discharged from PT if patient refuses treatment 3 consecutive times without medical reason, if treatment goals not met, if there is a change in medical status, if patient makes no progress towards goals or if patient is discharged from hospital.  The above assessment, treatment plan, treatment alternatives and goals were discussed and mutually agreed upon: by patient and by family  Treatment today:  Wife [place] attended and observed.  Wife educated on facilitating pt to attend to L.   neuromuscular re-education via demo, VCs, tactile cues, for: -midline orientation in sitting -5 x 1 each R and L lateral leans (L forearm raised on 2 pillows)for L extremities and trunk activation -scooting on mat to L and R each x 3, focusing on elevating hip  W/c mobility using hemi technique with RUE and RLE, x 25' with mod assist.  Pt provided with back cushion to address hx of LBP, and  decrease posterior pelvic tilt, and facilitate self-propulsion. Pt barely reaches RLE to floor; he would benefit from sessions with cushion removed or lower w/c to facilitate w/c propulsion.  Visual/perceptual re-training while propelling w/c, with cues for attending to signs overhead and to L for route finding.  Pt required mod VCs to attend to L.  He was able to state room number, but not use signs for route finding.   Salvador Coupe 03/24/2012, 5:25 PM

## 2012-03-25 ENCOUNTER — Inpatient Hospital Stay (HOSPITAL_COMMUNITY): Payer: Medicare Other | Admitting: Speech Pathology

## 2012-03-25 ENCOUNTER — Inpatient Hospital Stay (HOSPITAL_COMMUNITY): Payer: Medicare Other

## 2012-03-25 ENCOUNTER — Inpatient Hospital Stay (HOSPITAL_COMMUNITY): Payer: Medicare Other | Admitting: Physical Therapy

## 2012-03-25 LAB — GLUCOSE, CAPILLARY

## 2012-03-25 MED ORDER — TRAZODONE HCL 50 MG PO TABS
50.0000 mg | ORAL_TABLET | Freq: Every day | ORAL | Status: DC
  Administered 2012-03-25 – 2012-03-29 (×5): 50 mg via ORAL
  Filled 2012-03-25 (×10): qty 1

## 2012-03-25 MED ORDER — LEVETIRACETAM 750 MG PO TABS
1250.0000 mg | ORAL_TABLET | Freq: Two times a day (BID) | ORAL | Status: DC
  Administered 2012-03-25 – 2012-04-20 (×53): 1250 mg via ORAL
  Filled 2012-03-25 (×59): qty 1

## 2012-03-25 NOTE — Progress Notes (Signed)
Patient evaluated for long-term disease management services with [facility] as benefit of his [employer]. However, he was not in the room upon visit. Will come back at a later time.   Raiford Noble, RN,BSN, MSN  [facility] Liaison [phone]

## 2012-03-25 NOTE — Progress Notes (Signed)
Patient ID: Shaun Pugh, male   DOB: 30-Jul-1950, 61 y.o.   MRN: [id number] Shaun Pugh is a 61 y.o. RH-male with history of CAD, TIAs with left visual field deficits as well as dizziness/HA. Admitted on 03/17/12 with HA, left sided weakness and numbness with slurred speech. CT head negative and treated with tPA. CTA head/neck with hypodensity right superior gyrus, ulcerated plaque right ICA with 60% ICA stenosis, suspicion for 10mm focal thrombus proximal aortic arch. Patient developed combativeness with agitation as well as focal motor seizures LUE on 03/18/12. He was treated with ativan and started on Keppra. F/u CCT with extensive area of low density affecting cortical and subcortical right hemisphere in MCA distribution. 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 Placed on ASA and no anticoagulation due to large stroke and risk of bleeding. EEG without epileptiform activity. He did develop fevers and was started on antibiotics for LLL infiltrate. Carotid dopplers done revealing 60-79% ICA stenosis. 2D echo with EF 65-70%   Subjective/Complaints: Difficulty finding a comfortable position  Review of Systems  Constitutional: Positive for malaise/fatigue.  Respiratory: Negative for cough.   Cardiovascular: Negative for chest pain.  Neurological: Positive for sensory change, speech change and focal weakness. Negative for seizures.  All other systems reviewed and are negative.    Objective: Vital Signs: Blood pressure 118/74, pulse 74, temperature 98.2 F (36.8 C), temperature source Oral, resp. rate 17, weight 116.9 kg (257 lb 11.5 oz), SpO2 97.00%. Dg Chest 2 View  03/23/2012  *RADIOLOGY REPORT*  Clinical Data: Left lower lobe infiltrate.  Cough  CHEST - 2 VIEW  Comparison: 03/20/2012  Findings: Normal heart size.  No pleural effusion or edema.  Left lower lobe opacity is stable to improved in the interval.  No new findings.  IMPRESSION:   1.  Stable to improved left lower lobe pneumonia.   Original Report Authenticated By: Signa Kell, M.D.    Results for orders placed during the hospital encounter of 03/23/12 (from the past 72 hour(s))  CBC WITH DIFFERENTIAL     Status: Abnormal   Collection Time   03/24/12  6:15 AM      Component Value Range Comment   WBC 12.6 (*) 4.0 - 10.5 K/uL    RBC 4.22  4.22 - 5.81 MIL/uL    Hemoglobin 12.9 (*) 13.0 - 17.0 g/dL    HCT 51.7 (*) 61.6 - 52.0 %    MCV 87.2  78.0 - 100.0 fL    MCH 30.6  26.0 - 34.0 pg    MCHC 35.1  30.0 - 36.0 g/dL    RDW 07.3  71.0 - 62.6 %    Platelets 212  150 - 400 K/uL    Neutrophils Relative 67  43 - 77 %    Neutro Abs 8.4 (*) 1.7 - 7.7 K/uL    Lymphocytes Relative 19  12 - 46 %    Lymphs Abs 2.4  0.7 - 4.0 K/uL    Monocytes Relative 11  3 - 12 %    Monocytes Absolute 1.3 (*) 0.1 - 1.0 K/uL    Eosinophils Relative 4  0 - 5 %    Eosinophils Absolute 0.5  0.0 - 0.7 K/uL    Basophils Relative 0  0 - 1 %    Basophils Absolute 0.1  0.0 - 0.1 K/uL   COMPREHENSIVE METABOLIC PANEL     Status: Abnormal   Collection Time   03/24/12  6:15  AM      Component Value Range Comment   Sodium 141  135 - 145 mEq/L    Potassium 3.4 (*) 3.5 - 5.1 mEq/L    Chloride 108  96 - 112 mEq/L    CO2 21  19 - 32 mEq/L    Glucose, Bld 105 (*) 70 - 99 mg/dL    BUN 17  6 - 23 mg/dL    Creatinine, Ser 0.98  0.50 - 1.35 mg/dL    Calcium 8.7  8.4 - 11.9 mg/dL    Total Protein 7.2  6.0 - 8.3 g/dL    Albumin 3.1 (*) 3.5 - 5.2 g/dL    AST 19  0 - 37 U/L    ALT 35  0 - 53 U/L    Alkaline Phosphatase 61  39 - 117 U/L    Total Bilirubin 0.6  0.3 - 1.2 mg/dL    GFR calc non Af Amer >90  >90 mL/min    GFR calc Af Amer >90  >90 mL/min   GLUCOSE, CAPILLARY     Status: Abnormal   Collection Time   03/24/12  5:10 PM      Component Value Range Comment   Glucose-Capillary 111 (*) 70 - 99 mg/dL    Comment 1 Documented in Chart      Comment 2 Notify RN     GLUCOSE, CAPILLARY     Status:  Normal   Collection Time   03/24/12  9:08 PM      Component Value Range Comment   Glucose-Capillary 90  70 - 99 mg/dL      Nursing note and vitals reviewed.  Constitutional: He appears well-developed and well-nourished.  HENT:  Head: Normocephalic and atraumatic.  Eyes: Pupils are equal, round, and reactive to light.  Neck: Normal range of motion. Neck supple.  Cardiovascular: Normal rate and regular rhythm.  Pulmonary/Chest: Effort normal and breath sounds normal.  Abdominal: Soft. Bowel sounds are normal.  Musculoskeletal: He exhibits no edema.  Neurological: He is alert.  Oriented to self and situation. Place perseverates on "[place]". Able to follow basic commands. Dense left hemiparesis.0/5 on R side. Mild left inattention. Left field cut--wife reports minimal vision limited to LUQ in left eye.  Skin: Skin is warm and dry.     Assessment/Plan: 1. Functional deficits secondary to Large R MCA infarct with L HP, Lhemisensory def, cognitive deficits which require 3+ hours per day of interdisciplinary therapy in a comprehensive inpatient rehab setting. Physiatrist is providing close team supervision and 24 hour management of active medical problems listed below. Physiatrist and rehab team continue to assess barriers to discharge/monitor patient progress toward functional and medical goals. FIM: FIM - Bathing Bathing Steps Patient Completed: Chest;Right Arm;Left Arm;Abdomen;Front perineal area;Buttocks;Right upper leg;Left upper leg;Right lower leg (including foot);Left lower leg (including foot) Bathing: 2: Max-Patient completes 3-4 53f 10 parts or 25-49%  FIM - Upper Body Dressing/Undressing Upper body dressing/undressing: 0: Wears gown/pajamas-no public clothing FIM - Lower Body Dressing/Undressing Lower body dressing/undressing: 0: Wears Oceanographer  FIM - Toileting Toileting: 1: Two helpers  FIM - Archivist Transfers: 1-Two  helpers  FIM - Press photographer: 2: Bed > Chair or W/C: Max A (lift and lower assist);2: Chair or W/C > Bed: Max A (lift and lower assist)  FIM - Locomotion: Wheelchair Distance: 25 Locomotion: Wheelchair: 1: Travels less than 50 ft with moderate assistance (Pt: 50 - 74%) FIM - Locomotion: Ambulation Locomotion: Ambulation: 0: Activity  did not occur  Comprehension Comprehension Mode: Auditory Comprehension: 4-Understands basic 75 - 89% of the time/requires cueing 10 - 24% of the time  Expression Expression Mode: Verbal Expression: 5-Expresses complex 90% of the time/cues < 10% of the time  Social Interaction Social Interaction: 5-Interacts appropriately 90% of the time - Needs monitoring or encouragement for participation or interaction.  Problem Solving Problem Solving: 4-Solves basic 75 - 89% of the time/requires cueing 10 - 24% of the time  Memory Memory: 4-Recognizes or recalls 75 - 89% of the time/requires cueing 10 - 24% of the time   Medical Problem List and Plan:  1. DVT Prophylaxis/Anticoagulation: Pharmaceutical: Lovenox  2. Pain Management: Was on started on NSAIDS PTA due to left foot pain. Will monitor for symptoms with increase in activity levels. Continue prn tylenol for now.  3. Mood: patient with poor awareness of deficits. Will monitor for now. LCSW to follow for formal evaluation.  4. Neuropsych: This patient is not capable of making decisions on his/her own behalf.  5. LLL PNA: Treated with short course antibiotics. Residual mild leukocytosis 6. HTN: monitor with bid checks. Off antihypertensives at current time.  7. Focal motor seizures: continue keppra 1250 mg bid. Change to  8. Insomnia/Hallucinations: Will discontinue seroquel as causing fatigue/daytime sedation. Will schedule trazodone at bedtime.  9. Symptomatic R-ICA stenosis: continue ASA and plavix. Neurology indicates for surgery past discharge?  LOS (Days) 2 A FACE TO FACE  EVALUATION WAS PERFORMED  KIRSTEINS,ANDREW E 03/25/2012, 7:18 AM

## 2012-03-25 NOTE — Progress Notes (Signed)
Physical Therapy Session Note  Patient Details  Name: Shaun Pugh MRN: [id number] Date of Birth: 28-Aug-1950  Today's Date: 03/25/2012 Time: 4098-1191 Time Calculation (min): 28 min  Short Term Goals: Week 1:  PT Short Term Goal 1 (Week 1): Pt will move supine > sit with min assist from R. PT Short Term Goal 2 (Week 1): Pt will perform transfer bed><w/c with mod assist. PT Short Term Goal 3 (Week 1): Pt will stand x 5 minutes during UE task at table,  with assist of 1 person PT Short Term Goal 4 (Week 1): Pt will propel appropriate- height w/c x 50' with mod assist.  Skilled Therapeutic Interventions/Progress Updates:  Worked with patient on bed mobility activities:  Patient required min-mod A to scoot to head of bed with head of bed lowered, using right side rail, and cuing patient to use right lower extremity to push upward in bed.  Instructed patient in clasping activities to engage cognition of left upper extremity through assisted adduction which facilitates trunk rotation for improved bed mobility.  Worked with patient on resisted left lower extremity hip and knee extension exercises and bilateral lower extremity bridging for improved mobility and to improve sit/stand transfers.  Patient required max cuing to attend to task during all activities and extended response time.     Therapy Documentation Precautions:  Precautions Precautions: Fall Precaution Comments: Left sided weakness and lack of attention  Restrictions Weight Bearing Restrictions: No   Pain: Pain Assessment Pain Assessment: No/denies pain   Locomotion : Ambulation Ambulation/Gait Assistance: 2: Max assist Wheelchair Mobility Distance: 40 feet   See FIM for current functional status  Therapy/Group: Individual Therapy  Rexene Agent 03/25/2012, 2:46 PM

## 2012-03-25 NOTE — Progress Notes (Signed)
Occupational Therapy Session Note  Patient Details  Name: Shaun Pugh MRN: [id number] Date of Birth: 05-20-1950  Today's Date: 03/25/2012 Time: 0730-0830 Time Calculation (min): 60 min  Short Term Goals: Week 1:  OT Short Term Goal 1 (Week 1): Patient will perform grooming with Mod Assist using LUE as stabilizer. OT Short Term Goal 2 (Week 1): Patient will perform bating with Mod Assist. OT Short Term Goal 3 (Week 1): Patient will perform dressing with Max Assist. OT Short Term Goal 4 (Week 1): Patient will increase sitting balance from Poor- to Poor+ to increase functional performance during bathing and dressing. OT Short Term Goal 5 (Week 1): Patient will increase toilet transfer to Max Assist.  Skilled Therapeutic Interventions/Progress Updates:    ADL re-training completed with wife present observing. Patient's wife stated that pt needed to use the bathroom. Angelica confirmed. Patient had difficulty following commands this morning (needed max encouragement to help go from left sidelying to sitting on EOB, was asking to return to bed as soon as he was up in w/c). Once on the toilet patient was unable to go. Bathing and dressing completed while seated on toilet. Patient educated on hemi-dressing technique when donning t-shirt; Max diff; Max vc's for technique w/ increased assistance. Very little carryover of education. Patient was not interested in learning this session. While therapist assisted patient to clean his dentures provided education to wife regarding having patient do as much for himself as possible while he is recovering.   Therapy Documentation Precautions:  Precautions Precautions: Fall Precaution Comments: Left sided weakness and lack of attention  Restrictions Weight Bearing Restrictions: No Pain: Pain Assessment Pain Assessment: No/denies pain  See FIM for current functional status  Therapy/Group: Individual Therapy  Limmie Patricia, OTR/L 03/25/2012, 10:48  AM

## 2012-03-25 NOTE — Progress Notes (Signed)
Speech Language Pathology Daily Session Note  Patient Details  Name: HERSON PRICHARD MRN: [id number] Date of Birth: 1950/06/05  Today's Date: 03/25/2012 Time: 4098-1191 Time Calculation (min): 40 min  Short Term Goals: Week 1: SLP Short Term Goal 1 (Week 1): Patient will utilize small bites/sips and slow rate while consuming regular textures and thin liquids with mod A multimodal cuing. SLP Short Term Goal 2 (Week 1): Patient will solve basic problems during functional tasks with mod A visual and verbal cueing. SLP Short Term Goal 3 (Week 1): Patient will attend to the left sided during basic functional tasks with mod A verbal cuing.   SLP Short Term Goal 4 (Week 1): Patient will demonstrate emergent awareness by asking for help when needed with max A verbal cueing.   SLP Short Term Goal 5 (Week 1): Patient will alternate attention between the clinician and a basic functional task with mod A verbal and visual cues for redirection. SLP Short Term Goal 6 (Week 1): Patient will utilize external aids to facilitate recall of new, daily information with max A multimodal cueing.    Skilled Therapeutic Interventions: Treatment session focused on addressing cognition with basic tasks.  Patient demonstrated selective attention to tasks for 2 minute periods with breaks and then SLP cues to re-initiate tasks.  Patient required max-mod assist verbal, visual and tactile cues to attend to the left of environment during sort coins tasks, writing tasks and max assist to maintain eye contact to make eye contact with SLP seated on left.  During transfer back to bed patient demonstrated improved initiation and attention to left upper  extremity SLP suspects this was due to motivation to complete task.    FIM:  Comprehension Comprehension Mode: Auditory Comprehension: 4-Understands basic 75 - 89% of the time/requires cueing 10 - 24% of the time Expression Expression Mode: Verbal Expression: 5-Expresses basic 90%  of the time/requires cueing < 10% of the time. Social Interaction Social Interaction: 4-Interacts appropriately 75 - 89% of the time - Needs redirection for appropriate language or to initiate interaction. Problem Solving Problem Solving: 2-Solves basic 25 - 49% of the time - needs direction more than half the time to initiate, plan or complete simple activities Memory Memory: 4-Recognizes or recalls 75 - 89% of the time/requires cueing 10 - 24% of the time FIM - Eating Eating Activity: 5: Set-up assist for open containers;5: Set-up assist for cut food;5: Needs verbal cues/supervision  Pain Pain Assessment Pain Assessment: No/denies pain (fatigue)  Therapy/Group: Individual Therapy  Charlane Ferretti., CCC-SLP [phone]  Haile Toppins 03/25/2012, 4:18 PM

## 2012-03-25 NOTE — Progress Notes (Signed)
Physical Therapy Session Note  Patient Details  Name: Shaun Pugh MRN: [id number] Date of Birth: May 14, 1950  Today's Date: 03/25/2012 Time: 4098-1191 Time Calculation (min): 53 min  Short Term Goals: Week 1:  PT Short Term Goal 1 (Week 1): Pt will move supine > sit with min assist from R. PT Short Term Goal 2 (Week 1): Pt will perform transfer bed><w/c with mod assist. PT Short Term Goal 3 (Week 1): Pt will stand x 5 minutes during UE task at table,  with assist of 1 person PT Short Term Goal 4 (Week 1): Pt will propel appropriate- height w/c x 50' with mod assist.  Skilled Therapeutic Interventions/Progress Updates:  Patient eating breakfast upon entering room (late tray). Patient required cueing and assist to set up wheelchair for transfer. Patient transferred wheelchair to mat going to right with max assist. During transfer, patient went sit to stand with mod assist. Patient requires assist to control left LE. Patient worked on sitting edge of mat and keeping head in midline. Patient sit to stand and stood with mas assist for right knee control. Patient was getting muscle contractions of hip extensors and some at knee as well. Patient stepped forward and back with right LE to work on stance on left. Patient transferred mat to wheelchair stand turn with max assist primarily for left LE. Patient stood and ambulated 5 feet x 2 in parallel bars. Patient required max assist for left knee control during stance and max assist to progress left LE during swing. Patient attempted wheelchair propulsion using right extremities. Patient required max assist to use right LE and to avoid objects on left.    Therapy Documentation Precautions:  Precautions Precautions: Fall Precaution Comments: Left sided weakness and lack of attention  Restrictions Weight Bearing Restrictions: No  Pain: Pain Assessment Pain Assessment: No/denies pain  Locomotion : Ambulation Ambulation/Gait Assistance: 2: Max  assist Wheelchair Mobility Distance: 40 feet   See FIM for current functional status  Therapy/Group: Individual Therapy  Alma Friendly 03/25/2012, 12:06 PM

## 2012-03-25 NOTE — Progress Notes (Signed)
Patient LBM 11/11, per report. Noted patient received Miralax 17g around noon today, offered suppository this evening with meds, patient refused and wanted to see if the Miralax will work by tomorrow.

## 2012-03-26 ENCOUNTER — Inpatient Hospital Stay (HOSPITAL_COMMUNITY): Payer: Medicare Other | Admitting: *Deleted

## 2012-03-26 ENCOUNTER — Inpatient Hospital Stay (HOSPITAL_COMMUNITY): Payer: Medicare Other | Admitting: Occupational Therapy

## 2012-03-26 ENCOUNTER — Inpatient Hospital Stay (HOSPITAL_COMMUNITY): Payer: Medicare Other | Admitting: Speech Pathology

## 2012-03-26 MED ORDER — SENNOSIDES-DOCUSATE SODIUM 8.6-50 MG PO TABS
2.0000 | ORAL_TABLET | Freq: Two times a day (BID) | ORAL | Status: DC
  Administered 2012-03-26 – 2012-04-19 (×45): 2 via ORAL
  Filled 2012-03-26 (×49): qty 2

## 2012-03-26 NOTE — Progress Notes (Signed)
Physical Therapy Session Note  Patient Details  Name: Shaun Pugh MRN: [id number] Date of Birth: 10-29-1950  Today's Date: 03/26/2012 Time: 0905-1000 Time Calculation (min): 55 min  Short Term Goals: Week 1:  PT Short Term Goal 1 (Week 1): Pt will move supine > sit with min assist from R. PT Short Term Goal 2 (Week 1): Pt will perform transfer bed><w/c with mod assist. PT Short Term Goal 3 (Week 1): Pt will stand x 5 minutes during UE task at table,  with assist of 1 person PT Short Term Goal 4 (Week 1): Pt will propel appropriate- height w/c x 50' with mod assist.  Skilled Therapeutic Interventions/Progress Updates:  Pt required encouragement for continued participation, but is making good progress despite L neglect.  Supine bridging for donning pants with assist for LLE placement and cues for technique.  Rolling L with Mod A, sidelying >sit with Max A for LE and trunk.  Initially needed Mod A for static sitting balance, progressing to Min x65min.  Stand-pivot transfer with Max A for lifting and turning to R.  Performed prolonged standing in standing frame for RLE and UE NMR, focusing on L attention tasks placing pegs in board. Pt able to assist with stand significantly, bringing hips out of harness each trial, but limited by activity tolerance and balance. Pt stood 3x67min with seated rest between. Pt educated on importance of OOB activity despite fatigue as much as possible. Pt needing Mod A to reduce L lean in standing initially, but able to progress to Min A last trial x1 min with bil UE assist.  Pt very fatigued and wanting to go back to room. Pt instructed and provided demonstration on hemi-technique and timing with R shoe donned without lap tray (rubbing). Pt propelled 63' with up to Mod A for steering away from walls, but able to use tiles for navigation - did quite well! Cues needed for using foot to turn and timing with UE. Pt highly motivated for this task in order to get back to  room. Increased time required.      Therapy Documentation Precautions:  Precautions Precautions: Fall Precaution Comments: Left sided weakness and lack of attention  Restrictions Weight Bearing Restrictions: No   Pain: None     Locomotion : Wheelchair Mobility Distance: 60   See FIM for current functional status  Therapy/Group: Individual Therapy  Virl Cagey, PT 03/26/2012, 11:21 AM

## 2012-03-26 NOTE — Progress Notes (Signed)
Speech Language Pathology Daily Session Note  Patient Details  Name: Shaun Pugh MRN: [id number] Date of Birth: [birth year]  Today's Date: 03/26/2012 Time: 1100-1125 Time Calculation (min): 25 min  Short Term Goals: Week 1: SLP Short Term Goal 1 (Week 1): Patient will utilize small bites/sips and slow rate while consuming regular textures and thin liquids with mod A multimodal cuing. SLP Short Term Goal 2 (Week 1): Patient will solve basic problems during functional tasks with mod A visual and verbal cueing. SLP Short Term Goal 3 (Week 1): Patient will attend to the left sided during basic functional tasks with mod A verbal cuing.   SLP Short Term Goal 4 (Week 1): Patient will demonstrate emergent awareness by asking for help when needed with max A verbal cueing.   SLP Short Term Goal 5 (Week 1): Patient will alternate attention between the clinician and a basic functional task with mod A verbal and visual cues for redirection. SLP Short Term Goal 6 (Week 1): Patient will utilize external aids to facilitate recall of new, daily information with max A multimodal cueing.    Skilled Therapeutic Interventions: Treatment focus on cognitive goals. Pt was extremely fatigued and lethargic due to multiple therapy sessions this morning and needed encouragement to participate. Pt participated in basic problem solving/attention task of sequencing 4 step picture cards. Pt required total A for problem solving and for scanning to the left with the task. Pt demonstrated sustained attention to task for ~30-60 seconds with Mod verbal cues for redirection.    FIM:  Comprehension Comprehension: 3-Understands basic 50 - 74% of the time/requires cueing 25 - 50%  of the time Expression Expression: 4-Expresses basic 75 - 89% of the time/requires cueing 10 - 24% of the time. Needs helper to occlude trach/needs to repeat words. Social Interaction Social Interaction: 2-Interacts appropriately 25 - 49% of time -  Needs frequent redirection. Problem Solving Problem Solving: 1-Solves basic less than 25% of the time - needs direction nearly all the time or does not effectively solve problems and may need a restraint for safety Memory Memory: 4-Recognizes or recalls 75 - 89% of the time/requires cueing 10 - 24% of the time  Pain Pain Assessment Pain Assessment: No/denies pain Pain Score: 0-No pain  Therapy/Group: Individual Therapy  Lorissa Kishbaugh 03/26/2012, 11:30 AM

## 2012-03-26 NOTE — Progress Notes (Signed)
Patient ID: Shaun Pugh, male   DOB: 1951/03/31, 61 y.o.   MRN: [id number] Shaun Pugh is a 61 y.o. RH-male with history of CAD, TIAs with left visual field deficits as well as dizziness/HA. Admitted on 03/17/12 with HA, left sided weakness and numbness with slurred speech. CT head negative and treated with tPA. CTA head/neck with hypodensity right superior gyrus, ulcerated plaque right ICA with 60% ICA stenosis, suspicion for 10mm focal thrombus proximal aortic arch. Patient developed combativeness with agitation as well as focal motor seizures LUE on 03/18/12. He was treated with ativan and started on Keppra. F/u CCT with extensive area of low density affecting cortical and subcortical right hemisphere in MCA distribution. 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 Placed on ASA and no anticoagulation due to large stroke and risk of bleeding. EEG without epileptiform activity. He did develop fevers and was started on antibiotics for LLL infiltrate. Carotid dopplers done revealing 60-79% ICA stenosis. 2D echo with EF 65-70%   Subjective/Complaints: Laying on L side denies shoulder pain Constipated   Review of Systems  Constitutional: Positive for malaise/fatigue.  Respiratory: Negative for cough.   Cardiovascular: Negative for chest pain.  Neurological: Positive for sensory change, speech change and focal weakness. Negative for seizures.  All other systems reviewed and are negative.    Objective: Vital Signs: Blood pressure 155/82, pulse 73, temperature 98 F (36.7 C), temperature source Oral, resp. rate 17, weight 116.9 kg (257 lb 11.5 oz), SpO2 96.00%. No results found. Results for orders placed during the hospital encounter of 03/23/12 (from the past 72 hour(s))  CBC WITH DIFFERENTIAL     Status: Abnormal   Collection Time   03/24/12  6:15 AM      Component Value Range Comment   WBC 12.6 (*) 4.0 - 10.5 K/uL    RBC 4.22  4.22 -  5.81 MIL/uL    Hemoglobin 12.9 (*) 13.0 - 17.0 g/dL    HCT 40.9 (*) 81.1 - 52.0 %    MCV 87.2  78.0 - 100.0 fL    MCH 30.6  26.0 - 34.0 pg    MCHC 35.1  30.0 - 36.0 g/dL    RDW 91.4  78.2 - 95.6 %    Platelets 212  150 - 400 K/uL    Neutrophils Relative 67  43 - 77 %    Neutro Abs 8.4 (*) 1.7 - 7.7 K/uL    Lymphocytes Relative 19  12 - 46 %    Lymphs Abs 2.4  0.7 - 4.0 K/uL    Monocytes Relative 11  3 - 12 %    Monocytes Absolute 1.3 (*) 0.1 - 1.0 K/uL    Eosinophils Relative 4  0 - 5 %    Eosinophils Absolute 0.5  0.0 - 0.7 K/uL    Basophils Relative 0  0 - 1 %    Basophils Absolute 0.1  0.0 - 0.1 K/uL   COMPREHENSIVE METABOLIC PANEL     Status: Abnormal   Collection Time   03/24/12  6:15 AM      Component Value Range Comment   Sodium 141  135 - 145 mEq/L    Potassium 3.4 (*) 3.5 - 5.1 mEq/L    Chloride 108  96 - 112 mEq/L    CO2 21  19 - 32 mEq/L    Glucose, Bld 105 (*) 70 - 99 mg/dL    BUN 17  6 - 23 mg/dL  Creatinine, Ser 0.85  0.50 - 1.35 mg/dL    Calcium 8.7  8.4 - 16.1 mg/dL    Total Protein 7.2  6.0 - 8.3 g/dL    Albumin 3.1 (*) 3.5 - 5.2 g/dL    AST 19  0 - 37 U/L    ALT 35  0 - 53 U/L    Alkaline Phosphatase 61  39 - 117 U/L    Total Bilirubin 0.6  0.3 - 1.2 mg/dL    GFR calc non Af Amer >90  >90 mL/min    GFR calc Af Amer >90  >90 mL/min   GLUCOSE, CAPILLARY     Status: Abnormal   Collection Time   03/24/12  5:10 PM      Component Value Range Comment   Glucose-Capillary 111 (*) 70 - 99 mg/dL    Comment 1 Documented in Chart      Comment 2 Notify RN     GLUCOSE, CAPILLARY     Status: Normal   Collection Time   03/24/12  9:08 PM      Component Value Range Comment   Glucose-Capillary 90  70 - 99 mg/dL   GLUCOSE, CAPILLARY     Status: Normal   Collection Time   03/25/12  7:24 AM      Component Value Range Comment   Glucose-Capillary 98  70 - 99 mg/dL    Comment 1 Notify RN      Comment 2 Documented in Chart     GLUCOSE, CAPILLARY     Status: Normal    Collection Time   03/25/12 11:24 AM      Component Value Range Comment   Glucose-Capillary 98  70 - 99 mg/dL    Comment 1 Notify RN      Comment 2 Documented in Chart        Nursing note and vitals reviewed.  Constitutional: He appears well-developed and well-nourished.  HENT:  Head: Normocephalic and atraumatic.  Eyes: Pupils are equal, round, and reactive to light.  Neck: Normal range of motion. Neck supple.  Cardiovascular: Normal rate and regular rhythm.  Pulmonary/Chest: Effort normal and breath sounds normal.  Abdominal: Soft. Bowel sounds are normal.  Musculoskeletal: He exhibits no edema.  Neurological: He is alert.  Oriented to self and situation. Place perseverates on "[place]". Able to follow basic commands. Dense left hemiparesis.0/5 on R side. Mild left inattention. Left field cut--wife reports minimal vision limited to LUQ in left eye.  Skin: Skin is warm and dry.     Assessment/Plan: 1. Functional deficits secondary to Large R MCA infarct with L HP, L hemisensory def, cognitive deficits which require 3+ hours per day of interdisciplinary therapy in a comprehensive inpatient rehab setting. Physiatrist is providing close team supervision and 24 hour management of active medical problems listed below. Physiatrist and rehab team continue to assess barriers to discharge/monitor patient progress toward functional and medical goals. FIM: FIM - Bathing Bathing Steps Patient Completed: Chest;Right Arm;Left Arm;Abdomen;Front perineal area;Buttocks;Right upper leg;Left upper leg;Right lower leg (including foot);Left lower leg (including foot) Bathing: 2: Max-Patient completes 3-4 25f 10 parts or 25-49%  FIM - Upper Body Dressing/Undressing Upper body dressing/undressing steps patient completed: Thread/unthread right sleeve of pullover shirt/dresss;Thread/unthread left sleeve of pullover shirt/dress;Put head through opening of pull over shirt/dress;Pull shirt over trunk Upper  body dressing/undressing: 1: Total-Patient completed less than 25% of tasks FIM - Lower Body Dressing/Undressing Lower body dressing/undressing steps patient completed: Thread/unthread right pants leg;Thread/unthread left pants leg;Pull pants  up/down;Don/Doff right sock;Don/Doff left sock;Don/Doff right shoe;Don/Doff left shoe;Fasten/unfasten right shoe;Fasten/unfasten left shoe Lower body dressing/undressing: 1: Two helpers  FIM - Toileting Toileting: 1: Two helpers  FIM - Diplomatic Services operational officer Devices: Therapist, music Transfers: 1-Two helpers  FIM - Press photographer: 0: Activity did not occur  FIM - Locomotion: Wheelchair Distance: 40 feet Locomotion: Wheelchair: 0: Activity did not occur FIM - Locomotion: Ambulation Locomotion: Ambulation Assistive Devices: Parallel bars Ambulation/Gait Assistance: 2: Max assist Locomotion: Ambulation: 0: Activity did not occur  Comprehension Comprehension Mode: Auditory Comprehension: 4-Understands basic 75 - 89% of the time/requires cueing 10 - 24% of the time  Expression Expression Mode: Verbal Expression: 5-Expresses basic 90% of the time/requires cueing < 10% of the time.  Social Interaction Social Interaction: 4-Interacts appropriately 75 - 89% of the time - Needs redirection for appropriate language or to initiate interaction.  Problem Solving Problem Solving: 2-Solves basic 25 - 49% of the time - needs direction more than half the time to initiate, plan or complete simple activities  Memory Memory: 4-Recognizes or recalls 75 - 89% of the time/requires cueing 10 - 24% of the time   Medical Problem List and Plan:  1. DVT Prophylaxis/Anticoagulation: Pharmaceutical: Lovenox  2. Pain Management: Was on started on NSAIDS PTA due to left foot pain. Will monitor for symptoms with increase in activity levels. Continue prn tylenol for now.  3. Mood: patient with poor awareness of deficits. Will  monitor for now. LCSW to follow for formal evaluation.  4. Neuropsych: This patient is not capable of making decisions on his/her own behalf.  5. LLL PNA: Treated with short course antibiotics. Residual mild leukocytosis 6. HTN: monitor with bid checks. Off antihypertensives at current time.  7. Focal motor seizures: continue keppra 1250 mg bid. Change to  8. Insomnia/Hallucinations: Will discontinue seroquel as causing fatigue/daytime sedation. Will schedule trazodone at bedtime.  9. Symptomatic R-ICA stenosis: continue ASA and plavix. Neurology indicates for surgery past discharge?  LOS (Days) 3 A FACE TO FACE EVALUATION WAS PERFORMED  KIRSTEINS,ANDREW E 03/26/2012, 8:20 AM

## 2012-03-26 NOTE — Progress Notes (Signed)
Occupational Therapy Session Note  Patient Details  Name: GREG CRATTY MRN: [id number] Date of Birth: 06/29/1950  Today's Date: 03/26/2012 Time: 1415-1500 and 1000-1100 Time Calculation (min): 45 minand 60 min = 105 total min  Skilled Therapeutic Interventions/Progress Updates: AM:  Bath/dressing in w/c at sink with focus on postural control, left attention, HOH assist to use left UE and standing balance for periarea cleansing and pulling up pants PM:  Neuro reeducation, trunk control and bed mobility    Therapy Documentation Precautions:  Precautions Precautions: Fall Precaution Comments: Left sided weakness and lack of attention  Restrictions Weight Bearing Restrictions: No    Pain:6/10 head ache alerted RN         See FIM for current functional status  Therapy/Group: Individual Therapy  Bud Face [facility] 03/26/2012, 3:51 PM

## 2012-03-27 ENCOUNTER — Inpatient Hospital Stay (HOSPITAL_COMMUNITY): Payer: Medicare Other | Admitting: Physical Therapy

## 2012-03-27 NOTE — Plan of Care (Signed)
Problem: RH BOWEL ELIMINATION Goal: RH STG MANAGE BOWEL WITH ASSISTANCE STG Manage Bowel with min Assistance.  Outcome: Not Progressing LBM 11/11, patient refuses suppository, but will take PO laxitives

## 2012-03-27 NOTE — Progress Notes (Addendum)
Patient ID: Shaun Pugh, male   DOB: May 16, 1950, 61 y.o.   MRN: [id number] Shaun Pugh is a 61 y.o. RH-male with history of CAD, TIAs with left visual field deficits as well as dizziness/HA. Admitted on 03/17/12 with HA, left sided weakness and numbness with slurred speech. CT head negative and treated with tPA. CTA head/neck with hypodensity right superior gyrus, ulcerated plaque right ICA with 60% ICA stenosis, suspicion for 10mm focal thrombus proximal aortic arch. Patient developed combativeness with agitation as well as focal motor seizures LUE on 03/18/12. He was treated with ativan and started on Keppra. F/u CCT with extensive area of low density affecting cortical and subcortical right hemisphere in MCA distribution. 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 Placed on ASA and no anticoagulation due to large stroke and risk of bleeding. EEG without epileptiform activity. He did develop fevers and was started on antibiotics for LLL infiltrate. Carotid dopplers done revealing 60-79% ICA stenosis. 2D echo with EF 65-70%   Subjective/Complaints: Slept on back Constipated   Review of Systems  Constitutional: Positive for malaise/fatigue.  Respiratory: Negative for cough.   Cardiovascular: Negative for chest pain.  Neurological: Positive for sensory change, speech change and focal weakness. Negative for seizures.  All other systems reviewed and are negative.    Objective: Vital Signs: Blood pressure 159/82, pulse 79, temperature 97.8 F (36.6 C), temperature source Oral, resp. rate 20, weight 116.9 kg (257 lb 11.5 oz), SpO2 96.00%. No results found. Results for orders placed during the hospital encounter of 03/23/12 (from the past 72 hour(s))  GLUCOSE, CAPILLARY     Status: Abnormal   Collection Time   03/24/12  5:10 PM      Component Value Range Comment   Glucose-Capillary 111 (*) 70 - 99 mg/dL    Comment 1 Documented in Chart       Comment 2 Notify RN     GLUCOSE, CAPILLARY     Status: Normal   Collection Time   03/24/12  9:08 PM      Component Value Range Comment   Glucose-Capillary 90  70 - 99 mg/dL   GLUCOSE, CAPILLARY     Status: Normal   Collection Time   03/25/12  7:24 AM      Component Value Range Comment   Glucose-Capillary 98  70 - 99 mg/dL    Comment 1 Notify RN      Comment 2 Documented in Chart     GLUCOSE, CAPILLARY     Status: Normal   Collection Time   03/25/12 11:24 AM      Component Value Range Comment   Glucose-Capillary 98  70 - 99 mg/dL    Comment 1 Notify RN      Comment 2 Documented in Chart        Nursing note and vitals reviewed.  Constitutional: He appears well-developed and well-nourished.  HENT:  Head: Normocephalic and atraumatic.  Eyes: Pupils are equal, round, and reactive to light.  Neck: Normal range of motion. Neck supple.  Cardiovascular: Normal rate and regular rhythm.  Pulmonary/Chest: Effort normal and breath sounds normal.  Abdominal: Soft. Bowel sounds are normal.  Musculoskeletal: He exhibits no edema.  Neurological: He is alert.  Oriented to self and situation.. Able to follow basic commands. Dense left hemiparesis.0/5 except 2-/5 L ankle PF , R side 5/5. Mild left inattention. Skin: Skin is warm and dry.     Assessment/Plan: 1. Functional deficits  secondary to Large R MCA infarct with L HP, L hemisensory def, cognitive deficits which require 3+ hours per day of interdisciplinary therapy in a comprehensive inpatient rehab setting. Physiatrist is providing close team supervision and 24 hour management of active medical problems listed below. Physiatrist and rehab team continue to assess barriers to discharge/monitor patient progress toward functional and medical goals. FIM: FIM - Bathing Bathing Steps Patient Completed: Abdomen;Right upper leg;Left upper leg Bathing: 1: Two helpers  FIM - Upper Body Dressing/Undressing Upper body dressing/undressing  steps patient completed: Thread/unthread right sleeve of pullover shirt/dresss Upper body dressing/undressing: 2: Max-Patient completed 25-49% of tasks FIM - Lower Body Dressing/Undressing Lower body dressing/undressing steps patient completed: Thread/unthread right underwear leg Lower body dressing/undressing: 1: Two helpers  FIM - Toileting Toileting: 0: Activity did not occur  FIM - Diplomatic Services operational officer Devices: Grab bars Toilet Transfers: 0-Activity did not occur  FIM - Banker Devices: Bed rails;Arm rests Bed/Chair Transfer: 2: Supine > Sit: Max A (lifting assist/Pt. 25-49%);2: Bed > Chair or W/C: Max A (lift and lower assist)  FIM - Locomotion: Wheelchair Distance: 60 Locomotion: Wheelchair: 2: Travels 50 - 149 ft with moderate assistance (Pt: 50 - 74%) FIM - Locomotion: Ambulation Locomotion: Ambulation Assistive Devices: Parallel bars Ambulation/Gait Assistance: 2: Max assist Locomotion: Ambulation: 0: Activity did not occur  Comprehension Comprehension Mode: Auditory Comprehension: 4-Understands basic 75 - 89% of the time/requires cueing 10 - 24% of the time  Expression Expression Mode: Verbal Expression: 5-Expresses basic 90% of the time/requires cueing < 10% of the time.  Social Interaction Social Interaction: 4-Interacts appropriately 75 - 89% of the time - Needs redirection for appropriate language or to initiate interaction.  Problem Solving Problem Solving: 2-Solves basic 25 - 49% of the time - needs direction more than half the time to initiate, plan or complete simple activities  Memory Memory: 4-Recognizes or recalls 75 - 89% of the time/requires cueing 10 - 24% of the time   Medical Problem List and Plan:  1. DVT Prophylaxis/Anticoagulation: Pharmaceutical: Lovenox  2. Pain Management: Was on started on NSAIDS PTA due to left foot pain. Will monitor for symptoms with increase in activity  levels. Continue prn tylenol for now.  3. Mood: patient with poor awareness of deficits. Will monitor for now. LCSW to follow for formal evaluation.  4. Neuropsych: This patient is not capable of making decisions on his/her own behalf.  5. LLL PNA: Treated with short course antibiotics. Residual mild leukocytosis 6. HTN: monitor with bid checks. Off antihypertensives at current time.  7. Focal motor seizures: continue keppra 1250 mg bid.  8. Insomnia/Hallucinations: Will discontinue seroquel as causing fatigue/daytime sedation. Will schedule trazodone at bedtime.  9. Symptomatic R-ICA stenosis: continue ASA and plavix. Neurology indicates for surgery past discharge?  LOS (Days) 4 A FACE TO FACE EVALUATION WAS PERFORMED  KIRSTEINS,ANDREW E 03/27/2012, 7:58 AM

## 2012-03-27 NOTE — Progress Notes (Signed)
Physical Therapy Note  Patient Details  Name: Shaun Pugh MRN: [id number] Date of Birth: 03/11/1951 Today's Date: 03/27/2012  0900-0940 (40 minutes) individual Pain: no complaint of pain Focus of treatment: transfer training; wc mobility training; Neuro re-ed Lt Le Treatment: Rolling to left using bed rail mod assist + tactile cues for positioning RT LE; left side to sit max assist +1; sitting edge of bed /mat initial min assist with lean to left; transfers scoot +2 assist for safety to right or left; wc mobility with max tactile cues to coordinate RT UE/LE + mod /max assist; Neuro re-ed LT LE- AA hip flexion in sidelying (gravity eliminated) with trace initiation of active hip flexion; hip flexion /extension in supine using therapy ball. Returned to room in wc with quick release belt in place.   Shaun Pugh,Shaun Pugh 03/27/2012, 7:29 AM

## 2012-03-28 ENCOUNTER — Inpatient Hospital Stay (HOSPITAL_COMMUNITY): Payer: Medicare Other

## 2012-03-28 ENCOUNTER — Inpatient Hospital Stay (HOSPITAL_COMMUNITY): Payer: Medicare Other | Admitting: Speech Pathology

## 2012-03-28 ENCOUNTER — Inpatient Hospital Stay (HOSPITAL_COMMUNITY): Payer: Medicare Other | Admitting: Physical Therapy

## 2012-03-28 DIAGNOSIS — I6340 Cerebral infarction due to embolism of unspecified cerebral artery: Secondary | ICD-10-CM

## 2012-03-28 DIAGNOSIS — G8110 Spastic hemiplegia affecting unspecified side: Secondary | ICD-10-CM

## 2012-03-28 DIAGNOSIS — I69922 Dysarthria following unspecified cerebrovascular disease: Secondary | ICD-10-CM

## 2012-03-28 MED ORDER — ESCITALOPRAM OXALATE 5 MG PO TABS
5.0000 mg | ORAL_TABLET | Freq: Every day | ORAL | Status: DC
  Administered 2012-03-28 – 2012-04-06 (×10): 5 mg via ORAL
  Filled 2012-03-28 (×14): qty 1

## 2012-03-28 NOTE — Progress Notes (Signed)
Patient ID: Shaun Pugh, male   DOB: 06/30/1950, 61 y.o.   MRN: [id number] LEXIE MORINI is a 61 y.o. RH-male with history of CAD, TIAs with left visual field deficits as well as dizziness/HA. Admitted on 03/17/12 with HA, left sided weakness and numbness with slurred speech. CT head negative and treated with tPA. CTA head/neck with hypodensity right superior gyrus, ulcerated plaque right ICA with 60% ICA stenosis, suspicion for 10mm focal thrombus proximal aortic arch. Patient developed combativeness with agitation as well as focal motor seizures LUE on 03/18/12. He was treated with ativan and started on Keppra. F/u CCT with extensive area of low density affecting cortical and subcortical right hemisphere in MCA distribution. 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 Placed on ASA and no anticoagulation due to large stroke and risk of bleeding. EEG without epileptiform activity. He did develop fevers and was started on antibiotics for LLL infiltrate. Carotid dopplers done revealing 60-79% ICA stenosis. 2D echo with EF 65-70%   Subjective/Complaints: Slept on back Constipated   Review of Systems  Constitutional: Positive for malaise/fatigue.  Respiratory: Negative for cough.   Cardiovascular: Negative for chest pain.  Neurological: Positive for sensory change, speech change and focal weakness. Negative for seizures.  All other systems reviewed and are negative.    Objective: Vital Signs: Blood pressure 146/86, pulse 78, temperature 98.1 F (36.7 C), temperature source Oral, resp. rate 18, weight 116.9 kg (257 lb 11.5 oz), SpO2 99.00%. No results found. Results for orders placed during the hospital encounter of 03/23/12 (from the past 72 hour(s))  GLUCOSE, CAPILLARY     Status: Normal   Collection Time   03/25/12 11:24 AM      Component Value Range Comment   Glucose-Capillary 98  70 - 99 mg/dL    Comment 1 Notify RN      Comment 2  Documented in Chart        Nursing note and vitals reviewed.  Constitutional: He appears well-developed and well-nourished.  HENT:  Head: Normocephalic and atraumatic.  Eyes: Pupils are equal, round, and reactive to light.  Neck: Normal range of motion. Neck supple.  Cardiovascular: Normal rate and regular rhythm.  Pulmonary/Chest: Effort normal and breath sounds normal.  Abdominal: Soft. Bowel sounds are normal.  Musculoskeletal: He exhibits no edema.  Neurological: He is alert.  Oriented to self and situation.. Able to follow basic commands. Dense left hemiparesis.0/5 except 2-/5 L ankle PF , R side 5/5. Mild left inattention. Skin: Skin is warm and dry.     Assessment/Plan: 1. Functional deficits secondary to Large R MCA infarct with L HP, L hemisensory def, cognitive deficits which require 3+ hours per day of interdisciplinary therapy in a comprehensive inpatient rehab setting. Physiatrist is providing close team supervision and 24 hour management of active medical problems listed below. Physiatrist and rehab team continue to assess barriers to discharge/monitor patient progress toward functional and medical goals. FIM: FIM - Bathing Bathing Steps Patient Completed: Abdomen;Right upper leg;Left upper leg Bathing: 1: Two helpers  FIM - Upper Body Dressing/Undressing Upper body dressing/undressing steps patient completed: Thread/unthread right sleeve of pullover shirt/dresss Upper body dressing/undressing: 2: Max-Patient completed 25-49% of tasks FIM - Lower Body Dressing/Undressing Lower body dressing/undressing steps patient completed: Thread/unthread right underwear leg Lower body dressing/undressing: 1: Two helpers  FIM - Toileting Toileting: 0: Activity did not occur  FIM - Diplomatic Services operational officer Devices: Grab bars Toilet Transfers: 0-Activity did  not occur  FIM - Bed/Chair Transfer Bed/Chair Transfer Assistive Devices: Bed rails;Arm  rests Bed/Chair Transfer: 2: Supine > Sit: Max A (lifting assist/Pt. 25-49%);2: Bed > Chair or W/C: Max A (lift and lower assist)  FIM - Locomotion: Wheelchair Distance: 60 Locomotion: Wheelchair: 2: Travels 50 - 149 ft with moderate assistance (Pt: 50 - 74%) FIM - Locomotion: Ambulation Locomotion: Ambulation Assistive Devices: Parallel bars Ambulation/Gait Assistance: 2: Max assist Locomotion: Ambulation: 0: Activity did not occur  Comprehension Comprehension Mode: Auditory Comprehension: 4-Understands basic 75 - 89% of the time/requires cueing 10 - 24% of the time  Expression Expression Mode: Verbal Expression: 5-Expresses basic 90% of the time/requires cueing < 10% of the time.  Social Interaction Social Interaction: 4-Interacts appropriately 75 - 89% of the time - Needs redirection for appropriate language or to initiate interaction.  Problem Solving Problem Solving: 2-Solves basic 25 - 49% of the time - needs direction more than half the time to initiate, plan or complete simple activities  Memory Memory: 4-Recognizes or recalls 75 - 89% of the time/requires cueing 10 - 24% of the time   Medical Problem List and Plan:  1. DVT Prophylaxis/Anticoagulation: Pharmaceutical: Lovenox  2. Pain Management: Was on started on NSAIDS PTA due to left foot pain. Will monitor for symptoms with increase in activity levels. Continue prn tylenol for now.  3. Mood: patient with poor awareness of deficits. Will monitor for now. LCSW to follow for formal evaluation. Feels down with poor appetite will trial lexapro 4. Neuropsych: This patient is not capable of making decisions on his/her own behalf.  5. LLL PNA: Treated with short course antibiotics. Residual mild leukocytosis 6. HTN: monitor with bid checks. Off antihypertensives at current time.  7. Focal motor seizures: continue keppra 1250 mg bid.  8. Insomnia/Hallucinations: Will discontinue seroquel as causing fatigue/daytime sedation.  Will schedule trazodone at bedtime.  9. Symptomatic R-ICA stenosis: continue ASA and plavix. Neurology indicates for surgery past discharge?  LOS (Days) 5 A FACE TO FACE EVALUATION WAS PERFORMED  Shaun Pugh E 03/28/2012, 7:41 AM

## 2012-03-28 NOTE — Progress Notes (Signed)
The skilled treatment note has been reviewed and SLP is in agreement. Leyli Kevorkian, M.A., CCC-SLP [phone]  

## 2012-03-28 NOTE — Progress Notes (Signed)
Occupational Therapy Session Note  Patient Details  Name: Shaun Pugh MRN: [id number] Date of Birth: 11-Jul-1950  Today's Date: 03/28/2012 Time: 0730-0830 Time Calculation (min): 60 min  Short Term Goals: Week 1:  OT Short Term Goal 1 (Week 1): Patient will perform grooming with Mod Assist using LUE as stabilizer. OT Short Term Goal 2 (Week 1): Patient will perform bating with Mod Assist. OT Short Term Goal 3 (Week 1): Patient will perform dressing with Max Assist. OT Short Term Goal 4 (Week 1): Patient will increase sitting balance from Poor- to Poor+ to increase functional performance during bathing and dressing. OT Short Term Goal 5 (Week 1): Patient will increase toilet transfer to Max Assist.  Skilled Therapeutic Interventions/Progress Updates:    ADL re-training completed at sink with wife present observing. Session with focus on ADL performance, functional t/f, sitting and standing balance, left side awareness. Patient able to go from left sidelying to sit with Max Assist! 2 person transfer with max vc's for technique and safety. Once patient was in w/c he immediately wanted to return to bed.  Patient educated on hemi-dressing technique when donning t-shirt; Max diff; Max vc's for technique w/ increased assistance. During session, pt presented with gaze preference: head turned, gaze down and to the right. Therapist stood on left side of patient and continuously tried to engage patient on left side and encouraged him to look to the left as much as possible. Pt would not hold gaze to left side for longer than 15 seconds. 2 person needed for standing at sink for LB bathing and dressing. Pt did actively help pull brief and pants up on right side. -performed scapular mobilization exercises while seated on EOB; Left UE.    Therapy Documentation Precautions:  Precautions Precautions: Fall Precaution Comments: Left sided weakness and lack of attention  Restrictions Weight Bearing  Restrictions: No Pain: Pain Assessment Pain Assessment: No/denies pain  See FIM for current functional status  Therapy/Group: Individual Therapy  Limmie Patricia, OTR/L 03/28/2012, 10:08 AM

## 2012-03-28 NOTE — Progress Notes (Signed)
Pt eating only a few bites from meals today, pt states" I am not hungry"

## 2012-03-28 NOTE — Progress Notes (Signed)
Speech Language Pathology Daily Session Note  Patient Details  Name: Shaun Pugh MRN: [id number] Date of Birth: 04-Jul-1950  Today's Date: 03/28/2012 Time: 1100-1145 Time Calculation (min): 45 min  Short Term Goals: Week 1: SLP Short Term Goal 1 (Week 1): Patient will utilize small bites/sips and slow rate while consuming regular textures and thin liquids with mod A multimodal cuing. SLP Short Term Goal 2 (Week 1): Patient will solve basic problems during functional tasks with mod A visual and verbal cueing. SLP Short Term Goal 3 (Week 1): Patient will attend to the left sided during basic functional tasks with mod A verbal cuing.   SLP Short Term Goal 4 (Week 1): Patient will demonstrate emergent awareness by asking for help when needed with max A verbal cueing.   SLP Short Term Goal 5 (Week 1): Patient will alternate attention between the clinician and a basic functional task with mod A verbal and visual cues for redirection. SLP Short Term Goal 6 (Week 1): Patient will utilize external aids to facilitate recall of new, daily information with max A multimodal cueing.    Skilled Therapeutic Interventions: Treatment addressed cognitive goals.  Patient was extremely fatigued and lethargic due to multiple therapy sessions this morning and needed encouragement to participate.   As a result, SLP recommends scheduling patient for AM meal tray tomorrow to monitor dysphagia management and further functionally assess patient's cognition when less fatigued.  SLP facilitated session with max to total assist multimodal cues for scanning to the left and basic problem solving during a functional task.  Patient demonstrated sustained attention for periods of approximately 1 minute with mod to max assist verbal and visual cues for redirection.      FIM:  Comprehension Comprehension Mode: Auditory Comprehension: 3-Understands basic 50 - 74% of the time/requires cueing 25 - 50%  of the  time Expression Expression Mode: Verbal Expression: 3-Expresses basic 50 - 74% of the time/requires cueing 25 - 50% of the time. Needs to repeat parts of sentences. Social Interaction Social Interaction: 2-Interacts appropriately 25 - 49% of time - Needs frequent redirection. Problem Solving Problem Solving: 2-Solves basic 25 - 49% of the time - needs direction more than half the time to initiate, plan or complete simple activities Memory Memory: 3-Recognizes or recalls 50 - 74% of the time/requires cueing 25 - 49% of the time  Pain Pain Assessment Pain Assessment: No/denies pain  Therapy/Group: Individual Therapy  Jackalyn Lombard, Conrad Woodloch  Graduate Clinician Speech Language Pathology  Lenor Provencher, Joni Reining 03/28/2012, 1:11 PM

## 2012-03-28 NOTE — Progress Notes (Signed)
Physical Therapy Note  Patient Details  Name: Shaun Pugh MRN: [id number] Date of Birth: [birth year] Today's Date: 03/28/2012  1000-1045 (45 minutes) individual Pain: no complaint of pain Focus of treatment: wc mobility training; Neuro re-ed LT LE; standing to facilitate trunk / LT knee extension in weightbearing Treatment: Pt up in wc upon arrival ; wc mobility- min/mod assist + mod vcs to use RT UE/ LE for propulsion 120 feet; transfers max assist + second person for safety during scoot to squat /pivot transfer wc >< mat; sit to supine (mat) mod assist trunk, LT LE; supine to side (left) to sit max assist +1; Neuro re-ed LT LE hip flexion AA  gravity eliminated using suspension grid.(trace); supine hip abduction using suspension grid gravity eliminated (trace); pt able to assist with manual leg press in supine; standing to SARA + 4-5 minutes X 2 with reported increased dizziness . No c/o dizziness in sitting.     1478-2956 (40 minutes) individual Pain: no complaint of pain Focus of treatment: Neuro re-ed LT LE; transfer training Treatment: supine to sit max assist (pt 20 % using bedrail) ;transfer scoot to squat/pivot max assist + assist of second person for safety securing wc to right with pt attempting to assist using armrest (pt.15%); sit to supine (mat) mod/max assist ; Neuro re-ed AA left hip flexion in sidelying with mod/max vcs to use mirror for feedback; supine bilateral hip flexion/extension using therapy ball with manual resistance to RT LE.   [name],JIM 03/28/2012, 7:23 AM

## 2012-03-28 NOTE — Progress Notes (Signed)
Patient evaluated for long-term disease management services with [facility] as a benefit of his [employer]. Met with wife at bedside to explain [facility] Care Management services and that [name] will receive a post discharge transition of care call and will be evaluated for monthly home visits for assessments and for education. Left [facility] literature and contact information.  Shaun Noble, RN,BSN, MSN  [facility] Liaison [phone]

## 2012-03-29 ENCOUNTER — Inpatient Hospital Stay (HOSPITAL_COMMUNITY): Payer: Medicare Other

## 2012-03-29 ENCOUNTER — Inpatient Hospital Stay (HOSPITAL_COMMUNITY): Payer: Medicare Other | Admitting: Speech Pathology

## 2012-03-29 ENCOUNTER — Inpatient Hospital Stay (HOSPITAL_COMMUNITY): Payer: Medicare Other | Admitting: Occupational Therapy

## 2012-03-29 DIAGNOSIS — I6340 Cerebral infarction due to embolism of unspecified cerebral artery: Secondary | ICD-10-CM

## 2012-03-29 DIAGNOSIS — G8110 Spastic hemiplegia affecting unspecified side: Secondary | ICD-10-CM

## 2012-03-29 DIAGNOSIS — I69922 Dysarthria following unspecified cerebrovascular disease: Secondary | ICD-10-CM

## 2012-03-29 NOTE — Progress Notes (Signed)
Speech Language Pathology Daily Session Note  Patient Details  Name: Shaun Pugh MRN: [id number] Date of Birth: 19-Jan-1951  Today's Date: 03/29/2012 Time: 0830-0920 Time Calculation (min): 50 min  Short Term Goals: Week 1: SLP Short Term Goal 1 (Week 1): Patient will utilize small bites/sips and slow rate while consuming regular textures and thin liquids with mod A multimodal cuing. SLP Short Term Goal 2 (Week 1): Patient will solve basic problems during functional tasks with mod A visual and verbal cueing. SLP Short Term Goal 3 (Week 1): Patient will attend to the left sided during basic functional tasks with mod A verbal cuing.   SLP Short Term Goal 4 (Week 1): Patient will demonstrate emergent awareness by asking for help when needed with max A verbal cueing.   SLP Short Term Goal 5 (Week 1): Patient will alternate attention between the clinician and a basic functional task with mod A verbal and visual cues for redirection. SLP Short Term Goal 6 (Week 1): Patient will utilize external aids to facilitate recall of new, daily information with max A multimodal cueing.    Skilled Therapeutic Interventions: Treatment addressed cognitive goals.  Patient sustained attention to a basic familiar task for periods of approximately 1 minute with mod assist verbal and visual cues for redirection.  Patient tolerated regular textures and thin liquids with min-mod assist visual and verbal cues for utilization of lingual sweep to clear right pocketing and to attend to bolus with no overt s/s of aspiration.  SLP also facilitated the session with mod-max assist multimodal cuing for initiation and mod-min assist visual and verbal cues for completing a basic functional task (i.e. coin sorting).    Overall, patient presented with less fatigue, and, as a result, exhibited increased participation in therapy.  Patient still requires overall mod assist multimodal cuing for motivation and participation.     FIM:    Comprehension Comprehension Mode: Auditory Comprehension: 4-Understands basic 75 - 89% of the time/requires cueing 10 - 24% of the time Expression Expression Mode: Verbal Expression: 4-Expresses basic 75 - 89% of the time/requires cueing 10 - 24% of the time. Needs helper to occlude trach/needs to repeat words. Social Interaction Social Interaction: 2-Interacts appropriately 25 - 49% of time - Needs frequent redirection. Problem Solving Problem Solving: 3-Solves basic 50 - 74% of the time/requires cueing 25 - 49% of the time Memory Memory: 3-Recognizes or recalls 50 - 74% of the time/requires cueing 25 - 49% of the time FIM - Eating Eating Activity: 5: Set-up assist for open containers;5: Supervision/cues  Pain Pain Assessment Pain Assessment: No/denies pain  Therapy/Group: Individual Therapy  Jackalyn Lombard, Conrad Kendall  Graduate Clinician Speech Language Pathology  Jacklyne Baik, Joni Reining 03/29/2012, 9:33 AM

## 2012-03-29 NOTE — Progress Notes (Signed)
Occupational Therapy Session Note  Patient Details  Name: Shaun Pugh MRN: [id number] Date of Birth: 08-13-1950  Today's Date: 03/29/2012 Time: 4098-1191 Time Calculation (min): 30 min  Short Term Goals: Week 1:  OT Short Term Goal 1 (Week 1): Patient will perform grooming with Mod Assist using LUE as stabilizer. OT Short Term Goal 2 (Week 1): Patient will perform bating with Mod Assist. OT Short Term Goal 3 (Week 1): Patient will perform dressing with Max Assist. OT Short Term Goal 4 (Week 1): Patient will increase sitting balance from Poor- to Poor+ to increase functional performance during bathing and dressing. OT Short Term Goal 5 (Week 1): Patient will increase toilet transfer to Max Assist.  Skilled Therapeutic Interventions/Progress Updates:  Patient reclined all the way back in recliner with feet up and QRB in place upon arrival.  Engaged in LUE PROM and SROM exercises, visual attention to left, recline>sit upright, postural control, sit><squat with focus on forward and right lateral weight shifts, and educated patient on subluxation of left shoulder and Pusher Syndrome tendencies.  Assisted patient to recline position and QRB applied.   Therapy Documentation Precautions:  Precautions Precautions: Fall Precaution Comments: Left sided weakness and lack of attention  Restrictions Weight Bearing Restrictions: No Pain: Pain Assessment Pain Assessment: No/denies pain  See FIM for current functional status  Therapy/Group: Individual Therapy  [name] 03/29/2012, 5:49 PM

## 2012-03-29 NOTE — Progress Notes (Signed)
The skilled treatment note has been reviewed and SLP is in agreement. Dailee Manalang, M.A., CCC-SLP [phone]  

## 2012-03-29 NOTE — Progress Notes (Addendum)
Patient ID: Shaun Pugh, male   DOB: [birth year], 61 y.o.   MRN: [id number] Shaun Pugh is a 61 y.o. RH-male with history of CAD, TIAs with left visual field deficits as well as dizziness/HA. Admitted on 03/17/12 with HA, left sided weakness and numbness with slurred speech. CT head negative and treated with tPA. CTA head/neck with hypodensity right superior gyrus, ulcerated plaque right ICA with 60% ICA stenosis, suspicion for 10mm focal thrombus proximal aortic arch. Patient developed combativeness with agitation as well as focal motor seizures LUE on 03/18/12. He was treated with ativan and started on Keppra. F/u CCT with extensive area of low density affecting cortical and subcortical right hemisphere in MCA distribution. 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 Placed on ASA and no anticoagulation due to large stroke and risk of bleeding. EEG without epileptiform activity. He did develop fevers and was started on antibiotics for LLL infiltrate. Carotid dopplers done revealing 60-79% ICA stenosis. 2D echo with EF 65-70%   Subjective/Complaints: L shoulder pain Constipated   Review of Systems  Constitutional: Positive for malaise/fatigue.  Respiratory: Negative for cough.   Cardiovascular: Negative for chest pain.  Neurological: Positive for sensory change, speech change and focal weakness. Negative for seizures.  All other systems reviewed and are negative.    Objective: Vital Signs: Blood pressure 136/77, pulse 76, temperature 98 F (36.7 C), temperature source Oral, resp. rate 18, weight 116.9 kg (257 lb 11.5 oz), SpO2 99.00%. No results found. No results found for this or any previous visit (from the past 72 hour(s)).   Nursing note and vitals reviewed.  Constitutional: He appears well-developed and well-nourished.  HENT:  Head: Normocephalic and atraumatic.  Eyes: Pupils are equal, round, and reactive to light.  Neck:  Normal range of motion. Neck supple.  Cardiovascular: Normal rate and regular rhythm.  Pulmonary/Chest: Effort normal and breath sounds normal.  Abdominal: Soft. Bowel sounds are normal.  Musculoskeletal: He exhibits no edema.  Neurological: He is alert.  Oriented to self and situation.. Able to follow basic commands. Dense left hemiparesis.0/5 except 2-/5 L ankle PF , R side 5/5. Mild left inattention. Skin: Skin is warm and dry.  1/2 FB sublux on L, + Hawkins at 95 deg   Assessment/Plan: 1. Functional deficits secondary to Large R MCA infarct with L HP, L hemisensory def, cognitive deficits which require 3+ hours per day of interdisciplinary therapy in a comprehensive inpatient rehab setting. Physiatrist is providing close team supervision and 24 hour management of active medical problems listed below. Physiatrist and rehab team continue to assess barriers to discharge/monitor patient progress toward functional and medical goals. FIM: FIM - Bathing Bathing Steps Patient Completed: Chest;Right Arm;Left Arm;Abdomen;Front perineal area;Buttocks;Right upper leg;Left upper leg;Right lower leg (including foot);Left lower leg (including foot) Bathing: 1: Two helpers  FIM - Upper Body Dressing/Undressing Upper body dressing/undressing steps patient completed: Thread/unthread right sleeve of pullover shirt/dresss;Thread/unthread left sleeve of pullover shirt/dress;Put head through opening of pull over shirt/dress;Pull shirt over trunk Upper body dressing/undressing: 2: Max-Patient completed 25-49% of tasks FIM - Lower Body Dressing/Undressing Lower body dressing/undressing steps patient completed: Thread/unthread right pants leg;Thread/unthread left pants leg;Pull pants up/down;Don/Doff right sock;Don/Doff left sock;Don/Doff right shoe;Don/Doff left shoe;Fasten/unfasten right shoe;Fasten/unfasten left shoe Lower body dressing/undressing: 1: Two helpers  FIM - Toileting Toileting: 0: Activity did  not occur  FIM - Diplomatic Services operational officer Devices: Grab bars Toilet Transfers: 0-Activity did not occur  FIM -  Bed/Chair Transport planner Devices: Bed rails;Arm rests Bed/Chair Transfer: 2: Supine > Sit: Max A (lifting assist/Pt. 25-49%);2: Bed > Chair or W/C: Max A (lift and lower assist)  FIM - Locomotion: Wheelchair Distance: 60 Locomotion: Wheelchair: 2: Travels 50 - 149 ft with moderate assistance (Pt: 50 - 74%) FIM - Locomotion: Ambulation Locomotion: Ambulation Assistive Devices: Parallel bars Ambulation/Gait Assistance: 2: Max assist Locomotion: Ambulation: 0: Activity did not occur  Comprehension Comprehension Mode: Auditory Comprehension: 3-Understands basic 50 - 74% of the time/requires cueing 25 - 50%  of the time  Expression Expression Mode: Verbal Expression: 3-Expresses basic 50 - 74% of the time/requires cueing 25 - 50% of the time. Needs to repeat parts of sentences.  Social Interaction Social Interaction: 2-Interacts appropriately 25 - 49% of time - Needs frequent redirection.  Problem Solving Problem Solving: 2-Solves basic 25 - 49% of the time - needs direction more than half the time to initiate, plan or complete simple activities  Memory Memory: 3-Recognizes or recalls 50 - 74% of the time/requires cueing 25 - 49% of the time   Medical Problem List and Plan:  1. DVT Prophylaxis/Anticoagulation: Pharmaceutical: Lovenox  2. Pain Management: Was on started on NSAIDS PTA due to left foot pain. Will monitor for symptoms with increase in activity levels. Continue prn tylenol for now.  3. Mood: patient with poor awareness of deficits. Will monitor for now. LCSW to follow for formal evaluation. Feels down with poor appetite will trial lexapro 4. Neuropsych: This patient is not capable of making decisions on his/her own behalf.  5. LLL PNA: Treated with short course antibiotics. Residual mild leukocytosis 6. HTN: monitor  with bid checks. Off antihypertensives at current time.  7. Focal motor seizures: continue keppra 1250 mg bid.  8. Insomnia/Hallucinations: Will discontinue seroquel as causing fatigue/daytime sedation. Will schedule trazodone at bedtime.  9. Symptomatic R-ICA stenosis: continue ASA and plavix. Neurology indicates for surgery past discharge? 10.  L Hemiplegic shoulder pain support, positioning cont OT, start meds if worsening  LOS (Days) 6 A FACE TO FACE EVALUATION WAS PERFORMED  KIRSTEINS,ANDREW E 03/29/2012, 7:50 AM

## 2012-03-29 NOTE — Progress Notes (Signed)
Physical Therapy Session Note  Patient Details  Name: Shaun Pugh MRN: [id number] Date of Birth: Mar 05, 1951  Today's Date: 03/29/2012 Time: 1478-2956 Time Calculation (min): 40 min  Short Term Goals: Week 1:  PT Short Term Goal 1 (Week 1): Pt will move supine > sit with min assist from R. PT Short Term Goal 2 (Week 1): Pt will perform transfer bed><w/c with mod assist. PT Short Term Goal 3 (Week 1): Pt will stand x 5 minutes during UE task at table,  with assist of 1 person PT Short Term Goal 4 (Week 1): Pt will propel appropriate- height w/c x 50' with mod assist.  Skilled Therapeutic Interventions/Progress Updates:  Patient in recliner upon entering room. Patient transferred from recliner to wheelchair with +1 max assist. Patient requires assistance with lifting and lowering slowly. Patient sit to stand and ambulated 10 feet and 15 feet with Carley Hammed walker and +2 assist (+1 assist to guide walker and +1 assist for postural control and to facilitate gait). Patient requires total assist to progress left LE forward. Patient also needs facilitation of knee extension during stance on left. Patient has flexed posture as he tends to look at his feet during ambulation. Patient transferred wheelchair to mat with max assist. Patient sit to supine with min assist for left LE only. Treatment consisted of facilitation of left hip abduction, adduction, and extension through resistance of right LE. Patient transferred back to wheelchair going to right (strong side) with max assist. Patient returned to room and back to recliner.    Therapy Documentation Precautions:  Precautions Precautions: Fall Precaution Comments: Left sided weakness and lack of attention  Restrictions Weight Bearing Restrictions: No  Pain: Pain Assessment Pain Assessment: No/denies pain  Locomotion : Ambulation Ambulation/Gait Assistance: 1: +2 Total assist   See FIM for current functional status  Therapy/Group: Individual  Therapy  Alma Friendly 03/29/2012, 1:51 PM

## 2012-03-29 NOTE — Progress Notes (Signed)
Occupational Therapy Session Note  Patient Details  Name: Shaun Pugh MRN: [id number] Date of Birth: 02-Mar-1951  Today's Date: 03/29/2012 Time: 0730-0830 Time Calculation (min): 60 min  Short Term Goals: Week 1:  OT Short Term Goal 1 (Week 1): Patient will perform grooming with Mod Assist using LUE as stabilizer. OT Short Term Goal 2 (Week 1): Patient will perform bating with Mod Assist. OT Short Term Goal 3 (Week 1): Patient will perform dressing with Max Assist. OT Short Term Goal 4 (Week 1): Patient will increase sitting balance from Poor- to Poor+ to increase functional performance during bathing and dressing. OT Short Term Goal 5 (Week 1): Patient will increase toilet transfer to Max Assist.  Skilled Therapeutic Interventions/Progress Updates:    ADL re-training completed in shower this AM with rolling shower chair. Session with focus on ADL performance, functional t/f, sitting and standing balance, left side awareness. Patient able to go from left sidelying to sit with Max Assist.  2 person transfer with max vc's for technique and safety to shower chair.  Patient continues to be educated on hemi-dressing technique when donning t-shirt; Max diff; Max vc's for technique w/ increased assistance.2 person needed for standing at sink for LB bathing and dressing.  Pt presents with lack of motivation to participate during ADL as seen during transitioning from supine to sit in bed. When asked to roll and sit up, patient continuously stated, "no." When donning shirt, patient refuse to attention to left side and push shirt over left shoulder.      Therapy Documentation Precautions:  Precautions Precautions: Fall Precaution Comments: Left sided weakness and lack of attention  Restrictions Weight Bearing Restrictions: No Pain: Pain Assessment Pain Assessment: No/denies pain  See FIM for current functional status  Therapy/Group: Individual Therapy  Limmie Patricia, OTR/L 03/29/2012,  9:02 AM

## 2012-03-30 ENCOUNTER — Inpatient Hospital Stay (HOSPITAL_COMMUNITY): Payer: Medicare Other

## 2012-03-30 ENCOUNTER — Inpatient Hospital Stay (HOSPITAL_COMMUNITY): Payer: Medicare Other | Admitting: Speech Pathology

## 2012-03-30 ENCOUNTER — Inpatient Hospital Stay (HOSPITAL_COMMUNITY): Payer: Medicare Other | Admitting: Physical Therapy

## 2012-03-30 LAB — CREATININE, SERUM
Creatinine, Ser: 0.94 mg/dL (ref 0.50–1.35)
GFR calc Af Amer: >90 mL/min (ref >90)
GFR calc non Af Amer: 88 mL/min — ABNORMAL LOW (ref >90)

## 2012-03-30 MED ORDER — HYDROCHLOROTHIAZIDE 12.5 MG PO CAPS
12.5000 mg | ORAL_CAPSULE | Freq: Every day | ORAL | Status: DC
  Administered 2012-03-30 – 2012-04-01 (×3): 12.5 mg via ORAL
  Filled 2012-03-30 (×6): qty 1

## 2012-03-30 NOTE — Progress Notes (Signed)
Occupational Therapy Session Note  Patient Details  Name: Shaun Pugh MRN: [id number] Date of Birth: 1950/12/17  Today's Date: 03/30/2012 Time: 0730-0830 Time Calculation (min): 60 min  Short Term Goals: Week 1:  OT Short Term Goal 1 (Week 1): Patient will perform grooming with Mod Assist using LUE as stabilizer. OT Short Term Goal 2 (Week 1): Patient will perform bating with Mod Assist. OT Short Term Goal 3 (Week 1): Patient will perform dressing with Max Assist. OT Short Term Goal 4 (Week 1): Patient will increase sitting balance from Poor- to Poor+ to increase functional performance during bathing and dressing. OT Short Term Goal 5 (Week 1): Patient will increase toilet transfer to Max Assist.  Skilled Therapeutic Interventions/Progress Updates:    ADL re-training completed in shower this AM with rolling shower chair. Session with focus on ADL performance, functional t/f, sitting and standing balance, left side awareness.  2 person transfer with max vc's for technique and safety to shower chair.  Patient continues to be educated on hemi-dressing technique when donning t-shirt; Max diff; Max vc's for technique w/ increased assistance. 2 person needed for standing at sink for LB bathing and dressing. Patient did stand with only 1 person for assist with balance while 2nd person pulled pants up. Patient did request to use toilet when needing to have a BM.       Therapy Documentation Precautions:  Precautions Precautions: Fall Precaution Comments: Left sided weakness, right gaze preference, left inattention/neglect, left shoulder subluxation, Pusher tendencies Restrictions Weight Bearing Restrictions: No Pain: Pain Assessment Pain Assessment: No/denies pain  See FIM for current functional status  Therapy/Group: Individual Therapy  Limmie Patricia, OTR/L 03/30/2012, 10:52 AM

## 2012-03-30 NOTE — Progress Notes (Signed)
Physical Therapy Note  Patient Details  Name: Shaun Pugh MRN: [id number] Date of Birth: 1950/10/01 Today's Date: 03/30/2012  1030-1055 (25 minutes) individual Pain: no complaint of pain Focus of treatment: Neuro re-ed LT LE Treatment: wc mobility min assist using RT extremities with max vcs to attend to left ; transfers scoot to squat/pivot mod assist with max assist for wc setup; sit to supine mod assist; supine to sit with max vcs to roll to side and mod/max assist to sit from left; Neuro re-ed LT LE- sidelying gravity eliminated AA hip flexion /extension; supine hip flexion/extension (pt now initiating antigravity hip flexion); bridging ; AA hip abduction; sit to stand from mat mod assist; standing to WC mod assist blocking Lt knee.    Shaun Pugh,JIM 03/30/2012, 7:32 AM

## 2012-03-30 NOTE — Progress Notes (Signed)
Physical Therapy Weekly Progress Note  Patient Details  Name: Shaun Pugh MRN: [id number] Date of Birth: 1950/05/12  Today's Date: 03/30/2012 Time: 1191-4782 Time Calculation (min): 40 min  Patient has met 2 of 4 short term goals.  Pt's decreased activity tolerance, L neglect, limited motor return LLE limited his progress on STGs.  Patient continues to demonstrate the following deficits: strength, balance, motor return LLE, L neglect, decreased activity tolerance and therefore will continue to benefit from skilled PT intervention to enhance overall performance with activity tolerance, balance, postural control, ability to compensate for deficits, functional use of  left upper extremity and left lower extremity, attention and awareness.  Patient progressing toward long term goals..  Continue plan of care.  PT Short Term Goals Week 1:  PT Short Term Goal 1 (Week 1): Pt will move supine > sit with min assist from R. PT Short Term Goal 1 - Progress (Week 1): Met PT Short Term Goal 2 (Week 1): Pt will perform transfer bed><w/c with mod assist. PT Short Term Goal 2 - Progress (Week 1): Not met PT Short Term Goal 3 (Week 1): Pt will stand x 5 minutes during UE task at table,  with assist of 1 person PT Short Term Goal 3 - Progress (Week 1): Not met PT Short Term Goal 4 (Week 1): Pt will propel appropriate- height w/c x 50' with mod assist. PT Short Term Goal 4 - Progress (Week 1): Met  Skilled Therapeutic Interventions/Progress Updates:  Pt reclined in supine in recliner.  Recliner > w/c to R squat pivot with max assist.  W/c mobility using hemi method of RUE and RLE, mod assist to steer and stay on R side of hall, avoid obstacles on L.  W/c>< mat squat pivot to L and R with max assist, focusing on pivoting LLE.    neuromuscular re-education via forced use, visual cues, VCs for: -dynamic sitting balance, reaching to L and R with R hand, focusing on turning head to compensate for visual  deficits, facilitating trunk shortening/lengthening with wt shifting.  Pt required min cues to attend to L for placing small cones in pyramid shape. -sit> stand at EVA walker with max assist, L AFO.  Pt unable to fully extend trunk or bear wt on LLE; LLE hip rotated outward and knee buckled; attempted x 3.  Pt reported he did not walk with trunk flexion PTA, despite 4 low back surgeries.  Therapy Documentation Precautions:  Precautions Precautions: Fall Precaution Comments: Left sided weakness, right gaze preference, left inattention/neglect, left shoulder subluxation, Pusher tendencies  Restrictions Weight Bearing Restrictions: No   Pain: Pain Assessment Pain Assessment: No/denies pain      See FIM for current functional status  Therapy/Group: Individual Therapy  Lucretia Pendley 03/30/2012, 3:38 PM

## 2012-03-30 NOTE — Progress Notes (Signed)
The skilled treatment note has been reviewed and SLP is in agreement. Shanty Ginty, M.A., CCC-SLP [phone]  

## 2012-03-30 NOTE — Patient Care Conference (Signed)
Inpatient RehabilitationTeam Conference Note Date: 03/30/2012   Time: 10:45 AM    Patient Name: Shaun Pugh      Medical Record Number: [id number]  Date of Birth: 11-Feb-1951 Sex: Male         Room/Bed: 4028/4028-01 Payor Info: Payor: [employer]  Plan: BLUE MEDICARE  Product Type: *No Product type*     Admitting Diagnosis: CVA  Admit Date/Time:  03/23/2012  6:54 PM Admission Comments: No comment available   Primary Diagnosis:  Stroke, acute, embolic Principal Problem: Stroke, acute, embolic  Patient Active Problem List   Diagnosis Date Noted  . Cytotoxic cerebral edema 03/19/2012  . Aortic thromboembolism 03/19/2012  . Stroke, acute, embolic, right 03/18/2012  . Seizure 03/18/2012  . Stented coronary artery   . Hemiplegia, unspecified, affecting nondominant side 03/17/2012  . Atrophy of right kidney 09/15/2011  . Kidney calculi 09/15/2011  . Acute pancreatitis 09/14/2011  . Cholelithiasis 09/14/2011  . Hyperglycemia 09/14/2011  . Current every day smoker 09/14/2011    Expected Discharge Date: Expected Discharge Date: 04/20/12  Team Members Present: Physician: Dr. Claudette Laws Social Worker Present: Dossie Der, LCSW Nurse Present: Other (comment) Charisse March Hicks_RN) PT Present: Edman Circle, Lillie Columbia, PT OT Present: Bretta Bang, Verlene Mayer, OT SLP Present: Fae Pippin, SLP Other (Discipline and Name): Barkley Boards manager & Charolette Child Coordinator     Current Status/Progress Goal Weekly Team Focus  Medical   staying up in [facility] with wife, condom cath at noc, poor endurance vs motivation  Improve bladder continence  timed toileting program   Bowel/Bladder   Cont of bowel, wear condom cath at night. LBM 11/17  continent of bowel and bladder  cont of bowel and bladder   Swallow/Nutrition/ Hydration   regular textures, thin liquids full supervision during meals  least restrictive p.o. intake  carryover of safe  swallowing strategies   ADL's   Patient is currently Min Assist for grooming; Mod Assist for bathing; Total A for dressing/toileting/functional transfers. 2 person required for transfer and for sit to stand during ADL. Decreased motivation. Flaccid LUE. Left side physical and visual neglect.  Min Assist  Family education, ADL performance, functional transfers, left awareness   Mobility   min/mod assist w/c propulsion x 120'  min assist transfers, gait x 25', up /down 3 steps; supervision bed mobility and w/c mobility x 150'  transfers, gait training, neuro re-ed   Communication   Mod A for comprehension of basic information  Min A-supervision for verbal expression of basic needs and wants and comprehension of basic and complex information  increase sustained attention during functional tasks   Safety/Cognition/ Behavioral Observations  Mod-Max A for sustained attention during functional tasks for periods of approximately 1 minute  Min A  facilitate increased sustained attention during functional tasks   Pain   Acetaminophen 325-650 mg PRN  remain free of pain  remain free of pain   Skin    No skin issue  free of new skin breakdown  free of new skin breakdown      *See Interdisciplinary Assessment and Plan and progress notes for long and short-term goals  Barriers to Discharge: Heavy care, max assist    Possible Resolutions to Barriers:  Improve function to min A train wife    Discharge Planning/Teaching Needs:  Home with wife who can provde light assistance.  Looking for place that is accessible for discharge      Team Discussion:  Making progress, physical  and visual neglect and attention deficits.begun Lexapro-due to depression, neuro-psych to see.  Wants to get in bed a lot, try to keep up in chair  Revisions to Treatment Plan:  None   Continued Need for Acute Rehabilitation Level of Care: The patient requires daily medical management by a physician with specialized training in  physical medicine and rehabilitation for the following conditions: Daily direction of a multidisciplinary physical rehabilitation program to ensure safe treatment while eliciting the highest outcome that is of practical value to the patient.: Yes Daily medical management of patient stability for increased activity during participation in an intensive rehabilitation regime.: Yes Daily analysis of laboratory values and/or radiology reports with any subsequent need for medication adjustment of medical intervention for : Neurological problems  Lucy Chris 03/30/2012, 2:44 PM

## 2012-03-30 NOTE — Progress Notes (Signed)
Speech Language Pathology Daily Session Note  Patient Details  Name: Shaun Pugh MRN: [id number] Date of Birth: 01/23/1951  Today's Date: 03/30/2012 Time: 1191-4782 Time Calculation (min): 55 min  Short Term Goals: Week 1: SLP Short Term Goal 1 (Week 1): Patient will utilize small bites/sips and slow rate while consuming regular textures and thin liquids with mod A multimodal cuing. SLP Short Term Goal 2 (Week 1): Patient will solve basic problems during functional tasks with mod A visual and verbal cueing. SLP Short Term Goal 3 (Week 1): Patient will attend to the left sided during basic functional tasks with mod A verbal cuing.   SLP Short Term Goal 4 (Week 1): Patient will demonstrate emergent awareness by asking for help when needed with max A verbal cueing.   SLP Short Term Goal 5 (Week 1): Patient will alternate attention between the clinician and a basic functional task with mod A verbal and visual cues for redirection. SLP Short Term Goal 6 (Week 1): Patient will utilize external aids to facilitate recall of new, daily information with max A multimodal cueing.    Skilled Therapeutic Interventions: Treatment addressed cognitive goals. Patient sustained attention to a basic familiar task for periods of approximately 1 minute with mod assist verbal and visual cues for redirection. Patient tolerated regular textures and thin liquids with min assist visual and verbal cues for utilization of lingual sweep to clear right pocketing and to attend to bolus with no overt s/s of aspiration. SLP also facilitated the session with visual aid and max assist multimodal cuing for initiation and mod-max assist visual and verbal cues for completing a basic functional task (i.e. replicating shapes in images with PVC pipes).  Patient continues with decreased motivation to participate in therapy and frequently attempts avoiding therapeutic tasks with humor.     FIM:  Comprehension Comprehension Mode:  Auditory Comprehension: 4-Understands basic 75 - 89% of the time/requires cueing 10 - 24% of the time Expression Expression Mode: Verbal Expression: 4-Expresses basic 75 - 89% of the time/requires cueing 10 - 24% of the time. Needs helper to occlude trach/needs to repeat words. Social Interaction Social Interaction: 2-Interacts appropriately 25 - 49% of time - Needs frequent redirection. Problem Solving Problem Solving: 2-Solves basic 25 - 49% of the time - needs direction more than half the time to initiate, plan or complete simple activities Memory Memory: 3-Recognizes or recalls 50 - 74% of the time/requires cueing 25 - 49% of the time FIM - Eating Eating Activity: 5: Supervision/cues  Pain Pain Assessment Pain Assessment: No/denies pain Pain Score: 0-No pain  Therapy/Group: Individual Therapy  Jackalyn Lombard, [place]  Graduate Clinician Speech Language Pathology  Rise Traeger, Joni Reining 03/30/2012, 11:13 AM

## 2012-03-30 NOTE — Progress Notes (Signed)
Patient ID: YERIEL MINEO, male   DOB: Sep 26, 1950, 61 y.o.   MRN: [id number] JEFFEREY LIPPMANN is a 61 y.o. RH-male with history of CAD, TIAs with left visual field deficits as well as dizziness/HA. Admitted on 03/17/12 with HA, left sided weakness and numbness with slurred speech. CT head negative and treated with tPA. CTA head/neck with hypodensity right superior gyrus, ulcerated plaque right ICA with 60% ICA stenosis, suspicion for 10mm focal thrombus proximal aortic arch. Patient developed combativeness with agitation as well as focal motor seizures LUE on 03/18/12. He was treated with ativan and started on Keppra. F/u CCT with extensive area of low density affecting cortical and subcortical right hemisphere in MCA distribution. 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 Placed on ASA and no anticoagulation due to large stroke and risk of bleeding. EEG without epileptiform activity. He did develop fevers and was started on antibiotics for LLL infiltrate. Carotid dopplers done revealing 60-79% ICA stenosis. 2D echo with EF 65-70%   Subjective/Complaints: Poor appetite Participates in therapy  Review of Systems  Constitutional: Positive for malaise/fatigue.  Respiratory: Negative for cough.   Cardiovascular: Negative for chest pain.  Neurological: Positive for sensory change, speech change and focal weakness. Negative for seizures.  All other systems reviewed and are negative.    Objective: Vital Signs: Blood pressure 170/90, pulse 72, temperature 98.2 F (36.8 C), temperature source Axillary, resp. rate 18, weight 116.9 kg (257 lb 11.5 oz), SpO2 97.00%. No results found. Results for orders placed during the hospital encounter of 03/23/12 (from the past 72 hour(s))  CREATININE, SERUM     Status: Abnormal   Collection Time   03/30/12  5:55 AM      Component Value Range Comment   Creatinine, Ser 0.94  0.50 - 1.35 mg/dL    GFR calc non Af  Amer 88 (*) >90 mL/min    GFR calc Af Amer >90  >90 mL/min      Nursing note and vitals reviewed.  Constitutional: He appears well-developed and well-nourished.  HENT:  Head: Normocephalic and atraumatic.  Eyes: Pupils are equal, round, and reactive to light.  Neck: Normal range of motion. Neck supple.  Cardiovascular: Normal rate and regular rhythm.  Pulmonary/Chest: Effort normal and breath sounds normal.  Abdominal: Soft. Bowel sounds are normal.  Musculoskeletal: He exhibits no edema.  Neurological: He is alert.  Oriented to self and situation.. Able to follow basic commands. Dense left hemiparesis.0/5 except 2-/5 L ankle PF , R side 5/5. Mild left inattention. Skin: Skin is warm and dry.  1/2 FB sublux on L, + Hawkins at 95 deg   Assessment/Plan: 1. Functional deficits secondary to Large R MCA infarct with L HP, L hemisensory def, cognitive deficits which require 3+ hours per day of interdisciplinary therapy in a comprehensive inpatient rehab setting. Physiatrist is providing close team supervision and 24 hour management of active medical problems listed below. Physiatrist and rehab team continue to assess barriers to discharge/monitor patient progress toward functional and medical goals. FIM: FIM - Bathing Bathing Steps Patient Completed: Chest;Right Arm;Left Arm;Abdomen;Front perineal area;Buttocks;Right upper leg;Left upper leg;Right lower leg (including foot);Left lower leg (including foot) Bathing: 3: Mod-Patient completes 5-7 62f 10 parts or 50-74%  FIM - Upper Body Dressing/Undressing Upper body dressing/undressing steps patient completed: Thread/unthread right sleeve of pullover shirt/dresss;Thread/unthread left sleeve of pullover shirt/dress;Put head through opening of pull over shirt/dress;Pull shirt over trunk Upper body dressing/undressing: 1: Total-Patient completed less  than 25% of tasks FIM - Lower Body Dressing/Undressing Lower body dressing/undressing steps  patient completed: Thread/unthread right pants leg;Thread/unthread left pants leg;Pull pants up/down;Don/Doff right sock;Don/Doff left sock;Don/Doff right shoe;Don/Doff left shoe;Fasten/unfasten right shoe;Fasten/unfasten left shoe Lower body dressing/undressing: 1: Two helpers  FIM - Toileting Toileting: 0: Activity did not occur  FIM - Diplomatic Services operational officer Devices: Grab bars Toilet Transfers: 4-To toilet/BSC: Min A (steadying Pt. > 75%);4-From toilet/BSC: Min A (steadying Pt. > 75%)  FIM - Bed/Chair Transfer Bed/Chair Transfer Assistive Devices: Arm rests;Bed rails Bed/Chair Transfer: 4: Supine > Sit: Min A (steadying Pt. > 75%/lift 1 leg);4: Sit > Supine: Min A (steadying pt. > 75%/lift 1 leg);4: Bed > Chair or W/C: Min A (steadying Pt. > 75%);4: Chair or W/C > Bed: Min A (steadying Pt. > 75%)  FIM - Locomotion: Wheelchair Distance: 60 Locomotion: Wheelchair: 2: Travels 50 - 149 ft with moderate assistance (Pt: 50 - 74%) FIM - Locomotion: Ambulation Locomotion: Ambulation Assistive Devices: Fara Boros Ambulation/Gait Assistance: 1: +2 Total assist Locomotion: Ambulation: 1: Two helpers  Comprehension Comprehension Mode: Auditory Comprehension: 3-Understands basic 50 - 74% of the time/requires cueing 25 - 50%  of the time  Expression Expression Mode: Verbal Expression: 3-Expresses basic 50 - 74% of the time/requires cueing 25 - 50% of the time. Needs to repeat parts of sentences.  Social Interaction Social Interaction: 2-Interacts appropriately 25 - 49% of time - Needs frequent redirection.  Problem Solving Problem Solving: 2-Solves basic 25 - 49% of the time - needs direction more than half the time to initiate, plan or complete simple activities  Memory Memory: 3-Recognizes or recalls 50 - 74% of the time/requires cueing 25 - 49% of the time   Medical Problem List and Plan:  1. DVT Prophylaxis/Anticoagulation: Pharmaceutical: Lovenox  2. Pain  Management: Was on started on NSAIDS PTA due to left foot pain. Will monitor for symptoms with increase in activity levels. Continue prn tylenol for now.  3. Mood: patient with poor awareness of deficits. Will monitor for now. LCSW to follow for formal evaluation. Feels down with poor appetite will trial lexapro 4. Neuropsych: This patient is not capable of making decisions on his/her own behalf.  5. LLL PNA: Treated with short course antibiotics. Residual mild leukocytosis 6. ZOX:[id number]. Off antihypertensives at current time. Will resume  7. Focal motor seizures: continue keppra 1250 mg bid.  8. Insomnia/Hallucinations: Will discontinue seroquel as causing fatigue/daytime sedation. Will schedule trazodone at bedtime.  9. Symptomatic R-ICA stenosis: continue ASA and plavix. Neurology indicates for surgery past discharge? 10.  L Hemiplegic shoulder pain support, positioning cont OT, start meds if worsening  LOS (Days) 7 A FACE TO FACE EVALUATION WAS PERFORMED  KIRSTEINS,ANDREW E 03/30/2012, 7:40 AM

## 2012-03-31 ENCOUNTER — Inpatient Hospital Stay (HOSPITAL_COMMUNITY): Payer: Medicare Other | Admitting: Speech Pathology

## 2012-03-31 ENCOUNTER — Encounter (HOSPITAL_COMMUNITY): Payer: Medicare Other

## 2012-03-31 ENCOUNTER — Inpatient Hospital Stay (HOSPITAL_COMMUNITY): Payer: Medicare Other

## 2012-03-31 ENCOUNTER — Inpatient Hospital Stay (HOSPITAL_COMMUNITY): Payer: Medicare Other | Admitting: Physical Therapy

## 2012-03-31 DIAGNOSIS — I6340 Cerebral infarction due to embolism of unspecified cerebral artery: Secondary | ICD-10-CM

## 2012-03-31 NOTE — Progress Notes (Signed)
Physical Therapy Session Note  Patient Details  Name: Shaun Pugh MRN: [id number] Date of Birth: 05-03-1951  Today's Date: 03/31/2012 Time: 4098-1191 Time Calculation (min): 45 min  Short Term Goals: Week 2:  PT Short Term Goal 1 (Week 2): Pt will move sit> supine with min assist. PT Short Term Goal 2 (Week 2): Pt will transfer bed/recliner >< w/c with mod assist. PT Short Term Goal 3 (Week 2): Pt will propel w/c x 150' with min assist. PT Short Term Goal 4 (Week 2): Pt will stand x 5 minutes at high table, max assist of 1 person. PT Short Term Goal 5 (Week 2): Pt will perform gait x 30' with assist of 1-2 people  Skilled Therapeutic Interventions/Progress Updates:   W/c propulsion using hemi method with RUE and RLE, x 125' with mod assist for steeriing, max VCs for technique, avoiding obstacles on L.  neuromuscular re-education via forced use, visual cues, VCs, manual cues, for: -standing in static stander x 4 minutes x 3, reaching to L and visually tracking to L for items -terminal hip extension 5 x 1 with harness for safety  Pt fatigued quickly in standing, but did not c/o dizziness.     Therapy Documentation Precautions:  Precautions Precautions: Fall Precaution Comments: Left sided weakness, right gaze preference, left inattention/neglect, left shoulder subluxation, Pusher tendencies  Restrictions Weight Bearing Restrictions: No   Pain: Pain Assessment Pain Assessment: No/denies pain      See FIM for current functional status  Therapy/Group: Individual Therapy  [name] 03/31/2012, 11:51 AM

## 2012-03-31 NOTE — Progress Notes (Signed)
Social Work Patient ID: Shaun Pugh, male   DOB: [birth year], 61 y.o.   MRN: [id number] Met with pt and wife to inform of team conference goals-min level and discharge 12/11.  Wife reports she has found a one level with ramp to Rent for as long as they need it.  She is here daily observing pt in therapies and participating when she can.  Both agreeable to plans and will  Work toward discharge.  Neuro-psych to evaluate, wife feels this is needed.

## 2012-03-31 NOTE — Progress Notes (Signed)
Occupational Therapy Weekly Progress Note  Patient Details  Name: Shaun Pugh MRN: [id number] Date of Birth: 04-Nov-1950  Today's Date: 03/31/2012 Time: 0730-0830 Time Calculation (min): 60 min  Patient has met 3 of 5 short term goals.    Patient continues to demonstrate the following deficits: left side visual/physical neglect, ADL performance, functional transfers, sitting/standing balance, and A/ROM LUE and therefore will continue to benefit from skilled OT intervention to enhance overall performance with BADL.  Patient progressing toward long term goals..  Continue plan of care.  OT Short Term Goals Week 1:  OT Short Term Goal 1 (Week 1): Patient will perform grooming with Mod Assist using LUE as stabilizer. OT Short Term Goal 1 - Progress (Week 1): Met OT Short Term Goal 2 (Week 1): Patient will perform bathing with Mod Assist. OT Short Term Goal 2 - Progress (Week 1): Met OT Short Term Goal 3 (Week 1): Patient will perform dressing with Max Assist. OT Short Term Goal 3 - Progress (Week 1): Progressing toward goal OT Short Term Goal 4 (Week 1): Patient will increase sitting balance from Poor- to Poor+ to increase functional performance during bathing and dressing. OT Short Term Goal 4 - Progress (Week 1): Met OT Short Term Goal 5 (Week 1): Patient will increase toilet transfer to Max Assist. OT Short Term Goal 5 - Progress (Week 1): Progressing toward goal Week 2:  OT Short Term Goal 1 (Week 2): Patient will perform dressing with Max Assist. OT Short Term Goal 2 (Week 2): Patient will increase sitting balance from Poor+ to Fair- to increase functional performance during bathing and dressing. OT Short Term Goal 3 (Week 2): Patient will increase toilet transfer to Max Assist. OT Short Term Goal 4 (Week 2): Patient will initiate dressing and bathing tasks without vc's.  Skilled Therapeutic Interventions/Progress Updates:    ADL re-training completed in shower this AM with rolling  shower chair. Session with focus on ADL performance, functional t/f, sitting and standing balance, left side awareness. 2 person transfer with min vc's for technique and safety to shower chair. Patient continues to be educated on hemi-dressing technique when donning t-shirt and continues to have Max diff; Max vc's for technique w/ increased assistance. Patient refused to attempt donning shirt without therapist assisting. 2 person needed for standing at sink for LB bathing and dressing (1 person to stand and 1 person to switch out chairs). Patient did participate in LB dressing as he placed his right leg in pant leg and used right hand to pull pants up to knees. Patient was incontinent of urine and BM in shower. Patient stated that he had to go and did not hold it to get to the toilet. Patient thought it was funny that he had an accident in the shower and then jokingly told wife that it was the therapist's fault.    Therapy Documentation Precautions:  Precautions Precautions: Fall Precaution Comments: Left sided weakness, right gaze preference, left inattention/neglect, left shoulder subluxation, Pusher tendencies  Restrictions Weight Bearing Restrictions: No Pain: Pain Assessment Pain Assessment: No/denies pain  See FIM for current functional status  Therapy/Group: Individual Therapy  Limmie Patricia, OTR/L '03/31/2012, 9:37 AM

## 2012-03-31 NOTE — Progress Notes (Signed)
Speech Language Pathology Daily Session Note and Weekly Progress Note  Patient Details  Name: Shaun Pugh MRN: [id number] Date of Birth: March 18, 1951  Today's Date: 03/31/2012 Time: 4098-1191 Time Calculation (min): 55 min  Short Term Goals: Week 1: SLP Short Term Goal 1 (Week 1): Patient will utilize small bites/sips and slow rate while consuming regular textures and thin liquids with mod A multimodal cuing. SLP Short Term Goal 1 - Progress (Week 1): Met SLP Short Term Goal 2 (Week 1): Patient will solve basic problems during functional tasks with mod A visual and verbal cueing. SLP Short Term Goal 2 - Progress (Week 1): Progressing toward goal SLP Short Term Goal 3 (Week 1): Patient will attend to the left sided during basic functional tasks with mod A verbal cuing.   SLP Short Term Goal 3 - Progress (Week 1): Progressing toward goal SLP Short Term Goal 4 (Week 1): Patient will demonstrate emergent awareness by asking for help when needed with max A verbal cueing.   SLP Short Term Goal 4 - Progress (Week 1): Progressing toward goal SLP Short Term Goal 5 (Week 1): Patient will alternate attention between the clinician and a basic functional task with mod A verbal and visual cues for redirection. SLP Short Term Goal 5 - Progress (Week 1): Progressing toward goal SLP Short Term Goal 6 (Week 1): Patient will utilize external aids to facilitate recall of new, daily information with max A multimodal cueing.   SLP Short Term Goal 6 - Progress (Week 1): Progressing toward goal  Skilled Therapeutic Interventions: Treatment targeted cognitive goals and dysphagia management.  Patient selectively attended to a basic familiar task for periods of approximately 3-5 minutes with mod assist multimodal cues for redirection.  Patient consumed regular textures and thin liquids with min assist verbal cues to pay attention to bolus and check for left sided buccal pocketing and exhibited no overt s/s of  aspiration.  Patient also solved problems during a basic functional sorting activity with mod assist visual and verbal cues to attend to the left side of visual field.  SLP also facilitated session with overall mod-max assist multimodal cuing for initiation of basic tasks in a quiet environment.  Overall, patient required less cuing in a more controlled environment, although he still requires max encouragement for participation.     FIM:  Comprehension Comprehension Mode: Auditory Comprehension: 4-Understands basic 75 - 89% of the time/requires cueing 10 - 24% of the time Expression Expression Mode: Verbal Expression: 4-Expresses basic 75 - 89% of the time/requires cueing 10 - 24% of the time. Needs helper to occlude trach/needs to repeat words. Social Interaction Social Interaction: 2-Interacts appropriately 25 - 49% of time - Needs frequent redirection. Problem Solving Problem Solving: 2-Solves basic 25 - 49% of the time - needs direction more than half the time to initiate, plan or complete simple activities Memory Memory: 3-Recognizes or recalls 50 - 74% of the time/requires cueing 25 - 49% of the time FIM - Eating Eating Activity: 5: Supervision/cues  Pain  No/denies pain  Therapy/Group: Individual Therapy  PageJoni Reining 03/31/2012, 4:56 PM   Speech Language Pathology Weekly Progress Note  Patient Details  Name: Shaun Pugh MRN: [id number] Date of Birth: January 11, 1951  Today's Date: 03/31/2012  Short Term Goals: Week 1: SLP Short Term Goal 1 (Week 1): Patient will utilize small bites/sips and slow rate while consuming regular textures and thin liquids with mod A multimodal cuing. SLP Short Term Goal 1 - Progress (Week 1):  Met SLP Short Term Goal 2 (Week 1): Patient will solve basic problems during functional tasks with mod A visual and verbal cueing. SLP Short Term Goal 2 - Progress (Week 1): Progressing toward goal SLP Short Term Goal 3 (Week 1): Patient will attend to the  left sided during basic functional tasks with mod A verbal cuing.   SLP Short Term Goal 3 - Progress (Week 1): Progressing toward goal SLP Short Term Goal 4 (Week 1): Patient will demonstrate emergent awareness by asking for help when needed with max A verbal cueing.   SLP Short Term Goal 4 - Progress (Week 1): Progressing toward goal SLP Short Term Goal 5 (Week 1): Patient will alternate attention between the clinician and a basic functional task with mod A verbal and visual cues for redirection. SLP Short Term Goal 5 - Progress (Week 1): Progressing toward goal SLP Short Term Goal 6 (Week 1): Patient will utilize external aids to facilitate recall of new, daily information with max A multimodal cueing.   SLP Short Term Goal 6 - Progress (Week 1): Progressing toward goal Week 2: SLP Short Term Goal 1 (Week 2): Patient will utilize small bites/sips and slow rate while consuming regular textures and thin liquids with min A multimodal cuing. SLP Short Term Goal 2 (Week 2): Patient will solve basic problems during functional tasks with mod A visual and verbal cueing.  SLP Short Term Goal 3 (Week 2): Patient will attend to the left during basic functional tasks with mod A verbal cuing. SLP Short Term Goal 4 (Week 2): Patient will demonstrate emergent awareness by asking for help when needed with max A verbal cueing.  SLP Short Term Goal 5 (Week 2): Patient will alternate attention between the clinician and a basic functional task with mod A verbal and visual cues for redirection.  SLP Short Term Goal 6 (Week 2): Patient will utilize external aids to facilitate recall of new, daily information with max A multimodal cueing.  Weekly Progress Updates: Patient met 1 out of 6 short term goals this reporting period with functional gains in dysphagia management.  Patient is tolerating regular textures and thin liquids with overall mod A verbal cuing for utilization of lingual sweep to clear left sided buccal  pocketing.  Patient continues to present with decreased attention and motivation during therapy sessions which impact his initiation and completion of basic functional tasks.  Patient also frequently tries to evade therapeutic activities with humor and asking to go back to bed.  Patient currently demonstrates sustained attention to a basic familiar task in a controlled environment for periods of 3-5 minutes with mod A-max A multimodal cues for initiation and redirection.  Patient would continue to benefit from skilled SLP services for cognition and dysphagia management during CIR stay to maximize functional independence and reduce burden of care upon discharge.    SLP Frequency: 1-2 X/day, 30-60 minutes;5 out of 7 days Estimated Length of Stay: 2.5-3 weeks SLP Treatment/Interventions: Cognitive remediation/compensation;Cueing hierarchy;Dysphagia/aspiration precaution training;Environmental controls;Functional tasks;Internal/external aids;Patient/family education;Therapeutic Activities;Speech/Language facilitation  Jackalyn Lombard, [place]   Graduate Clinician Speech Language Pathology  Neta Upadhyay, Joni Reining 03/31/2012, 4:56 PM

## 2012-03-31 NOTE — Progress Notes (Signed)
The skilled treatment note has been reviewed and SLP is in agreement. Monicia Tse, M.A., CCC-SLP [phone]  

## 2012-03-31 NOTE — Progress Notes (Signed)
INITIAL ADULT NUTRITION ASSESSMENT Date: 03/31/2012   Time: 1:51 PM Reason for Assessment: LOS, decreased intake  ASSESSMENT: Male 61 y.o.  Dx: Stroke, acute, embolic  Hx:  Past Medical History  Diagnosis Date  . High cholesterol   . Atrophy of right kidney 09/15/2011  . Kidney calculi 09/15/2011  . Acute pancreatitis 09/14/2011  . Cholelithiasis 09/14/2011  . Chronic back pain   . Sleep apnea     STOP BANG 5  . TIA (transient ischemic attack)   . CAD (coronary artery disease)   . Stented coronary artery approx 2004   Past Surgical History  Procedure Date  . Back surgery     x 4   . Cholecystectomy 09/25/2011    Procedure: LAPAROSCOPIC CHOLECYSTECTOMY;  Surgeon: Fabio Bering, MD;  Location: [facility];  Service: General;  Laterality: N/A;    Related Meds:     . aspirin EC  325 mg Oral Daily  . atorvastatin  20 mg Oral q1800  . clopidogrel  75 mg Oral Q breakfast  . dextromethorphan-guaiFENesin  1 tablet Oral BID  . enoxaparin  40 mg Subcutaneous Q24H  . escitalopram  5 mg Oral Daily  . fluticasone  1 spray Each Nare Daily  . hydrochlorothiazide  12.5 mg Oral Daily  . levETIRAcetam  1,250 mg Oral BID  . pantoprazole  40 mg Oral Daily  . potassium chloride  10 mEq Oral BID  . senna-docusate  2 tablet Oral BID  . traZODone  50 mg Oral QHS     Ht:  6'1" (185.4cm)  Wt: 259 lb 7.7 oz (117.7 kg)  Ideal Wt:    84 kg % Ideal Wt: 140  Usual Wt:  Wt Readings from Last 10 Encounters:  03/30/12 259 lb 7.7 oz (117.7 kg)  03/17/12 255 lb 1.2 oz (115.7 kg)  09/23/11 257 lb (116.574 kg)  09/14/11 256 lb 13.4 oz (116.5 kg)    % Usual Wt: 101  BMI:  34-obesity grade 1  Labs:  CMP     Component Value Date/Time   NA 141 03/24/2012 0615   K 3.4* 03/24/2012 0615   CL 108 03/24/2012 0615   CO2 21 03/24/2012 0615   GLUCOSE 105* 03/24/2012 0615   BUN 17 03/24/2012 0615   CREATININE 0.94 03/30/2012 0555   CALCIUM 8.7 03/24/2012 0615   PROT 7.2 03/24/2012 0615   ALBUMIN  3.1* 03/24/2012 0615   AST 19 03/24/2012 0615   ALT 35 03/24/2012 0615   ALKPHOS 61 03/24/2012 0615   BILITOT 0.6 03/24/2012 0615   GFRNONAA 88* 03/30/2012 0555   GFRAA >90 03/30/2012 0555    I/O last 3 completed shifts: In: 860 [P.O.:860] Out: 2500 [Urine:2500] Total I/O In: 360 [P.O.:360] Out: 525 [Urine:525]   Diet Order: General  Supplements/Tube Feeding:  none  IVF:    Estimated Nutritional Needs:   Kcal: 2100-2300 Protein: 110-120 gm Fluid: 2.1-2.3kg  Food/Nutrition Related Hx: Wife reports that patient ate a regular diet prior to admit with good intake but no appetite currently and decreased intake.  States that MD thinks pt is depressed and has given him something for this.  Pt eating 25-50% of regular diet.  NUTRITION DIAGNOSIS: -Inadequate oral intake (NI-2.1).  Status: Ongoing  RELATED TO: decreased appetite  AS EVIDENCE BY: reported and documented intake  MONITORING/EVALUATION(Goals): Intake, labs, weight  EDUCATION NEEDS: -No education needs identified at this time  INTERVENTION: Encouraged po and educated on increased needs.   Will send high protein shake  or [employer] with each meal.  Dietitian [phone]  DOCUMENTATION CODES Per approved criteria  -Obesity grade 1    Tamiyah Moulin, Anastasia Fiedler 03/31/2012, 1:51 PM

## 2012-03-31 NOTE — Progress Notes (Signed)
Patient ID: Shaun Pugh, male   DOB: [birth year], 61 y.o.   MRN: [id number] Shaun Pugh is a 61 y.o. RH-male with history of CAD, TIAs with left visual field deficits as well as dizziness/HA. Admitted on 03/17/12 with HA, left sided weakness and numbness with slurred speech. CT head negative and treated with tPA. CTA head/neck with hypodensity right superior gyrus, ulcerated plaque right ICA with 60% ICA stenosis, suspicion for 10mm focal thrombus proximal aortic arch. Patient developed combativeness with agitation as well as focal motor seizures LUE on 03/18/12. He was treated with ativan and started on Keppra. F/u CCT with extensive area of low density affecting cortical and subcortical right hemisphere in MCA distribution. 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 Placed on ASA and no anticoagulation due to large stroke and risk of bleeding. EEG without epileptiform activity. He did develop fevers and was started on antibiotics for LLL infiltrate. Carotid dopplers done revealing 60-79% ICA stenosis. 2D echo with EF 65-70%   Subjective/Complaints: No pain C/os  Review of Systems  Constitutional: Positive for malaise/fatigue.  Respiratory: Negative for cough.   Cardiovascular: Negative for chest pain.  Neurological: Positive for sensory change, speech change and focal weakness. Negative for seizures.  All other systems reviewed and are negative.    Objective: Vital Signs: Blood pressure 132/87, pulse 83, temperature 97.7 F (36.5 C), temperature source Oral, resp. rate 18, weight 117.7 kg (259 lb 7.7 oz), SpO2 94.00%. No results found. Results for orders placed during the hospital encounter of 03/23/12 (from the past 72 hour(s))  CREATININE, SERUM     Status: Abnormal   Collection Time   03/30/12  5:55 AM      Component Value Range Comment   Creatinine, Ser 0.94  0.50 - 1.35 mg/dL    GFR calc non Af Amer 88 (*) >90 mL/min    GFR  calc Af Amer >90  >90 mL/min      Nursing note and vitals reviewed.  Constitutional: He appears well-developed and well-nourished.  HENT:  Head: Normocephalic and atraumatic.  Eyes: Pupils are equal, round, and reactive to light.  Neck: Normal range of motion. Neck supple.  Cardiovascular: Normal rate and regular rhythm.  Pulmonary/Chest: Effort normal and breath sounds normal.  Abdominal: Soft. Bowel sounds are normal.  Musculoskeletal: He exhibits no edema.  Neurological: He is alert.  Oriented to self and situation.. Able to follow basic commands. Dense left hemiparesis.0/5 except 2-/5 L ankle PF , R side 5/5. Mild left inattention. Skin: Skin is warm and dry.  1/2 FB sublux on L, + Hawkins at 95 deg   Assessment/Plan: 1. Functional deficits secondary to Large R MCA infarct with L HP, L hemisensory def, cognitive deficits which require 3+ hours per day of interdisciplinary therapy in a comprehensive inpatient rehab setting. Physiatrist is providing close team supervision and 24 hour management of active medical problems listed below. Physiatrist and rehab team continue to assess barriers to discharge/monitor patient progress toward functional and medical goals. FIM: FIM - Bathing Bathing Steps Patient Completed: Chest;Right Arm;Left Arm;Abdomen;Front perineal area;Buttocks;Right upper leg;Left upper leg;Right lower leg (including foot);Left lower leg (including foot) Bathing: 3: Mod-Patient completes 5-7 33f 10 parts or 50-74%  FIM - Upper Body Dressing/Undressing Upper body dressing/undressing steps patient completed: Thread/unthread right sleeve of pullover shirt/dresss;Thread/unthread left sleeve of pullover shirt/dress;Put head through opening of pull over shirt/dress;Pull shirt over trunk Upper body dressing/undressing: 2: Max-Patient completed 25-49% of tasks  FIM - Lower Body Dressing/Undressing Lower body dressing/undressing steps patient completed: Thread/unthread right pants  leg;Thread/unthread left pants leg;Pull pants up/down;Don/Doff right sock;Don/Doff left sock;Don/Doff right shoe;Don/Doff left shoe;Fasten/unfasten right shoe;Fasten/unfasten left shoe Lower body dressing/undressing: 1: Two helpers  FIM - Toileting Toileting steps completed by patient: Adjust clothing prior to toileting;Performs perineal hygiene;Adjust clothing after toileting Toileting: 1: Total-Patient completed zero steps, helper did all 3  FIM - Diplomatic Services operational officer Devices: Grab bars Toilet Transfers: 1-Two helpers  FIM - Banker Devices: Arm rests;Bed rails Bed/Chair Transfer: 2: Bed > Chair or W/C: Max A (lift and lower assist);2: Chair or W/C > Bed: Max A (lift and lower assist)  FIM - Locomotion: Wheelchair Distance: 60 Locomotion: Wheelchair: 2: Travels 50 - 149 ft with moderate assistance (Pt: 50 - 74%) FIM - Locomotion: Ambulation Locomotion: Ambulation Assistive Devices: Fara Boros Ambulation/Gait Assistance: 1: +2 Total assist Locomotion: Ambulation: 0: Activity did not occur  Comprehension Comprehension Mode: Auditory Comprehension: 4-Understands basic 75 - 89% of the time/requires cueing 10 - 24% of the time  Expression Expression Mode: Verbal Expression: 4-Expresses basic 75 - 89% of the time/requires cueing 10 - 24% of the time. Needs helper to occlude trach/needs to repeat words.  Social Interaction Social Interaction: 2-Interacts appropriately 25 - 49% of time - Needs frequent redirection.  Problem Solving Problem Solving: 2-Solves basic 25 - 49% of the time - needs direction more than half the time to initiate, plan or complete simple activities  Memory Memory: 3-Recognizes or recalls 50 - 74% of the time/requires cueing 25 - 49% of the time   Medical Problem List and Plan:  1. DVT Prophylaxis/Anticoagulation: Pharmaceutical: Lovenox  2. Pain Management: Was on started on NSAIDS PTA due to  left foot pain. Will monitor for symptoms with increase in activity levels. Continue prn tylenol for now.  3. Mood: patient with poor awareness of deficits. Will monitor for now. LCSW to follow for formal evaluation. Feels down with poor appetite will trial lexapro 4. Neuropsych: This patient is not capable of making decisions on his/her own behalf.  5. LLL PNA: Treated with short course antibiotics. Residual mild leukocytosis 6. RUE:[id number]. Off antihypertensives at current time. Will resume  7. Focal motor seizures: continue keppra 1250 mg bid.  8. Insomnia/Hallucinations: Will discontinue seroquel as causing fatigue/daytime sedation. Will schedule trazodone at bedtime.  9. Symptomatic R-ICA stenosis: continue ASA and plavix. Neurology indicates for surgery past discharge? 10.  L Hemiplegic shoulder pain support, positioning cont OT, kinesiotape start meds if worsening  LOS (Days) 8 A FACE TO FACE EVALUATION WAS PERFORMED  Cornelious Bartolucci E 03/31/2012, 7:43 AM

## 2012-04-01 ENCOUNTER — Inpatient Hospital Stay (HOSPITAL_COMMUNITY): Payer: Medicare Other

## 2012-04-01 ENCOUNTER — Inpatient Hospital Stay (HOSPITAL_COMMUNITY): Payer: Medicare Other | Admitting: Speech Pathology

## 2012-04-01 NOTE — Progress Notes (Signed)
Patient ID: Shaun Pugh, male   DOB: 1951/02/19, 61 y.o.   MRN: [id number] Shaun Pugh is a 61 y.o. RH-male with history of CAD, TIAs with left visual field deficits as well as dizziness/HA. Admitted on 03/17/12 with HA, left sided weakness and numbness with slurred speech. CT head negative and treated with tPA. CTA head/neck with hypodensity right superior gyrus, ulcerated plaque right ICA with 60% ICA stenosis, suspicion for 10mm focal thrombus proximal aortic arch. Patient developed combativeness with agitation as well as focal motor seizures LUE on 03/18/12. He was treated with ativan and started on Keppra. F/u CCT with extensive area of low density affecting cortical and subcortical right hemisphere in MCA distribution. 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 Placed on ASA and no anticoagulation due to large stroke and risk of bleeding. EEG without epileptiform activity. He did develop fevers and was started on antibiotics for LLL infiltrate. Carotid dopplers done revealing 60-79% ICA stenosis. 2D echo with EF 65-70%   Subjective/Complaints: No pain C/os Shoulder with kinesiotape  Review of Systems  Constitutional: Positive for malaise/fatigue.  Respiratory: Negative for cough.   Cardiovascular: Negative for chest pain.  Neurological: Positive for sensory change, speech change and focal weakness. Negative for seizures.  All other systems reviewed and are negative.    Objective: Vital Signs: Blood pressure 130/84, pulse 83, temperature 98.4 F (36.9 C), temperature source Oral, resp. rate 19, weight 117.7 kg (259 lb 7.7 oz), SpO2 94.00%. No results found. Results for orders placed during the hospital encounter of 03/23/12 (from the past 72 hour(s))  CREATININE, SERUM     Status: Abnormal   Collection Time   03/30/12  5:55 AM      Component Value Range Comment   Creatinine, Ser 0.94  0.50 - 1.35 mg/dL    GFR calc non Af Amer  88 (*) >90 mL/min    GFR calc Af Amer >90  >90 mL/min      Nursing note and vitals reviewed.  Constitutional: He appears well-developed and well-nourished.  HENT:  Head: Normocephalic and atraumatic.  Eyes: Pupils are equal, round, and reactive to light.  Neck: Normal range of motion. Neck supple.  Cardiovascular: Normal rate and regular rhythm.  Pulmonary/Chest: Effort normal and breath sounds normal.  Abdominal: Soft. Bowel sounds are normal.  Musculoskeletal: He exhibits no edema.  Neurological: He is alert.  Oriented to self and situation.. Able to follow basic commands. Dense left hemiparesis.0/5 except 2-/5 L ankle PF , R side 5/5. Mild left inattention. Skin: Skin is warm and dry.  1/2 FB sublux on L, + Hawkins at 95 deg   Assessment/Plan: 1. Functional deficits secondary to Large R MCA infarct with L HP, L hemisensory def, cognitive deficits which require 3+ hours per day of interdisciplinary therapy in a comprehensive inpatient rehab setting. Physiatrist is providing close team supervision and 24 hour management of active medical problems listed below. Physiatrist and rehab team continue to assess barriers to discharge/monitor patient progress toward functional and medical goals. FIM: FIM - Bathing Bathing Steps Patient Completed: Chest;Right Arm;Left Arm;Abdomen;Front perineal area;Buttocks;Right upper leg;Left upper leg;Right lower leg (including foot);Left lower leg (including foot) Bathing: 3: Mod-Patient completes 5-7 27f 10 parts or 50-74%  FIM - Upper Body Dressing/Undressing Upper body dressing/undressing steps patient completed: Thread/unthread right sleeve of pullover shirt/dresss;Thread/unthread left sleeve of pullover shirt/dress;Put head through opening of pull over shirt/dress;Pull shirt over trunk Upper body dressing/undressing: 2: Max-Patient completed  25-49% of tasks FIM - Lower Body Dressing/Undressing Lower body dressing/undressing steps patient completed:  Thread/unthread right pants leg;Thread/unthread left pants leg;Pull pants up/down;Don/Doff right sock;Don/Doff left sock;Don/Doff right shoe;Don/Doff left shoe;Fasten/unfasten right shoe;Fasten/unfasten left shoe Lower body dressing/undressing: 1: Two helpers  FIM - Toileting Toileting steps completed by patient: Adjust clothing prior to toileting;Performs perineal hygiene;Adjust clothing after toileting Toileting: 0: No continent bowel/bladder events this shift  FIM - Diplomatic Services operational officer Devices: Grab bars Toilet Transfers: 1-Two helpers  FIM - Banker Devices: Arm rests;Bed rails Bed/Chair Transfer: 0: Activity did not occur  FIM - Locomotion: Wheelchair Distance: 60 Locomotion: Wheelchair: 2: Travels 50 - 149 ft with moderate assistance (Pt: 50 - 74%) FIM - Locomotion: Ambulation Locomotion: Ambulation Assistive Devices: Fara Boros Ambulation/Gait Assistance: 1: +2 Total assist Locomotion: Ambulation: 0: Activity did not occur  Comprehension Comprehension Mode: Auditory Comprehension: 4-Understands basic 75 - 89% of the time/requires cueing 10 - 24% of the time  Expression Expression Mode: Verbal Expression: 4-Expresses basic 75 - 89% of the time/requires cueing 10 - 24% of the time. Needs helper to occlude trach/needs to repeat words.  Social Interaction Social Interaction: 2-Interacts appropriately 25 - 49% of time - Needs frequent redirection.  Problem Solving Problem Solving: 2-Solves basic 25 - 49% of the time - needs direction more than half the time to initiate, plan or complete simple activities  Memory Memory: 3-Recognizes or recalls 50 - 74% of the time/requires cueing 25 - 49% of the time   Medical Problem List and Plan:  1. DVT Prophylaxis/Anticoagulation: Pharmaceutical: Lovenox  2. Pain Management: Was on started on NSAIDS PTA due to left foot pain. Will monitor for symptoms with increase in  activity levels. Continue prn tylenol for now.  3. Mood: patient with poor awareness of deficits. Will monitor for now. LCSW to follow for formal evaluation. Feels down with poor appetite will trial lexapro 4. Neuropsych: This patient is not capable of making decisions on his/her own behalf.  5. LLL PNA: Treated with short course antibiotics. Residual mild leukocytosis 6. ZOX:[id number]. Off antihypertensives at current time. Will resume  7. Focal motor seizures: continue keppra 1250 mg bid.  8. Insomnia/Hallucinations: Will discontinue seroquel as causing fatigue/daytime sedation. Will schedule trazodone at bedtime.  9. Symptomatic R-ICA stenosis: continue ASA and plavix. Neurology indicates for surgery past discharge? 10.  L Hemiplegic shoulder pain support, positioning cont OT, kinesiotape start meds if worsening  LOS (Days) 9 A FACE TO FACE EVALUATION WAS PERFORMED  Yared Susan E 04/01/2012, 7:25 AM

## 2012-04-01 NOTE — Progress Notes (Signed)
Physical Therapy Session Note  Patient Details  Name: Shaun Pugh MRN: [id number] Date of Birth: January 26, 1951  Today's Date: 04/01/2012 Time: 4098-1191 Time Calculation (min): 55 min  Skilled Therapeutic Interventions/Progress Updates:  Attempted ambulation with [place] Dec and walking sling. Therapist unable to safely advance and control left LE due to location of knee pads. Patient sit to stand and ambulated 30 feet x 2 with Carley Hammed walker and  +2 assist (one to guide Carley Hammed walker) Patient requires total assist to advance left LE and for correct foot placement. Patient also needs facilitation/assistance for hip and knee extension on left. Patient propelled wheelchair on level tile 120 feet with minimal assistance/cueing to avoid objects on left and stay to right. Patient transferred wheelchair to and from recliner with +1 mas assist (lifting and lowering assist).   Therapy Documentation Precautions:  Precautions Precautions: Fall Precaution Comments: Left sided weakness, right gaze preference, left inattention/neglect, left shoulder subluxation, Pusher tendencies  Restrictions Weight Bearing Restrictions: No  Pain: Pain Assessment Pain Assessment: No/denies pain  Locomotion : Ambulation Ambulation/Gait Assistance: 1: +2 Total assist Wheelchair Mobility Distance: 120   See FIM for current functional status  Therapy/Group: Individual Therapy  Alma Friendly 04/01/2012, 3:54 PM

## 2012-04-01 NOTE — Progress Notes (Addendum)
Occupational Therapy Session Note  Patient Details  Name: Shaun Pugh MRN: [id number] Date of Birth: 12/23/1950  Today's Date: 04/01/2012 Time: 0730-0830 (1030-1100) Time Calculation (min): 60 min ( )  Short Term Goals: Week 2:  OT Short Term Goal 1 (Week 2): Patient will perform dressing with Max Assist. OT Short Term Goal 2 (Week 2): Patient will increase sitting balance from Poor+ to Fair- to increase functional performance during bathing and dressing. OT Short Term Goal 3 (Week 2): Patient will increase toilet transfer to Max Assist. OT Short Term Goal 4 (Week 2): Patient will initiate dressing and bathing tasks without vc's.  Skilled Therapeutic Interventions/Progress Updates:    Session 1: ADL re-training completed in shower this AM with rolling shower chair. Session with focus on ADL performance, functional t/f, sitting and standing balance, left side awareness. 2 person transfer with min vc's for technique and safety to shower chair. Once patient was rolled out of bathroom to sink he stated that he needed to have a BM and was unable to hold it and as a result had an accident on the floor. Patient showed no remorse and in turn jokingly stated that it was the therapist's fault. Patient continues to be educated on hemi-dressing technique when donning t-shirt and continues to have Max diff; Max vc's for technique w/ increased assistance. Patient having increased difficulty with left side awareness during donning of t-shirt. Patient needed max verbal, physical, and tactile cues to look past midline to the left and attend to left arm. Increased time needed to donn shirt. Decreased attention to task during UB dressing. 2 person needed for standing at sink for LB bathing and dressing (1 person to stand and 1 person to switch out chairs). Patient did participate in LB dressing again this morning as he placed his right leg in pant leg and used right hand to pull pants up to knees with max verbal  and visual cues.  Session 2: Bioness Initiated this date. Medium device; Left. B panels, D: 9, Flex: 6, Ext: 6. Mode: Exercise (tolerated for 4 minutes). Educated patient about importance of attending to left side. Patient not interested in education. Patient very sarcastic towards therapist and did not show any interest in therapy. Visitors present towards end of tx session. Patient was left in recliner with QRB and call light close.   Therapy Documentation Precautions:  Precautions Precautions: Fall Precaution Comments: Left sided weakness, right gaze preference, left inattention/neglect, left shoulder subluxation, Pusher tendencies  Restrictions Weight Bearing Restrictions: No Pain: Pain Assessment Pain Assessment: No/denies pain  See FIM for current functional status  Therapy/Group: Individual Therapy  Limmie Patricia, OTR/L 04/01/2012, 9:36 AM  Addendum: 2nd session added.

## 2012-04-01 NOTE — Progress Notes (Signed)
Speech Language Pathology Daily Session Note  Patient Details  Name: Shaun Pugh MRN: [id number] Date of Birth: 1950-09-09  Today's Date: 04/01/2012 Time: 4098-1191 Time Calculation (min): 40 min  Short Term Goals: Week 2: SLP Short Term Goal 1 (Week 2): Patient will utilize small bites/sips and slow rate while consuming regular textures and thin liquids with min A multimodal cuing. SLP Short Term Goal 2 (Week 2): Patient will solve basic problems during functional tasks with mod A visual and verbal cueing.  SLP Short Term Goal 3 (Week 2): Patient will attend to the left during basic functional tasks with mod A verbal cuing. SLP Short Term Goal 4 (Week 2): Patient will demonstrate emergent awareness by asking for help when needed with max A verbal cueing.  SLP Short Term Goal 5 (Week 2): Patient will alternate attention between the clinician and a basic functional task with mod A verbal and visual cues for redirection.  SLP Short Term Goal 6 (Week 2): Patient will utilize external aids to facilitate recall of new, daily information with max A multimodal cueing.  Skilled Therapeutic Interventions: Treatment targeted cognition during a self care task.  SLP facilitated session by reducing environmental distractions and direct cues regarding expectations for sustained attention to task.  PAtietn required max assist cues to initiate self feeding and mod assist cues to sustain attention for 3 minute periods.  During a scanning task SLP facilitated session with  supervision cues with right side of environment and max assist cues to scan to left of environment.    FIM:  Comprehension Comprehension Mode: Auditory Comprehension: 4-Understands basic 75 - 89% of the time/requires cueing 10 - 24% of the time Expression Expression Mode: Verbal Expression: 4-Expresses basic 75 - 89% of the time/requires cueing 10 - 24% of the time. Needs helper to occlude trach/needs to repeat words. Social  Interaction Social Interaction: 2-Interacts appropriately 25 - 49% of time - Needs frequent redirection. Problem Solving Problem Solving: 2-Solves basic 25 - 49% of the time - needs direction more than half the time to initiate, plan or complete simple activities Memory Memory: 3-Recognizes or recalls 50 - 74% of the time/requires cueing 25 - 49% of the time FIM - Eating Eating Activity: 5: Supervision/cues  Pain Pain Assessment Pain Assessment: No/denies pain  Therapy/Group: Individual Therapy  Charlane Ferretti., CCC-SLP [phone]  Concetta Guion 04/01/2012, 9:19 AM

## 2012-04-02 ENCOUNTER — Inpatient Hospital Stay (HOSPITAL_COMMUNITY): Payer: Medicare Other | Admitting: Physical Therapy

## 2012-04-02 ENCOUNTER — Inpatient Hospital Stay (HOSPITAL_COMMUNITY): Payer: Medicare Other | Admitting: Speech Pathology

## 2012-04-02 DIAGNOSIS — I6340 Cerebral infarction due to embolism of unspecified cerebral artery: Secondary | ICD-10-CM

## 2012-04-02 DIAGNOSIS — G8110 Spastic hemiplegia affecting unspecified side: Secondary | ICD-10-CM

## 2012-04-02 DIAGNOSIS — I69922 Dysarthria following unspecified cerebrovascular disease: Secondary | ICD-10-CM

## 2012-04-02 MED ORDER — HYDROCHLOROTHIAZIDE 12.5 MG PO CAPS
25.0000 mg | ORAL_CAPSULE | Freq: Every day | ORAL | Status: DC
  Filled 2012-04-02 (×2): qty 2

## 2012-04-02 MED ORDER — HYDROCHLOROTHIAZIDE 25 MG PO TABS
25.0000 mg | ORAL_TABLET | Freq: Every day | ORAL | Status: DC
  Administered 2012-04-02 – 2012-04-20 (×19): 25 mg via ORAL
  Filled 2012-04-02 (×21): qty 1

## 2012-04-02 NOTE — Progress Notes (Signed)
The skilled treatment note has been reviewed and SLP is in agreement. Kevona Lupinacci, M.A., CCC-SLP [phone]  

## 2012-04-02 NOTE — Progress Notes (Signed)
Patient ID: Shaun Pugh, male   DOB: April 15, 1951, 61 y.o.   MRN: [id number] Shaun Pugh is a 61 y.o. RH-male with history of CAD, TIAs with left visual field deficits as well as dizziness/HA. Admitted on 03/17/12 with HA, left sided weakness and numbness with slurred speech. CT head negative and treated with tPA. CTA head/neck with hypodensity right superior gyrus, ulcerated plaque right ICA with 60% ICA stenosis, suspicion for 10mm focal thrombus proximal aortic arch. Patient developed combativeness with agitation as well as focal motor seizures LUE on 03/18/12. He was treated with ativan and started on Keppra. F/u CCT with extensive area of low density affecting cortical and subcortical right hemisphere in MCA distribution. 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 Placed on ASA and no anticoagulation due to large stroke and risk of bleeding. EEG without epileptiform activity. He did develop fevers and was started on antibiotics for LLL infiltrate. Carotid dopplers done revealing 60-79% ICA stenosis. 2D echo with EF 65-70%   Subjective/Complaints: No complaints today. Slept well    Review of Systems  Constitutional: Positive for malaise/fatigue.  Respiratory: Negative for cough.   Cardiovascular: Negative for chest pain.  Neurological: Positive for sensory change, speech change and focal weakness. Negative for seizures.  All other systems reviewed and are negative.    Objective: Vital Signs: Blood pressure 149/91, pulse 83, temperature 98 F (36.7 C), temperature source Oral, resp. rate 18, weight 117.7 kg (259 lb 7.7 oz), SpO2 95.00%. No results found. No results found for this or any previous visit (from the past 72 hour(s)).   Nursing note and vitals reviewed.  Constitutional: He appears well-developed and well-nourished.  HENT:  Head: Normocephalic and atraumatic.  Eyes: Pupils are equal, round, and reactive to light.    Neck: Normal range of motion. Neck supple.  Cardiovascular: Normal rate and regular rhythm.  Pulmonary/Chest: Effort normal and breath sounds normal.  Abdominal: Soft. Bowel sounds are normal.  Musculoskeletal: He exhibits no edema.  Neurological: He is alert.  Oriented to self and situation.. Able to follow basic commands. Dense left hemiparesis.0/5 except 2-/5 L ankle PF , R side 5/5. Mild left inattention. Skin: Skin is warm and dry.  1/2 FB sublux on L, + Hawkins at 95 deg   Assessment/Plan: 1. Functional deficits secondary to Large R MCA infarct with L HP, L hemisensory def, cognitive deficits which require 3+ hours per day of interdisciplinary therapy in a comprehensive inpatient rehab setting. Physiatrist is providing close team supervision and 24 hour management of active medical problems listed below. Physiatrist and rehab team continue to assess barriers to discharge/monitor patient progress toward functional and medical goals. FIM: FIM - Bathing Bathing Steps Patient Completed: Chest;Right Arm;Left Arm;Abdomen;Front perineal area;Buttocks;Right upper leg;Left upper leg;Right lower leg (including foot);Left lower leg (including foot) Bathing: 3: Mod-Patient completes 5-7 43f 10 parts or 50-74%  FIM - Upper Body Dressing/Undressing Upper body dressing/undressing steps patient completed: Thread/unthread right sleeve of pullover shirt/dresss;Thread/unthread left sleeve of pullover shirt/dress;Put head through opening of pull over shirt/dress;Pull shirt over trunk Upper body dressing/undressing: 2: Max-Patient completed 25-49% of tasks FIM - Lower Body Dressing/Undressing Lower body dressing/undressing steps patient completed: Thread/unthread right pants leg;Thread/unthread left pants leg;Pull pants up/down;Don/Doff right sock;Don/Doff left sock;Don/Doff right shoe;Don/Doff left shoe;Fasten/unfasten right shoe;Fasten/unfasten left shoe Lower body dressing/undressing: 1: Two  helpers  FIM - Toileting Toileting steps completed by patient: Adjust clothing prior to toileting;Performs perineal hygiene;Adjust clothing after toileting Toileting: 0:  No continent bowel/bladder events this shift  FIM - Diplomatic Services operational officer Devices: Grab bars Toilet Transfers: 1-Two helpers  FIM - Banker Devices: Arm rests;Bed rails Bed/Chair Transfer: 2: Bed > Chair or W/C: Max A (lift and lower assist);2: Chair or W/C > Bed: Max A (lift and lower assist)  FIM - Locomotion: Wheelchair Distance: 120 Locomotion: Wheelchair: 2: Travels 50 - 149 ft with minimal assistance (Pt.>75%) FIM - Locomotion: Ambulation Locomotion: Ambulation Assistive Devices: Fara Boros Ambulation/Gait Assistance: 1: +2 Total assist Locomotion: Ambulation: 1: Travels less than 50 ft with total assistance/helper does all (Pt.<25%)  Comprehension Comprehension Mode: Auditory Comprehension: 5-Understands complex 90% of the time/Cues < 10% of the time  Expression Expression Mode: Verbal Expression: 4-Expresses basic 75 - 89% of the time/requires cueing 10 - 24% of the time. Needs helper to occlude trach/needs to repeat words.  Social Interaction Social Interaction: 4-Interacts appropriately 75 - 89% of the time - Needs redirection for appropriate language or to initiate interaction.  Problem Solving Problem Solving: 3-Solves basic 50 - 74% of the time/requires cueing 25 - 49% of the time  Memory Memory: 4-Recognizes or recalls 75 - 89% of the time/requires cueing 10 - 24% of the time   Medical Problem List and Plan:  1. DVT Prophylaxis/Anticoagulation: Pharmaceutical: Lovenox  2. Pain Management: Was on started on NSAIDS PTA due to left foot pain. Will monitor for symptoms with increase in activity levels. Continue prn tylenol for now.  3. Mood: patient with poor awareness of deficits. Will monitor for now. LCSW to follow for formal  evaluation. Feels down with poor appetite will trial lexapro 4. Neuropsych: This patient is not capable of making decisions on his/her own behalf.  5. LLL PNA: Treated with short course antibiotics. Residual mild leukocytosis 6. RUE:[id number]. Off antihypertensives at current time. Will increase hctz to 25mg . Check bmet monday 7. Focal motor seizures: continue keppra 1250 mg bid.  8. Insomnia/Hallucinations: Will discontinue seroquel as causing fatigue/daytime sedation. Will schedule trazodone at bedtime.  9. Symptomatic R-ICA stenosis: continue ASA and plavix. Neurology indicates for surgery past discharge? 10.  L Hemiplegic shoulder pain support, positioning cont OT, kinesiotape start meds if worsening  LOS (Days) 10 A FACE TO FACE EVALUATION WAS PERFORMED  SWARTZ,ZACHARY T 04/02/2012, 6:23 AM

## 2012-04-02 NOTE — Progress Notes (Signed)
Speech Language Pathology Daily Session Note  Patient Details  Name: Shaun Pugh MRN: [id number] Date of Birth: 1950-06-20  Today's Date: 04/02/2012 Time: 1330-1400 Time Calculation (min): 30 min  Short Term Goals: Week 2: SLP Short Term Goal 1 (Week 2): Patient will utilize small bites/sips and slow rate while consuming regular textures and thin liquids with min A multimodal cuing. SLP Short Term Goal 2 (Week 2): Patient will solve basic problems during functional tasks with mod A visual and verbal cueing.  SLP Short Term Goal 3 (Week 2): Patient will attend to the left during basic functional tasks with mod A verbal cuing. SLP Short Term Goal 4 (Week 2): Patient will demonstrate emergent awareness by asking for help when needed with max A verbal cueing.  SLP Short Term Goal 5 (Week 2): Patient will alternate attention between the clinician and a basic functional task with mod A verbal and visual cues for redirection.  SLP Short Term Goal 6 (Week 2): Patient will utilize external aids to facilitate recall of new, daily information with max A multimodal cueing.  Skilled Therapeutic Interventions: Treatment targeted cognitive goals.  SLP facilitated session with min A-supervision question cues to correctly sequence bed mobility to adjust patient to upright position in bed.  Patient required max A to visually scan to the left of the environment during a scanning task.   Patient exhibited selective attention for periods of approximately 1-2 minutes during basic functional tasks.     FIM:  Comprehension Comprehension Mode: Auditory Comprehension: 4-Understands basic 75 - 89% of the time/requires cueing 10 - 24% of the time Expression Expression Mode: Verbal Expression: 5-Expresses basic 90% of the time/requires cueing < 10% of the time. Social Interaction Social Interaction: 4-Interacts appropriately 75 - 89% of the time - Needs redirection for appropriate language or to initiate  interaction. Problem Solving Problem Solving: 3-Solves basic 50 - 74% of the time/requires cueing 25 - 49% of the time Memory Memory: 4-Recognizes or recalls 75 - 89% of the time/requires cueing 10 - 24% of the time FIM - Eating Eating Activity: 5: Supervision/cues  Pain Pain Assessment Pain Assessment: No/denies pain  Therapy/Group: Individual Therapy  Jackalyn Lombard, Conrad [place]  Graduate Clinician Speech Language Pathology  Sage Kopera, Joni Reining 04/02/2012, 3:00 PM

## 2012-04-02 NOTE — Progress Notes (Signed)
Physical Therapy Note  Patient Details  Name: Shaun Pugh MRN: [id number] Date of Birth: January 10, 1951 Today's Date: 04/02/2012  Time: 1000-1030 30 minutes  No c/o pain.  Pt required mod A supine to sit and squat pivot transfer bed <> w/c.  Treatment session focused on w/c mobility with L attention and awareness.  Pt able to propel w/c with supervision, requiring mod cuing for attention to L side and problem solving obstacle negotiation in w/c.  Individual therapy   DONAWERTH,KAREN 04/02/2012, 4:01 PM

## 2012-04-03 ENCOUNTER — Inpatient Hospital Stay (HOSPITAL_COMMUNITY): Payer: Medicare Other | Admitting: Physical Therapy

## 2012-04-03 NOTE — Progress Notes (Signed)
Physical Therapy Session Note  Patient Details  Name: TOLLIE GARGUS MRN: [id number] Date of Birth: 06-24-1950  Today's Date: 04/03/2012 Time: 0907-0950 Time Calculation (min): 43 min  Short Term Goals: Week 1:  PT Short Term Goal 1 (Week 1): Pt will move supine > sit with min assist from R. PT Short Term Goal 1 - Progress (Week 1): Met PT Short Term Goal 2 (Week 1): Pt will perform transfer bed><w/c with mod assist. PT Short Term Goal 2 - Progress (Week 1): Not met PT Short Term Goal 3 (Week 1): Pt will stand x 5 minutes during UE task at table,  with assist of 1 person PT Short Term Goal 3 - Progress (Week 1): Not met PT Short Term Goal 4 (Week 1): Pt will propel appropriate- height w/c x 50' with mod assist. PT Short Term Goal 4 - Progress (Week 1): Met Week 2:  PT Short Term Goal 1 (Week 2): Pt will move sit> supine with min assist. PT Short Term Goal 2 (Week 2): Pt will transfer bed/recliner >< w/c with mod assist. PT Short Term Goal 3 (Week 2): Pt will propel w/c x 150' with min assist. PT Short Term Goal 4 (Week 2): Pt will stand x 5 minutes at high table, max assist of 1 person. PT Short Term Goal 5 (Week 2): Pt will perform gait x 30' with assist of 1-2 people  Therapy Documentation Precautions:  Precautions Precautions: Fall Precaution Comments: Left sided weakness, right gaze preference, left inattention/neglect, left shoulder subluxation, Pusher tendencies  Restrictions Weight Bearing Restrictions: No Vital Signs: Therapy Vitals Temp: 97.9 F (36.6 C) Temp src: Oral Pulse Rate: 102  Resp: 18  BP: 113/81 mmHg Patient Position, if appropriate: Lying Oxygen Therapy SpO2: 95 % O2 Device: None (Room air) Pain: Pain Assessment Pain Assessment: No/denies pain Mobility:  Patient in bed; reports being fatigued this am.  Performed rolling in bed with bed rail with mod A to position LLE in flexion; mod A side to sit EOB with verbal cues for sequence to assist LLE off  EOB and mod A to bring trunk into full upright position at EOB.  Required min-mod A sitting balance EOB while donning shoes and reaching to each shoe to strap velcro.  Performed transfers squat pivot bed > w/c <> mat to L and R with verbal cues for initiation and sequencing but only required min-mod A to perform full pivot with improved visual scanning and attention to L.  Performed multiple sit <> stands from elevated mat during NMR with min A.     Other Treatments: Treatments Neuromuscular Facilitation: Left;Lower Extremity;Forced use;Activity to increase motor control;Activity to increase timing and sequencing;Activity to increase sustained activation;Activity to increase lateral weight shifting;Activity to increase anterior-posterior weight shifting in without UE support but with mod-max A at LLE to facilitate hip and knee extension during reaching forward and to the R to place rings on target and then remove from target for prolonged weight shift and sustained activation of LLE; patient required frequent sitting rest breaks secondary to fatigue.  Also required encouragement to participate today secondary to fatigue.   See FIM for current functional status  Therapy/Group: Individual Therapy  Edman Circle [facility] 04/03/2012, 10:03 AM

## 2012-04-03 NOTE — Consult Note (Signed)
  NEUROCOGNITIVE STATUS EXAMINATION - [facility]   [name] is a 61 year old man who was seen for a neurocognitive status examination to assess his emotional state and mental status post-stroke.  According to his medical record, he was admitted on 03/17/2012 with headache, left-sided weakness and numbness with slurred speech.  An initial CT of his head was negative, though a follow-up CCT revealed extensive area of low density affecting cortical and subcortical right hemisphere MCA distribution.  Neurologists felt that it was secondary to new thromboembolism from aortic arch thrombus.  Notably, his medical history also includes coronary artery disease, TIAs with left visual field deficits and dizziness, as well as chronic chiari malformation.    Emotional Functioning:  During the clinical interview, [name] admitted to anxiety regarding temporarily changing homes.  He also acknowledged feeling depressed regarding moving, stating that it felt as though he was "giving up everything we've worked for."  He stated that he has felt depressed other times throughout his lifetime and the depressed periods typically last for 3-4 weeks at a time.  However, he expressed strong aversion toward treatment options, including both medication and psychotherapy.  Of note, when we were speaking, he was quiet and would answer with as short of responses as possible.  Although he said that forcing him to talk about his emotions helped "a little," he had no interest in pursuing such options again in the future.    A measure of mood symptoms was indicative of the presence of moderate depression.     Mental Status:  [name]' total score on an overall measure of mental status was not suggestive of the presence of dementia (MMSE-2 brief = 12/16).  After immediately registering 3 words into memory, he was able to freely recall only 1 of 3 words after a brief delay.  He lost  additional points for disorientation to location (county and city).  Behaviorally, he demonstrated significant difficulty reading and filling out a simple mood questionnaire.  The examiner had to hold her finger in place so that he could keep track of where he was supposed to be reading.  However, he was persistent, did not want the questions read to him, and ultimately completed the measure on his own.    Summary and Recommendations:  During this session, [name] was reserved and yet, was persistent, attempting to complete anything that was asked of him.  My impression is that he has always been a quiet fellow who did not share his emotions freely.  He expressed that his preference is to wait out the depressed mood.  Given his extreme persistence filling out the mood questionnaire, it does not appear that waiting for depression to cease will adversely affect his ability to participate in treatment.  His treatment team should be aware that he will not likely admit when something is wrong or hard for him and he will instead, attempt to complete it to the best of his ability.  In physical therapy, especially, his treatment providers should watch for nonverbal signs (facial grimacing, etc.) that would suggest if he is being pushed past his limits.  Similarly, his providers should be aware that when they ask him to do something, he will likely give his full effort to do it; they just need to be patient.     DIAGNOSES: Stroke syndrome Cognitive disorder, NOS Adjustment disorder with depressed mood  Leavy Cella, Psy.D.  Clinical Neuropsychologist

## 2012-04-03 NOTE — Progress Notes (Signed)
Patient ID: Shaun Pugh, male   DOB: May 24, 1950, 61 y.o.   MRN: [id number] Shaun Pugh is a 61 y.o. RH-male with history of CAD, TIAs with left visual field deficits as well as dizziness/HA. Admitted on 03/17/12 with HA, left sided weakness and numbness with slurred speech. CT head negative and treated with tPA. CTA head/neck with hypodensity right superior gyrus, ulcerated plaque right ICA with 60% ICA stenosis, suspicion for 10mm focal thrombus proximal aortic arch. Patient developed combativeness with agitation as well as focal motor seizures LUE on 03/18/12. He was treated with ativan and started on Keppra. F/u CCT with extensive area of low density affecting cortical and subcortical right hemisphere in MCA distribution. 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 Placed on ASA and no anticoagulation due to large stroke and risk of bleeding. EEG without epileptiform activity. He did develop fevers and was started on antibiotics for LLL infiltrate. Carotid dopplers done revealing 60-79% ICA stenosis. 2D echo with EF 65-70%   Subjective/Complaints: No complaints today. Slept well. Denies pain in left shoulder   Review of Systems  Constitutional: Positive for malaise/fatigue.  Respiratory: Negative for cough.   Cardiovascular: Negative for chest pain.  Neurological: Positive for sensory change, speech change and focal weakness. Negative for seizures.  All other systems reviewed and are negative.    Objective: Vital Signs: Blood pressure 135/90, pulse 88, temperature 97.9 F (36.6 C), temperature source Oral, resp. rate 18, weight 117.7 kg (259 lb 7.7 oz), SpO2 95.00%. No results found. No results found for this or any previous visit (from the past 72 hour(s)).   Nursing note and vitals reviewed.  Constitutional: He appears well-developed and well-nourished.  HENT:  Head: Normocephalic and atraumatic.  Eyes: Pupils are equal, round,  and reactive to light.  Neck: Normal range of motion. Neck supple.  Cardiovascular: Normal rate and regular rhythm.  Pulmonary/Chest: Effort normal and breath sounds normal.  Abdominal: Soft. Bowel sounds are normal.  Musculoskeletal: He exhibits no edema.  Neurological: He is alert.  Oriented to self and situation, place, reason... Able to follow basic commands. Dense left hemiparesis.0/5 except 2-/5 L ankle PF , R side 5/5. Mild left inattention. Skin: Skin is warm and dry.  1/2 FB sublux on L, + Hawkins at 95 deg   Assessment/Plan: 1. Functional deficits secondary to Large R MCA infarct with L HP, L hemisensory def, cognitive deficits which require 3+ hours per day of interdisciplinary therapy in a comprehensive inpatient rehab setting. Physiatrist is providing close team supervision and 24 hour management of active medical problems listed below. Physiatrist and rehab team continue to assess barriers to discharge/monitor patient progress toward functional and medical goals. FIM: FIM - Bathing Bathing Steps Patient Completed: Chest;Right Arm;Left Arm;Abdomen;Front perineal area;Buttocks;Right upper leg;Left upper leg;Right lower leg (including foot);Left lower leg (including foot) Bathing: 3: Mod-Patient completes 5-7 39f 10 parts or 50-74%  FIM - Upper Body Dressing/Undressing Upper body dressing/undressing steps patient completed: Thread/unthread right sleeve of pullover shirt/dresss;Thread/unthread left sleeve of pullover shirt/dress;Put head through opening of pull over shirt/dress;Pull shirt over trunk Upper body dressing/undressing: 2: Max-Patient completed 25-49% of tasks FIM - Lower Body Dressing/Undressing Lower body dressing/undressing steps patient completed: Thread/unthread right pants leg;Thread/unthread left pants leg;Pull pants up/down;Don/Doff right sock;Don/Doff left sock;Don/Doff right shoe;Don/Doff left shoe;Fasten/unfasten right shoe;Fasten/unfasten left shoe Lower body  dressing/undressing: 1: Two helpers  FIM - Toileting Toileting steps completed by patient: Adjust clothing prior to toileting;Performs perineal hygiene;Adjust  clothing after toileting Toileting: 0: No continent bowel/bladder events this shift  FIM - Diplomatic Services operational officer Devices: Grab bars Toilet Transfers: 1-Two helpers  FIM - Banker Devices: Arm rests;Bed rails Bed/Chair Transfer: 2: Bed > Chair or W/C: Max A (lift and lower assist);2: Chair or W/C > Bed: Max A (lift and lower assist)  FIM - Locomotion: Wheelchair Distance: 120 Locomotion: Wheelchair: 2: Travels 50 - 149 ft with minimal assistance (Pt.>75%) FIM - Locomotion: Ambulation Locomotion: Ambulation Assistive Devices: Fara Boros Ambulation/Gait Assistance: 1: +2 Total assist Locomotion: Ambulation: 1: Travels less than 50 ft with total assistance/helper does all (Pt.<25%)  Comprehension Comprehension Mode: Auditory Comprehension: 4-Understands basic 75 - 89% of the time/requires cueing 10 - 24% of the time  Expression Expression Mode: Verbal Expression: 5-Expresses basic 90% of the time/requires cueing < 10% of the time.  Social Interaction Social Interaction: 4-Interacts appropriately 75 - 89% of the time - Needs redirection for appropriate language or to initiate interaction.  Problem Solving Problem Solving: 3-Solves basic 50 - 74% of the time/requires cueing 25 - 49% of the time  Memory Memory: 4-Recognizes or recalls 75 - 89% of the time/requires cueing 10 - 24% of the time   Medical Problem List and Plan:  1. DVT Prophylaxis/Anticoagulation: Pharmaceutical: Lovenox  2. Pain Management: Was on started on NSAIDS PTA due to left foot pain. Will monitor for symptoms with increase in activity levels. Continue prn tylenol for now.  3. Mood: patient with poor awareness of deficits. Will monitor for now. LCSW to follow for formal evaluation. Feels  down with poor appetite will trial lexapro 4. Neuropsych: This patient is not capable of making decisions on his/her own behalf.  5. LLL PNA: Treated with short course antibiotics. Residual mild leukocytosis 6. NGE:[id number]. Off antihypertensives at current time. Will increase hctz to 25mg . Check bmet monday 7. Focal motor seizures: continue keppra 1250 mg bid.  8. Insomnia/Hallucinations: Will discontinue seroquel as causing fatigue/daytime sedation. Will schedule trazodone at bedtime.  9. Symptomatic R-ICA stenosis: continue ASA and plavix. Neurology indicates for surgery past discharge? 10.  L Hemiplegic shoulder pain support, positioning cont OT, kinesiotape start meds if worsening-feeling better  LOS (Days) 11 A FACE TO FACE EVALUATION WAS PERFORMED  SWARTZ,ZACHARY T 04/03/2012, 7:25 AM

## 2012-04-04 ENCOUNTER — Inpatient Hospital Stay (HOSPITAL_COMMUNITY): Payer: Medicare Other

## 2012-04-04 ENCOUNTER — Inpatient Hospital Stay (HOSPITAL_COMMUNITY): Payer: Medicare Other | Admitting: Speech Pathology

## 2012-04-04 DIAGNOSIS — I6340 Cerebral infarction due to embolism of unspecified cerebral artery: Secondary | ICD-10-CM

## 2012-04-04 DIAGNOSIS — G8110 Spastic hemiplegia affecting unspecified side: Secondary | ICD-10-CM

## 2012-04-04 DIAGNOSIS — I69922 Dysarthria following unspecified cerebrovascular disease: Secondary | ICD-10-CM

## 2012-04-04 LAB — BASIC METABOLIC PANEL
Calcium: 10.5 mg/dL (ref 8.4–10.5)
Creatinine, Ser: 1.22 mg/dL (ref 0.50–1.35)
GFR calc Af Amer: 72 mL/min — ABNORMAL LOW (ref >90)
GFR calc non Af Amer: 62 mL/min — ABNORMAL LOW (ref >90)
Sodium: 133 mEq/L — ABNORMAL LOW (ref 135–145)

## 2012-04-04 NOTE — Progress Notes (Signed)
Nursing Note: Pt up to bsc after suppository given.Pt had bm but did not void.Last documented void at 0900.A: Pt scanned for 309.R:In and out cathed for 750 cc.Pt tolerated well and "felt better" afterwards.wbb

## 2012-04-04 NOTE — Progress Notes (Signed)
The skilled treatment note has been reviewed and SLP is in agreement. Shaw Dobek, M.A., CCC-SLP [phone]  

## 2012-04-04 NOTE — Progress Notes (Signed)
Patient ID: Shaun Pugh, male   DOB: Oct 03, 1950, 61 y.o.   MRN: [id number] Shaun Pugh is a 61 y.o. RH-male with history of CAD, TIAs with left visual field deficits as well as dizziness/HA. Admitted on 03/17/12 with HA, left sided weakness and numbness with slurred speech. CT head negative and treated with tPA. CTA head/neck with hypodensity right superior gyrus, ulcerated plaque right ICA with 60% ICA stenosis, suspicion for 10mm focal thrombus proximal aortic arch. Patient developed combativeness with agitation as well as focal motor seizures LUE on 03/18/12. He was treated with ativan and started on Keppra. F/u CCT with extensive area of low density affecting cortical and subcortical right hemisphere in MCA distribution. 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 Placed on ASA and no anticoagulation due to large stroke and risk of bleeding. EEG without epileptiform activity. He did develop fevers and was started on antibiotics for LLL infiltrate. Carotid dopplers done revealing 60-79% ICA stenosis. 2D echo with EF 65-70%   Subjective/Complaints: Appetite 50-60%  Review of Systems  Constitutional: Positive for malaise/fatigue.  Respiratory: Negative for cough.   Cardiovascular: Negative for chest pain.  Neurological: Positive for sensory change, speech change and focal weakness. Negative for seizures.  All other systems reviewed and are negative.    Objective: Vital Signs: Blood pressure 127/79, pulse 85, temperature 97.9 F (36.6 C), temperature source Oral, resp. rate 18, weight 117.7 kg (259 lb 7.7 oz), SpO2 96.00%. No results found. No results found for this or any previous visit (from the past 72 hour(s)).   Nursing note and vitals reviewed.  Constitutional: He appears well-developed and well-nourished.  HENT:  Head: Normocephalic and atraumatic.  Eyes: Pupils are equal, round, and reactive to light.  Neck: Normal range of  motion. Neck supple.  Cardiovascular: Normal rate and regular rhythm.  Pulmonary/Chest: Effort normal and breath sounds normal.  Abdominal: Soft. Bowel sounds are normal.  Musculoskeletal: He exhibits no edema.  Neurological: He is alert.  Oriented to self and situation, place, reason... Able to follow basic commands. Dense left hemiparesis.0/5 except 2-/5 L ankle PF , R side 5/5. Mild left inattention. Skin: Skin is warm and dry.  1/2 FB sublux on L, + Hawkins at 95 deg   Assessment/Plan: 1. Functional deficits secondary to Large R MCA infarct with L HP, L hemisensory def, cognitive deficits which require 3+ hours per day of interdisciplinary therapy in a comprehensive inpatient rehab setting. Physiatrist is providing close team supervision and 24 hour management of active medical problems listed below. Physiatrist and rehab team continue to assess barriers to discharge/monitor patient progress toward functional and medical goals. FIM: FIM - Bathing Bathing Steps Patient Completed: Chest;Right Arm;Left Arm;Abdomen;Front perineal area;Buttocks;Right upper leg;Left upper leg;Right lower leg (including foot);Left lower leg (including foot) Bathing: 1: Total-Patient completes 0-2 of 10 parts or less than 25%  FIM - Upper Body Dressing/Undressing Upper body dressing/undressing steps patient completed: Thread/unthread right sleeve of pullover shirt/dresss;Thread/unthread left sleeve of pullover shirt/dress;Put head through opening of pull over shirt/dress;Pull shirt over trunk Upper body dressing/undressing: 1: Total-Patient completed less than 25% of tasks FIM - Lower Body Dressing/Undressing Lower body dressing/undressing steps patient completed: Thread/unthread right pants leg;Thread/unthread left pants leg;Pull pants up/down;Don/Doff right sock;Don/Doff left sock;Don/Doff right shoe;Don/Doff left shoe;Fasten/unfasten right shoe;Fasten/unfasten left shoe Lower body dressing/undressing: 1:  Total-Patient completed less than 25% of tasks (per tech)  FIM - Toileting Toileting steps completed by patient: Adjust clothing prior to toileting;Performs  perineal hygiene;Adjust clothing after toileting Toileting: 0: Activity did not occur  FIM - Diplomatic Services operational officer Devices: Grab bars Toilet Transfers: 0-Activity did not occur  FIM - Banker Devices: Bed rails Bed/Chair Transfer: 3: Bed > Chair or W/C: Mod A (lift or lower assist);3: Chair or W/C > Bed: Mod A (lift or lower assist)  FIM - Locomotion: Wheelchair Distance: 120 Locomotion: Wheelchair: 1: Total Assistance/staff pushes wheelchair (Pt<25%) FIM - Locomotion: Ambulation Locomotion: Ambulation Assistive Devices: Interior and spatial designer Ambulation/Gait Assistance: 1: +2 Total assist Locomotion: Ambulation: 0: Activity did not occur  Comprehension Comprehension Mode: Auditory Comprehension: 4-Understands basic 75 - 89% of the time/requires cueing 10 - 24% of the time  Expression Expression Mode: Verbal Expression: 5-Expresses complex 90% of the time/cues < 10% of the time  Social Interaction Social Interaction: 5-Interacts appropriately 90% of the time - Needs monitoring or encouragement for participation or interaction.  Problem Solving Problem Solving: 4-Solves basic 75 - 89% of the time/requires cueing 10 - 24% of the time  Memory Memory: 4-Recognizes or recalls 75 - 89% of the time/requires cueing 10 - 24% of the time   Medical Problem List and Plan:  1. DVT Prophylaxis/Anticoagulation: Pharmaceutical: Lovenox  2. Pain Management: Was on started on NSAIDS PTA due to left foot pain. Will monitor for symptoms with increase in activity levels. Continue prn tylenol for now.  3. Mood: patient with poor awareness of deficits. Will monitor for now. LCSW to follow for formal evaluation. Feels down with poor appetite will trial lexapro 4. Neuropsych: This patient is  not capable of making decisions on his/her own behalf.  5. LLL PNA: Treated with short course antibiotics. Residual mild leukocytosis 6. ZOX:[id number]. Off antihypertensives at current time. Will increase hctz to 25mg . Check bmet monday 7. Focal motor seizures: continue keppra 1250 mg bid.  8. Insomnia/Hallucinations: Will discontinue seroquel as causing fatigue/daytime sedation. Will schedule trazodone at bedtime.  9. Symptomatic R-ICA stenosis: continue ASA and plavix. Neurology indicates for surgery past discharge? 10.  L Hemiplegic shoulder pain support, positioning cont OT, kinesiotape start meds if worsening-feeling better  LOS (Days) 12 A FACE TO FACE EVALUATION WAS PERFORMED  Freddrick Gladson E 04/04/2012, 7:14 AM

## 2012-04-04 NOTE — Progress Notes (Signed)
Physical Therapy Session Note  Patient Details  Name: Shaun Pugh MRN: [id number] Date of Birth: [birth year]  Today's Date: 04/04/2012 Time: 4098-1191 Time Calculation (min): 55 min  Short Term Goals: Week 2:  PT Short Term Goal 1 (Week 2): Pt will move sit> supine with min assist. PT Short Term Goal 2 (Week 2): Pt will transfer bed/recliner >< w/c with mod assist. PT Short Term Goal 3 (Week 2): Pt will propel w/c x 150' with min assist. PT Short Term Goal 4 (Week 2): Pt will stand x 5 minutes at high table, max assist of 1 person. PT Short Term Goal 5 (Week 2): Pt will perform gait x 30' with assist of 1-2 people  Skilled Therapeutic Interventions/Progress Updates:   Pt c/o nausea; stated he vomited before breakfast this AM; RN aware.  W/c mobility using hemi technique, RUE and LLE, x 110' with min assist for staying on R side of hall, turns.  neuromuscular re-education via forced use, VCS, demo for: -LLE wt bearing while in static stander, reaching with R hand for object to facilitate LLE extension, x 3 minutes, x 2. -Sit> stand at steps; static standing focusing on = wt bearing, reduced use of RUE support, as pt tends to rest on forearm -NuStep at level 4, focusing on LLE extension, control of hip rotation, without use of RUE, x 20 cycles, x 2.    Pt c/o dizziness throughout session, no worse when standing.  Affect very flat, and pt continually stating he needed to sit down. `  W/c>< NuStep squat pivot to R with min assist, to L with min/mod assist.  Returned to room, safety belt placed around pt, call bell and phone within reach.    Therapy Documentation Precautions:  Precautions Precautions: Fall Precaution Comments: Left sided weakness, right gaze preference, left inattention/neglect, left shoulder subluxation, Pusher tendencies  Restrictions Weight Bearing Restrictions: No    See FIM for current functional status Therapy/Group: Individual  Therapy  Rein Popov 04/04/2012, 3:55 PM

## 2012-04-04 NOTE — Progress Notes (Signed)
Speech Language Pathology Daily Session Note  Patient Details  Name: DONNAVIN VANDENBRINK MRN: [id number] Date of Birth: 04/02/1951  Today's Date: 04/04/2012 Time: 0910-0955 Time Calculation (min): 45 min  Short Term Goals: Week 2: SLP Short Term Goal 1 (Week 2): Patient will utilize small bites/sips and slow rate while consuming regular textures and thin liquids with min A multimodal cuing. SLP Short Term Goal 2 (Week 2): Patient will solve basic problems during functional tasks with mod A visual and verbal cueing.  SLP Short Term Goal 3 (Week 2): Patient will attend to the left during basic functional tasks with mod A verbal cuing. SLP Short Term Goal 4 (Week 2): Patient will demonstrate emergent awareness by asking for help when needed with max A verbal cueing.  SLP Short Term Goal 5 (Week 2): Patient will alternate attention between the clinician and a basic functional task with mod A verbal and visual cues for redirection.  SLP Short Term Goal 6 (Week 2): Patient will utilize external aids to facilitate recall of new, daily information with max A multimodal cueing.  Skilled Therapeutic Interventions: Treatment targeted cognition and dysphagia management.  Patient consumed regular textures and thin liquids with supervision question cues to self-monitor and correct left lateral sulci pocketing with a lingual sweep and exhibited no overt s/s of aspiration during session.  Patient required min-mod A verbal and visual cues for initiation during a basic familiar task.  Fatigue and/or motivation appeared to play a role in patient's initiation as evidenced by increase from min to mod A verbal and visual cuing at the end of the task.  Patient with increased sustained attention this session for periods of approximately 5 minutes with min A verbal cues for redirection.  SLP also facilitated session with mod A visual and verbal cues for complete scanning to the left in a visual scanning task. Overall, patient  required mod A multimodal cuing throughout session to pay attention to left environment.     FIM:  Comprehension Comprehension Mode: Auditory Comprehension: 4-Understands basic 75 - 89% of the time/requires cueing 10 - 24% of the time Expression Expression Mode: Verbal Expression: 5-Expresses basic 90% of the time/requires cueing < 10% of the time. Social Interaction Social Interaction: 4-Interacts appropriately 75 - 89% of the time - Needs redirection for appropriate language or to initiate interaction. Problem Solving Problem Solving: 4-Solves basic 75 - 89% of the time/requires cueing 10 - 24% of the time Memory Memory: 4-Recognizes or recalls 75 - 89% of the time/requires cueing 10 - 24% of the time FIM - Eating Eating Activity: 5: Supervision/cues;5: Set-up assist for open containers;5: Set-up assist for cut food  Pain Pain Assessment Pain Assessment: No/denies pain  Therapy/Group: Individual Therapy  Jackalyn Lombard, Conrad O'Fallon  Graduate Clinician Speech Language Pathology  Balbina Depace, Joni Reining 04/04/2012, 10:14 AM

## 2012-04-04 NOTE — Progress Notes (Signed)
Occupational Therapy Session Note  Patient Details  Name: Shaun Pugh MRN: [id number] Date of Birth: 1951-01-11  Today's Date: 04/04/2012 Time: 0730-0830 (1000-1030) Time Calculation (min): 60 min ( )  Short Term Goals: Week 2:  OT Short Term Goal 1 (Week 2): Patient will perform dressing with Max Assist. OT Short Term Goal 2 (Week 2): Patient will increase sitting balance from Poor+ to Fair- to increase functional performance during bathing and dressing. OT Short Term Goal 3 (Week 2): Patient will increase toilet transfer to Max Assist. OT Short Term Goal 4 (Week 2): Patient will initiate dressing and bathing tasks without vc's.  Skilled Therapeutic Interventions/Progress Updates:   Session 1: ADL re-training completed in shower this AM with rolling shower chair. Patient completed bed to w/c transfer with Max Assist this AM. Session with focus on ADL performance, functional transfers, left awareness, sitting and standing balance. Patient stood at bathroom bar to pull pants down. Incontinent urine episode in bathroom. Patient did not wait for therapist to get urinal. Patient became dizzy when getting dressed at sink and vomited in trash can. Small clear amount. Nursing was made aware. Patient was transferred to recliner at end of tx session. See below for additional exercises.  Session 2: Utilized card matching board to work on standing balance/tolerance, direction following, weightbearing to LUE on table top, and to increase left side awareness. Max Assist for sit to stand with NDT to left knee while standing. Max Assist to locate card on board especially on left side. 2 trials performed each trial patient stood for 2 minutes. Patient needed max vc's for encouragement to participate. Patient had very little interest in participating in therapy session. Educated patient on the importance of participating and trying to perform all activities asked of him to increase his functional  performance and allow his wife to take him home. Patient did not seem interested at all in trying and only wanted to return to his room.  Pt used right arm and leg to propel w/c from therapy gym to room with mod-max assist to increase RUE strength and endurance.    Therapy Documentation Precautions:  Precautions Precautions: Fall Precaution Comments: Left sided weakness, right gaze preference, left inattention/neglect, left shoulder subluxation, Pusher tendencies  Restrictions Weight Bearing Restrictions: No  Pain: Pain Assessment Pain Assessment: No/denies pain Exercises: General Exercises - Upper Extremity Shoulder Flexion: PROM;Left;5 reps;Seated Shoulder Extension: PROM;Left;5 reps;Seated Shoulder Horizontal ABduction: PROM;Left;5 reps;Seated Shoulder Horizontal ADduction: PROM;Left;5 reps;Seated Elbow Flexion: PROM;Left;5 reps;Seated Elbow Extension: PROM;Left;5 reps;Seated Wrist Flexion: PROM;Left;5 reps;Seated Wrist Extension: PROM;Left;5 reps;Seated Digit Composite Flexion: PROM;Left;5 reps;Seated Composite Extension: PROM;Left;5 reps;Seated Other Treatments:    See FIM for current functional status  Therapy/Group: Individual Therapy  Limmie Patricia, OTR/L 04/04/2012, 9:53 AM

## 2012-04-05 ENCOUNTER — Inpatient Hospital Stay (HOSPITAL_COMMUNITY): Payer: Medicare Other

## 2012-04-05 ENCOUNTER — Inpatient Hospital Stay (HOSPITAL_COMMUNITY): Payer: Medicare Other | Admitting: Speech Pathology

## 2012-04-05 DIAGNOSIS — G8110 Spastic hemiplegia affecting unspecified side: Secondary | ICD-10-CM

## 2012-04-05 DIAGNOSIS — I6340 Cerebral infarction due to embolism of unspecified cerebral artery: Secondary | ICD-10-CM

## 2012-04-05 DIAGNOSIS — I69922 Dysarthria following unspecified cerebrovascular disease: Secondary | ICD-10-CM

## 2012-04-05 MED ORDER — ENSURE COMPLETE PO LIQD
237.0000 mL | Freq: Two times a day (BID) | ORAL | Status: DC
  Administered 2012-04-05 – 2012-04-08 (×3): 237 mL via ORAL

## 2012-04-05 NOTE — Progress Notes (Signed)
Speech Language Pathology Daily Session Note  Patient Details  Name: Shaun Pugh MRN: [id number] Date of Birth: Dec 19, 1950  Today's Date: 04/05/2012 Time: 4098-1191 Time Calculation (min): 25 min  Short Term Goals: Week 2: SLP Short Term Goal 1 (Week 2): Patient will utilize small bites/sips and slow rate while consuming regular textures and thin liquids with min A multimodal cuing. SLP Short Term Goal 2 (Week 2): Patient will solve basic problems during functional tasks with mod A visual and verbal cueing.  SLP Short Term Goal 3 (Week 2): Patient will attend to the left during basic functional tasks with mod A verbal cuing. SLP Short Term Goal 4 (Week 2): Patient will demonstrate emergent awareness by asking for help when needed with max A verbal cueing.  SLP Short Term Goal 5 (Week 2): Patient will alternate attention between the clinician and a basic functional task with mod A verbal and visual cues for redirection.  SLP Short Term Goal 6 (Week 2): Patient will utilize external aids to facilitate recall of new, daily information with max A multimodal cueing.  Skilled Therapeutic Interventions: Treatment focus on cognitive goals. Pt asleep in the recliner upon arrival to the room and required verbal and tactile cues to arouse with Max encouragement to participate in treatment. Pt recalled rules to a previously learned task with Min A question cues and required Max A verbal and question cues for left attention to his environment and sustained attention for ~30 seconds to the task.    FIM:  Comprehension Comprehension Mode: Auditory Comprehension: 4-Understands basic 75 - 89% of the time/requires cueing 10 - 24% of the time Expression Expression Mode: Verbal Expression: 5-Expresses basic 90% of the time/requires cueing < 10% of the time. Social Interaction Social Interaction: 4-Interacts appropriately 75 - 89% of the time - Needs redirection for appropriate language or to initiate  interaction. Problem Solving Problem Solving: 3-Solves basic 50 - 74% of the time/requires cueing 25 - 49% of the time Memory Memory: 4-Recognizes or recalls 75 - 89% of the time/requires cueing 10 - 24% of the time  Pain Pain Assessment Pain Assessment: No/denies pain  Therapy/Group: Individual Therapy  Nickalus Thornsberry 04/05/2012, 4:01 PM

## 2012-04-05 NOTE — Progress Notes (Signed)
Physical Therapy Session Note  Patient Details  Name: Shaun Pugh MRN: [id number] Date of Birth: Feb 28, 1951  Today's Date: 04/05/2012 Time: 0930-1015 Time Calculation (min): 45 min (20 min PT) Short Term Goals: Week 1:  PT Short Term Goal 1 (Week 1): Pt will move supine > sit with min assist from R. PT Short Term Goal 1 - Progress (Week 1): Met PT Short Term Goal 2 (Week 1): Pt will perform transfer bed><w/c with mod assist. PT Short Term Goal 2 - Progress (Week 1): Not met PT Short Term Goal 3 (Week 1): Pt will stand x 5 minutes during UE task at table,  with assist of 1 person PT Short Term Goal 3 - Progress (Week 1): Not met PT Short Term Goal 4 (Week 1): Pt will propel appropriate- height w/c x 50' with mod assist. PT Short Term Goal 4 - Progress (Week 1): Met  Skilled Therapeutic Interventions/Progress Updates: Co-treat with OT with focus on seated balance for improved trunk stability and reduction of left sided neglect.  Sitting edge of mat with mod A to bring trunk upright.  Min-mod A for improved sitting balance with weight bearing through left UE while reaching with right UE across midline to move objects from left to right.  Trunk strengthening activities in sitting while weight bearing through left elbow and righting himself x5.  Trunk stabilization and strengthening with forward reaching and activity to the left with reaching activities.  Patient moved into supine to rest with complaint of increased nausea and increased BP at recommendation of RN.  Worked with patient on side/side bed mobility with verbal cues when rolling left; mod A to roll right; max A to transfer supine/sit and sit/pivot mat/WC.  Max A for sit/pivot transfer WC/recliner at conclusion of session.  Patient finished in recliner with safety belt in place.  Therapy Documentation Vital Signs: Therapy Vitals BP: 130/93 seated; 155/107 supine Pain: Pain Assessment Pain Assessment: No/denies pain Pain Score: 0-No  pain   See FIM for current functional status  Therapy/Group: Co-Treatment  Newman Pies A 04/05/2012, 12:12 PM

## 2012-04-05 NOTE — Progress Notes (Signed)
Patient unable to void.  Scan results are 464 ml.  In and out cath results are 800 ml.  Patient tolerated well.

## 2012-04-05 NOTE — Progress Notes (Signed)
Patient ID: Shaun Pugh, male   DOB: 10/25/1950, 61 y.o.   MRN: [id number] Shaun Pugh is a 61 y.o. RH-male with history of CAD, TIAs with left visual field deficits as well as dizziness/HA. Admitted on 03/17/12 with HA, left sided weakness and numbness with slurred speech. CT head negative and treated with tPA. CTA head/neck with hypodensity right superior gyrus, ulcerated plaque right ICA with 60% ICA stenosis, suspicion for 10mm focal thrombus proximal aortic arch. Patient developed combativeness with agitation as well as focal motor seizures LUE on 03/18/12. He was treated with ativan and started on Keppra. F/u CCT with extensive area of low density affecting cortical and subcortical right hemisphere in MCA distribution. 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 Placed on ASA and no anticoagulation due to large stroke and risk of bleeding. EEG without epileptiform activity. He did develop fevers and was started on antibiotics for LLL infiltrate. Carotid dopplers done revealing 60-79% ICA stenosis. 2D echo with EF 65-70%   Subjective/Complaints: Doesn't want to take Trazodone or Mucinex Required urinary catheter last noc  Review of Systems  Constitutional: Positive for malaise/fatigue.  Respiratory: Negative for cough.   Cardiovascular: Negative for chest pain.  Neurological: Positive for sensory change, speech change and focal weakness. Negative for seizures.  All other systems reviewed and are negative.    Objective: Vital Signs: Blood pressure 145/76, pulse 88, temperature 97.3 F (36.3 C), temperature source Oral, resp. rate 18, weight 106.2 kg (234 lb 2.1 oz), SpO2 98.00%. No results found. Results for orders placed during the hospital encounter of 03/23/12 (from the past 72 hour(s))  BASIC METABOLIC PANEL     Status: Abnormal   Collection Time   04/04/12  8:28 AM      Component Value Range Comment   Sodium 133 (*) 135 -  145 mEq/L    Potassium 4.1  3.5 - 5.1 mEq/L    Chloride 94 (*) 96 - 112 mEq/L    CO2 22  19 - 32 mEq/L    Glucose, Bld 144 (*) 70 - 99 mg/dL    BUN 25 (*) 6 - 23 mg/dL    Creatinine, Ser 4.09  0.50 - 1.35 mg/dL    Calcium 81.1  8.4 - 10.5 mg/dL    GFR calc non Af Amer 62 (*) >90 mL/min    GFR calc Af Amer 72 (*) >90 mL/min      Nursing note and vitals reviewed.  Constitutional: He appears well-developed and well-nourished.  HENT:  Head: Normocephalic and atraumatic.  Eyes: Pupils are equal, round, and reactive to light.  Neck: Normal range of motion. Neck supple.  Cardiovascular: Normal rate and regular rhythm.  Pulmonary/Chest: Effort normal and breath sounds normal.  Abdominal: Soft. Bowel sounds are normal.  Musculoskeletal: He exhibits no edema.  Neurological: He is alert.  Oriented to self and situation, place, reason... Able to follow basic commands. Dense left hemiparesis.0/5 except 2-/5 L ankle PF , R side 5/5. Mild left inattention. Skin: Skin is warm and dry.  1/2 FB sublux on L, + Hawkins at 95 deg   Assessment/Plan: 1. Functional deficits secondary to Large R MCA infarct with L HP, L hemisensory def, cognitive deficits which require 3+ hours per day of interdisciplinary therapy in a comprehensive inpatient rehab setting. Physiatrist is providing close team supervision and 24 hour management of active medical problems listed below. Physiatrist and rehab team continue to assess barriers to discharge/monitor patient  progress toward functional and medical goals. FIM: FIM - Bathing Bathing Steps Patient Completed: Chest;Right Arm;Left Arm;Abdomen;Front perineal area;Buttocks;Right upper leg;Left upper leg;Right lower leg (including foot);Left lower leg (including foot) Bathing: 3: Mod-Patient completes 5-7 44f 10 parts or 50-74%  FIM - Upper Body Dressing/Undressing Upper body dressing/undressing steps patient completed: Thread/unthread right sleeve of pullover  shirt/dresss;Thread/unthread left sleeve of pullover shirt/dress;Put head through opening of pull over shirt/dress;Pull shirt over trunk Upper body dressing/undressing: 2: Max-Patient completed 25-49% of tasks FIM - Lower Body Dressing/Undressing Lower body dressing/undressing steps patient completed: Thread/unthread right pants leg;Thread/unthread left pants leg;Pull pants up/down;Don/Doff right sock;Don/Doff left sock;Don/Doff right shoe;Don/Doff left shoe;Fasten/unfasten right shoe;Fasten/unfasten left shoe Lower body dressing/undressing: 1: Total-Patient completed less than 25% of tasks  FIM - Toileting Toileting steps completed by patient: Adjust clothing prior to toileting;Performs perineal hygiene;Adjust clothing after toileting Toileting: 0: No continent bowel/bladder events this shift  FIM - Diplomatic Services operational officer Devices: Grab bars Toilet Transfers: 0-Activity did not occur  FIM - Banker Devices: Bed rails Bed/Chair Transfer: 3: Supine > Sit: Mod A (lifting assist/Pt. 50-74%/lift 2 legs;2: Bed > Chair or W/C: Max A (lift and lower assist)  FIM - Locomotion: Wheelchair Distance: 120 Locomotion: Wheelchair: 2: Travels 50 - 149 ft with minimal assistance (Pt.>75%) FIM - Locomotion: Ambulation Locomotion: Ambulation Assistive Devices: Fara Boros Ambulation/Gait Assistance: 1: +2 Total assist Locomotion: Ambulation: 0: Activity did not occur  Comprehension Comprehension Mode: Auditory Comprehension: 4-Understands basic 75 - 89% of the time/requires cueing 10 - 24% of the time  Expression Expression Mode: Verbal Expression: 4-Expresses basic 75 - 89% of the time/requires cueing 10 - 24% of the time. Needs helper to occlude trach/needs to repeat words.  Social Interaction Social Interaction: 4-Interacts appropriately 75 - 89% of the time - Needs redirection for appropriate language or to initiate  interaction.  Problem Solving Problem Solving: 3-Solves basic 50 - 74% of the time/requires cueing 25 - 49% of the time  Memory Memory: 3-Recognizes or recalls 50 - 74% of the time/requires cueing 25 - 49% of the time   Medical Problem List and Plan:  1. DVT Prophylaxis/Anticoagulation: Pharmaceutical: Lovenox  2. Pain Management: Was on started on NSAIDS PTA due to left foot pain. Will monitor for symptoms with increase in activity levels. Continue prn tylenol for now.  3. Mood: patient with poor awareness of deficits. Will monitor for now. LCSW to follow for formal evaluation. Feels down with poor appetite will trial lexapro 4. Neuropsych: This patient is not capable of making decisions on his/her own behalf.  5. LLL PNA: Treated with short course antibiotics. Residual mild leukocytosis 6. ZOX:[id number]. Off antihypertensives at current time. Will increase hctz to 25mg . Check bmet monday 7. Focal motor seizures: continue keppra 1250 mg bid.  8. Insomnia/Hallucinations:  scheduled trazodone at bedtime may contribute to retention will D/C.  9. Symptomatic R-ICA stenosis: continue ASA and plavix. Neurology indicates for surgery past discharge? 10.  L Hemiplegic shoulder pain support, positioning cont OT, kinesiotape start meds if worsening-feeling better  LOS (Days) 13 A FACE TO FACE EVALUATION WAS PERFORMED  Marquitta Persichetti E 04/05/2012, 7:19 AM

## 2012-04-05 NOTE — Progress Notes (Signed)
Physical Therapy Weekly Progress Note  Patient Details  Name: Shaun Pugh MRN: [id number] Date of Birth: Mar 23, 1951  Today's Date: 04/05/2012 Time: 1100-1200 Time Calculation (min): 60 min  Patient has met 4 of 5 short term goals.  Patient is progressing in all areas of mobility. Patient did not tolerate standing for 5 minutes as he complained of dizziness. Due to episode of high Bp earlier today, did not want to push patient.    Patient continues to demonstrate the following deficits: hemiplegia left extremities, left side neglect, pusher to left, dependence in functional mobility and therefore will continue to benefit from skilled PT intervention to enhance overall performance with activity tolerance, balance, postural control, ability to compensate for deficits and functional use of  left upper extremity and left lower extremity.  Patient progressing toward long term goals..  Continue plan of care.  PT Short Term Goals Week 2:  PT Short Term Goal 1 (Week 2): Pt will move sit> supine with min assist. PT Short Term Goal 1 - Progress (Week 2): Met PT Short Term Goal 2 (Week 2): Pt will transfer bed/recliner >< w/c with mod assist. PT Short Term Goal 2 - Progress (Week 2): Met PT Short Term Goal 3 (Week 2): Pt will propel w/c x 150' with min assist. PT Short Term Goal 3 - Progress (Week 2): Met PT Short Term Goal 4 (Week 2): Pt will stand x 5 minutes at high table, max assist of 1 person. PT Short Term Goal 4 - Progress (Week 2): Progressing toward goal PT Short Term Goal 5 (Week 2): Pt will perform gait x 30' with assist of 1-2 people PT Short Term Goal 5 - Progress (Week 2): Met Week 3:  PT Short Term Goal 1 (Week 3): Patient will move supine to and from sit with minimal steadying assist and verbal cueing PT Short Term Goal 2 (Week 3): Patient will transfer to right and left with minimal assistance. PT Short Term Goal 3 (Week 3): Patient will propel wheelchair 150 feet with supervision  and minimal verbal cueing PT Short Term Goal 4 (Week 3): Patient will stand 5 minutes with +1 max assist with UE support PT Short Term Goal 5 (Week 3): Patient will ambulate 25 feet with +1 max assist and least restrictive assistive device.  Skilled Therapeutic Interventions/Progress Updates:  Patient transferred from recliner to wheelchair with moderate assistance to control descent. Patient ambulated with EVA walker 30 feet with +1 max assist for balance and facilitation of left LE and +1 assist to guide/control walker. Patient was able to progress left LE about 10 times during the walk without assistance. Patient also was able to weight bear on left LE without blocking knee from buckling. Patient stood in standing frame x 2 minutes to work on left LE control. Patient reported dizziness and was lowered. Bp was 139/88. Patient transferred to mat with +1 moderate assist. Patient sit to supine with minimal assistance for left LE. Patient performed active assistive exercise for hip flexion/extension and knee extension. Patient supine to sitting with minimal assistance and verbal cueing for sequencing. Patient transferred back to wheelchair going to left with moderate assist. Patient propelled wheelchair back to room with right extremities and minimal assistance to avoid objects on left. Patient transferred to recliner with moderate assist.    Therapy Documentation Precautions:  Precautions Precautions: Fall Precaution Comments: Left sided weakness, right gaze preference, left inattention/neglect, left shoulder subluxation, Pusher tendencies  Restrictions Weight Bearing Restrictions: No  Pain:  Pain Assessment Pain Assessment: No/denies pain  Locomotion : Ambulation Ambulation/Gait Assistance: 1: +2 Total assist   See FIM for current functional status  Therapy/Group: Individual Therapy  Alma Friendly 04/05/2012, 3:15 PM

## 2012-04-05 NOTE — Progress Notes (Signed)
Nutrition Follow-up  Intervention:   Encouraged intake Continue current diet with high protein shakes with meals Add Ensure Complete bid  Assessment:   Saw patient on meal rounds.  Intake remains poor.  Pt sleeping and lunch remains untouched.  Not always getting shakes on trays.  Intake 25-50% meals.  Diet Order:  Reg with Carnation Instant Breakfast or High protein Shake tid with meals.  Meds: Scheduled Meds:   . aspirin EC  325 mg Oral Daily  . atorvastatin  20 mg Oral q1800  . clopidogrel  75 mg Oral Q breakfast  . enoxaparin  40 mg Subcutaneous Q24H  . escitalopram  5 mg Oral Daily  . fluticasone  1 spray Each Nare Daily  . hydrochlorothiazide  25 mg Oral Daily  . levETIRAcetam  1,250 mg Oral BID  . pantoprazole  40 mg Oral Daily  . potassium chloride  10 mEq Oral BID  . senna-docusate  2 tablet Oral BID  . [DISCONTINUED] dextromethorphan-guaiFENesin  1 tablet Oral BID  . [DISCONTINUED] traZODone  50 mg Oral QHS   Continuous Infusions:  PRN Meds:.acetaminophen, albuterol, alum & mag hydroxide-simeth, bisacodyl, guaiFENesin-dextromethorphan, ipratropium, polyethylene glycol, prochlorperazine, prochlorperazine, prochlorperazine, [DISCONTINUED] diphenhydrAMINE   CMP     Component Value Date/Time   NA 133* 04/04/2012 0828   K 4.1 04/04/2012 0828   CL 94* 04/04/2012 0828   CO2 22 04/04/2012 0828   GLUCOSE 144* 04/04/2012 0828   BUN 25* 04/04/2012 0828   CREATININE 1.22 04/04/2012 0828   CALCIUM 10.5 04/04/2012 0828   PROT 7.2 03/24/2012 0615   ALBUMIN 3.1* 03/24/2012 0615   AST 19 03/24/2012 0615   ALT 35 03/24/2012 0615   ALKPHOS 61 03/24/2012 0615   BILITOT 0.6 03/24/2012 0615   GFRNONAA 62* 04/04/2012 0828   GFRAA 72* 04/04/2012 0828    CBG (last 3)  No results found for this basename: GLUCAP:3 in the last 72 hours   Intake/Output Summary (Last 24 hours) at 04/05/12 1403 Last data filed at 04/04/12 2240  Gross per 24 hour  Intake    120 ml  Output   1501  ml  Net  -1381 ml    Weight Status:  106.2 kg decreased from 117.7 kg 11/20.  Re-estimated needs:  2100-2300 kcal, 110-120 gm protein  Nutrition Dx:  Inadequate oral intake r/t decreased appetite continues AEB reported and documented intake  Goal:  Intake of >75% meals and supplements  Monitor:  Intake, labs, weight   Oran Rein, RD, LDN Clinical Inpatient Dietitian Pager:  [phone] Weekend and after hours pager:  [phone]

## 2012-04-05 NOTE — Progress Notes (Addendum)
Occupational Therapy Session Note  Patient Details  Name: Shaun Pugh MRN: [id number] Date of Birth: Apr 13, 1951  Today's Date: 04/05/2012 Time: 1478-2956 [phone] Time Calculation (min): 25 min ( )  Short Term Goals: Week 2:  OT Short Term Goal 1 (Week 2): Patient will perform dressing with Max Assist. OT Short Term Goal 2 (Week 2): Patient will increase sitting balance from Poor+ to Fair- to increase functional performance during bathing and dressing. OT Short Term Goal 3 (Week 2): Patient will increase toilet transfer to Max Assist. OT Short Term Goal 4 (Week 2): Patient will initiate dressing and bathing tasks without vc's.  Skilled Therapeutic Interventions/Progress Updates:   Session 1: ADL re-training completed in shower this AM with rolling shower chair. Patient required max vc's for encouragement to participate. Patient's attitude during therapy was disrespectful. Pt continues to joke and not take his present situation seriously. Patient completed bed to w/c transfer with Max Assist this AM. Session with focus on ADL performance, functional transfers, left awareness, sitting and standing balance. Patient stood at bathroom bar to pull pants down. Patient requested to use toilet before shower and had a continent BM. Patient assisted with LB dressing with Max vc's and completed UB dressing with constant vc's for encouragement, technique, and to remain on task.  Session 2: Co-tx with PTA this session. Pt performed functional transfer from w/c to mat table with Max Assist; NDT to left knee, towards right side.  Max vc's to increase awareness to left side by looking at self in mirror. Pt unable to sustain gaze for longer than 15 seconds to left before returning to a gaze preference down and to the right.Completed weigtbearing technique to LUE. See below.  Dynamic sitting activity performed with deck of cards and numbered post-its on back of floor mirror. Pt required to pick card and  locate appropriate post-it by reaching and touching on left side. Pt completed 3 cards successfully before complaining of dizziness. BP was taken in sitting and supine. Reported vitals to nursing who recommended pt transfer to recliner at end of tx session.    Therapy Documentation Precautions:  Precautions Precautions: Fall Precaution Comments: Left sided weakness, right gaze preference, left inattention/neglect, left shoulder subluxation, Pusher tendencies  Restrictions Weight Bearing Restrictions: No  Pain: Pain Assessment Pain Assessment: No/denies pain Pain Score: 0-No pain Exercises:   Other Treatments: Weight Bearing Technique Weight Bearing Technique: Yes LUE Weight Bearing Technique: Forearm seated;Extended arm seated Response to Weight Bearing Technique: tightness in LUE forearm and wrist  See FIM for current functional status  Therapy/Group: Individual Therapy session 1 Co-tx therapy session 2  Limmie Patricia, OTR/L 04/05/2012, 10:45 AM

## 2012-04-06 ENCOUNTER — Inpatient Hospital Stay (HOSPITAL_COMMUNITY): Payer: Medicare Other

## 2012-04-06 ENCOUNTER — Inpatient Hospital Stay (HOSPITAL_COMMUNITY): Payer: Medicare Other | Admitting: Physical Therapy

## 2012-04-06 ENCOUNTER — Inpatient Hospital Stay (HOSPITAL_COMMUNITY): Payer: Medicare Other | Admitting: Speech Pathology

## 2012-04-06 LAB — URINE MICROSCOPIC-ADD ON

## 2012-04-06 LAB — URINALYSIS, ROUTINE W REFLEX MICROSCOPIC
Bilirubin Urine: NEGATIVE
Glucose, UA: NEGATIVE mg/dL
Protein, ur: NEGATIVE mg/dL
Urobilinogen, UA: 1 mg/dL (ref 0.0–1.0)

## 2012-04-06 LAB — CREATININE, SERUM
Creatinine, Ser: 1.22 mg/dL (ref 0.50–1.35)
GFR calc non Af Amer: 62 mL/min — ABNORMAL LOW (ref >90)

## 2012-04-06 MED ORDER — METHYLPHENIDATE HCL 5 MG PO TABS
5.0000 mg | ORAL_TABLET | Freq: Two times a day (BID) | ORAL | Status: DC
  Administered 2012-04-06 – 2012-04-13 (×14): 5 mg via ORAL
  Filled 2012-04-06 (×14): qty 1

## 2012-04-06 MED ORDER — TAMSULOSIN HCL 0.4 MG PO CAPS
0.4000 mg | ORAL_CAPSULE | Freq: Every day | ORAL | Status: DC
  Administered 2012-04-06 – 2012-04-11 (×6): 0.4 mg via ORAL
  Filled 2012-04-06 (×7): qty 1

## 2012-04-06 NOTE — Patient Care Conference (Signed)
Inpatient RehabilitationTeam Conference Note Date: 04/06/2012   Time: 10:50 AM    Patient Name: Shaun Pugh      Medical Record Number: [id number]  Date of Birth: Oct 30, 1950 Sex: Male         Room/Bed: 4028/4028-01 Payor Info: Payor: [employer] [place] [employer]IC[employer]  Plan: BLUE MEDICARE  Product Type: *No Product type*     Admitting Diagnosis: CVA  Admit Date/Time:  03/23/2012  6:54 PM Admission Comments: No comment available   Primary Diagnosis:  Stroke, acute, embolic Principal Problem: Stroke, acute, embolic  Patient Active Problem List   Diagnosis Date Noted  . Cytotoxic cerebral edema 03/19/2012  . Aortic thromboembolism 03/19/2012  . Stroke, acute, embolic, right 03/18/2012  . Seizure 03/18/2012  . Stented coronary artery   . Hemiplegia, unspecified, affecting nondominant side 03/17/2012  . Atrophy of right kidney 09/15/2011  . Kidney calculi 09/15/2011  . Acute pancreatitis 09/14/2011  . Cholelithiasis 09/14/2011  . Hyperglycemia 09/14/2011  . Current every day smoker 09/14/2011    Expected Discharge Date: Expected Discharge Date: 04/20/12  Team Members Present: Physician: Dr. Claudette Laws Social Worker Present: Dossie Der, LCSW Nurse Present: Rosalio Macadamia, RN PT Present: Wanda Plump, PT;Other (comment) Clarisse Gouge Ripa-PT) OT Present: Bretta Bang, OT SLP Present: Fae Pippin, SLP Other (Discipline and Name): Charolette Child Coordinator     Current Status/Progress Goal Weekly Team Focus  Medical   urinary retention  complete bladder emptying  adjust meds, up to commode   Bowel/Bladder   continent of bowel; requires in and out caths due to large volume bladder scans; voids at times  to keep low volume bladder scans  to remain continent of bowel and bladder   Swallow/Nutrition/ Hydration   regular textures, thin liquids full supervision during meals due to cognitive deficits  least restrictive p.o. intake   increase attention and initiation     ADL's   Patient is Supervision for grooming; Mod Assist for bathing; Max Assist for dressing; Total Assist for toileting. Max Assist for toilet/shower transfer. Flaccid LUE. Left side physical and visual neglect. Decreased motivation.  Min assist overall  Family education, ADL performance, functional transfers, left awareness    Mobility   +1 moderate assistance for basic transfers and bed mobility   min assist transfers, gait x 25', up /down 3 steps; supervision bed mobility and w/c mobility x 150'   begin family education with wife on bed mobility and transfers   Communication   mod assist for comprehension   supervision-min with basic   increase sustained attention and initiation during functional tasks    Safety/Cognition/ Behavioral Observations  mod-max assist due to initiation and attentional deficits; able to sustain attention for 5 minute periods with no distractions present   min assist  increase sustained attention and initiation during functional tasks    Pain   denies pain;  Tylenol 325-650 mg prn  to remain free of pain  to remain free of pain   Skin   skin intact  to remain free of new skin breakdown  to remain free of new skin breakdown      *See Interdisciplinary Assessment and Plan and progress notes for long and short-term goals  Barriers to Discharge: max assist care l    Possible Resolutions to Barriers:  upgrade fxn to Min A    Discharge Planning/Teaching Needs:  Home with wife who has found a one level place to stay that is accessible.  Wife here to observe and  participate in therapies      Team Discussion:  Making progress, slowly. Question brought up if Ritalin would help-MD considering.  Begin family education with wife next week  Revisions to Treatment Plan:  None   Continued Need for Acute Rehabilitation Level of Care: The patient requires daily medical management by a physician with specialized training in physical medicine and rehabilitation for the  following conditions: Daily direction of a multidisciplinary physical rehabilitation program to ensure safe treatment while eliciting the highest outcome that is of practical value to the patient.: Yes Daily medical management of patient stability for increased activity during participation in an intensive rehabilitation regime.: Yes Daily analysis of laboratory values and/or radiology reports with any subsequent need for medication adjustment of medical intervention for : Neurological problems  Alandria Butkiewicz, Lemar Livings 04/06/2012, 1:00 PM

## 2012-04-06 NOTE — Progress Notes (Signed)
Patient ID: Shaun Pugh, male   DOB: 08-Feb-1951, 61 y.o.   MRN: [id number] Shaun Pugh is a 61 y.o. RH-male with history of CAD, TIAs with left visual field deficits as well as dizziness/HA. Admitted on 03/17/12 with HA, left sided weakness and numbness with slurred speech. CT head negative and treated with tPA. CTA head/neck with hypodensity right superior gyrus, ulcerated plaque right ICA with 60% ICA stenosis, suspicion for 10mm focal thrombus proximal aortic arch. Patient developed combativeness with agitation as well as focal motor seizures LUE on 03/18/12. He was treated with ativan and started on Keppra. F/u CCT with extensive area of low density affecting cortical and subcortical right hemisphere in MCA distribution. 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 Placed on ASA and no anticoagulation due to large stroke and risk of bleeding. EEG without epileptiform activity. He did develop fevers and was started on antibiotics for LLL infiltrate. Carotid dopplers done revealing 60-79% ICA stenosis. 2D echo with EF 65-70%   Subjective/Complaints: Required I/O cath, no burning  Review of Systems  Constitutional: Positive for malaise/fatigue.  Respiratory: Negative for cough.   Cardiovascular: Negative for chest pain.  Neurological: Positive for sensory change, speech change and focal weakness. Negative for seizures.  All other systems reviewed and are negative.    Objective: Vital Signs: Blood pressure 138/79, pulse 85, temperature 98 F (36.7 C), temperature source Oral, resp. rate 18, weight 106.2 kg (234 lb 2.1 oz), SpO2 95.00%. No results found. Results for orders placed during the hospital encounter of 03/23/12 (from the past 72 hour(s))  BASIC METABOLIC PANEL     Status: Abnormal   Collection Time   04/04/12  8:28 AM      Component Value Range Comment   Sodium 133 (*) 135 - 145 mEq/L    Potassium 4.1  3.5 - 5.1 mEq/L    Chloride 94 (*) 96 - 112 mEq/L    CO2 22  19 - 32 mEq/L    Glucose, Bld 144 (*) 70 - 99 mg/dL    BUN 25 (*) 6 - 23 mg/dL    Creatinine, Ser 3.08  0.50 - 1.35 mg/dL    Calcium 65.7  8.4 - 10.5 mg/dL    GFR calc non Af Amer 62 (*) >90 mL/min    GFR calc Af Amer 72 (*) >90 mL/min   CREATININE, SERUM     Status: Abnormal   Collection Time   04/06/12  6:03 AM      Component Value Range Comment   Creatinine, Ser 1.22  0.50 - 1.35 mg/dL    GFR calc non Af Amer 62 (*) >90 mL/min    GFR calc Af Amer 72 (*) >90 mL/min      Nursing note and vitals reviewed.  Constitutional: He appears well-developed and well-nourished.  HENT:  Head: Normocephalic and atraumatic.  Eyes: Pupils are equal, round, and reactive to light.  Neck: Normal range of motion. Neck supple.  Cardiovascular: Normal rate and regular rhythm.  Pulmonary/Chest: Effort normal and breath sounds normal.  Abdominal: Soft. Bowel sounds are normal.  Musculoskeletal: He exhibits no edema.  Neurological: He is alert.  Oriented to self and situation, place, reason... Able to follow basic commands. Dense left hemiparesis.0/5 LUE except 1/5 L triceps  2-/5 L HE/KE and  ankle PF , R side 5/5. Mild left inattention. Skin: Skin is warm and dry.  1/2 FB sublux on L,    Assessment/Plan: 1.  Functional deficits secondary to Large R MCA infarct with L HP, L hemisensory def, cognitive deficits which require 3+ hours per day of interdisciplinary therapy in a comprehensive inpatient rehab setting. Physiatrist is providing close team supervision and 24 hour management of active medical problems listed below. Physiatrist and rehab team continue to assess barriers to discharge/monitor patient progress toward functional and medical goals. FIM: FIM - Bathing Bathing Steps Patient Completed: Chest;Right Arm;Left Arm;Abdomen;Front perineal area;Buttocks;Right upper leg;Left upper leg;Right lower leg (including foot);Left lower leg (including  foot) Bathing: 3: Mod-Patient completes 5-7 35f 10 parts or 50-74%  FIM - Upper Body Dressing/Undressing Upper body dressing/undressing steps patient completed: Thread/unthread right sleeve of pullover shirt/dresss;Thread/unthread left sleeve of pullover shirt/dress;Put head through opening of pull over shirt/dress;Pull shirt over trunk Upper body dressing/undressing: 2: Max-Patient completed 25-49% of tasks FIM - Lower Body Dressing/Undressing Lower body dressing/undressing steps patient completed: Thread/unthread right pants leg;Thread/unthread left pants leg;Pull pants up/down;Don/Doff right sock;Don/Doff left sock;Don/Doff right shoe;Don/Doff left shoe;Fasten/unfasten right shoe;Fasten/unfasten left shoe Lower body dressing/undressing: 2: Max-Patient completed 25-49% of tasks  FIM - Toileting Toileting steps completed by patient: Adjust clothing prior to toileting;Performs perineal hygiene;Adjust clothing after toileting Toileting: 1: Total-Patient completed zero steps, helper did all 3  FIM - Diplomatic Services operational officer Devices: Grab bars Toilet Transfers: 1-To toilet/BSC: Total A (helper does all/Pt. < 25%);1-From toilet/BSC: Total A (helper does all/Pt. < 25%)  FIM - Bed/Chair Transfer Bed/Chair Transfer Assistive Devices: Bed rails Bed/Chair Transfer: 4: Supine > Sit: Min A (steadying Pt. > 75%/lift 1 leg);4: Sit > Supine: Min A (steadying pt. > 75%/lift 1 leg);3: Bed > Chair or W/C: Mod A (lift or lower assist);3: Chair or W/C > Bed: Mod A (lift or lower assist)  FIM - Locomotion: Wheelchair Distance: 120 Locomotion: Wheelchair: 0: Activity did not occur FIM - Locomotion: Ambulation Locomotion: Ambulation Assistive Devices: Interior and spatial designer Ambulation/Gait Assistance: 1: +2 Total assist Locomotion: Ambulation: 1: Travels less than 50 ft with maximal assistance (Pt: 25 - 49%)  Comprehension Comprehension Mode: Auditory Comprehension: 4-Understands basic 75 - 89% of  the time/requires cueing 10 - 24% of the time  Expression Expression Mode: Verbal Expression: 5-Expresses basic 90% of the time/requires cueing < 10% of the time.  Social Interaction Social Interaction: 4-Interacts appropriately 75 - 89% of the time - Needs redirection for appropriate language or to initiate interaction.  Problem Solving Problem Solving: 3-Solves basic 50 - 74% of the time/requires cueing 25 - 49% of the time  Memory Memory: 4-Recognizes or recalls 75 - 89% of the time/requires cueing 10 - 24% of the time   Medical Problem List and Plan:  1. DVT Prophylaxis/Anticoagulation: Pharmaceutical: Lovenox  2. Pain Management: Was on started on NSAIDS PTA due to left foot pain. Will monitor for symptoms with increase in activity levels. Continue prn tylenol for now.  3. Mood: patient with poor awareness of deficits. Will monitor for now. LCSW to follow for formal evaluation. Feels down with poor appetite will trial lexapro 4. Neuropsych: This patient is not capable of making decisions on his/her own behalf.  5. Urinary retention no obvious meds check UA and trial flomax 6. ZOX:[id number]. Off antihypertensives at current time. Will increase hctz to 25mg . Check bmet monday 7. Focal motor seizures: continue keppra 1250 mg bid.  8. Insomnia/Hallucinations:  scheduled trazodone at bedtime may contribute to retention will D/C.  9. Symptomatic R-ICA stenosis: continue ASA and plavix. Neurology indicates for surgery past discharge? 10.  L Hemiplegic  shoulder pain support, positioning cont OT, kinesiotape start meds if worsening-feeling better  LOS (Days) 14 A FACE TO FACE EVALUATION WAS PERFORMED  Conchita Truxillo E 04/06/2012, 7:32 AM

## 2012-04-06 NOTE — Progress Notes (Signed)
Physical Therapy Session Note  Patient Details  Name: Shaun Pugh MRN: [id number] Date of Birth: May 10, 1951  Today's Date: 04/06/2012 Time: 1135-1205 Time Calculation (min): 30 min  Short Term Goals: Week 3:  PT Short Term Goal 1 (Week 3): Patient will move supine to and from sit with minimal steadying assist and verbal cueing PT Short Term Goal 2 (Week 3): Patient will transfer to right and left with minimal assistance. PT Short Term Goal 3 (Week 3): Patient will propel wheelchair 150 feet with supervision and minimal verbal cueing PT Short Term Goal 4 (Week 3): Patient will stand 5 minutes with +1 max assist with UE support PT Short Term Goal 5 (Week 3): Patient will ambulate 25 feet with +1 max assist and least restrictive assistive device.  Skilled Therapeutic Interventions/Progress Updates:     Recliner> w/c stand pivot to L with max assist for controlled lowering, steering.  W/c mobility x 50' with min assist > supervision for steering, attention to obstacles on L.  neuromuscular re-education via demo, manual , tactile and VCS for: - scooting forward in w/c, incorporating L side -Sit> stand in parallel bars -wt shifting in standing, x 1 minute, focusing on L stance control; pt c/o dizziness -gait in parallel bars x 8' x 2, max assist to extend L hip and knee; p; t is impulsive and steps with R foot before attempting to extend L  Returned to room, safety belt placed, call bell within reach.  Therapist spoke to RN to inform her of BPs sitting and standing.  Pt's affect slightly brighter today, and he appeared more alert. Therapy Documentation Precautions:  Precautions Precautions: Fall Precaution Comments: Left sided weakness, right gaze preference, left inattention/neglect, left shoulder subluxation, Pusher tendencies  Restrictions Weight Bearing Restrictions: No   Vital Signs: Therapy Vitals Pulse Rate: 95  BP: 130/90 mmHg Patient Position, if appropriate: sitting    BP in standing after 1 minute of wt shifting: 112/80, HR 95      See FIM for current functional status  Therapy/Group: Individual Therapy  Rohen Kimes 04/06/2012, 12:25 PM

## 2012-04-06 NOTE — Progress Notes (Signed)
Speech Language Pathology Daily Session Note  Patient Details  Name: Shaun Pugh MRN: [id number] Date of Birth: [birth year]  Today's Date: 04/06/2012 Time: 4098-1191 Time Calculation (min): 65 min  Short Term Goals: Week 2: SLP Short Term Goal 1 (Week 2): Patient will utilize small bites/sips and slow rate while consuming regular textures and thin liquids with min A multimodal cuing. SLP Short Term Goal 2 (Week 2): Patient will solve basic problems during functional tasks with mod A visual and verbal cueing.  SLP Short Term Goal 3 (Week 2): Patient will attend to the left during basic functional tasks with mod A verbal cuing. SLP Short Term Goal 4 (Week 2): Patient will demonstrate emergent awareness by asking for help when needed with max A verbal cueing.  SLP Short Term Goal 5 (Week 2): Patient will alternate attention between the clinician and a basic functional task with mod A verbal and visual cues for redirection.  SLP Short Term Goal 6 (Week 2): Patient will utilize external aids to facilitate recall of new, daily information with max A multimodal cueing.  Skilled Therapeutic Interventions: Treatment targeted cognition.  SLP facilitated session with supervision question cues for utilization of lingual sweep to clear left lateral sulci pocketing during a self-feeding task with regular textures and thin liquids.  Patient exhibited no overt s/s of aspiration throughout session.   Patient sustained attention to a basic functional sorting task for periods of approximately 5 minutes with min A faded to supervision cuing to redirect.  Patient continues to exhibit decreased initiation and requires overall mod A multimodal cuing during basic functional tasks.   Patient paid attention to left environment with overall mod-min A visual and verbal cuing.     FIM:  Comprehension Comprehension Mode: Auditory Comprehension: 5-Understands basic 90% of the time/requires cueing < 10% of the  time Expression Expression Mode: Verbal Expression: 5-Expresses basic 90% of the time/requires cueing < 10% of the time. Social Interaction Social Interaction: 4-Interacts appropriately 75 - 89% of the time - Needs redirection for appropriate language or to initiate interaction. Problem Solving Problem Solving: 4-Solves basic 75 - 89% of the time/requires cueing 10 - 24% of the time Memory Memory: 4-Recognizes or recalls 75 - 89% of the time/requires cueing 10 - 24% of the time FIM - Eating Eating Activity: 5: Supervision/cues  Pain Pain Assessment Pain Assessment: No/denies pain  Therapy/Group: Individual Therapy  Jackalyn Lombard, Conrad Bassett  Graduate Clinician Speech Language Pathology  Leeyah Heather, Joni Reining 04/06/2012, 11:14 AM

## 2012-04-06 NOTE — Progress Notes (Signed)
The skilled treatment note has been reviewed and SLP is in agreement. Matina Rodier, M.A., CCC-SLP [phone]  

## 2012-04-06 NOTE — Progress Notes (Signed)
Speech Language Pathology Weekly Progress Note  Patient Details  Name: Shaun Pugh MRN: [id number] Date of Birth: 1950/08/20  Today's Date: 04/06/2012  Short Term Goals: Week 2: SLP Short Term Goal 1 (Week 2): Patient will utilize small bites/sips and slow rate while consuming regular textures and thin liquids with min A multimodal cuing. SLP Short Term Goal 1 - Progress (Week 2): Met SLP Short Term Goal 2 (Week 2): Patient will solve basic problems during functional tasks with mod A visual and verbal cueing.  SLP Short Term Goal 2 - Progress (Week 2): Progressing toward goal SLP Short Term Goal 3 (Week 2): Patient will attend to the left during basic functional tasks with mod A verbal cuing. SLP Short Term Goal 3 - Progress (Week 2): Progressing toward goal SLP Short Term Goal 4 (Week 2): Patient will demonstrate emergent awareness by asking for help when needed with max A verbal cueing.  SLP Short Term Goal 4 - Progress (Week 2): Progressing toward goal SLP Short Term Goal 5 (Week 2): Patient will alternate attention between the clinician and a basic functional task with mod A verbal and visual cues for redirection.  SLP Short Term Goal 5 - Progress (Week 2): Progressing toward goal SLP Short Term Goal 6 (Week 2): Patient will utilize external aids to facilitate recall of new, daily information with max A multimodal cueing. SLP Short Term Goal 6 - Progress (Week 2): Progressing toward goal Week 3: SLP Short Term Goal 1 (Week 3): Patient will utilize lingual sweep consuming regular textures and thin liquids with modified independence. SLP Short Term Goal 2 (Week 3): Patient will solve basic problems during functional tasks with mod A visual and verbal cueing. SLP Short Term Goal 3 (Week 3): Patient will attend to the left during basic functional tasks with mod A verbal cuing.  SLP Short Term Goal 4 (Week 3): Patient will demonstrate emergent awareness by asking for help when needed with max  A verbal cueing.  SLP Short Term Goal 5 (Week 3): Patient will alternate attention between the clinician and a basic functional task with mod A verbal and visual cues for redirection. SLP Short Term Goal 6 (Week 3): Patient will utilize external aids to facilitate recall of new, daily information with max A multimodal cueing.   Weekly Progress Updates: Patient met 1 out of 6 short term goals this reporting period with functional gains in dysphagia management. Patient is tolerating regular textures and thin liquids with overall supervision level verbal cuing for utilization of lingual sweep to clear left sided buccal pocketing. While patient has not met his other short term goals or made functional changes he has demonstrated improvements and requires less cuing to perform tasks.  Overall, patient continues to present with decreased sustained attention and initiation during therapy sessions which impacts his ability to complete basic functional, familiar tasks such as self feeding and wheel chair propulsion. Patient currently demonstrates sustained attention to a basic familiar task in a controlled environment for periods of 5 minutes with mod assist multimodal cues for initiation and redirection. Patient would continue to benefit from skilled SLP services for cognition and dysphagia management during CIR stay to maximize functional independence and reduce burden of care upon discharge.   SLP Frequency: 1-2 X/day, 30-60 minutes;5 out of 7 days Estimated Length of Stay: anticipated discharge 12/11/3 SLP Treatment/Interventions: Cognitive remediation/compensation;Cueing hierarchy;Dysphagia/aspiration precaution training;Environmental controls;Functional tasks;Internal/external aids;Patient/family education;Speech/Language facilitation;Therapeutic Activities  Charlane Ferretti., CCC-SLP [phone]  Shaun Pugh 04/06/2012, 4:43 PM

## 2012-04-06 NOTE — Progress Notes (Signed)
Patient unable to void.  Bladder scan results are 450 ml.  In and out cath results are 500 ml.  Patient tolerated well,

## 2012-04-06 NOTE — Progress Notes (Signed)
Occupational Therapy Session Note  Patient Details  Name: Shaun Pugh MRN: [id number] Date of Birth: 19-Jul-1950  Today's Date: 04/06/2012 Time: 0930-1030  Time Calculation (min): 60 min   Short Term Goals: Week 2:  OT Short Term Goal 1 (Week 2): Patient will perform dressing with Max Assist. OT Short Term Goal 2 (Week 2): Patient will increase sitting balance from Poor+ to Fair- to increase functional performance during bathing and dressing. OT Short Term Goal 3 (Week 2): Patient will increase toilet transfer to Max Assist. OT Short Term Goal 4 (Week 2): Patient will initiate dressing and bathing tasks without vc's.  Skilled Therapeutic Interventions/Progress Updates:   Session 1: ADL re-training completed in shower this AM with rolling shower chair. Session with focus on ADL performance, functional transfers, left awareness, sitting and standing balance. Patient was in w/c upon therapy arrival. Patient stood in bathroom at bar and performed a stand pivot transfer to rolling shower chair. At end of shower, patient stood at bathroom bar and performed a stand pivot transfer to w/c. Patient did show slightly more motivation to participate in dressing this session as he only required Min vc's for encouragement. Patient donned right pant leg in to sweatpants and helped pull pants over right hip. Patient did need Mod vc's and physical assistance to donn long sleeved shirt this session. Patient only required mod vc's to attend to left side during bathing and dressing. Patient did not become distracted during ADL session as seen in previous sessions.       Therapy Documentation Precautions:  Precautions Precautions: Fall Precaution Comments: Left sided weakness, right gaze preference, left inattention/neglect, left shoulder subluxation, Pusher tendencies  Restrictions Weight Bearing Restrictions: No  Pain: Pain Assessment Pain Assessment: No/denies pain  See FIM for current functional  status  Therapy/Group: Individual Therapy session 1   Limmie Patricia, OTR/L 04/06/2012, 11:21 AM

## 2012-04-06 NOTE — Progress Notes (Signed)
Physical Therapy Note  Patient Details  Name: Shaun Pugh MRN: [id number] Date of Birth: [birth year] Today's Date: 04/06/2012  1300-1340 (40 minutes) individual Pain: no reported pain Focus of treatment: gait training Treatment: Gait 15 feet x 4 with rail right mod/max assist with second person to follow with wc for safety.  Marland Kitchen Pt partially advances and adducts LT LE during swing with max tactile cues for weight shift to right prior to swing . Pt also prematurely advances trunk with increased step length on right.     Denson Niccoli,JIM 04/06/2012, 8:02 AM

## 2012-04-07 DIAGNOSIS — G8110 Spastic hemiplegia affecting unspecified side: Secondary | ICD-10-CM

## 2012-04-07 DIAGNOSIS — I6340 Cerebral infarction due to embolism of unspecified cerebral artery: Secondary | ICD-10-CM

## 2012-04-07 DIAGNOSIS — I69922 Dysarthria following unspecified cerebrovascular disease: Secondary | ICD-10-CM

## 2012-04-07 MED ORDER — CIPROFLOXACIN HCL 250 MG PO TABS
250.0000 mg | ORAL_TABLET | Freq: Two times a day (BID) | ORAL | Status: DC
  Administered 2012-04-07 – 2012-04-15 (×17): 250 mg via ORAL
  Filled 2012-04-07 (×19): qty 1

## 2012-04-07 NOTE — Progress Notes (Signed)
Patient ID: Shaun Pugh, male   DOB: 1950-11-02, 61 y.o.   MRN: [id number] Shaun Pugh is a 61 y.o. RH-male with history of CAD, TIAs with left visual field deficits as well as dizziness/HA. Admitted on 03/17/12 with HA, left sided weakness and numbness with slurred speech. CT head negative and treated with tPA. CTA head/neck with hypodensity right superior gyrus, ulcerated plaque right ICA with 60% ICA stenosis, suspicion for 10mm focal thrombus proximal aortic arch. Patient developed combativeness with agitation as well as focal motor seizures LUE on 03/18/12. He was treated with ativan and started on Keppra. F/u CCT with extensive area of low density affecting cortical and subcortical right hemisphere in MCA distribution. 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 Placed on ASA and no anticoagulation due to large stroke and risk of bleeding. EEG without epileptiform activity. He did develop fevers and was started on antibiotics for LLL infiltrate. Carotid dopplers done revealing 60-79% ICA stenosis. 2D echo with EF 65-70%   Subjective/Complaints: Required I/O cath again last night, 400cc, 500cc prior Review of Systems  Constitutional: Positive for malaise/fatigue.  Respiratory: Negative for cough.   Cardiovascular: Negative for chest pain.  Neurological: Positive for sensory change, speech change and focal weakness. Negative for seizures.  All other systems reviewed and are negative.    Objective: Vital Signs: Blood pressure 156/99, pulse 97, temperature 97.8 F (36.6 C), temperature source Oral, resp. rate 18, weight 101.7 kg (224 lb 3.3 oz), SpO2 97.00%. No results found. Results for orders placed during the hospital encounter of 03/23/12 (from the past 72 hour(s))  BASIC METABOLIC PANEL     Status: Abnormal   Collection Time   04/04/12  8:28 AM      Component Value Range Comment   Sodium 133 (*) 135 - 145 mEq/L    Potassium  4.1  3.5 - 5.1 mEq/L    Chloride 94 (*) 96 - 112 mEq/L    CO2 22  19 - 32 mEq/L    Glucose, Bld 144 (*) 70 - 99 mg/dL    BUN 25 (*) 6 - 23 mg/dL    Creatinine, Ser 4.09  0.50 - 1.35 mg/dL    Calcium 81.1  8.4 - 10.5 mg/dL    GFR calc non Af Amer 62 (*) >90 mL/min    GFR calc Af Amer 72 (*) >90 mL/min   CREATININE, SERUM     Status: Abnormal   Collection Time   04/06/12  6:03 AM      Component Value Range Comment   Creatinine, Ser 1.22  0.50 - 1.35 mg/dL    GFR calc non Af Amer 62 (*) >90 mL/min    GFR calc Af Amer 72 (*) >90 mL/min   URINALYSIS, ROUTINE W REFLEX MICROSCOPIC     Status: Abnormal   Collection Time   04/06/12  3:36 PM      Component Value Range Comment   Color, Urine YELLOW  YELLOW    APPearance HAZY (*) CLEAR    Specific Gravity, Urine 1.024  1.005 - 1.030    pH 5.5  5.0 - 8.0    Glucose, UA NEGATIVE  NEGATIVE mg/dL    Hgb urine dipstick LARGE (*) NEGATIVE    Bilirubin Urine NEGATIVE  NEGATIVE    Ketones, ur NEGATIVE  NEGATIVE mg/dL    Protein, ur NEGATIVE  NEGATIVE mg/dL    Urobilinogen, UA 1.0  0.0 - 1.0 mg/dL  Nitrite POSITIVE (*) NEGATIVE    Leukocytes, UA TRACE (*) NEGATIVE   URINE MICROSCOPIC-ADD ON     Status: Abnormal   Collection Time   04/06/12  3:36 PM      Component Value Range Comment   WBC, UA 3-6  <3 WBC/hpf    RBC / HPF 11-20  <3 RBC/hpf    Bacteria, UA MANY (*) RARE      Nursing note and vitals reviewed.  Constitutional: He appears well-developed and well-nourished.  HENT:  Head: Normocephalic and atraumatic.  Eyes: Pupils are equal, round, and reactive to light.  Neck: Normal range of motion. Neck supple.  Cardiovascular: Normal rate and regular rhythm.  Pulmonary/Chest: Effort normal and breath sounds normal.  Abdominal: Soft. Bowel sounds are normal.  Musculoskeletal: He exhibits no edema.  Neurological: He is alert.  Oriented to self and situation, place, reason... Able to follow basic commands. Dense left hemiparesis.0/5 LUE  except 1/5 L triceps  2-/5 L HE/KE and  ankle PF , R side 5/5. Mild left inattention. Skin: Skin is warm and dry.  1/2 FB sublux on L,    Assessment/Plan: 1. Functional deficits secondary to Large R MCA infarct with L HP, L hemisensory def, cognitive deficits which require 3+ hours per day of interdisciplinary therapy in a comprehensive inpatient rehab setting. Physiatrist is providing close team supervision and 24 hour management of active medical problems listed below. Physiatrist and rehab team continue to assess barriers to discharge/monitor patient progress toward functional and medical goals. FIM: FIM - Bathing Bathing Steps Patient Completed: Chest;Right Arm;Left Arm;Abdomen;Front perineal area;Buttocks;Right upper leg;Left upper leg;Right lower leg (including foot);Left lower leg (including foot) Bathing: 3: Mod-Patient completes 5-7 70f 10 parts or 50-74%  FIM - Upper Body Dressing/Undressing Upper body dressing/undressing steps patient completed: Thread/unthread right sleeve of pullover shirt/dresss;Thread/unthread left sleeve of pullover shirt/dress;Put head through opening of pull over shirt/dress;Pull shirt over trunk Upper body dressing/undressing: 2: Max-Patient completed 25-49% of tasks FIM - Lower Body Dressing/Undressing Lower body dressing/undressing steps patient completed: Thread/unthread right pants leg;Thread/unthread left pants leg;Pull pants up/down;Don/Doff right sock;Don/Doff left sock;Don/Doff right shoe;Don/Doff left shoe;Fasten/unfasten right shoe;Fasten/unfasten left shoe Lower body dressing/undressing: 2: Max-Patient completed 25-49% of tasks  FIM - Toileting Toileting steps completed by patient: Adjust clothing prior to toileting;Performs perineal hygiene;Adjust clothing after toileting Toileting: 0: Activity did not occur  FIM - Diplomatic Services operational officer Devices: Grab bars Toilet Transfers: 2-To toilet/BSC: Max A (lift and lower  assist);2-From toilet/BSC: Max A (lift and lower assist)  FIM - Press photographer Assistive Devices: Arm rests Bed/Chair Transfer: 4: Bed > Chair or W/C: Min A (steadying Pt. > 75%)  FIM - Locomotion: Wheelchair Distance: 120 Locomotion: Wheelchair: 2: Travels 50 - 149 ft with minimal assistance (Pt.>75%) FIM - Locomotion: Ambulation Locomotion: Ambulation Assistive Devices: Parallel bars Ambulation/Gait Assistance: 2: Max assist Locomotion: Ambulation: 1: Travels less than 50 ft with maximal assistance (Pt: 25 - 49%)  Comprehension Comprehension Mode: Auditory Comprehension: 5-Understands complex 90% of the time/Cues < 10% of the time  Expression Expression Mode: Verbal Expression: 5-Expresses complex 90% of the time/cues < 10% of the time  Social Interaction Social Interaction: 4-Interacts appropriately 75 - 89% of the time - Needs redirection for appropriate language or to initiate interaction.  Problem Solving Problem Solving: 4-Solves basic 75 - 89% of the time/requires cueing 10 - 24% of the time  Memory Memory: 4-Recognizes or recalls 75 - 89% of the time/requires cueing 10 - 24%  of the time   Medical Problem List and Plan:  1. DVT Prophylaxis/Anticoagulation: Pharmaceutical: Lovenox  2. Pain Management: Was on started on NSAIDS PTA due to left foot pain. Will monitor for symptoms with increase in activity levels. Continue prn tylenol for now.  3. Mood: patient with poor awareness of deficits. Will monitor for now. LCSW to follow for formal evaluation. Feels down with poor appetite will trial lexapro 4. Neuropsych: This patient is not capable of making decisions on his/her own behalf.  5. Urinary retention: flomax, begin empiric cipro for positive UA. 6. WUJ:[id number]. Off antihypertensives at current time. Will increase hctz to 25mg .   7. Focal motor seizures: continue keppra 1250 mg bid.  8. Insomnia/Hallucinations:  scheduled trazodone at  bedtime may contribute to retention will D/C.  9. Symptomatic R-ICA stenosis: continue ASA and plavix. Neurology indicates for surgery past discharge? 10.  L Hemiplegic shoulder pain support, positioning cont OT, kinesiotape start meds if worsening-feeling better  LOS (Days) 15 A FACE TO FACE EVALUATION WAS PERFORMED  Joany Khatib T 04/07/2012, 6:11 AM

## 2012-04-08 ENCOUNTER — Inpatient Hospital Stay (HOSPITAL_COMMUNITY): Payer: Medicare Other

## 2012-04-08 ENCOUNTER — Inpatient Hospital Stay (HOSPITAL_COMMUNITY): Payer: Medicare Other | Admitting: Occupational Therapy

## 2012-04-08 ENCOUNTER — Inpatient Hospital Stay (HOSPITAL_COMMUNITY): Payer: Medicare Other | Admitting: Physical Therapy

## 2012-04-08 ENCOUNTER — Inpatient Hospital Stay (HOSPITAL_COMMUNITY): Payer: Medicare Other | Admitting: Speech Pathology

## 2012-04-08 LAB — URINE CULTURE: Colony Count: >=100000

## 2012-04-08 NOTE — Progress Notes (Signed)
Social Work Patient ID: Shaun Pugh, male   DOB: [birth year], 61 y.o.   MRN: [id number] Met with pt and wife to inform team conference progression toward goals and wife needing to begin family education. Aware discharge 12/11 and will work toward this date.  Wife has been here but has not done any hands on therapy. She will begin this next week and will have children come also.  Continue to work on discharge plans.

## 2012-04-08 NOTE — Progress Notes (Signed)
Physical Therapy Session Note  Patient Details  Name: Shaun Pugh MRN: [id number] Date of Birth: 12/08/1950  Today's Date: 04/08/2012 Time: 1535-1600 Time Calculation (min): 25 min  Short Term Goals: Week 2:  PT Short Term Goal 1 (Week 2): Pt will move sit> supine with min assist. PT Short Term Goal 1 - Progress (Week 2): Met PT Short Term Goal 2 (Week 2): Pt will transfer bed/recliner >< w/c with mod assist. PT Short Term Goal 2 - Progress (Week 2): Met PT Short Term Goal 3 (Week 2): Pt will propel w/c x 150' with min assist. PT Short Term Goal 3 - Progress (Week 2): Met PT Short Term Goal 4 (Week 2): Pt will stand x 5 minutes at high table, max assist of 1 person. PT Short Term Goal 4 - Progress (Week 2): Progressing toward goal PT Short Term Goal 5 (Week 2): Pt will perform gait x 30' with assist of 1-2 people PT Short Term Goal 5 - Progress (Week 2): Met Week 3:  PT Short Term Goal 1 (Week 3): Patient will move supine to and from sit with minimal steadying assist and verbal cueing PT Short Term Goal 2 (Week 3): Patient will transfer to right and left with minimal assistance. PT Short Term Goal 3 (Week 3): Patient will propel wheelchair 150 feet with supervision and minimal verbal cueing PT Short Term Goal 4 (Week 3): Patient will stand 5 minutes with +1 max assist with UE support PT Short Term Goal 5 (Week 3): Patient will ambulate 25 feet with +1 max assist and least restrictive assistive device.  Skilled Therapeutic Interventions/Progress Updates:   Patient resting in recliner; continues to require mod-max encouragement to initiate and participate secondary to fatigue.  Performed transfers recliner > w/c to L with mod A stand pivot with verbal and visual cues for LUE placement, anterior lean and for initiation and sequencing and to attend to L foot placement.  Performed transfer w/c > recliner to R at end of session with min A squat pivot with improved sequencing and initiation.   Patient performed w/c mobility in controlled environment x 50' with R hemi technique with mod A overall for attention to L environment and verbal and tactile cues to sequence propulsion for obstacle negotiation and to maintain straight navigation.    NMR standing in hallway with RUE support on wall rail during gait x 25' forwards + 25' retro stepping with verbal, tactile cues for R lateral weight shift, to maintain upright trunk posture in order to advance LLE and verbal and tactile cues for full forward weight shift onto LLE and activation of LLE extensors in stance.  Performed training for weight shifting and activation of LLE hip and knee flexors for swing phase standing with UE support on wall rail during R lateral weight shift and performing 5-6 reps LLE foot taps to 4" step with assistance to prevent use of R adductor muscles to compensate.  Patient required two rest breaks secondary to fatigue.     Therapy Documentation Precautions:  Precautions Precautions: Fall Precaution Comments: Left sided weakness, right gaze preference, left inattention/neglect, left shoulder subluxation, Pusher tendencies  Restrictions Weight Bearing Restrictions: No Vital Signs: Therapy Vitals Temp: 98.5 F (36.9 C) Temp src: Oral Pulse Rate: 82  Resp: 18  BP: 143/81 mmHg Patient Position, if appropriate: Lying Oxygen Therapy SpO2: 99 % O2 Device: None (Room air) Pain: Pain Assessment Pain Assessment: No/denies pain  See FIM for current functional status  Therapy/Group: Individual  Therapy  [facility] 04/08/2012, 5:14 PM

## 2012-04-08 NOTE — Progress Notes (Signed)
Physical Therapy Session Note  Patient Details  Name: Shaun Pugh MRN: [id number] Date of Birth: 06-08-1950  Today's Date: 04/08/2012 Time: 1030-1130 Time Calculation (min): 60 min  Short Term Goals: Week 2:  PT Short Term Goal 1 (Week 2): Pt will move sit> supine with min assist. PT Short Term Goal 1 - Progress (Week 2): Met PT Short Term Goal 2 (Week 2): Pt will transfer bed/recliner >< w/c with mod assist. PT Short Term Goal 2 - Progress (Week 2): Met PT Short Term Goal 3 (Week 2): Pt will propel w/c x 150' with min assist. PT Short Term Goal 3 - Progress (Week 2): Met PT Short Term Goal 4 (Week 2): Pt will stand x 5 minutes at high table, max assist of 1 person. PT Short Term Goal 4 - Progress (Week 2): Progressing toward goal PT Short Term Goal 5 (Week 2): Pt will perform gait x 30' with assist of 1-2 people PT Short Term Goal 5 - Progress (Week 2): Met Week 3:  PT Short Term Goal 1 (Week 3): Patient will move supine to and from sit with minimal steadying assist and verbal cueing PT Short Term Goal 2 (Week 3): Patient will transfer to right and left with minimal assistance. PT Short Term Goal 3 (Week 3): Patient will propel wheelchair 150 feet with supervision and minimal verbal cueing PT Short Term Goal 4 (Week 3): Patient will stand 5 minutes with +1 max assist with UE support PT Short Term Goal 5 (Week 3): Patient will ambulate 25 feet with +1 max assist and least restrictive assistive device.  Skilled Therapeutic Interventions/Progress Updates:  Patient transferred wheelchair to mat with min steady assist. Patient sit to supine with min assist to lift left LE. Treatment consisted of facilitation and active assistive exercise using the suspension grid for hip abduction/adduction, hip flexion/extension, knee flexion/extension, and ankle dorsiflexion. Patient has active movement with each of these. Patient supine to sit with verbal cueing and min assist. Patient worked on sit to  stand working on IT consultant and large based quad cane. Attempted use of [employer] balance system to give patient feedback on center of gravity. Patient said he did not like it and requested to get off. Patient ambulated in hallway using the hand rail on the right with moderate to maximal assistance 30 feet. Patient progressed left LE independently every step. Patient required assistance for balance, weight shifting, as he pushes heavily to left. Patient propelled self to room via wheelchair with min assist and transferred to recliner with min assist.   Therapy Documentation Precautions:  Precautions Precautions: Fall Precaution Comments: Left sided weakness, right gaze preference, left inattention/neglect, left shoulder subluxation, Pusher tendencies  Restrictions Weight Bearing Restrictions: No  Pain: Pain Assessment Pain Assessment: No/denies pain  Locomotion : Ambulation Ambulation/Gait Assistance: 2: Max assist   See FIM for current functional status  Therapy/Group: Individual Therapy  Arelia Longest M 04/08/2012, 11:48 AM

## 2012-04-08 NOTE — Progress Notes (Signed)
Patient ID: Shaun Pugh, male   DOB: 10/02/1950, 61 y.o.   MRN: [id number] Shaun Pugh is a 60 y.o. RH-male with history of CAD, TIAs with left visual field deficits as well as dizziness/HA. Admitted on 03/17/12 with HA, left sided weakness and numbness with slurred speech. CT head negative and treated with tPA. CTA head/neck with hypodensity right superior gyrus, ulcerated plaque right ICA with 60% ICA stenosis, suspicion for 10mm focal thrombus proximal aortic arch. Patient developed combativeness with agitation as well as focal motor seizures LUE on 03/18/12. He was treated with ativan and started on Keppra. F/u CCT with extensive area of low density affecting cortical and subcortical right hemisphere in MCA distribution. 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 Placed on ASA and no anticoagulation due to large stroke and risk of bleeding. EEG without epileptiform activity. He did develop fevers and was started on antibiotics for LLL infiltrate. Carotid dopplers done revealing 60-79% ICA stenosis. 2D echo with EF 65-70%   Subjective/Complaints: No I/O cath yesterday Discussed ritalin trial  Review of Systems  Constitutional: Positive for malaise/fatigue.  Respiratory: Negative for cough.   Cardiovascular: Negative for chest pain.  Neurological: Positive for sensory change, speech change and focal weakness. Negative for seizures.  All other systems reviewed and are negative.    Objective: Vital Signs: Blood pressure 142/68, pulse 76, temperature 97.6 F (36.4 C), temperature source Oral, resp. rate 20, weight 101.7 kg (224 lb 3.3 oz), SpO2 97.00%. No results found. Results for orders placed during the hospital encounter of 03/23/12 (from the past 72 hour(s))  CREATININE, SERUM     Status: Abnormal   Collection Time   04/06/12  6:03 AM      Component Value Range Comment   Creatinine, Ser 1.22  0.50 - 1.35 mg/dL    GFR calc non  Af Amer 62 (*) >90 mL/min    GFR calc Af Amer 72 (*) >90 mL/min   URINALYSIS, ROUTINE W REFLEX MICROSCOPIC     Status: Abnormal   Collection Time   04/06/12  3:36 PM      Component Value Range Comment   Color, Urine YELLOW  YELLOW    APPearance HAZY (*) CLEAR    Specific Gravity, Urine 1.024  1.005 - 1.030    pH 5.5  5.0 - 8.0    Glucose, UA NEGATIVE  NEGATIVE mg/dL    Hgb urine dipstick LARGE (*) NEGATIVE    Bilirubin Urine NEGATIVE  NEGATIVE    Ketones, ur NEGATIVE  NEGATIVE mg/dL    Protein, ur NEGATIVE  NEGATIVE mg/dL    Urobilinogen, UA 1.0  0.0 - 1.0 mg/dL    Nitrite POSITIVE (*) NEGATIVE    Leukocytes, UA TRACE (*) NEGATIVE   URINE CULTURE     Status: Normal (Preliminary result)   Collection Time   04/06/12  3:36 PM      Component Value Range Comment   Specimen Description URINE, CATHETERIZED      Special Requests NONE      Culture  Setup Time 04/06/2012 15:55      Colony Count >=100,000 COLONIES/ML      Culture ESCHERICHIA COLI      Report Status PENDING     URINE MICROSCOPIC-ADD ON     Status: Abnormal   Collection Time   04/06/12  3:36 PM      Component Value Range Comment   WBC, UA 3-6  <3 WBC/hpf  RBC / HPF 11-20  <3 RBC/hpf    Bacteria, UA MANY (*) RARE      Nursing note and vitals reviewed.  Constitutional: He appears well-developed and well-nourished.  HENT:  Head: Normocephalic and atraumatic.  Eyes: Pupils are equal, round, and reactive to light.  Neck: Normal range of motion. Neck supple.  Cardiovascular: Normal rate and regular rhythm.  Pulmonary/Chest: Effort normal and breath sounds normal.  Abdominal: Soft. Bowel sounds are normal.  Musculoskeletal: He exhibits no edema.  Neurological: He is alert.  Oriented to self and situation, place, reason... Able to follow basic commands. Dense left hemiparesis.0/5 LUE except 1/5 L triceps  2-/5 L HE/KE and  ankle PF , R side 5/5. Mild left inattention. Skin: Skin is warm and dry.  1/2 FB sublux on L,     Assessment/Plan: 1. Functional deficits secondary to Large R MCA infarct with L HP, L hemisensory def, cognitive deficits which require 3+ hours per day of interdisciplinary therapy in a comprehensive inpatient rehab setting. Physiatrist is providing close team supervision and 24 hour management of active medical problems listed below. Physiatrist and rehab team continue to assess barriers to discharge/monitor patient progress toward functional and medical goals. FIM: FIM - Bathing Bathing Steps Patient Completed: Chest;Right Arm;Left Arm;Abdomen;Front perineal area;Buttocks;Right upper leg;Left upper leg;Right lower leg (including foot);Left lower leg (including foot) Bathing: 3: Mod-Patient completes 5-7 58f 10 parts or 50-74%  FIM - Upper Body Dressing/Undressing Upper body dressing/undressing steps patient completed: Thread/unthread right sleeve of pullover shirt/dresss;Thread/unthread left sleeve of pullover shirt/dress;Put head through opening of pull over shirt/dress;Pull shirt over trunk Upper body dressing/undressing: 2: Max-Patient completed 25-49% of tasks FIM - Lower Body Dressing/Undressing Lower body dressing/undressing steps patient completed: Thread/unthread right pants leg;Thread/unthread left pants leg;Pull pants up/down;Don/Doff right sock;Don/Doff left sock;Don/Doff right shoe;Don/Doff left shoe;Fasten/unfasten right shoe;Fasten/unfasten left shoe Lower body dressing/undressing: 2: Max-Patient completed 25-49% of tasks  FIM - Toileting Toileting steps completed by patient: Adjust clothing prior to toileting;Performs perineal hygiene;Adjust clothing after toileting Toileting: 0: Activity did not occur  FIM - Diplomatic Services operational officer Devices: Grab bars Toilet Transfers: 2-To toilet/BSC: Max A (lift and lower assist);2-From toilet/BSC: Max A (lift and lower assist)  FIM - Press photographer Assistive Devices: Arm rests Bed/Chair  Transfer: 4: Bed > Chair or W/C: Min A (steadying Pt. > 75%)  FIM - Locomotion: Wheelchair Distance: 120 Locomotion: Wheelchair: 2: Travels 50 - 149 ft with minimal assistance (Pt.>75%) FIM - Locomotion: Ambulation Locomotion: Ambulation Assistive Devices: Parallel bars Ambulation/Gait Assistance: 2: Max assist Locomotion: Ambulation: 1: Travels less than 50 ft with maximal assistance (Pt: 25 - 49%)  Comprehension Comprehension Mode: Auditory Comprehension: 5-Understands complex 90% of the time/Cues < 10% of the time  Expression Expression Mode: Verbal Expression: 2-Expresses basic 25 - 49% of the time/requires cueing 50 - 75% of the time. Uses single words/gestures.  Social Interaction Social Interaction: 4-Interacts appropriately 75 - 89% of the time - Needs redirection for appropriate language or to initiate interaction.  Problem Solving Problem Solving: 3-Solves basic 50 - 74% of the time/requires cueing 25 - 49% of the time  Memory Memory: 3-Recognizes or recalls 50 - 74% of the time/requires cueing 25 - 49% of the time   Medical Problem List and Plan:  1. DVT Prophylaxis/Anticoagulation: Pharmaceutical: Lovenox  2. Pain Management: Was on started on NSAIDS PTA due to left foot pain. Will monitor for symptoms with increase in activity levels. Continue prn tylenol for now.  3. Mood: patient with poor awareness of deficits. Will monitor for now. LCSW to follow for formal evaluation. Feels down with poor appetite will trial lexapro, added ritalin for activation 4. Neuropsych: This patient is not capable of making decisions on his/her own behalf.  5. Urinary retention: flomax, Improving on Cipro for e coli 6. ZOX:[id number]. Off antihypertensives at current time. Will increase hctz to 25mg .   7. Focal motor seizures: continue keppra 1250 mg bid.  8. Insomnia/Hallucinations:  scheduled trazodone at bedtime may contribute to retention will D/C.  9. Symptomatic R-ICA stenosis:  continue ASA and plavix. Neurology indicates for surgery past discharge? 10.  L Hemiplegic shoulder pain support, positioning cont OT, kinesiotape start meds if worsening-feeling better  LOS (Days) 16 A FACE TO FACE EVALUATION WAS PERFORMED  KIRSTEINS,ANDREW E 04/08/2012, 7:35 AM

## 2012-04-08 NOTE — Progress Notes (Signed)
Occupational Therapy Session Note  Patient Details  Name: Shaun Pugh MRN: [id number] Date of Birth: 12-Apr-1951  Today's Date: 04/08/2012 Time: 0930-1028 Time Calculation (min): 58 min  Short Term Goals: Week 2:  OT Short Term Goal 1 (Week 2): Patient will perform dressing with Max Assist. OT Short Term Goal 2 (Week 2): Patient will increase sitting balance from Poor+ to Fair- to increase functional performance during bathing and dressing. OT Short Term Goal 3 (Week 2): Patient will increase toilet transfer to Max Assist. OT Short Term Goal 4 (Week 2): Patient will initiate dressing and bathing tasks without vc's.  Skilled Therapeutic Interventions/Progress Updates:    Pt seen for ADL retraining with focus on initiation, sequencing, and carryover of hemi-technique with bathing and dressing at walk-in shower level.  Pt demonstrated sit <> stand at grab bar in bathroom with min assist for steadying, required assistance to doff pants prior to bathing.  Pt completed bathing with min verbal cues, initiating without cues, min to complete task.  Grooming completed in sitting at sink with assist to open packets to soak dentures.    Therapy Documentation Precautions:  Precautions Precautions: Fall Precaution Comments: Left sided weakness, right gaze preference, left inattention/neglect, left shoulder subluxation, Pusher tendencies  Restrictions Weight Bearing Restrictions: No Pain: Pain Assessment Pain Assessment: No/denies pain  See FIM for current functional status  Therapy/Group: Individual Therapy  Leonette Monarch 04/08/2012, 12:20 PM

## 2012-04-08 NOTE — Progress Notes (Signed)
Speech Language Pathology Daily Session Note  Patient Details  Name: Shaun Pugh MRN: [id number] Date of Birth: [birth year]  Today's Date: 04/08/2012 Time: 4098-1191 Time Calculation (min): 43 min  Short Term Goals: Week 3: SLP Short Term Goal 1 (Week 3): Patient will utilize lingual sweep consuming regular textures and thin liquids with modified independence. SLP Short Term Goal 2 (Week 3): Patient will solve basic problems during functional tasks with mod A visual and verbal cueing. SLP Short Term Goal 3 (Week 3): Patient will attend to the left during basic functional tasks with mod A verbal cuing.  SLP Short Term Goal 4 (Week 3): Patient will demonstrate emergent awareness by asking for help when needed with max A verbal cueing.  SLP Short Term Goal 5 (Week 3): Patient will alternate attention between the clinician and a basic functional task with mod A verbal and visual cues for redirection. SLP Short Term Goal 6 (Week 3): Patient will utilize external aids to facilitate recall of new, daily information with max A multimodal cueing.   Skilled Therapeutic Interventions: Skilled treatment session targeted cognition and self-care goals. SLP facilitated session with min increased to mod with fatigue to sustain attention for 10 minutes.  SLP assist patient by providing max assist verbal, visual and tactile cues to cross over midline and attend to left of environment during a structured card task.  SLP also facilitated session with mod assist multimodal cuing during basic functional task to recall and perform procedures with cues to utilize external aids.    FIM:  Comprehension Comprehension Mode: Auditory Comprehension: 4-Understands basic 75 - 89% of the time/requires cueing 10 - 24% of the time Expression Expression Mode: Verbal Expression: 3-Expresses basic 50 - 74% of the time/requires cueing 25 - 50% of the time. Needs to repeat parts of sentences. Social Interaction Social  Interaction: 4-Interacts appropriately 75 - 89% of the time - Needs redirection for appropriate language or to initiate interaction. Problem Solving Problem Solving: 2-Solves basic 25 - 49% of the time - needs direction more than half the time to initiate, plan or complete simple activities Memory Memory: 3-Recognizes or recalls 50 - 74% of the time/requires cueing 25 - 49% of the time  Pain Pain Assessment Pain Assessment: No/denies pain  Therapy/Group: Individual Therapy  Shaun Pugh., CCC-SLP [phone]  Shaun Pugh 04/08/2012, 10:59 AM

## 2012-04-09 NOTE — Progress Notes (Signed)
Patient ID: Shaun Pugh, male   DOB: [birth year], 61 y.o.   MRN: [id number] Shaun Pugh is a 61 y.o. RH-male with history of CAD, TIAs with left visual field deficits as well as dizziness/HA. Admitted on 03/17/12 with HA, left sided weakness and numbness with slurred speech. CT head negative and treated with tPA. CTA head/neck with hypodensity right superior gyrus, ulcerated plaque right ICA with 60% ICA stenosis, suspicion for 10mm focal thrombus proximal aortic arch. Patient developed combativeness with agitation as well as focal motor seizures LUE on 03/18/12. He was treated with ativan and started on Keppra. F/u CCT with extensive area of low density affecting cortical and subcortical right hemisphere in MCA distribution. 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 Placed on ASA and no anticoagulation due to large stroke and risk of bleeding. EEG without epileptiform activity. He did develop fevers and was started on antibiotics for LLL infiltrate. Carotid dopplers done revealing 60-79% ICA stenosis. 2D echo with EF 65-70%   Subjective/Complaints: He had a supp today for constipation No I/O cath yesterday Discussed ritalin trial  Review of Systems  Constitutional: Positive for malaise/fatigue.  Respiratory: Negative for cough.   Cardiovascular: Negative for chest pain.  Neurological: Positive for sensory change, speech change and focal weakness. Negative for seizures.  All other systems reviewed and are negative.    Objective: Vital Signs: Blood pressure 126/82, pulse 75, temperature 97.7 F (36.5 C), temperature source Oral, resp. rate 18, weight 224 lb 3.3 oz (101.7 kg), SpO2 94.00%. No results found. Results for orders placed during the hospital encounter of 03/23/12 (from the past 72 hour(s))  URINALYSIS, ROUTINE W REFLEX MICROSCOPIC     Status: Abnormal   Collection Time   04/06/12  3:36 PM      Component Value Range Comment     Color, Urine YELLOW  YELLOW    APPearance HAZY (*) CLEAR    Specific Gravity, Urine 1.024  1.005 - 1.030    pH 5.5  5.0 - 8.0    Glucose, UA NEGATIVE  NEGATIVE mg/dL    Hgb urine dipstick LARGE (*) NEGATIVE    Bilirubin Urine NEGATIVE  NEGATIVE    Ketones, ur NEGATIVE  NEGATIVE mg/dL    Protein, ur NEGATIVE  NEGATIVE mg/dL    Urobilinogen, UA 1.0  0.0 - 1.0 mg/dL    Nitrite POSITIVE (*) NEGATIVE    Leukocytes, UA TRACE (*) NEGATIVE   URINE CULTURE     Status: Normal   Collection Time   04/06/12  3:36 PM      Component Value Range Comment   Specimen Description URINE, CATHETERIZED      Special Requests NONE      Culture  Setup Time 04/06/2012 15:55      Colony Count >=100,000 COLONIES/ML      Culture ESCHERICHIA COLI      Report Status 04/08/2012 FINAL      Organism ID, Bacteria ESCHERICHIA COLI     URINE MICROSCOPIC-ADD ON     Status: Abnormal   Collection Time   04/06/12  3:36 PM      Component Value Range Comment   WBC, UA 3-6  <3 WBC/hpf    RBC / HPF 11-20  <3 RBC/hpf    Bacteria, UA MANY (*) RARE   GLUCOSE, CAPILLARY     Status: Abnormal   Collection Time   04/09/12  7:21 AM      Component Value Range Comment  Glucose-Capillary 110 (*) 70 - 99 mg/dL      Nursing note and vitals reviewed.  Constitutional: He appears well-developed and well-nourished.  HENT:  Head: Normocephalic and atraumatic.  Eyes: Pupils are equal, round, and reactive to light.  Neck: Normal range of motion. Neck supple.  Cardiovascular: Normal rate and regular rhythm.  Pulmonary/Chest: Effort normal and breath sounds normal.  Abdominal: Soft. Bowel sounds are normal.  Musculoskeletal: He exhibits no edema.  Neurological: He is alert.  Oriented to self and situation, place, reason... Able to follow basic commands. Dense left hemiparesis.0/5 LUE except 1/5 L triceps  2-/5 L HE/KE and  ankle PF , R side 5/5. Mild left inattention. Skin: Skin is warm and dry.  1/2 FB sublux on L,     Assessment/Plan: 1. Functional deficits secondary to Large R MCA infarct with L HP, L hemisensory def, cognitive deficits which require 3+ hours per day of interdisciplinary therapy in a comprehensive inpatient rehab setting. Physiatrist is providing close team supervision and 24 hour management of active medical problems listed below. Physiatrist and rehab team continue to assess barriers to discharge/monitor patient progress toward functional and medical goals. FIM: FIM - Bathing Bathing Steps Patient Completed: Chest;Left Arm;Abdomen;Front perineal area;Right upper leg;Left upper leg Bathing: 3: Mod-Patient completes 5-7 80f 10 parts or 50-74%  FIM - Upper Body Dressing/Undressing Upper body dressing/undressing steps patient completed: Thread/unthread right sleeve of pullover shirt/dresss;Put head through opening of pull over shirt/dress Upper body dressing/undressing: 3: Mod-Patient completed 50-74% of tasks FIM - Lower Body Dressing/Undressing Lower body dressing/undressing steps patient completed: Thread/unthread right pants leg;Thread/unthread left pants leg;Pull pants up/down;Don/Doff right sock;Don/Doff left sock;Don/Doff right shoe;Don/Doff left shoe;Fasten/unfasten right shoe;Fasten/unfasten left shoe Lower body dressing/undressing: 1: Total-Patient completed less than 25% of tasks  FIM - Toileting Toileting steps completed by patient: Adjust clothing prior to toileting;Performs perineal hygiene;Adjust clothing after toileting Toileting: 0: Activity did not occur  FIM - Diplomatic Services operational officer Devices: Grab bars Toilet Transfers: 2-To toilet/BSC: Max A (lift and lower assist);2-From toilet/BSC: Max A (lift and lower assist)  FIM - Banker Devices: Arm rests Bed/Chair Transfer: 0: Activity did not occur  FIM - Locomotion: Wheelchair Distance: 120 Locomotion: Wheelchair: 2: Travels 50 - 149 ft with minimal  assistance (Pt.>75%) FIM - Locomotion: Ambulation Locomotion: Ambulation Assistive Devices: Other (comment) (hand rail in hallway) Ambulation/Gait Assistance: 2: Max assist Locomotion: Ambulation: 1: Travels less than 50 ft with maximal assistance (Pt: 25 - 49%)  Comprehension Comprehension Mode: Auditory Comprehension: 4-Understands basic 75 - 89% of the time/requires cueing 10 - 24% of the time  Expression Expression Mode: Verbal Expression: 3-Expresses basic 50 - 74% of the time/requires cueing 25 - 50% of the time. Needs to repeat parts of sentences.  Social Interaction Social Interaction: 4-Interacts appropriately 75 - 89% of the time - Needs redirection for appropriate language or to initiate interaction.  Problem Solving Problem Solving: 2-Solves basic 25 - 49% of the time - needs direction more than half the time to initiate, plan or complete simple activities  Memory Memory: 3-Recognizes or recalls 50 - 74% of the time/requires cueing 25 - 49% of the time   Medical Problem List and Plan:  1. DVT Prophylaxis/Anticoagulation: Pharmaceutical: Lovenox  2. Pain Management: Was on started on NSAIDS PTA due to left foot pain. Will monitor for symptoms with increase in activity levels. Continue prn tylenol for now.  3. Mood: patient with poor awareness of deficits. Will monitor for  now. LCSW to follow for formal evaluation. Feels down with poor appetite will trial lexapro, added ritalin for activation 4. Neuropsych: This patient is not capable of making decisions on his/her own behalf.  5. Urinary retention: flomax, Improving on Cipro for e coli 6. [id number]. Off antihypertensives at current time. Will increase hctz to 25mg .   7. Focal motor seizures: continue keppra 1250 mg bid.  8. Insomnia/Hallucinations:  scheduled trazodone at bedtime may contribute to retention will D/C.  9. Symptomatic R-ICA stenosis: continue ASA and plavix. Neurology indicates for surgery past  discharge? 10.  L Hemiplegic shoulder pain support, positioning cont OT, kinesiotape start meds if worsening-feeling better 11. Constipation: He had a supp today for constipation  LOS (Days) 17 A FACE TO FACE EVALUATION WAS PERFORMED  Alex Aldous Housel 04/09/2012, 9:21 AM

## 2012-04-10 NOTE — Progress Notes (Signed)
Patient ID: Shaun Pugh, male   DOB: [birth year], 61 y.o.   MRN: [id number] Shaun Pugh is a 61 y.o. RH-male with history of CAD, TIAs with left visual field deficits as well as dizziness/HA. Admitted on 03/17/12 with HA, left sided weakness and numbness with slurred speech. CT head negative and treated with tPA. CTA head/neck with hypodensity right superior gyrus, ulcerated plaque right ICA with 60% ICA stenosis, suspicion for 10mm focal thrombus proximal aortic arch. Patient developed combativeness with agitation as well as focal motor seizures LUE on 03/18/12. He was treated with ativan and started on Keppra. F/u CCT with extensive area of low density affecting cortical and subcortical right hemisphere in MCA distribution. 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 Placed on ASA and no anticoagulation due to large stroke and risk of bleeding. EEG without epileptiform activity. He did develop fevers and was started on antibiotics for LLL infiltrate. Carotid dopplers done revealing 60-79% ICA stenosis. 2D echo with EF 65-70%   Subjective/Complaints: He had a supp today for constipation. He had a BM No I/O cath yesterday Discussed ritalin trial  Review of Systems  Constitutional: Positive for malaise/fatigue.  Respiratory: Negative for cough.   Cardiovascular: Negative for chest pain.  Neurological: Positive for sensory change, speech change and focal weakness. Negative for seizures.  All other systems reviewed and are negative.    Objective: Vital Signs: Blood pressure 136/76, pulse 75, temperature 97.6 F (36.4 C), temperature source Oral, resp. rate 18, weight 224 lb 3.3 oz (101.7 kg), SpO2 94.00%. No results found. Results for orders placed during the hospital encounter of 03/23/12 (from the past 72 hour(s))  GLUCOSE, CAPILLARY     Status: Abnormal   Collection Time   04/09/12  7:21 AM      Component Value Range Comment   Glucose-Capillary 110 (*) 70 - 99 mg/dL      Nursing note and vitals reviewed.  Constitutional: He appears well-developed and well-nourished.  HENT:  Head: Normocephalic and atraumatic.  Eyes: Pupils are equal, round, and reactive to light.  Neck: Normal range of motion. Neck supple.  Cardiovascular: Normal rate and regular rhythm.  Pulmonary/Chest: Effort normal and breath sounds normal.  Abdominal: Soft. Bowel sounds are normal.  Musculoskeletal: He exhibits no edema.  Neurological: He is alert.  Oriented to self and situation, place, reason... Able to follow basic commands. Dense left hemiparesis.0/5 LUE except 1/5 L triceps  2-/5 L HE/KE and  ankle PF , R side 5/5. Mild left inattention. Skin: Skin is warm and dry.  1/2 FB sublux on L,    Assessment/Plan: 1. Functional deficits secondary to Large R MCA infarct with L HP, L hemisensory def, cognitive deficits which require 3+ hours per day of interdisciplinary therapy in a comprehensive inpatient rehab setting. Physiatrist is providing close team supervision and 24 hour management of active medical problems listed below. Physiatrist and rehab team continue to assess barriers to discharge/monitor patient progress toward functional and medical goals. FIM: FIM - Bathing Bathing Steps Patient Completed: Chest;Left Arm;Abdomen;Front perineal area;Right upper leg;Left upper leg Bathing: 3: Mod-Patient completes 5-7 67f 10 parts or 50-74%  FIM - Upper Body Dressing/Undressing Upper body dressing/undressing steps patient completed: Thread/unthread right sleeve of pullover shirt/dresss;Put head through opening of pull over shirt/dress Upper body dressing/undressing: 3: Mod-Patient completed 50-74% of tasks FIM - Lower Body Dressing/Undressing Lower body dressing/undressing steps patient completed: Thread/unthread right pants leg;Thread/unthread left pants leg;Pull pants up/down;Don/Doff right  sock;Don/Doff left sock;Don/Doff right  shoe;Don/Doff left shoe;Fasten/unfasten right shoe;Fasten/unfasten left shoe Lower body dressing/undressing: 1: Total-Patient completed less than 25% of tasks  FIM - Toileting Toileting steps completed by patient: Adjust clothing prior to toileting;Performs perineal hygiene;Adjust clothing after toileting Toileting: 0: Activity did not occur  FIM - Diplomatic Services operational officer Devices: Grab bars Toilet Transfers: 2-To toilet/BSC: Max A (lift and lower assist);2-From toilet/BSC: Max A (lift and lower assist)  FIM - Banker Devices: Arm rests Bed/Chair Transfer: 0: Activity did not occur  FIM - Locomotion: Wheelchair Distance: 120 Locomotion: Wheelchair: 2: Travels 50 - 149 ft with minimal assistance (Pt.>75%) FIM - Locomotion: Ambulation Locomotion: Ambulation Assistive Devices: Other (comment) (hand rail in hallway) Ambulation/Gait Assistance: 2: Max assist Locomotion: Ambulation: 1: Travels less than 50 ft with maximal assistance (Pt: 25 - 49%)  Comprehension Comprehension Mode: Auditory Comprehension: 4-Understands basic 75 - 89% of the time/requires cueing 10 - 24% of the time  Expression Expression Mode: Verbal Expression: 3-Expresses basic 50 - 74% of the time/requires cueing 25 - 50% of the time. Needs to repeat parts of sentences.  Social Interaction Social Interaction: 4-Interacts appropriately 75 - 89% of the time - Needs redirection for appropriate language or to initiate interaction.  Problem Solving Problem Solving: 2-Solves basic 25 - 49% of the time - needs direction more than half the time to initiate, plan or complete simple activities  Memory Memory: 3-Recognizes or recalls 50 - 74% of the time/requires cueing 25 - 49% of the time   Medical Problem List and Plan:  1. DVT Prophylaxis/Anticoagulation: Pharmaceutical: Lovenox  2. Pain Management: Was on started on NSAIDS PTA due to left foot pain. Will  monitor for symptoms with increase in activity levels. Continue prn tylenol for now.  3. Mood: patient with poor awareness of deficits. Will monitor for now. LCSW to follow for formal evaluation. Feels down with poor appetite will trial lexapro, added ritalin for activation 4. Neuropsych: This patient is not capable of making decisions on his/her own behalf.  5. Urinary retention: flomax, Improving on Cipro for e coli 6. [id number]. Off antihypertensives at current time. Will increase hctz to 25mg .   7. Focal motor seizures: continue keppra 1250 mg bid.  8. Insomnia/Hallucinations:  scheduled trazodone at bedtime may contribute to retention will D/C.  9. Symptomatic R-ICA stenosis: continue ASA and plavix. Neurology indicates for surgery past discharge? 10.  L Hemiplegic shoulder pain support, positioning cont OT, kinesiotape start meds if worsening-feeling better 11. Constipation: better  LOS (Days) 18 A FACE TO FACE EVALUATION WAS PERFORMED  Alex Jakerria Kingbird 04/10/2012, 8:22 AM

## 2012-04-11 ENCOUNTER — Inpatient Hospital Stay (HOSPITAL_COMMUNITY): Payer: Medicare Other

## 2012-04-11 ENCOUNTER — Inpatient Hospital Stay (HOSPITAL_COMMUNITY): Payer: Medicare Other | Admitting: Occupational Therapy

## 2012-04-11 ENCOUNTER — Inpatient Hospital Stay (HOSPITAL_COMMUNITY): Payer: Medicare Other | Admitting: Speech Pathology

## 2012-04-11 DIAGNOSIS — I69922 Dysarthria following unspecified cerebrovascular disease: Secondary | ICD-10-CM

## 2012-04-11 DIAGNOSIS — I6340 Cerebral infarction due to embolism of unspecified cerebral artery: Secondary | ICD-10-CM

## 2012-04-11 DIAGNOSIS — G8110 Spastic hemiplegia affecting unspecified side: Secondary | ICD-10-CM

## 2012-04-11 NOTE — Progress Notes (Signed)
Physical Therapy Note  Patient Details  Name: Shaun Pugh MRN: [id number] Date of Birth: Aug 21, 1950 Today's Date: 04/11/2012  1:30 - 2:05 35 minutes Individual treatment Patient denies pain.  Patient transferred from recliner to wheelchair with minimal assistance. Patient transferred wheelchair to and from simulated car with minimal assistance. Patient used right UE to help lift left LE into and out of car. Patient transferred wheelchair to and from regular double bed with minimal assistance. Patient sit to supine with min assist to get left LE onto bed. Patient supine to sit with minimal assist to steady trunk and left UE. Patient propelled wheelchair 170 feet on level tile with right extremities with supervision and verbal cueing to avoid objects on left. Patient transferred wheelchair to recliner with minimal assist. Quick release secured and call bell in reach.   Arelia Longest M 04/11/2012, 3:27 PM

## 2012-04-11 NOTE — Progress Notes (Signed)
The skilled treatment note has been reviewed and SLP is in agreement. Maxemiliano Riel, M.A., CCC-SLP [phone]  

## 2012-04-11 NOTE — Progress Notes (Signed)
Patient ID: Shaun Pugh, male   DOB: [birth year], 61 y.o.   MRN: [id number] Shaun Pugh is a 61 y.o. RH-male with history of CAD, TIAs with left visual field deficits as well as dizziness/HA. Admitted on 03/17/12 with HA, left sided weakness and numbness with slurred speech. CT head negative and treated with tPA. CTA head/neck with hypodensity right superior gyrus, ulcerated plaque right ICA with 60% ICA stenosis, suspicion for 10mm focal thrombus proximal aortic arch. Patient developed combativeness with agitation as well as focal motor seizures LUE on 03/18/12. He was treated with ativan and started on Keppra. F/u CCT with extensive area of low density affecting cortical and subcortical right hemisphere in MCA distribution. 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 Placed on ASA and no anticoagulation due to large stroke and risk of bleeding. EEG without epileptiform activity. He did develop fevers and was started on antibiotics for LLL infiltrate. Carotid dopplers done revealing 60-79% ICA stenosis. 2D echo with EF 65-70%   Subjective/Complaints: PVRs decreasing  Review of Systems  Constitutional: Positive for malaise/fatigue.  Respiratory: Negative for cough.   Cardiovascular: Negative for chest pain.  Neurological: Positive for sensory change, speech change and focal weakness. Negative for seizures.  All other systems reviewed and are negative.    Objective: Vital Signs: Blood pressure 134/86, pulse 77, temperature 97.9 F (36.6 C), temperature source Oral, resp. rate 18, weight 101.7 kg (224 lb 3.3 oz), SpO2 94.00%. No results found. Results for orders placed during the hospital encounter of 03/23/12 (from the past 72 hour(s))  GLUCOSE, CAPILLARY     Status: Abnormal   Collection Time   04/09/12  7:21 AM      Component Value Range Comment   Glucose-Capillary 110 (*) 70 - 99 mg/dL      Nursing note and vitals reviewed.    Constitutional: He appears well-developed and well-nourished.  HENT:  Head: Normocephalic and atraumatic.  Eyes: Pupils are equal, round, and reactive to light.  Neck: Normal range of motion. Neck supple.  Cardiovascular: Normal rate and regular rhythm.  Pulmonary/Chest: Effort normal and breath sounds normal.  Abdominal: Soft. Bowel sounds are normal.  Musculoskeletal: He exhibits no edema.  Neurological: He is alert.  Oriented to self and situation, place, reason... Able to follow basic commands. Dense left hemiparesis.0/5 LUE except1/5 L finger flexors 1/5 L triceps  3-/5 L HE/KE and  ankle PF , R side 5/5. Mild left inattention. Skin: Skin is warm and dry.  1/2 FB sublux on L,    Assessment/Plan: 1. Functional deficits secondary to Large R MCA infarct with L HP, L hemisensory def, cognitive deficits which require 3+ hours per day of interdisciplinary therapy in a comprehensive inpatient rehab setting. Physiatrist is providing close team supervision and 24 hour management of active medical problems listed below. Physiatrist and rehab team continue to assess barriers to discharge/monitor patient progress toward functional and medical goals. FIM: FIM - Bathing Bathing Steps Patient Completed: Chest;Left Arm;Abdomen;Front perineal area;Right upper leg;Left upper leg Bathing: 3: Mod-Patient completes 5-7 44f 10 parts or 50-74%  FIM - Upper Body Dressing/Undressing Upper body dressing/undressing steps patient completed: Thread/unthread right sleeve of pullover shirt/dresss;Put head through opening of pull over shirt/dress Upper body dressing/undressing: 3: Mod-Patient completed 50-74% of tasks FIM - Lower Body Dressing/Undressing Lower body dressing/undressing steps patient completed: Thread/unthread right pants leg;Thread/unthread left pants leg;Pull pants up/down;Don/Doff right sock;Don/Doff left sock;Don/Doff right shoe;Don/Doff left shoe;Fasten/unfasten right shoe;Fasten/unfasten left  shoe Lower body dressing/undressing: 1: Total-Patient completed less than 25% of tasks  FIM - Toileting Toileting steps completed by patient: Adjust clothing prior to toileting;Performs perineal hygiene;Adjust clothing after toileting Toileting: 0: Activity did not occur  FIM - Diplomatic Services operational officer Devices: Grab bars Toilet Transfers: 2-To toilet/BSC: Max A (lift and lower assist);2-From toilet/BSC: Max A (lift and lower assist)  FIM - Banker Devices: Arm rests Bed/Chair Transfer: 0: Activity did not occur  FIM - Locomotion: Wheelchair Distance: 120 Locomotion: Wheelchair: 2: Travels 50 - 149 ft with minimal assistance (Pt.>75%) FIM - Locomotion: Ambulation Locomotion: Ambulation Assistive Devices: Other (comment) (hand rail in hallway) Ambulation/Gait Assistance: 2: Max assist Locomotion: Ambulation: 1: Travels less than 50 ft with maximal assistance (Pt: 25 - 49%)  Comprehension Comprehension Mode: Auditory Comprehension: 4-Understands basic 75 - 89% of the time/requires cueing 10 - 24% of the time  Expression Expression Mode: Verbal Expression: 3-Expresses basic 50 - 74% of the time/requires cueing 25 - 50% of the time. Needs to repeat parts of sentences.  Social Interaction Social Interaction: 4-Interacts appropriately 75 - 89% of the time - Needs redirection for appropriate language or to initiate interaction.  Problem Solving Problem Solving: 2-Solves basic 25 - 49% of the time - needs direction more than half the time to initiate, plan or complete simple activities  Memory Memory: 3-Recognizes or recalls 50 - 74% of the time/requires cueing 25 - 49% of the time   Medical Problem List and Plan:  1. DVT Prophylaxis/Anticoagulation: Pharmaceutical: Lovenox  2. Pain Management: Was on started on NSAIDS PTA due to left foot pain. Will monitor for symptoms with increase in activity levels. Continue prn tylenol  for now.  3. Mood: patient with poor awareness of deficits. Will monitor for now. LCSW to follow for formal evaluation. Feels down with poor appetite will trial lexapro 4. Neuropsych: This patient is not capable of making decisions on his/her own behalf.  5. Urinary retention no obvious meds check UA and trial flomax 6. WUJ:[id number]. Off antihypertensives at current time. Will increase hctz to 25mg . Check bmet monday 7. Focal motor seizures: continue keppra 1250 mg bid.  8. Insomnia/Hallucinations:  scheduled trazodone at bedtime may contribute to retention will D/C.  9. Symptomatic R-ICA stenosis: continue ASA and plavix. Neurology indicates for surgery past discharge? 10.  L Hemiplegic shoulder pain support, positioning cont OT, kinesiotape start meds if worsening-feeling better  LOS (Days) 19 A FACE TO FACE EVALUATION WAS PERFORMED  KIRSTEINS,ANDREW E 04/11/2012, 7:08 AM

## 2012-04-11 NOTE — Progress Notes (Signed)
Physical Therapy Session Note  Patient Details  Name: Shaun Pugh MRN: [id number] Date of Birth: 06-23-1950  Today's Date: 04/11/2012 Time: 4098-1191 Time Calculation (min): 40 min  Short Term Goals: Week 3:  PT Short Term Goal 1 (Week 3): Patient will move supine to and from sit with minimal steadying assist and verbal cueing PT Short Term Goal 2 (Week 3): Patient will transfer to right and left with minimal assistance. PT Short Term Goal 3 (Week 3): Patient will propel wheelchair 150 feet with supervision and minimal verbal cueing PT Short Term Goal 4 (Week 3): Patient will stand 5 minutes with +1 max assist with UE support PT Short Term Goal 5 (Week 3): Patient will ambulate 25 feet with +1 max assist and least restrictive assistive device.  Skilled Therapeutic Interventions/Progress Updates:   Pt's attention and initiation improved over last week.   W/c mobility x 120' with min to mod assist for L attention, steering to avoid obstacles on L.  neuromuscular re-education via forced use, tactile and VCs for:  -Step ups LLE working on L stance stabilty. -Gait with EVA RW, wearing L Allard AFO for foot drop control, +2 assist for someone to follow with w/c, pt requring max assist for L foot placement, upright trunk, forward gaze.  Pt remains in forward flexed posture, wt on R forearm, throughout. -bil alternating knee extensions in sitting -L hip abduction/adduction in sitting, with total assist fading to mod assist.  Pt has active movement of abductors, but is not able to attend to his LLE long enough to complete the movement.    Therapy Documentation Precautions:  Precautions Precautions: Fall Precaution Comments: Left sided weakness, right gaze preference, left inattention/neglect, left shoulder subluxation, Restrictions Weight Bearing Restrictions: No   Pain: Pain Assessment Pain Assessment: No/denies pain      See FIM for current functional status  Therapy/Group:  Individual Therapy  Cyndy Braver 04/11/2012, 11:14 AM

## 2012-04-11 NOTE — Progress Notes (Signed)
Occupational Therapy Session Note  Patient Details  Name: Shaun Pugh MRN: [id number] Date of Birth: 11-12-1950  Today's Date: 04/11/2012 Time: 1130-1155 Time Calculation (min): 25 min  Short Term Goals: Week 2:  OT Short Term Goal 1 (Week 2): Patient will perform dressing with Max Assist. OT Short Term Goal 2 (Week 2): Patient will increase sitting balance from Poor+ to Fair- to increase functional performance during bathing and dressing. OT Short Term Goal 3 (Week 2): Patient will increase toilet transfer to Max Assist. OT Short Term Goal 4 (Week 2): Patient will initiate dressing and bathing tasks without vc's.  Skilled Therapeutic Interventions/Progress Updates:  Patient resting in w/c with QRB in place upon arrival.  Engaged in toilet transfer training, toilet tasks, stand at sink to wash hands and transfer w/c recliner.  Focused session on body and spatial visual attention to left, forced use of LUE to lock breaks, safe left foot placement before putting weight through it for squat or stand, lateral weight shifts to the left, standing balance.  With use of grab bars and min cues for transfer technique/set up, patient was supervision for w/c>wide 3 in 1 placed over commode and min assist for transfer to patient's left to get off the commode.  Patient was steady assist while he performed pants down and up.  Therapy Documentation Precautions:  Precautions Precautions: Fall Precaution Comments: Left sided weakness, right gaze preference, left inattention/neglect, left shoulder subluxation, Restrictions Weight Bearing Restrictions: No Pain: Pain Assessment Pain Assessment: No/denies pain ADL:  See FIM for current functional status  Therapy/Group: Individual Therapy  Donie Moulton 04/11/2012, 12:17 PM

## 2012-04-11 NOTE — Progress Notes (Signed)
Speech Language Pathology Daily Session Note  Patient Details  Name: Shaun Pugh MRN: [id number] Date of Birth: 02-10-1951  Today's Date: 04/11/2012 Time: 0830-0930 Time Calculation (min): 60 min  Short Term Goals: Week 3: SLP Short Term Goal 1 (Week 3): Patient will utilize lingual sweep consuming regular textures and thin liquids with modified independence. SLP Short Term Goal 2 (Week 3): Patient will solve basic problems during functional tasks with mod A visual and verbal cueing. SLP Short Term Goal 3 (Week 3): Patient will attend to the left during basic functional tasks with mod A verbal cuing.  SLP Short Term Goal 4 (Week 3): Patient will demonstrate emergent awareness by asking for help when needed with max A verbal cueing.  SLP Short Term Goal 5 (Week 3): Patient will alternate attention between the clinician and a basic functional task with mod A verbal and visual cues for redirection. SLP Short Term Goal 6 (Week 3): Patient will utilize external aids to facilitate recall of new, daily information with max A multimodal cueing.   Skilled Therapeutic Interventions: Skilled treatment session targeted cognition.  Patient selectively attended for periods of 10-15 minutes to a variety of functional tasks in a moderately distracting environment with overall min A verbal cues for redirection.  SLP facilitated session with overall min A verbal and visual cuing to cross midline and pay attention to left environment during functional tasks.    Patient required mod faded to min A verbal and visual cues to solve basic problems during a card game, which the SLP modified to reduce visual distractions.     FIM:  Comprehension Comprehension Mode: Auditory Comprehension: 4-Understands basic 75 - 89% of the time/requires cueing 10 - 24% of the time Expression Expression Mode: Verbal Expression: 4-Expresses basic 75 - 89% of the time/requires cueing 10 - 24% of the time. Needs helper to occlude  trach/needs to repeat words. Social Interaction Social Interaction: 4-Interacts appropriately 75 - 89% of the time - Needs redirection for appropriate language or to initiate interaction. Problem Solving Problem Solving: 4-Solves basic 75 - 89% of the time/requires cueing 10 - 24% of the time Memory Memory: 4-Recognizes or recalls 75 - 89% of the time/requires cueing 10 - 24% of the time FIM - Eating Eating Activity: 0: Activity did not occur  Pain Pain Assessment Pain Assessment: No/denies pain  Therapy/Group: Individual Therapy  Jackalyn Lombard, Conrad [place]  Graduate Clinician Speech Language Pathology  Emet Rafanan, Joni Reining 04/11/2012, 1:17 PM

## 2012-04-11 NOTE — Progress Notes (Signed)
Occupational Therapy Session Note  Patient Details  Name: Shaun Pugh MRN: [id number] Date of Birth: 12/07/1950  Today's Date: 04/11/2012 Time: 0930-1030 Time Calculation (min): 60 min  Short Term Goals: Week 2:  OT Short Term Goal 1 (Week 2): Patient will perform dressing with Max Assist. OT Short Term Goal 2 (Week 2): Patient will increase sitting balance from Poor+ to Fair- to increase functional performance during bathing and dressing. OT Short Term Goal 3 (Week 2): Patient will increase toilet transfer to Max Assist. OT Short Term Goal 4 (Week 2): Patient will initiate dressing and bathing tasks without vc's.  Skilled Therapeutic Interventions/Progress Updates:    ADL re-training completed in shower this AM. Pt states that his wife and him will be living in a rented home that is handicap accessible. Session with focus on hemi-dressing technique, ADL performance, functional transfers, standing balance, and direction following. Patient needed vc's when standing at sink during LB dressing to shift weight to right leg as he leaned heavily laterally. Patient did participate more in LB dressing task with Mod-Max vc's when donning sweatpants. Pt still struggling with hemi-dressing technique when donning shirt.   Therapy Documentation Precautions:  Precautions Precautions: Fall Precaution Comments: Left sided weakness, right gaze preference, left inattention/neglect, left shoulder subluxation, Restrictions Weight Bearing Restrictions: No Pain: Pain Assessment Pain Assessment: No/denies pain  See FIM for current functional status  Therapy/Group: Individual Therapy  Limmie Patricia, OTR/L 04/11/2012, 11:39 AM

## 2012-04-12 ENCOUNTER — Inpatient Hospital Stay (HOSPITAL_COMMUNITY): Payer: Medicare Other

## 2012-04-12 ENCOUNTER — Inpatient Hospital Stay (HOSPITAL_COMMUNITY): Payer: Medicare Other | Admitting: Occupational Therapy

## 2012-04-12 DIAGNOSIS — I69922 Dysarthria following unspecified cerebrovascular disease: Secondary | ICD-10-CM

## 2012-04-12 DIAGNOSIS — G8110 Spastic hemiplegia affecting unspecified side: Secondary | ICD-10-CM

## 2012-04-12 DIAGNOSIS — I6340 Cerebral infarction due to embolism of unspecified cerebral artery: Secondary | ICD-10-CM

## 2012-04-12 MED ORDER — TAMSULOSIN HCL 0.4 MG PO CAPS
0.8000 mg | ORAL_CAPSULE | Freq: Every day | ORAL | Status: DC
  Administered 2012-04-12 – 2012-04-19 (×6): 0.8 mg via ORAL
  Filled 2012-04-12 (×9): qty 2

## 2012-04-12 NOTE — Progress Notes (Signed)
Physical Therapy Weekly Progress Note  Patient Details  Name: Shaun Pugh MRN: [id number] Date of Birth: 09-22-1950  Today's Date: 04/12/2012 Time: 6440-3474 Time Calculation (min): 45 min  Patient has met 5 of 5 short term goals.  Patient has made significant improvements this week. He has been more participatory and has gotten more active return in left LE.   Patient continues to demonstrate the following deficits: left hemiplegia affecting UE greater than LE, left inattention/neglect, decreased postural control with tendency to push to left, dependency with functional mobility and therefore will continue to benefit from skilled PT intervention to enhance overall performance with activity tolerance, balance, postural control and functional use of  left upper extremity and left lower extremity.  Patient progressing toward long term goals..  Plan of care revisions: LTG's regarding dynamic standing balance and stairs have been downgraded as patient will likely not reach minimal assistance in those areas. Household ambulation goal has been discontinued. Patient will need continued therapy to become functional ambulator..  PT Short Term Goals Week 3:  PT Short Term Goal 1 (Week 3): Patient will move supine to and from sit with minimal steadying assist and verbal cueing PT Short Term Goal 1 - Progress (Week 3): Met PT Short Term Goal 2 (Week 3): Patient will transfer to right and left with minimal assistance. PT Short Term Goal 2 - Progress (Week 3): Met PT Short Term Goal 3 (Week 3): Patient will propel wheelchair 150 feet with supervision and minimal verbal cueing PT Short Term Goal 3 - Progress (Week 3): Met PT Short Term Goal 4 (Week 3): Patient will stand 5 minutes with +1 max assist with UE support PT Short Term Goal 4 - Progress (Week 3): Met PT Short Term Goal 5 (Week 3): Patient will ambulate 25 feet with +1 max assist and least restrictive assistive device. PT Short Term Goal 5 -  Progress (Week 3): Met Week 4:  PT Short Term Goal 1 (Week 4): STG's = LTG's  Skilled Therapeutic Interventions/Progress Updates:  PM treatment consisted of continued family education with wife, Arline Asp. Patient and wife demonstrated independence with transfer wheelchair to and from hospital bed as well as bed mobility. Patient actually performed supine to and from sitting with verbal cueing and bed rail - no physical assistance. Patient stood at table for 5.5 minutes while performing task with right UE with moderate assistance for balance.  Therapy Documentation Precautions:  Precautions Precautions: Fall Precaution Comments: Left sided weakness, right gaze preference, left inattention/neglect, left shoulder subluxation, Restrictions Weight Bearing Restrictions: No  Pain: Pain Assessment Pain Assessment: No/denies pain  Locomotion : Ambulation Ambulation/Gait Assistance: 3: Mod assist   See FIM for current functional status  Therapy/Group: Individual Therapy  Arelia Longest M 04/12/2012, 4:16 PM

## 2012-04-12 NOTE — Progress Notes (Signed)
Nutrition Follow-up  Intervention:   Encouraged intake D/C Ensure Complete bid D/C Carnation Instant Breakfast/High Protein Shake  Assessment:   Spoke with patient.  Reports eating better with improved appetite.  Dislikes Ensure and  [employer] and will not drink these.  Weight has been decreasing.  Wt loss of 35#.  Pt remains overweight but need for slower weight loss with improved intake.  Diet Order:  Reg with Carnation Instant Breakfast or High protein Shake tid with meals.  Ensure Complete bid  Meds: Scheduled Meds:    . aspirin EC  325 mg Oral Daily  . atorvastatin  20 mg Oral q1800  . ciprofloxacin  250 mg Oral BID  . clopidogrel  75 mg Oral Q breakfast  . enoxaparin  40 mg Subcutaneous Q24H  . feeding supplement  237 mL Oral BID BM  . fluticasone  1 spray Each Nare Daily  . hydrochlorothiazide  25 mg Oral Daily  . levETIRAcetam  1,250 mg Oral BID  . methylphenidate  5 mg Oral BID WC  . pantoprazole  40 mg Oral Daily  . potassium chloride  10 mEq Oral BID  . senna-docusate  2 tablet Oral BID  . Tamsulosin HCl  0.8 mg Oral QPC supper  . [DISCONTINUED] Tamsulosin HCl  0.4 mg Oral QPC supper   Continuous Infusions:  PRN Meds:.acetaminophen, albuterol, alum & mag hydroxide-simeth, bisacodyl, guaiFENesin-dextromethorphan, ipratropium, polyethylene glycol, prochlorperazine, prochlorperazine, prochlorperazine   CMP     Component Value Date/Time   NA 133* 04/04/2012 0828   K 4.1 04/04/2012 0828   CL 94* 04/04/2012 0828   CO2 22 04/04/2012 0828   GLUCOSE 144* 04/04/2012 0828   BUN 25* 04/04/2012 0828   CREATININE 1.22 04/06/2012 0603   CALCIUM 10.5 04/04/2012 0828   PROT 7.2 03/24/2012 0615   ALBUMIN 3.1* 03/24/2012 0615   AST 19 03/24/2012 0615   ALT 35 03/24/2012 0615   ALKPHOS 61 03/24/2012 0615   BILITOT 0.6 03/24/2012 0615   GFRNONAA 62* 04/06/2012 0603   GFRAA 72* 04/06/2012 0603    CBG (last 3)  No results found for this basename: GLUCAP:3  in the last 72 hours   Intake/Output Summary (Last 24 hours) at 04/12/12 1103 Last data filed at 04/12/12 0800  Gross per 24 hour  Intake    600 ml  Output   1125 ml  Net   -525 ml    Weight Status:  101.7 kg decreased from 117.7 kg 11/20  Re-estimated needs:  2100-2300 kcal, 110-120 gm protein  Nutrition Dx:  Inadequate oral intake r/t decreased appetite progressing AEB reported and documented intake  Goal:  Intake of >75% meals and supplements  Monitor:  Intake, labs, weight   Oran Rein, RD, LDN Clinical Inpatient Dietitian Pager:  [phone] Weekend and after hours pager:  [phone]

## 2012-04-12 NOTE — Progress Notes (Addendum)
Speech Language Pathology Daily Session Note  Patient Details  Name: Shaun Pugh MRN: [id number] Date of Birth: [birth year]  Today's Date: 04/12/2012 Time: 1145-1200 Time Calculation (min): 15 min  Short Term Goals: Week 3: SLP Short Term Goal 1 (Week 3): Patient will utilize lingual sweep consuming regular textures and thin liquids with modified independence. SLP Short Term Goal 2 (Week 3): Patient will solve basic problems during functional tasks with mod A visual and verbal cueing. SLP Short Term Goal 3 (Week 3): Patient will attend to the left during basic functional tasks with mod A verbal cuing.  SLP Short Term Goal 4 (Week 3): Patient will demonstrate emergent awareness by asking for help when needed with max A verbal cueing.  SLP Short Term Goal 5 (Week 3): Patient will alternate attention between the clinician and a basic functional task with mod A verbal and visual cues for redirection. SLP Short Term Goal 6 (Week 3): Patient will utilize external aids to facilitate recall of new, daily information with max A multimodal cueing.   Skilled Therapeutic Interventions: Pt seen in [employer] with focus on cognitive and dysphagia goals. Pt consumed regular textures and thin liquid without overt s/s of aspiration and utilized swallowing compensatory strategies with supervision question cues. Pt attended to left environment throughout functional task with supervision verbal cues.    FIM:  Comprehension Comprehension Mode: Auditory Comprehension: 4-Understands basic 75 - 89% of the time/requires cueing 10 - 24% of the time Expression Expression Mode: Verbal Expression: 4-Expresses basic 75 - 89% of the time/requires cueing 10 - 24% of the time. Needs helper to occlude trach/needs to repeat words. Social Interaction Social Interaction: 4-Interacts appropriately 75 - 89% of the time - Needs redirection for appropriate language or to initiate interaction. Problem Solving Problem Solving:  4-Solves basic 75 - 89% of the time/requires cueing 10 - 24% of the time Memory Memory: 4-Recognizes or recalls 75 - 89% of the time/requires cueing 10 - 24% of the time FIM - Eating Eating Activity: 5: Set-up assist for open containers  Pain Pain Assessment Pain Assessment: No/denies pain  Therapy/Group: Dysphagia Group  [name] 04/12/2012, 3:44 PM

## 2012-04-12 NOTE — Progress Notes (Signed)
Physical Therapy Note  Patient Details  Name: Shaun Pugh MRN: [id number] Date of Birth: 1951/02/04 Today's Date: 04/12/2012  Time In:  11:30  Time Out:  12:00.  OT charge of 15 minutes as group was co-led with speech therapy.  Patient participated in a self feeding group with emphasis on taking small bites, increasing PO intake, patient and family education (wife present), clearing his airway and small sips.  Patient will continue to benefit from this group intervention.   Norton Pastel 04/12/2012, 5:59 PM

## 2012-04-12 NOTE — Progress Notes (Signed)
Physical Therapy Session Note  Patient Details  Name: Shaun Pugh MRN: [id number] Date of Birth: November 12, 1950  Today's Date: 04/12/2012 Time: 1000-1057 Time Calculation (min): 57 min  Short Term Goals: Week 3:  PT Short Term Goal 1 (Week 3): Patient will move supine to and from sit with minimal steadying assist and verbal cueing PT Short Term Goal 2 (Week 3): Patient will transfer to right and left with minimal assistance. PT Short Term Goal 3 (Week 3): Patient will propel wheelchair 150 feet with supervision and minimal verbal cueing PT Short Term Goal 4 (Week 3): Patient will stand 5 minutes with +1 max assist with UE support PT Short Term Goal 5 (Week 3): Patient will ambulate 25 feet with +1 max assist and least restrictive assistive device.  Skilled Therapeutic Interventions/Progress Updates:  Session concentrated on education with spouse. Patient performed transfer to and from regular double bed with minimal steadying assistance. Patient able to demonstrate with wife's assistance. Patient performed supine to and from sit with minimal assistance for left extremities. Patient's wife able to demonstrate assisting patient with bed mobility with cueing. Patient performed simulated car transfer with minimal assistance. Patient able to demonstrate with wife's assistance. Patient ambulated with rail in hallway 30 feet with moderate assistance. Patient is able to progress left LE with occasional verbal cueing to increase step length on left. Patient propelled wheelchair using right extremities with cueing to stay right and avoid objects on left. Patient's wife safely transferred patient to recliner with min assist.   Therapy Documentation Precautions:  Precautions Precautions: Fall Precaution Comments: Left sided weakness, right gaze preference, left inattention/neglect, left shoulder subluxation, Restrictions Weight Bearing Restrictions: No Pain: Pain Assessment Pain Assessment: No/denies  pain Locomotion : Ambulation Ambulation/Gait Assistance: 3: Mod assist   See FIM for current functional status  Therapy/Group: Individual Therapy  Arelia Longest M 04/12/2012, 1:08 PM

## 2012-04-12 NOTE — Progress Notes (Signed)
Occupational Therapy Session Note  Patient Details  Name: Shaun Pugh MRN: [id number] Date of Birth: [birth year]  Today's Date: 04/12/2012 Time: 0830-0930 Time Calculation (min): 60 min  Short Term Goals: Week 2:  OT Short Term Goal 1 (Week 2): Patient will perform dressing with Max Assist. OT Short Term Goal 2 (Week 2): Patient will increase sitting balance from Poor+ to Fair- to increase functional performance during bathing and dressing. OT Short Term Goal 3 (Week 2): Patient will increase toilet transfer to Max Assist. OT Short Term Goal 4 (Week 2): Patient will initiate dressing and bathing tasks without vc's.  Skilled Therapeutic Interventions/Progress Updates:    ADL re-training completed in shower this AM. Patient needed mod-max encouragement to get out of bed this AM. Session with focus on ADL performance, hemi dressing techniques, functional transfers, and sitting and standing balance. Patient completed transfer from bed to shower chair with Min assist. Patient stood at bar in bathroom and pulled down sweatpants. Assist was needed to doff socks and pants completely once sitting. After shower patient transferred with Min Assist to w/c and completed dressing with wife observing. Patient donned sweatpants with Max vc's to complete task. Assist needed with donning socks and shoes. Next session would like to practice shower in tub shower combo if possible.  Therapy Documentation Precautions:  Precautions Precautions: Fall Precaution Comments: Left sided weakness, right gaze preference, left inattention/neglect, left shoulder subluxation, Restrictions Weight Bearing Restrictions: No Pain: Pain Assessment Pain Assessment: No/denies pain Pain Score: 0-No pain Faces Pain Scale: No hurt  See FIM for current functional status  Therapy/Group: Individual Therapy  Limmie Patricia, OTR/L 04/12/2012, 11:30 AM

## 2012-04-12 NOTE — Progress Notes (Signed)
Patient ID: EMANUELLE BASTOS, male   DOB: 09-12-1950, 61 y.o.   MRN: [id number] ONYX SCHIRMER is a 61 y.o. RH-male with history of CAD, TIAs with left visual field deficits as well as dizziness/HA. Admitted on 03/17/12 with HA, left sided weakness and numbness with slurred speech. CT head negative and treated with tPA. CTA head/neck with hypodensity right superior gyrus, ulcerated plaque right ICA with 60% ICA stenosis, suspicion for 10mm focal thrombus proximal aortic arch. Patient developed combativeness with agitation as well as focal motor seizures LUE on 03/18/12. He was treated with ativan and started on Keppra. F/u CCT with extensive area of low density affecting cortical and subcortical right hemisphere in MCA distribution. 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 Placed on ASA and no anticoagulation due to large stroke and risk of bleeding. EEG without epileptiform activity. He did develop fevers and was started on antibiotics for LLL infiltrate. Carotid dopplers done revealing 60-79% ICA stenosis. 2D echo with EF 65-70%   Subjective/Complaints: Voided this am  Review of Systems  Constitutional: Positive for malaise/fatigue.  Respiratory: Negative for cough.   Cardiovascular: Negative for chest pain.  Neurological: Positive for sensory change, speech change and focal weakness. Negative for seizures.  All other systems reviewed and are negative.    Objective: Vital Signs: Blood pressure 130/78, pulse 69, temperature 97.7 F (36.5 C), temperature source Oral, resp. rate 19, weight 101.7 kg (224 lb 3.3 oz), SpO2 97.00%. No results found. Results for orders placed during the hospital encounter of 03/23/12 (from the past 72 hour(s))  GLUCOSE, CAPILLARY     Status: Abnormal   Collection Time   04/09/12  7:21 AM      Component Value Range Comment   Glucose-Capillary 110 (*) 70 - 99 mg/dL      Nursing note and vitals reviewed.   Constitutional: He appears well-developed and well-nourished.  HENT:  Head: Normocephalic and atraumatic.  Eyes: Pupils are equal, round, and reactive to light.  Neck: Normal range of motion. Neck supple.  Cardiovascular: Normal rate and regular rhythm.  Pulmonary/Chest: Effort normal and breath sounds normal.  Abdominal: Soft. Bowel sounds are normal.  Musculoskeletal: He exhibits no edema.  Neurological: He is alert.  Oriented to self and situation, place, reason... Able to follow basic commands. Dense left hemiparesis.0/5 LUE except1/5 L finger flexors 1/5 L triceps  3-/5 L HE/KE and  ankle PF , R side 5/5. Mild left inattention. Skin: Skin is warm and dry.  1/2 FB sublux on L,    Assessment/Plan: 1. Functional deficits secondary to Large R MCA infarct with L HP, L hemisensory def, cognitive deficits which require 3+ hours per day of interdisciplinary therapy in a comprehensive inpatient rehab setting. Physiatrist is providing close team supervision and 24 hour management of active medical problems listed below. Physiatrist and rehab team continue to assess barriers to discharge/monitor patient progress toward functional and medical goals. FIM: FIM - Bathing Bathing Steps Patient Completed: Chest;Left Arm;Abdomen;Front perineal area;Right upper leg;Left upper leg Bathing: 3: Mod-Patient completes 5-7 70f 10 parts or 50-74%  FIM - Upper Body Dressing/Undressing Upper body dressing/undressing steps patient completed: Put head through opening of pull over shirt/dress;Pull shirt over trunk Upper body dressing/undressing: 3: Mod-Patient completed 50-74% of tasks FIM - Lower Body Dressing/Undressing Lower body dressing/undressing steps patient completed: Thread/unthread right pants leg;Thread/unthread left pants leg;Pull pants up/down Lower body dressing/undressing: 2: Max-Patient completed 25-49% of tasks  FIM - Toileting  Toileting steps completed by patient: Adjust clothing prior to  toileting;Performs perineal hygiene;Adjust clothing after toileting Toileting Assistive Devices: Grab bar or rail for support Toileting: 4: Steadying assist  FIM - Diplomatic Services operational officer Devices: Grab bars Toilet Transfers: 5-To toilet/BSC: Supervision (verbal cues/safety issues);5-From toilet/BSC: Supervision (verbal cues/safety issues)  FIM - Press photographer Assistive Devices: Arm rests Bed/Chair Transfer: 4: Supine > Sit: Min A (steadying Pt. > 75%/lift 1 leg);4: Sit > Supine: Min A (steadying pt. > 75%/lift 1 leg);4: Bed > Chair or W/C: Min A (steadying Pt. > 75%);4: Chair or W/C > Bed: Min A (steadying Pt. > 75%)  FIM - Locomotion: Wheelchair Distance: 120 Locomotion: Wheelchair: 4: Travels 150 ft or more: maneuvers on rugs and over door sillls with minimal assistance (Pt.>75%) FIM - Locomotion: Ambulation Locomotion: Ambulation Assistive Devices: Interior and spatial designer Ambulation/Gait Assistance: 2: Max assist Locomotion: Ambulation: 1: Two helpers (one person following with w/c)  Comprehension Comprehension Mode: Auditory Comprehension: 4-Understands basic 75 - 89% of the time/requires cueing 10 - 24% of the time  Expression Expression Mode: Verbal Expression: 4-Expresses basic 75 - 89% of the time/requires cueing 10 - 24% of the time. Needs helper to occlude trach/needs to repeat words.  Social Interaction Social Interaction: 4-Interacts appropriately 75 - 89% of the time - Needs redirection for appropriate language or to initiate interaction.  Problem Solving Problem Solving: 4-Solves basic 75 - 89% of the time/requires cueing 10 - 24% of the time  Memory Memory: 4-Recognizes or recalls 75 - 89% of the time/requires cueing 10 - 24% of the time   Medical Problem List and Plan:  1. DVT Prophylaxis/Anticoagulation: Pharmaceutical: Lovenox  2. Pain Management: Was on started on NSAIDS PTA due to left foot pain. Will monitor for symptoms  with increase in activity levels. Continue prn tylenol for now.  3. Mood: patient with poor awareness of deficits.. Feels down  appetite improved with lexapro and ritalin 4. Neuropsych: This patient is not capable of making decisions on his/her own behalf.  5. Urinary retention no obvious meds check UA and trial flomax 6. ZOX:[id number]. Off antihypertensives at current time. Will increase hctz to 25mg . Check bmet monday 7. Focal motor seizures: continue keppra 1250 mg bid.  8. Insomnia/Hallucinations:  scheduled trazodone at bedtime may contribute to retention will D/C.  9. Symptomatic R-ICA stenosis: continue ASA and plavix. Neurology indicates for surgery past discharge?   LOS (Days) 20 A FACE TO FACE EVALUATION WAS PERFORMED  KIRSTEINS,ANDREW E 04/12/2012, 7:12 AM

## 2012-04-13 ENCOUNTER — Inpatient Hospital Stay (HOSPITAL_COMMUNITY): Payer: Medicare Other | Admitting: Occupational Therapy

## 2012-04-13 ENCOUNTER — Inpatient Hospital Stay (HOSPITAL_COMMUNITY): Payer: Medicare Other

## 2012-04-13 ENCOUNTER — Inpatient Hospital Stay (HOSPITAL_COMMUNITY): Payer: Medicare Other | Admitting: Speech Pathology

## 2012-04-13 LAB — CREATININE, SERUM
Creatinine, Ser: 1.04 mg/dL (ref 0.50–1.35)
GFR calc non Af Amer: 75 mL/min — ABNORMAL LOW (ref >90)

## 2012-04-13 MED ORDER — METHYLPHENIDATE HCL 5 MG PO TABS
10.0000 mg | ORAL_TABLET | Freq: Two times a day (BID) | ORAL | Status: DC
  Administered 2012-04-13 – 2012-04-20 (×14): 10 mg via ORAL
  Filled 2012-04-13 (×3): qty 2
  Filled 2012-04-13: qty 1
  Filled 2012-04-13: qty 2
  Filled 2012-04-13: qty 1
  Filled 2012-04-13 (×9): qty 2

## 2012-04-13 NOTE — Progress Notes (Signed)
Occupational Therapy Session Note  Patient Details  Name: Shaun Pugh MRN: [id number] Date of Birth: [birth year]  Today's Date: 04/13/2012 Time: 0830-1000 Time Calculation (min): 90 min  Short Term Goals: Week 2:  OT Short Term Goal 1 (Week 2): Patient will perform dressing with Max Assist. OT Short Term Goal 2 (Week 2): Patient will increase sitting balance from Poor+ to Fair- to increase functional performance during bathing and dressing. OT Short Term Goal 3 (Week 2): Patient will increase toilet transfer to Max Assist. OT Short Term Goal 4 (Week 2): Patient will initiate dressing and bathing tasks without vc's.  Skilled Therapeutic Interventions/Progress Updates:    Family education completed with wife this AM during ADL. Wife performed bed mobility and transfer from bed to w/c with supervision from therapist. Shower completed in tub/shower combo w/ tub bench. Education wife and patient on proper transfer techniques and safety techniques during bathing. Wife performed transfer from w/c <>tub bench and assist patient with bathing with supervision and vc's from therapist. Wife assisted with dressing with Min Assist from therapist for safety when standing. Wife doesn't think patient will be able to use bathroom sink to dress so practiced using bed for dressing. Wife and patient completed family education successfully. Will practice shower in tub/shower combo again before D/C.  Therapy Documentation Precautions:  Precautions Precautions: Fall Precaution Comments: Left sided weakness, right gaze preference, left inattention/neglect, left shoulder subluxation, Restrictions Weight Bearing Restrictions: No Pain: Pain Assessment Pain Assessment: No/denies pain  See FIM for current functional status  Therapy/Group: Individual Therapy  Limmie Patricia, OTR/L 04/13/2012, 11:00 AM

## 2012-04-13 NOTE — Progress Notes (Signed)
Occupational Therapy Session Note  Patient Details  Name: Shaun Pugh MRN: [id number] Date of Birth: 1951/05/11  Today's Date: 04/13/2012 Time: 4098-1191 Time Calculation (min): 29 min  Skilled Therapeutic Interventions/Progress Updates:    Went to OT gym and worked on Chief of Staff for the LUE and trunk.  Pt with increased head turn and visual attention to the left.  Attempted to have pt push tilted stool with the LUE and hold while maintaining visual attention.  He was able to perform for short intervals demonstrating LUE activation but was unable to maintain visual attention for more than a few seconds.  Also worked on closed chain activation of the LUE having pt attempt to push himself up from the left side using just the LUE.  While attempting this therapist kept pt's head in some left lateral flexion to help decrease use of his right side to transition to sitting.  Finished by working on cervical stretching in left rotation and left lateral flexion.  Pt keeps head slightly tilted to the right and rotated to the right.  Increased tightness noted in the left upper trap and levator.  Therapy Documentation Precautions:  Precautions Precautions: Fall Precaution Comments: Left sided weakness, right gaze preference, left inattention/neglect, left shoulder subluxation, Restrictions Weight Bearing Restrictions: No  Pain: Pain Assessment Pain Assessment: No/denies pain  See FIM for current functional status  Therapy/Group: Individual Therapy  Kalley Nicholl OTR/L 04/13/2012, 4:11 PM

## 2012-04-13 NOTE — Progress Notes (Signed)
Patient ID: ARN MCOMBER, male   DOB: 1950-08-12, 61 y.o.   MRN: [id number] ASHELY JOSHUA is a 61 y.o. RH-male with history of CAD, TIAs with left visual field deficits as well as dizziness/HA. Admitted on 03/17/12 with HA, left sided weakness and numbness with slurred speech. CT head negative and treated with tPA. CTA head/neck with hypodensity right superior gyrus, ulcerated plaque right ICA with 60% ICA stenosis, suspicion for 10mm focal thrombus proximal aortic arch. Patient developed combativeness with agitation as well as focal motor seizures LUE on 03/18/12. He was treated with ativan and started on Keppra. F/u CCT with extensive area of low density affecting cortical and subcortical right hemisphere in MCA distribution. 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 Placed on ASA and no anticoagulation due to large stroke and risk of bleeding. EEG without epileptiform activity. He did develop fevers and was started on antibiotics for LLL infiltrate. Carotid dopplers done revealing 60-79% ICA stenosis. 2D echo with EF 65-70%   Subjective/Complaints: No void in 8 hours , discussed ICP vs foley for home with uro f/u  Review of Systems  Constitutional: Positive for malaise/fatigue.  Respiratory: Negative for cough.   Cardiovascular: Negative for chest pain.  Neurological: Positive for sensory change, speech change and focal weakness. Negative for seizures.  All other systems reviewed and are negative.    Objective: Vital Signs: Blood pressure 124/78, pulse 79, temperature 98.2 F (36.8 C), temperature source Oral, resp. rate 19, weight 101.7 kg (224 lb 3.3 oz), SpO2 95.00%. No results found. Results for orders placed during the hospital encounter of 03/23/12 (from the past 72 hour(s))  CREATININE, SERUM     Status: Abnormal   Collection Time   04/13/12  6:35 AM      Component Value Range Comment   Creatinine, Ser 1.04  0.50 - 1.35 mg/dL     GFR calc non Af Amer 75 (*) >90 mL/min    GFR calc Af Amer 87 (*) >90 mL/min      Nursing note and vitals reviewed.  Constitutional: He appears well-developed and well-nourished.  HENT:  Head: Normocephalic and atraumatic.  Eyes: Pupils are equal, round, and reactive to light.  Neck: Normal range of motion. Neck supple.  Cardiovascular: Normal rate and regular rhythm.  Pulmonary/Chest: Effort normal and breath sounds normal.  Abdominal: Soft. Bowel sounds are normal.  Musculoskeletal: He exhibits no edema.  Neurological: He is alert.  Oriented to self and situation, place, reason... Able to follow basic commands. Dense left hemiparesis.0/5 LUE except1/5 L finger flexors 1/5 L triceps  3-/5 L HE/KE and  ankle PF , R side 5/5. Mild left inattention. Skin: Skin is warm and dry.  1/2 FB sublux on L,   Assessment/Plan: 1. Functional deficits secondary to Large R MCA infarct with L HP, L hemisensory def, cognitive deficits which require 3+ hours per day of interdisciplinary therapy in a comprehensive inpatient rehab setting. Physiatrist is providing close team supervision and 24 hour management of active medical problems listed below. Physiatrist and rehab team continue to assess barriers to discharge/monitor patient progress toward functional and medical goals. FIM: FIM - Bathing Bathing Steps Patient Completed: Chest;Left Arm;Abdomen;Front perineal area;Right upper leg;Left upper leg Bathing: 3: Mod-Patient completes 5-7 13f 10 parts or 50-74%  FIM - Upper Body Dressing/Undressing Upper body dressing/undressing steps patient completed: Thread/unthread right sleeve of pullover shirt/dresss;Thread/unthread left sleeve of pullover shirt/dress;Put head through opening of pull over shirt/dress;Pull shirt  over trunk Upper body dressing/undressing: 4: Min-Patient completed 75 plus % of tasks FIM - Lower Body Dressing/Undressing Lower body dressing/undressing steps patient completed:  Thread/unthread right pants leg;Thread/unthread left pants leg;Pull pants up/down;Don/Doff right sock;Don/Doff left sock;Don/Doff right shoe;Don/Doff left shoe;Fasten/unfasten right shoe;Fasten/unfasten left shoe Lower body dressing/undressing: 2: Max-Patient completed 25-49% of tasks  FIM - Toileting Toileting steps completed by patient: Adjust clothing prior to toileting;Performs perineal hygiene;Adjust clothing after toileting Toileting Assistive Devices: Grab bar or rail for support Toileting: 4: Steadying assist  FIM - Diplomatic Services operational officer Devices: Grab bars Toilet Transfers: 5-To toilet/BSC: Supervision (verbal cues/safety issues);5-From toilet/BSC: Supervision (verbal cues/safety issues)  FIM - Press photographer Assistive Devices: Bed rails Bed/Chair Transfer: 6: Supine > Sit: No assist;6: Sit > Supine: No assist;4: Bed > Chair or W/C: Min A (steadying Pt. > 75%);4: Chair or W/C > Bed: Min A (steadying Pt. > 75%)  FIM - Locomotion: Wheelchair Distance: 120 Locomotion: Wheelchair: 4: Travels 150 ft or more: maneuvers on rugs and over door sillls with minimal assistance (Pt.>75%) FIM - Locomotion: Ambulation Locomotion: Ambulation Assistive Devices: Other (comment) (rail in hallway) Ambulation/Gait Assistance: 3: Mod assist Locomotion: Ambulation: 1: Travels less than 50 ft with moderate assistance (Pt: 50 - 74%)  Comprehension Comprehension Mode: Auditory Comprehension: 4-Understands basic 75 - 89% of the time/requires cueing 10 - 24% of the time  Expression Expression Mode: Verbal Expression: 4-Expresses basic 75 - 89% of the time/requires cueing 10 - 24% of the time. Needs helper to occlude trach/needs to repeat words.  Social Interaction Social Interaction: 4-Interacts appropriately 75 - 89% of the time - Needs redirection for appropriate language or to initiate interaction.  Problem Solving Problem Solving: 4-Solves basic 75 -  89% of the time/requires cueing 10 - 24% of the time  Memory Memory: 4-Recognizes or recalls 75 - 89% of the time/requires cueing 10 - 24% of the time   Medical Problem List and Plan:  1. DVT Prophylaxis/Anticoagulation: Pharmaceutical: Lovenox  2. Pain Management: Was on started on NSAIDS PTA due to left foot pain. Will monitor for symptoms with increase in activity levels. Continue prn tylenol for now.  3. Mood: patient with poor awareness of deficits.. Feels down  appetite improved with lexapro and ritalin 4. Neuropsych: This patient is not capable of making decisions on his/her own behalf.  5. Urinary retention no obvious meds check UA and trial flomax 6. ZOX:[id number]. Off antihypertensives at current time. Will increase hctz to 25mg . Check bmet monday 7. Focal motor seizures: continue keppra 1250 mg bid.  8. Insomnia/Hallucinations:  scheduled trazodone at bedtime may contribute to retention will D/C.  9. Symptomatic R-ICA stenosis: continue ASA and plavix. Neurology indicates for surgery past discharge?   LOS (Days) 21 A FACE TO FACE EVALUATION WAS PERFORMED  Danissa Rundle E 04/13/2012, 7:52 AM

## 2012-04-13 NOTE — Progress Notes (Signed)
Physical Therapy Session Note  Patient Details  Name: Shaun Pugh MRN: [id number] Date of Birth: 09-30-1950  Today's Date: 04/13/2012 Time: 5621-3086 Time Calculation (min): 45 min  Short Term Goals: STGs= LTGs  Skilled Therapeutic Interventions/Progress Updates:  Newman Nickels schedule to eval pt for w/c on Mon, 12/9, 10:30.  Pt and SW informed.  Staff transfer trainingNoreene Larsson, RN performed recliner> bed squat pivot transfer with min/mod assist, with cues from therapist.  W/c propulsion x 110' using hemi technique, min assist for steering due to L neglect.  neuromuscular re-education via forced use, tactile and VCs, visual cues from mirror, for: -wt shifting L<>R focusing on L stance stability -mini-squats in standing -L full LLE during R heel lifts -Gait x 30' forward and backward in hall using R rail, focusing on L stance stability and awareness, max assist for upright trunk, LLE control. -Kinetron in sitting and standing , resistance 40-60, rated 12 on Borg scale, focusing on L wt shift and extension  Kinetron standing> w/c stand pivot transfer to L with min assist without cues for technique    Therapy Documentation Precautions:  Precautions Precautions: Fall Precaution Comments: Left sided weakness, right gaze preference, left inattention/neglect, left shoulder subluxation,  Restrictions Weight Bearing Restrictions: No   Pain: Pain Assessment Pain Assessment: No/denies pain     See FIM for current functional status  Therapy/Group: Individual Therapy  [name] 04/13/2012, 1:31 PM

## 2012-04-13 NOTE — Patient Care Conference (Signed)
Inpatient RehabilitationTeam Conference Note Date: 04/13/2012   Time: 10:57 AM    Patient Name: Shaun Pugh      Medical Record Number: [id number]  Date of Birth: 06-Dec-1950 Sex: Male         Room/Bed: 4028/4028-01 Payor Info: Payor: [employer] [place] [employer]ICARE  Plan: BLUE MEDICARE  Product Type: *No Product type*     Admitting Diagnosis: CVA  Admit Date/Time:  03/23/2012  6:54 PM Admission Comments: No comment available   Primary Diagnosis:  Stroke, acute, embolic Principal Problem: Stroke, acute, embolic  Patient Active Problem List   Diagnosis Date Noted  . Cytotoxic cerebral edema 03/19/2012  . Aortic thromboembolism 03/19/2012  . Stroke, acute, embolic, right 03/18/2012  . Seizure 03/18/2012  . Stented coronary artery   . Hemiplegia, unspecified, affecting nondominant side 03/17/2012  . Atrophy of right kidney 09/15/2011  . Kidney calculi 09/15/2011  . Acute pancreatitis 09/14/2011  . Cholelithiasis 09/14/2011  . Hyperglycemia 09/14/2011  . Current every day smoker 09/14/2011    Expected Discharge Date: Expected Discharge Date: 04/20/12  Team Members Present: Physician: Dr. Claudette Laws Social Worker Present: Dossie Der, LCSW Nurse Present: Rosalio Macadamia, RN PT Present: Wanda Plump, PT;Other (comment) Clarisse Gouge Ripa-PT) OT Present: Leonette Monarch, OT SLP Present: Fae Pippin, SLP Other (Discipline and Name): Charolette Child Coordinator     Current Status/Progress Goal Weekly Team Focus  Medical   urinary retention, attention improved on Ritalin trial  Instruct wife with ICP  recheck UA C and S   Bowel/Bladder   continent of bowel; requires in and out caths due to large volume bladder scans; voids at times   pt to void and have low bladder scans  Remain continent of bowel and bladder   Swallow/Nutrition/ Hydration   regular and thin with full supervision to check for pocketing   least restrictive p.o. intake  continue to increase attention and  initiation as well as carryover of strategies   ADL's   Patient is currently Supervision for grooming; Min Assist for UB dressing; Mod Assist for bathing; Max Assist for LB dressing. Functional transfer: Mod Assist (shower)/Min Assist (toilet). Left side neglect decreasing with vc's.  Min Assist overall  Family education, ADL performance, functional transfers, left awareness    Mobility   min assist basic transfers and bed mobility  min assist to supervision; LTG's changed to remove stair goal and change ambulation to mod assist  continue family education with wife; need to get wheelchair and cushion ordered for discharge   Communication   min-mod assist   supervision-min assist   continue with initiation   Safety/Cognition/ Behavioral Observations  min-mod assist   min assist   increase selective attention and self-monitoring and correcting with functional tasks   Pain   denies pain         Skin   skin intact  remain free of breakdown  reamin free of breakdown      *See Interdisciplinary Assessment and Plan and progress notes for long and short-term goals  Barriers to Discharge: Current home inaccesible    Possible Resolutions to Barriers:  Wife is pursuing new home    Discharge Planning/Teaching Needs:  Family education has begun with wife, been here daily to start learnign pt's care      Team Discussion:  Family education on-going this week.  More energy this week.  W/C eval Monday.  I & O cath see if need to train or d/c with foley  Revisions  to Treatment Plan:  none   Continued Need for Acute Rehabilitation Level of Care: The patient requires daily medical management by a physician with specialized training in physical medicine and rehabilitation for the following conditions: Daily direction of a multidisciplinary physical rehabilitation program to ensure safe treatment while eliciting the highest outcome that is of practical value to the patient.: Yes Daily medical  management of patient stability for increased activity during participation in an intensive rehabilitation regime.: Yes Daily analysis of laboratory values and/or radiology reports with any subsequent need for medication adjustment of medical intervention for : Neurological problems  Lorelle Macaluso, Lemar Livings 04/14/2012, 10:57 AM

## 2012-04-13 NOTE — Progress Notes (Signed)
Occupational Therapy Note  Patient Details  Name: Shaun Pugh MRN: [id number] Date of Birth: 1950-11-17 Today's Date: 04/13/2012  TimeL 7829-5621 (cotx with Speech Therapy - actual time in group 1130-1205) Pt denies pain Group Therapy  Pt engaged in self feeding group with focus on portion control, attention to left environment and LUE, swallowing strategies, and socialization with other participants.  Pt required min verbal cues for LUE positioning on table.  Pt required verbal cues when LUE slid off table beside w/c.     Lavone Neri [facility] 04/13/2012, 1:08 PM

## 2012-04-13 NOTE — Progress Notes (Addendum)
Speech Language Pathology Daily Session Note  Patient Details  Name: Shaun Pugh MRN: [id number] Date of Birth: [birth year]  Today's Date: 04/13/2012 Time: 1130-1145 Time Calculation (min): 15 min  Short Term Goals: Week 3: SLP Short Term Goal 1 (Week 3): Patient will utilize lingual sweep consuming regular textures and thin liquids with modified independence. SLP Short Term Goal 2 (Week 3): Patient will solve basic problems during functional tasks with mod A visual and verbal cueing. SLP Short Term Goal 3 (Week 3): Patient will attend to the left during basic functional tasks with mod A verbal cuing.  SLP Short Term Goal 4 (Week 3): Patient will demonstrate emergent awareness by asking for help when needed with max A verbal cueing.  SLP Short Term Goal 5 (Week 3): Patient will alternate attention between the clinician and a basic functional task with mod A verbal and visual cues for redirection. SLP Short Term Goal 6 (Week 3): Patient will utilize external aids to facilitate recall of new, daily information with max A multimodal cueing.   Skilled Therapeutic Interventions: Co-treatment, group session; SLP focused on addressing dysphagia and cognition goals.  Patient consumed regular textures and thin liquids with supervision verbal cues to initiate self-feeding and mod assist verbal cues to attend to left upper extremity as well as left of environment.   SLP provided patient with increased wait time to utilize tongue sweep to manage left sided oral pocketing. Recommend change in supervision to intermittent level.  FIM:  Comprehension Comprehension Mode: Auditory Comprehension: 4-Understands basic 75 - 89% of the time/requires cueing 10 - 24% of the time Expression Expression Mode: Verbal Expression: 4-Expresses basic 75 - 89% of the time/requires cueing 10 - 24% of the time. Needs helper to occlude trach/needs to repeat words. Social Interaction Social Interaction: 4-Interacts  appropriately 75 - 89% of the time - Needs redirection for appropriate language or to initiate interaction. Problem Solving Problem Solving: 4-Solves basic 75 - 89% of the time/requires cueing 10 - 24% of the time Memory Memory: 4-Recognizes or recalls 75 - 89% of the time/requires cueing 10 - 24% of the time FIM - Eating Eating Activity: 5: Set-up assist for open containers  Pain Pain Assessment Pain Assessment: No/denies pain  Therapy/Group: Group Therapy  Charlane Ferretti., CCC-SLP [phone]  [name] 04/13/2012, 12:57 PM

## 2012-04-14 ENCOUNTER — Inpatient Hospital Stay (HOSPITAL_COMMUNITY): Payer: Medicare Other | Admitting: Speech Pathology

## 2012-04-14 ENCOUNTER — Inpatient Hospital Stay (HOSPITAL_COMMUNITY): Payer: Medicare Other

## 2012-04-14 MED ORDER — SORBITOL 70 % SOLN
60.0000 mL | Freq: Once | Status: AC
  Administered 2012-04-14: 60 mL via ORAL
  Filled 2012-04-14: qty 60

## 2012-04-14 NOTE — Progress Notes (Signed)
Physical Therapy Session Note  Patient Details  Name: Shaun Pugh MRN: [id number] Date of Birth: Mar 07, 1951  Today's Date: 04/14/2012 Time: 1015-1058 Time Calculation (min): 43 min  Short Term Goals: Week 4 STGs = LTGs  Skilled Therapeutic Interventions/Progress Updates:  PROM and AROM L hamstrings and heel cords, in sitting.  Instructed pt in using a strap to pull foot into DF while sitting, L foot supported and knee extended.  W/c propulsion on tile and carpet x 150' using hemi technique, VCS and occasional min assist for steering, L inattention.  neuromuscular re-education via forced use, manual cues, VCS, for: - gait in parallel bars, working on full LLE extension before stepping with RLE, max assist -gait with grocery cart, L hand secured with ACE wrap, R hand on vertical bar to promote back extension, x 14' +2 assist for control of cart, wt shifting , L foot placement -locking/unlocking w.c brake on L , using L hand with assist -Kinetron in standing, at resistance 40 cm/second, focusing on full LLE extension before wt shift to R  Pt continues to have significant L neglect and impulsivity during gait, which limit his progress.    Therapy Documentation Precautions:  Precautions Precautions: Fall Precaution Comments: Left sided weakness, right gaze preference, left inattention/neglect, left shoulder subluxation Restrictions Weight Bearing Restrictions: No   Pain: Pain Assessment Pain Assessment: No/denies pain   Locomotion : Ambulation Ambulation/Gait Assistance: 1: +2 Total assist      See FIM for current functional status  Therapy/Group: Individual Therapy  Analya Louissaint 04/14/2012, 12:37 PM

## 2012-04-14 NOTE — Progress Notes (Addendum)
Speech Language Pathology Daily Session Note  Patient Details  Name: BERNIS SCHREUR MRN: [id number] Date of Birth: Mar 03, 1951  Today's Date: 04/14/2012 Time: 1130-1155 Time Calculation (min): 25 min  Short Term Goals: Week 3: SLP Short Term Goal 1 (Week 3): Patient will utilize lingual sweep consuming regular textures and thin liquids with modified independence. SLP Short Term Goal 2 (Week 3): Patient will solve basic problems during functional tasks with mod A visual and verbal cueing. SLP Short Term Goal 3 (Week 3): Patient will attend to the left during basic functional tasks with mod A verbal cuing.  SLP Short Term Goal 4 (Week 3): Patient will demonstrate emergent awareness by asking for help when needed with max A verbal cueing.  SLP Short Term Goal 5 (Week 3): Patient will alternate attention between the clinician and a basic functional task with mod A verbal and visual cues for redirection. SLP Short Term Goal 6 (Week 3): Patient will utilize external aids to facilitate recall of new, daily information with max A multimodal cueing.   Skilled Therapeutic Interventions: Co-treatment, group session; SLP focused on addressing dysphagia and cognition goals. Patient consumed regular textures and thin liquids with supervision verbal cues to initiate set up and min assist verbal cues to attend to left upper extremity as well as left of environment. SLP provided patient with increased wait time to utilize tongue sweep to manage left sided oral pocketing.   FIM:  FIM - Eating Eating Activity: 5: Set-up assist for open containers  Pain Pain Assessment Pain Assessment: No/denies pain  Therapy/Group: Group Therapy  Charlane Ferretti., CCC-SLP [phone]  Katonya Blecher 04/14/2012, 1:11 PM

## 2012-04-14 NOTE — Progress Notes (Signed)
Occupational Therapy Note  Patient Details  Name: Shaun Pugh MRN: [id number] Date of Birth: 11-20-1950 Today's Date: 04/14/2012  Ti me: 4098-1191 (cotx with Speech Therapy - actual group time 1130-1210) Pt denies pain Group Therapy Pt participated in self feeding group with focus on attention to LUE/environment, swallowing strategies, and utensil/food management.  Pt required assistance with cutting food to more manageable size.  Pt required verbal cues for positioning of LUE during meal.   Rich Brave 04/14/2012, 4:01 PM

## 2012-04-14 NOTE — Progress Notes (Signed)
Social Work Patient ID: Shaun Pugh, male   DOB: [birth year], 61 y.o.   MRN: [id number] Met with pt and wife to inform of team conference and progression toward goals.  Wife has been here this week learning pt's care and feels more Comfortable with his transfers and self care needs.  She will continue to participate until discharge.  Will begin with home health therapies and transition To OP once endurance is better.  Both agree with team's recommendations.  Wheelchair eval on Monday at 10:30 wife will be here.

## 2012-04-14 NOTE — Progress Notes (Signed)
Occupational Therapy Session Note  Patient Details  Name: Shaun Pugh MRN: [id number] Date of Birth: [birth year]  Today's Date: 04/14/2012 Time: 0830-0930 Time Calculation (min): 60 min  Short Term Goals: Week 2:  OT Short Term Goal 1 (Week 2): Patient will perform dressing with Max Assist. OT Short Term Goal 2 (Week 2): Patient will increase sitting balance from Poor+ to Fair- to increase functional performance during bathing and dressing. OT Short Term Goal 3 (Week 2): Patient will increase toilet transfer to Max Assist. OT Short Term Goal 4 (Week 2): Patient will initiate dressing and bathing tasks without vc's.  Skilled Therapeutic Interventions/Progress Updates:    ADL re-training completed in walk-in shower. Swapped out rolling shower chair for tub transfer bench. Patient stood at grab bar and side stepped into shower. Left hand became stuck under shower handle and patient received a skin tear on dorsal side of hand. Nursing aware and applied tegraderm. Patient used long handled sponge to increase functional performance during LB bathing. Boxer briefs were donned this AM instead of brief to work on bladder and bowel management. Patient still experiencing difficulty with donning long sleeved shirt. Still requires max vc's for technique and motivation and increased time.  Therapy Documentation Precautions:  Precautions Precautions: Fall Precaution Comments: Left sided weakness, right gaze preference, left inattention/neglect, left shoulder subluxation Restrictions Weight Bearing Restrictions: No Pain: Pain Assessment Pain Assessment: No/denies pain  See FIM for current functional status  Therapy/Group: Individual Therapy   Limmie Patricia, OTR/L 04/14/2012, 11:37 AM

## 2012-04-14 NOTE — Progress Notes (Signed)
Patient ID: Shaun Pugh, male   DOB: 04-27-1951, 61 y.o.   MRN: [id number] Shaun Pugh is a 61 y.o. RH-male with history of CAD, TIAs with left visual field deficits as well as dizziness/HA. Admitted on 03/17/12 with HA, left sided weakness and numbness with slurred speech. CT head negative and treated with tPA. CTA head/neck with hypodensity right superior gyrus, ulcerated plaque right ICA with 60% ICA stenosis, suspicion for 10mm focal thrombus proximal aortic arch. Patient developed combativeness with agitation as well as focal motor seizures LUE on 03/18/12. He was treated with ativan and started on Keppra. F/u CCT with extensive area of low density affecting cortical and subcortical right hemisphere in MCA distribution. 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 Placed on ASA and no anticoagulation due to large stroke and risk of bleeding. EEG without epileptiform activity. He did develop fevers and was started on antibiotics for LLL infiltrate. Carotid dopplers done revealing 60-79% ICA stenosis. 2D echo with EF 65-70%   Subjective/Complaints: Voided twice yesterday and cathed this am discussed ICP  for home with uro f/u  Review of Systems  Constitutional: Positive for malaise/fatigue.  Respiratory: Negative for cough.   Cardiovascular: Negative for chest pain.  Neurological: Positive for sensory change, speech change and focal weakness. Negative for seizures.  All other systems reviewed and are negative.    Objective: Vital Signs: Blood pressure 111/70, pulse 75, temperature 98 F (36.7 C), temperature source Oral, resp. rate 17, weight 106.3 kg (234 lb 5.6 oz), SpO2 98.00%. No results found. Results for orders placed during the hospital encounter of 03/23/12 (from the past 72 hour(s))  CREATININE, SERUM     Status: Abnormal   Collection Time   04/13/12  6:35 AM      Component Value Range Comment   Creatinine, Ser 1.04  0.50 -  1.35 mg/dL    GFR calc non Af Amer 75 (*) >90 mL/min    GFR calc Af Amer 87 (*) >90 mL/min      Nursing note and vitals reviewed.  Constitutional: He appears well-developed and well-nourished.  HENT:  Head: Normocephalic and atraumatic.  Eyes: Pupils are equal, round, and reactive to light.  Neck: Normal range of motion. Neck supple.  Cardiovascular: Normal rate and regular rhythm.  Pulmonary/Chest: Effort normal and breath sounds normal.  Abdominal: Soft. Bowel sounds are normal.  Musculoskeletal: He exhibits no edema.  Neurological: He is alert.  Oriented to self and situation, place, reason... Able to follow basic commands. Dense left hemiparesis.0/5 LUE except1/5 L finger flexors 1/5 L triceps  3-/5 L HE/KE and  ankle PF , R side 5/5. Mild left inattention. Skin: Skin is warm and dry.  1/2 FB sublux on L,   Assessment/Plan: 1. Functional deficits secondary to Large R MCA infarct with L HP, L hemisensory def, cognitive deficits which require 3+ hours per day of interdisciplinary therapy in a comprehensive inpatient rehab setting. Physiatrist is providing close team supervision and 24 hour management of active medical problems listed below. Physiatrist and rehab team continue to assess barriers to discharge/monitor patient progress toward functional and medical goals. FIM: FIM - Bathing Bathing Steps Patient Completed: Chest;Left Arm;Abdomen;Front perineal area;Right upper leg;Left upper leg Bathing: 3: Mod-Patient completes 5-7 46f 10 parts or 50-74%  FIM - Upper Body Dressing/Undressing Upper body dressing/undressing steps patient completed: Thread/unthread right sleeve of pullover shirt/dresss;Thread/unthread left sleeve of pullover shirt/dress;Put head through opening of pull over shirt/dress;Pull shirt  over trunk Upper body dressing/undressing: 4: Min-Patient completed 75 plus % of tasks FIM - Lower Body Dressing/Undressing Lower body dressing/undressing steps patient completed:  Thread/unthread right pants leg;Thread/unthread left pants leg;Pull pants up/down;Don/Doff right sock;Don/Doff left sock;Don/Doff right shoe;Don/Doff left shoe;Fasten/unfasten right shoe;Fasten/unfasten left shoe Lower body dressing/undressing: 2: Max-Patient completed 25-49% of tasks  FIM - Toileting Toileting steps completed by patient: Adjust clothing prior to toileting;Performs perineal hygiene;Adjust clothing after toileting Toileting Assistive Devices: Grab bar or rail for support Toileting: 4: Steadying assist  FIM - Diplomatic Services operational officer Devices: Grab bars Toilet Transfers: 5-To toilet/BSC: Supervision (verbal cues/safety issues);5-From toilet/BSC: Supervision (verbal cues/safety issues)  FIM - Banker Devices: Bed rails Bed/Chair Transfer: 0: Activity did not occur  FIM - Locomotion: Wheelchair Distance: 120 Locomotion: Wheelchair: 0: Activity did not occur FIM - Locomotion: Ambulation Locomotion: Ambulation Assistive Devices: Other (comment) Ambulation/Gait Assistance: 2: Max assist Locomotion: Ambulation: 1: Travels less than 50 ft with maximal assistance (Pt: 25 - 49%)  Comprehension Comprehension Mode: Auditory Comprehension: 4-Understands basic 75 - 89% of the time/requires cueing 10 - 24% of the time  Expression Expression Mode: Verbal Expression: 4-Expresses basic 75 - 89% of the time/requires cueing 10 - 24% of the time. Needs helper to occlude trach/needs to repeat words.  Social Interaction Social Interaction: 4-Interacts appropriately 75 - 89% of the time - Needs redirection for appropriate language or to initiate interaction.  Problem Solving Problem Solving: 4-Solves basic 75 - 89% of the time/requires cueing 10 - 24% of the time  Memory Memory: 4-Recognizes or recalls 75 - 89% of the time/requires cueing 10 - 24% of the time   Medical Problem List and Plan:  1. DVT  Prophylaxis/Anticoagulation: Pharmaceutical: Lovenox  2. Pain Management: Was on started on NSAIDS PTA due to left foot pain. Will monitor for symptoms with increase in activity levels. Continue prn tylenol for now.  3. Mood: patient with poor awareness of deficits.. Feels down  appetite improved with lexapro and ritalin 4. Neuropsych: This patient is not capable of making decisions on his/her own behalf.  5. Urinary retention no obvious meds check UA and trial flomax 6. ZOX:[id number]. Off antihypertensives at current time. Will increase hctz to 25mg . Check bmet monday 7. Focal motor seizures: no recurrence continue keppra 1250 mg bid.  8. Insomnia/Hallucinations:  scheduled trazodone at bedtime may contribute to retention will D/C.  9. Symptomatic R-ICA stenosis: continue ASA and plavix. Neurology indicates for surgery past discharge?   LOS (Days) 22 A FACE TO FACE EVALUATION WAS PERFORMED  Shaun Pugh 04/14/2012, 7:48 AM

## 2012-04-14 NOTE — Progress Notes (Signed)
Speech Language Pathology Daily Session Note & Weekly Progress Note  Patient Details  Name: Shaun Pugh MRN: [id number] Date of Birth: [birth year]  Today's Date: 04/14/2012 Time: 1515-1610 Time Calculation (min): 55 min  Short Term Goals: Week 3: SLP Short Term Goal 1 (Week 3): Patient will utilize lingual sweep consuming regular textures and thin liquids with modified independence. SLP Short Term Goal 2 (Week 3): Patient will solve basic problems during functional tasks with mod A visual and verbal cueing. SLP Short Term Goal 3 (Week 3): Patient will attend to the left during basic functional tasks with mod A verbal cuing.  SLP Short Term Goal 4 (Week 3): Patient will demonstrate emergent awareness by asking for help when needed with max A verbal cueing.  SLP Short Term Goal 5 (Week 3): Patient will alternate attention between the clinician and a basic functional task with mod A verbal and visual cues for redirection. SLP Short Term Goal 6 (Week 3): Patient will utilize external aids to facilitate recall of new, daily information with max A multimodal cueing.   Skilled Therapeutic Interventions: Skilled treatment session focused on addressing cognition goals duirng various activities.  SLP faciliated session with mod assist verbal and vsual cues to attend to left of environment duirng funcitonal wheel chair tasks and talking with therapist.  SLP also faciliated session with mod assist verbal and visual cues to complete moderately complex daily calculations for time and money tasks.  Patient is requiring less cues for recall of information as well as increased intellectual awareness; however, emergent and anticipatory awarenss are still significantly impaired.    FIM:  Comprehension Comprehension Mode: Auditory Comprehension: 4-Understands basic 75 - 89% of the time/requires cueing 10 - 24% of the time Expression Expression Mode: Verbal Expression: 5-Expresses basic 90% of the time/requires  cueing < 10% of the time. Social Interaction Social Interaction: 6-Interacts appropriately with others with medication or extra time (anti-anxiety, antidepressant). Problem Solving Problem Solving: 3-Solves basic 50 - 74% of the time/requires cueing 25 - 49% of the time Memory Memory: 4-Recognizes or recalls 75 - 89% of the time/requires cueing 10 - 24% of the time FIM - Eating Eating Activity: 5: Supervision/cues;5: Set-up assist for open containers  Pain Pain Assessment Pain Assessment: No/denies pain  Therapy/Group: Individual Therapy   Speech Language Pathology Weekly Progress Note  Patient Details  Name: Shaun Pugh MRN: [id number] Date of Birth: May 17, 1950  Today's Date: 04/14/2012  Short Term Goals: Week 3: SLP Short Term Goal 1 (Week 3): Patient will utilize lingual sweep consuming regular textures and thin liquids with modified independence. SLP Short Term Goal 1 - Progress (Week 3): Met SLP Short Term Goal 2 (Week 3): Patient will solve basic problems during functional tasks with mod A visual and verbal cueing. SLP Short Term Goal 2 - Progress (Week 3): Met SLP Short Term Goal 3 (Week 3): Patient will attend to the left during basic functional tasks with mod A verbal cuing.  SLP Short Term Goal 3 - Progress (Week 3): Met SLP Short Term Goal 4 (Week 3): Patient will demonstrate emergent awareness by asking for help when needed with max A verbal cueing.  SLP Short Term Goal 4 - Progress (Week 3): Met SLP Short Term Goal 5 (Week 3): Patient will alternate attention between the clinician and a basic functional task with mod A verbal and visual cues for redirection. SLP Short Term Goal 5 - Progress (Week 3): Met SLP Short Term Goal 6 (Week 3): Patient  will utilize external aids to facilitate recall of new, daily information with max A multimodal cueing.  SLP Short Term Goal 6 - Progress (Week 3): Met Week 4: SLP Short Term Goal 1 (Week 4): Patient will solve basic problems  during functional tasks with min assist visual and verbal cueing. SLP Short Term Goal 2 (Week 4): Patient will attend to the left during basic functional tasks with min assist verbal cuing. SLP Short Term Goal 3 (Week 4): Patient will demonstrate emergent awareness by asking for help when needed with mod assist verbal cueing. SLP Short Term Goal 4 (Week 4): Patient will alternate attention between the clinician and a basic functional task with min assist verbal and visual cues for redirection. SLP Short Term Goal 5 (Week 4): Patient will utilize external aids to facilitate recall of new, daily information with min assist multimodal cueing.  Weekly Progress Updates: Patient met 6 out of 6 short term objectives this reporting period with greatest gains in the area of swllow function; currently modified independent with use of compensatory straegies.  All other cognitive goals patient also met with consisent need for moderate cues with recall, left attention, basic poblem solving and requires max assist for emergent awareness of deficits in the moment.  As a resutl, it is recommended that this patient continue to recieve skilled SLP services to address cognition, futher reduce burden of care upon discharge and complete family education.    SLP Frequency: 1-2 X/day, 30-60 minutes;5 out of 7 days Estimated Length of Stay: anticipated discharge 04/20/12 SLP Treatment/Interventions: Cognitive remediation/compensation;Cueing hierarchy;Environmental controls;Functional tasks;Internal/external aids;Medication managment;Patient/family education;Therapeutic Activities  Shaun Pugh., CCC-SLP [phone]  Shaun Pugh 04/14/2012, 5:05 PM

## 2012-04-15 ENCOUNTER — Inpatient Hospital Stay (HOSPITAL_COMMUNITY): Payer: Medicare Other | Admitting: Speech Pathology

## 2012-04-15 ENCOUNTER — Inpatient Hospital Stay (HOSPITAL_COMMUNITY): Payer: Medicare Other

## 2012-04-15 MED ORDER — MAGNESIUM CITRATE PO SOLN
1.0000 | Freq: Once | ORAL | Status: AC
  Administered 2012-04-15: 1 via ORAL
  Filled 2012-04-15: qty 296

## 2012-04-15 MED ORDER — BETHANECHOL CHLORIDE 25 MG PO TABS
25.0000 mg | ORAL_TABLET | Freq: Three times a day (TID) | ORAL | Status: DC
  Administered 2012-04-15 – 2012-04-20 (×16): 25 mg via ORAL
  Filled 2012-04-15 (×20): qty 1

## 2012-04-15 NOTE — Progress Notes (Signed)
Patient ID: Shaun Pugh, male   DOB: 1950/07/24, 61 y.o.   MRN: [id number] Shaun Pugh is a 61 y.o. RH-male with history of CAD, TIAs with left visual field deficits as well as dizziness/HA. Admitted on 03/17/12 with HA, left sided weakness and numbness with slurred speech. CT head negative and treated with tPA. CTA head/neck with hypodensity right superior gyrus, ulcerated plaque right ICA with 60% ICA stenosis, suspicion for 10mm focal thrombus proximal aortic arch. Patient developed combativeness with agitation as well as focal motor seizures LUE on 03/18/12. He was treated with ativan and started on Keppra. F/u CCT with extensive area of low density affecting cortical and subcortical right hemisphere in MCA distribution. 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 Placed on ASA and no anticoagulation due to large stroke and risk of bleeding. EEG without epileptiform activity. He did develop fevers and was started on antibiotics for LLL infiltrate. Carotid dopplers done revealing 60-79% ICA stenosis. 2D echo with EF 65-70%   Subjective/Complaints: Constipated and  cathed this am  Review of Systems  Constitutional: Positive for malaise/fatigue.  Respiratory: Negative for cough.   Cardiovascular: Negative for chest pain.  Neurological: Positive for sensory change, speech change and focal weakness. Negative for seizures.  All other systems reviewed and are negative.    Objective: Vital Signs: Blood pressure 121/78, pulse 80, temperature 97.6 F (36.4 C), temperature source Oral, resp. rate 18, weight 106.3 kg (234 lb 5.6 oz), SpO2 97.00%. No results found. Results for orders placed during the hospital encounter of 03/23/12 (from the past 72 hour(s))  CREATININE, SERUM     Status: Abnormal   Collection Time   04/13/12  6:35 AM      Component Value Range Comment   Creatinine, Ser 1.04  0.50 - 1.35 mg/dL    GFR calc non Af Amer 75  (*) >90 mL/min    GFR calc Af Amer 87 (*) >90 mL/min      Nursing note and vitals reviewed.  Constitutional: He appears well-developed and well-nourished.  HENT:  Head: Normocephalic and atraumatic.  Eyes: Pupils are equal, round, and reactive to light.  Neck: Normal range of motion. Neck supple.  Cardiovascular: Normal rate and regular rhythm.  Pulmonary/Chest: Effort normal and breath sounds normal.  Abdominal: Soft. Bowel sounds are normal.  Musculoskeletal: He exhibits no edema.  Neurological: He is alert.  Oriented to self and situation, place, reason... Able to follow basic commands. Dense left hemiparesis.0/5 LUE except1/5 L finger flexors 1/5 L triceps  3-/5 L HE/KE and  ankle PF , R side 5/5. Mild left inattention. Skin: Skin is warm and dry.  1/2 FB sublux on L,   Assessment/Plan: 1. Functional deficits secondary to Large R MCA infarct with L HP, L hemisensory def, cognitive deficits which require 3+ hours per day of interdisciplinary therapy in a comprehensive inpatient rehab setting. Physiatrist is providing close team supervision and 24 hour management of active medical problems listed below. Physiatrist and rehab team continue to assess barriers to discharge/monitor patient progress toward functional and medical goals. FIM: FIM - Bathing Bathing Steps Patient Completed: Chest;Left Arm;Abdomen;Front perineal area;Buttocks;Right upper leg;Left upper leg;Right lower leg (including foot);Left lower leg (including foot) Bathing: 4: Min-Patient completes 8-9 18f 10 parts or 75+ percent  FIM - Upper Body Dressing/Undressing Upper body dressing/undressing steps patient completed: Thread/unthread right sleeve of pullover shirt/dresss;Thread/unthread left sleeve of pullover shirt/dress;Put head through opening of pull over shirt/dress;Pull  shirt over trunk Upper body dressing/undressing: 4: Min-Patient completed 75 plus % of tasks FIM - Lower Body Dressing/Undressing Lower body  dressing/undressing steps patient completed: Thread/unthread right pants leg;Thread/unthread left pants leg;Pull pants up/down Lower body dressing/undressing: 2: Max-Patient completed 25-49% of tasks  FIM - Toileting Toileting steps completed by patient: Adjust clothing prior to toileting;Performs perineal hygiene;Adjust clothing after toileting Toileting Assistive Devices: Grab bar or rail for support Toileting: 0: Activity did not occur  FIM - Diplomatic Services operational officer Devices: Grab bars Toilet Transfers: 0-Activity did not occur  FIM - Banker Devices: Bed rails Bed/Chair Transfer:  (wife performed)  FIM - Locomotion: Wheelchair Distance: 120 Locomotion: Wheelchair: 2: Travels 50 - 149 ft with minimal assistance (Pt.>75%) FIM - Locomotion: Ambulation Locomotion: Ambulation Assistive Devices: Other (comment) (grocery cart) Ambulation/Gait Assistance: 1: +2 Total assist Locomotion: Ambulation: 1: Two helpers  Comprehension Comprehension Mode: Auditory Comprehension: 4-Understands basic 75 - 89% of the time/requires cueing 10 - 24% of the time  Expression Expression Mode: Verbal Expression: 5-Expresses basic 90% of the time/requires cueing < 10% of the time.  Social Interaction Social Interaction: 6-Interacts appropriately with others with medication or extra time (anti-anxiety, antidepressant).  Problem Solving Problem Solving: 3-Solves basic 50 - 74% of the time/requires cueing 25 - 49% of the time  Memory Memory: 4-Recognizes or recalls 75 - 89% of the time/requires cueing 10 - 24% of the time   Medical Problem List and Plan:  1. DVT Prophylaxis/Anticoagulation: Pharmaceutical: Lovenox  2. Pain Management: Was on started on NSAIDS PTA due to left foot pain. Will monitor for symptoms with increase in activity levels. Continue prn tylenol for now.  3. Mood: patient with poor awareness of deficits.. Feels down   appetite improved with lexapro and ritalin 4. Neuropsych: This patient is not capable of making decisions on his/her own behalf.  5. Urinary retention no obvious meds check UA and trial flomax 6. ZOX:[id number]. Off antihypertensives at current time. Will increase hctz to 25mg . Check bmet monday 7. Focal motor seizures: no recurrence continue keppra 1250 mg bid.  8. Insomnia/Hallucinations:  scheduled trazodone at bedtime may contribute to retention will D/C.  9. Symptomatic R-ICA stenosis: continue ASA and plavix. Neurology indicates for surgery past discharge?   LOS (Days) 23 A FACE TO FACE EVALUATION WAS PERFORMED  KIRSTEINS,ANDREW E 04/15/2012, 7:42 AM

## 2012-04-15 NOTE — Progress Notes (Signed)
Occupational Therapy Weekly Progress Note  Patient Details  Name: Shaun Pugh MRN: [id number] Date of Birth: May 08, 1951  Today's Date: 04/15/2012 Time: 0830-0930 Time Calculation (min): 60 min  Patient has met 3 of 4 short term goals.    Patient continues to demonstrate the following deficits: left side neglect, decreased ADL performance, standing balance, impaired task initiation and therefore will continue to benefit from skilled OT intervention to enhance overall performance with BADL.  Patient progressing toward long term goals..  Continue plan of care.  OT Short Term Goals Week 2:  OT Short Term Goal 1 (Week 2): Patient will perform dressing with Max Assist. OT Short Term Goal 1 - Progress (Week 2): Met OT Short Term Goal 2 (Week 2): Patient will increase sitting balance from Poor+ to Fair- to increase functional performance during bathing and dressing. OT Short Term Goal 2 - Progress (Week 2): Met OT Short Term Goal 3 (Week 2): Patient will increase toilet transfer to Max Assist. OT Short Term Goal 3 - Progress (Week 2): Met OT Short Term Goal 4 (Week 2): Patient will initiate dressing and bathing tasks without vc's. OT Short Term Goal 4 - Progress (Week 2): Progressing toward goal Week 3:  OT Short Term Goal 1 (Week 3): STGs = LTGs  Skilled Therapeutic Interventions/Progress Updates:    ADl re-training completed in walk-in shower. Session with focus on ADL performance, functional transfers, carry over of hemi dressing techniques, standing balance, task initiation and direction following. Patient used grab bar in shower to stand and side step into shower with vc's for technique. Patient bathed buttocks by leaning to the left. Dressing performed at sink. Patient still requires constant vc's for hemi-dressing techniques. Patient continuously wants to put right arm and right leg in clothing even when therapist educates patient to secure left arm and leg in garments prior to right arm  and leg. Patient donned socks using forward chaining method as therapist put socks over toes and patient pulled sock on completely. Patient donned t-shirt this AM which was less difficult but still required vc's for technique.   Therapy Documentation Precautions:  Precautions Precautions: Fall Precaution Comments: Left sided weakness, right gaze preference, left inattention/neglect, left shoulder subluxation Restrictions Weight Bearing Restrictions: No Pain: Pain Assessment Pain Assessment: No/denies pain  See FIM for current functional status  Therapy/Group: Individual Therapy  Limmie Patricia, OTR/L 04/15/2012, 10:58 AM

## 2012-04-15 NOTE — Progress Notes (Signed)
Physical Therapy Session Note  Patient Details  Name: Shaun Pugh MRN: [id number] Date of Birth: [birth year]  Today's Date: 04/15/2012 Time: 1000-1055 Time Calculation (min): 55 min  Short Term Goals: Week 3:  PT Short Term Goal 1 (Week 3): Patient will move supine to and from sit with minimal steadying assist and verbal cueing PT Short Term Goal 1 - Progress (Week 3): Met PT Short Term Goal 2 (Week 3): Patient will transfer to right and left with minimal assistance. PT Short Term Goal 2 - Progress (Week 3): Met PT Short Term Goal 3 (Week 3): Patient will propel wheelchair 150 feet with supervision and minimal verbal cueing PT Short Term Goal 3 - Progress (Week 3): Met PT Short Term Goal 4 (Week 3): Patient will stand 5 minutes with +1 max assist with UE support PT Short Term Goal 4 - Progress (Week 3): Met PT Short Term Goal 5 (Week 3): Patient will ambulate 25 feet with +1 max assist and least restrictive assistive device. PT Short Term Goal 5 - Progress (Week 3): Met Week 4:  PT Short Term Goal 1 (Week 4): STG's = LTG's  Skilled Therapeutic Interventions/Progress Updates:  Treatment focused on ambulation on stairs, ambulation with large based quad cane, standing balance with quad cane using mirror for feedback and wheelchair mobility.   Therapy Documentation Precautions:  Precautions Precautions: Fall Precaution Comments: Left sided weakness, right gaze preference, left inattention/neglect, left shoulder subluxation Restrictions Weight Bearing Restrictions: No  Pain: Pain Assessment Pain Assessment: No/denies pain  Locomotion : Ambulation Ambulation/Gait Assistance: 2: Max assist Patient ambulated with large based quad cane and maximal assistance for balance as patient pushes to left 30 feet x 1 and 35 feet x 2. Patient ambulated up and down 3 steps x 2 with rail and moderate assistance. Patient is able to progress left LE independently. He occasionally requires cueing to  adjust foot placement. Patient tends to push to left during stance and ambulation. Patient propelled wheelchair 80 feet using bilateral LE's and right UE. Patient requires cueing to keep stepping forward with left LE.  Patient propelled remainder to room with right extremities. Patient continues to require cueing to attend to left.   See FIM for current functional status  Therapy/Group: Individual Therapy  Arelia Longest M 04/15/2012, 12:14 PM

## 2012-04-15 NOTE — Progress Notes (Signed)
Speech Language Pathology Daily Session Note  Patient Details  Name: Shaun Pugh MRN: [id number] Date of Birth: 04-07-1951  Today's Date: 04/15/2012 Time: 1100-1130 Time Calculation (min): 30 min  Short Term Goals: Week 4: SLP Short Term Goal 1 (Week 4): Patient will solve basic problems during functional tasks with min assist visual and verbal cueing. SLP Short Term Goal 2 (Week 4): Patient will attend to the left during basic functional tasks with min assist verbal cuing. SLP Short Term Goal 3 (Week 4): Patient will demonstrate emergent awareness by asking for help when needed with mod assist verbal cueing. SLP Short Term Goal 4 (Week 4): Patient will alternate attention between the clinician and a basic functional task with min assist verbal and visual cues for redirection. SLP Short Term Goal 5 (Week 4): Patient will utilize external aids to facilitate recall of new, daily information with min assist multimodal cueing.  Skilled Therapeutic Interventions: Skilled treatment session focused on addressing cognition goals during a self care task.  Patient propelled wheelchair to therapy with min assist verbal cues to attend to left side of environment and with increased wait time self corrected x1.  SLP facilitated session with scheduling task and mod assist verbal and visual cues to follow directions and scan left.    FIM:  Comprehension Comprehension Mode: Auditory Comprehension: 4-Understands basic 75 - 89% of the time/requires cueing 10 - 24% of the time Expression Expression Mode: Verbal Expression: 5-Expresses complex 90% of the time/cues < 10% of the time Social Interaction Social Interaction: 6-Interacts appropriately with others with medication or extra time (anti-anxiety, antidepressant). Problem Solving Problem Solving: 4-Solves basic 75 - 89% of the time/requires cueing 10 - 24% of the time Memory Memory: 4-Recognizes or recalls 75 - 89% of the time/requires cueing 10 - 24%  of the time FIM - Eating Eating Activity: 5: Set-up assist for open containers  Pain Pain Assessment Pain Assessment: No/denies pain  Therapy/Group: Individual Therapy  Charlane Ferretti., CCC-SLP [phone]  Shaun Pugh 04/15/2012, 4:26 PM

## 2012-04-15 NOTE — Progress Notes (Signed)
Physical Therapy Session Note  Patient Details  Name: Shaun Pugh MRN: [id number] Date of Birth: 08/30/1950  Today's Date: 04/15/2012 Time: 6962-9528 Time Calculation (min): 65 min  Short Term Goals: Week 3:  PT Short Term Goal 1 (Week 3): Patient will move supine to and from sit with minimal steadying assist and verbal cueing PT Short Term Goal 1 - Progress (Week 3): Met PT Short Term Goal 2 (Week 3): Patient will transfer to right and left with minimal assistance. PT Short Term Goal 2 - Progress (Week 3): Met PT Short Term Goal 3 (Week 3): Patient will propel wheelchair 150 feet with supervision and minimal verbal cueing PT Short Term Goal 3 - Progress (Week 3): Met PT Short Term Goal 4 (Week 3): Patient will stand 5 minutes with +1 max assist with UE support PT Short Term Goal 4 - Progress (Week 3): Met PT Short Term Goal 5 (Week 3): Patient will ambulate 25 feet with +1 max assist and least restrictive assistive device. PT Short Term Goal 5 - Progress (Week 3): Met Week 4:  PT Short Term Goal 1 (Week 4): STG's = LTG's  Skilled Therapeutic Interventions/Progress Updates:  Patient transferred to [place] and worked on left LE strengthening x 6 minutes with the last 2 minutes only using LE's. Patient used Wii bowling to work on dynamic standing balance. Patient able to maintain standing with minimal assistance. Remainder of treatment session focused on patient/family education of car transfer to wife's [employer]. Patient performed car transfer with min assist from wife. Wife instructed on how to fold wheelchair which is unable to fit into trunk of VW. Wheelchair will fit in the back seat. Patient transferred out of car with wife's min assist. Patient returned to room and recliner with min assist.    Therapy Documentation Precautions:  Precautions Precautions: Fall Precaution Comments: Left sided weakness, right gaze preference, left inattention/neglect, left shoulder  subluxation Restrictions Weight Bearing Restrictions: No  Pain: Pain Assessment Pain Assessment: No/denies pain  See FIM for current functional status  Therapy/Group: Individual Therapy  Arelia Longest M 04/15/2012, 4:01 PM

## 2012-04-16 ENCOUNTER — Inpatient Hospital Stay (HOSPITAL_COMMUNITY): Payer: Medicare Other | Admitting: Occupational Therapy

## 2012-04-16 ENCOUNTER — Encounter (HOSPITAL_COMMUNITY): Payer: Medicare Other | Admitting: Speech Pathology

## 2012-04-16 NOTE — Progress Notes (Signed)
Patient ID: RUGER SAXER, male   DOB: [birth year], 61 y.o.   MRN: [id number] Patient ID: NICKALUS THORNSBERRY, male   DOB: 1951-04-16, 61 y.o.   MRN: [id number]  12/7.  Alert and offers no complaints.  Up in a wheelchair.  Has had some constipation issues but did finally have a bowel movement earlier this a.m. ENT negative. Chest clear. Cardiovascular normal heart sounds no murmurs. No tachycardia. Abdomen soft nontender. Extremities no edema. Neuro normal speech with left hemiparesis   BP Readings from Last 3 Encounters:  04/16/12 115/64  03/23/12 136/75  09/25/11 149/65    CBG (last 3)  No results found for this basename: GLUCAP:3 in the last 72 hours    YAW ESCOTO is a 61 y.o. RH-male with history of CAD, TIAs with left visual field deficits as well as dizziness/HA. Admitted on 03/17/12 with HA, left sided weakness and numbness with slurred speech. CT head negative and treated with tPA. CTA head/neck with hypodensity right superior gyrus, ulcerated plaque right ICA with 60% ICA stenosis, suspicion for 10mm focal thrombus proximal aortic arch. Patient developed combativeness with agitation as well as focal motor seizures LUE on 03/18/12. He was treated with ativan and started on Keppra. F/u CCT with extensive area of low density affecting cortical and subcortical right hemisphere in MCA distribution. 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 Placed on ASA and no anticoagulation due to large stroke and risk of bleeding. EEG without epileptiform activity. He did develop fevers and was started on antibiotics for LLL infiltrate. Carotid dopplers done revealing 60-79% ICA stenosis. 2D echo with EF 65-70%   Subjective/Complaints: Constipated and  cathed this am  Review of Systems  Constitutional: Positive for malaise/fatigue.  Respiratory: Negative for cough.   Cardiovascular: Negative for chest pain.  Neurological: Positive for sensory  change, speech change and focal weakness. Negative for seizures.  All other systems reviewed and are negative.    Objective: Vital Signs: Blood pressure 115/64, pulse 76, temperature 97.9 F (36.6 C), temperature source Oral, resp. rate 20, weight 106.3 kg (234 lb 5.6 oz), SpO2 98.00%. No results found. No results found for this or any previous visit (from the past 72 hour(s)).   Nursing note and vitals reviewed.  Constitutional: He appears well-developed and well-nourished.  HENT:  Head: Normocephalic and atraumatic.  Eyes: Pupils are equal, round, and reactive to light.  Neck: Normal range of motion. Neck supple.  Cardiovascular: Normal rate and regular rhythm.  Pulmonary/Chest: Effort normal and breath sounds normal.  Abdominal: Soft. Bowel sounds are normal.  Musculoskeletal: He exhibits no edema.  Neurological: He is alert.  Oriented to self and situation, place, reason... Able to follow basic commands. Dense left hemiparesis.0/5 LUE except1/5 L finger flexors 1/5 L triceps  3-/5 L HE/KE and  ankle PF , R side 5/5. Mild left inattention. Skin: Skin is warm and dry.  1/2 FB sublux on L,   Assessment/Plan: 1. Functional deficits secondary to Large R MCA infarct with L HP, L hemisensory def, cognitive deficits which require 3+ hours per day of interdisciplinary therapy in a comprehensive inpatient rehab setting. Physiatrist is providing close team supervision and 24 hour management of active medical problems listed below. Physiatrist and rehab team continue to assess barriers to discharge/monitor patient progress toward functional and medical goals. FIM: FIM - Bathing Bathing Steps Patient Completed: Chest;Left Arm;Abdomen;Front perineal area;Buttocks;Right upper leg;Left upper leg;Right lower leg (including foot);Left lower leg (  including foot) Bathing: 4: Min-Patient completes 8-9 45f 10 parts or 75+ percent  FIM - Upper Body Dressing/Undressing Upper body dressing/undressing  steps patient completed: Thread/unthread right sleeve of pullover shirt/dresss;Put head through opening of pull over shirt/dress;Pull shirt over trunk Upper body dressing/undressing: 4: Min-Patient completed 75 plus % of tasks FIM - Lower Body Dressing/Undressing Lower body dressing/undressing steps patient completed: Thread/unthread right underwear leg;Thread/unthread left underwear leg;Pull underwear up/down;Thread/unthread right pants leg;Thread/unthread left pants leg;Pull pants up/down;Fasten/unfasten right shoe;Fasten/unfasten left shoe Lower body dressing/undressing: 3: Mod-Patient completed 50-74% of tasks  FIM - Toileting Toileting steps completed by patient: Adjust clothing prior to toileting;Performs perineal hygiene;Adjust clothing after toileting Toileting Assistive Devices: Grab bar or rail for support Toileting: 0: Activity did not occur  FIM - Diplomatic Services operational officer Devices: Grab bars Toilet Transfers: 0-Activity did not occur  FIM - Banker Devices: Bed rails Bed/Chair Transfer: 4: Bed > Chair or W/C: Min A (steadying Pt. > 75%);4: Chair or W/C > Bed: Min A (steadying Pt. > 75%)  FIM - Locomotion: Wheelchair Distance: 120 Locomotion: Wheelchair: 5: Travels 150 ft or more: maneuvers on rugs and over door sills with supervision, cueing or coaxing FIM - Locomotion: Ambulation Locomotion: Ambulation Assistive Devices: Occupational hygienist Ambulation/Gait Assistance: 2: Max assist Locomotion: Ambulation: 1: Travels less than 50 ft with maximal assistance (Pt: 25 - 49%)  Comprehension Comprehension Mode: Auditory Comprehension: 4-Understands basic 75 - 89% of the time/requires cueing 10 - 24% of the time  Expression Expression Mode: Verbal Expression: 5-Expresses complex 90% of the time/cues < 10% of the time  Social Interaction Social Interaction: 6-Interacts appropriately with others with medication or extra time  (anti-anxiety, antidepressant).  Problem Solving Problem Solving: 4-Solves basic 75 - 89% of the time/requires cueing 10 - 24% of the time  Memory Memory: 4-Recognizes or recalls 75 - 89% of the time/requires cueing 10 - 24% of the time   Medical Problem List and Plan:  1. DVT Prophylaxis/Anticoagulation: Pharmaceutical: Lovenox  2. Pain Management: Was on started on NSAIDS PTA due to left foot pain. Will monitor for symptoms with increase in activity levels. Continue prn tylenol for now.  3. Mood: patient with poor awareness of deficits.. Feels down  appetite improved with lexapro and ritalin 4. Neuropsych: This patient is not capable of making decisions on his/her own behalf.  5. Urinary retention no obvious meds check UA and trial flomax 6. ZOX:[id number]. Off antihypertensives at current time. Will increase hctz to 25mg . Check bmet monday 7. Focal motor seizures: no recurrence continue keppra 1250 mg bid.  8. Insomnia/Hallucinations:  scheduled trazodone at bedtime may contribute to retention will D/C.  9. Symptomatic R-ICA stenosis: continue ASA and plavix. Neurology indicates for surgery past discharge?   LOS (Days) 24 A FACE TO FACE EVALUATION WAS PERFORMED  Rogelia Boga 04/16/2012, 9:45 AM

## 2012-04-16 NOTE — Progress Notes (Signed)
Occupational Therapy Session Note  Patient Details  Name: Shaun Pugh MRN: [id number] Date of Birth: 1950-08-09  Today's Date: 04/16/2012 Time: 1330-1430 Time Calculation (min): 60 min  Skilled Therapeutic Interventions/Progress Updates: TAG group with focus on UE strengthening and neuro reeducation to L UE; patient c/o nausea and weakness but did not want meds     Therapy Documentation Precautions:  Precautions Precautions: Fall Precaution Comments: Left sided weakness, right gaze preference, left inattention/neglect, left shoulder subluxation Restrictions Weight Bearing Restrictions: No   Pain:denied   See FIM for current functional status  Therapy/Group: Group Therapy  Rozelle Logan 04/16/2012, 11:59 PM

## 2012-04-17 ENCOUNTER — Inpatient Hospital Stay (HOSPITAL_COMMUNITY): Payer: Medicare Other | Admitting: Physical Therapy

## 2012-04-17 NOTE — Progress Notes (Signed)
Patient ID: Shaun Pugh, male   DOB: 10-09-1950, 61 y.o.   MRN: [id number] Patient ID: Shaun Pugh, male   DOB: February 07, 1951, 61 y.o.   MRN: [id number] Patient ID: Shaun Pugh, male   DOB: [birth year], 61 y.o.   MRN: [id number]  12/8.  Alert and offers no complaints.  Up in a wheelchair.  Has had some constipation issues but did finally have a bowel movement  yesterday.  ENT negative. Chest clear. Cardiovascular normal heart sounds no murmurs. No tachycardia. Abdomen soft nontender. Extremities no edema. Neuro normal speech with left hemiparesis   BP Readings from Last 3 Encounters:  04/17/12 103/69  03/23/12 136/75  09/25/11 149/65    CBG (last 3)  No results found for this basename: GLUCAP:3 in the last 72 hours    Shaun Pugh is a 61 y.o. RH-male with history of CAD, TIAs with left visual field deficits as well as dizziness/HA. Admitted on 03/17/12 with HA, left sided weakness and numbness with slurred speech. CT head negative and treated with tPA. CTA head/neck with hypodensity right superior gyrus, ulcerated plaque right ICA with 60% ICA stenosis, suspicion for 10mm focal thrombus proximal aortic arch. Patient developed combativeness with agitation as well as focal motor seizures LUE on 03/18/12. He was treated with ativan and started on Keppra. F/u CCT with extensive area of low density affecting cortical and subcortical right hemisphere in MCA distribution. 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 Placed on ASA and no anticoagulation due to large stroke and risk of bleeding. EEG without epileptiform activity. He did develop fevers and was started on antibiotics for LLL infiltrate. Carotid dopplers done revealing 60-79% ICA stenosis. 2D echo with EF 65-70%   Subjective/Complaints: Constipated and  cathed this am  Review of Systems  Constitutional: Positive for malaise/fatigue.  Respiratory: Negative for cough.    Cardiovascular: Negative for chest pain.  Neurological: Positive for sensory change, speech change and focal weakness. Negative for seizures.  All other systems reviewed and are negative.    Objective: Vital Signs: Blood pressure 103/69, pulse 87, temperature 97.5 F (36.4 C), temperature source Oral, resp. rate 18, weight 106.3 kg (234 lb 5.6 oz), SpO2 93.00%. No results found. Results for orders placed during the hospital encounter of 03/23/12 (from the past 72 hour(s))  URINE CULTURE     Status: Normal (Preliminary result)   Collection Time   04/15/12  4:48 PM      Component Value Range Comment   Specimen Description URINE, CATHETERIZED      Special Requests NONE      Culture  Setup Time 04/15/2012 17:31      Colony Count PENDING      Culture Culture reincubated for better growth      Report Status PENDING        Nursing note and vitals reviewed.  Constitutional: He appears well-developed and well-nourished.  HENT:  Head: Normocephalic and atraumatic.  Eyes: Pupils are equal, round, and reactive to light.  Neck: Normal range of motion. Neck supple.  Cardiovascular: Normal rate and regular rhythm.  Pulmonary/Chest: Effort normal and breath sounds normal.  Abdominal: Soft. Bowel sounds are normal.  Musculoskeletal: He exhibits no edema.  Neurological: He is alert.  Oriented to self and situation, place, reason... Able to follow basic commands. Dense left hemiparesis.0/5 LUE except1/5 L finger flexors 1/5 L triceps  3-/5 L HE/KE and  ankle PF , R side 5/5. Mild  left inattention. Skin: Skin is warm and dry.  1/2 FB sublux on L,   Assessment/Plan: 1. Functional deficits secondary to Large R MCA infarct with L HP, L hemisensory def, cognitive deficits which require 3+ hours per day of interdisciplinary therapy in a comprehensive inpatient rehab setting. Physiatrist is providing close team supervision and 24 hour management of active medical problems listed below. Physiatrist  and rehab team continue to assess barriers to discharge/monitor patient progress toward functional and medical goals. FIM: FIM - Bathing Bathing Steps Patient Completed: Chest;Left Arm;Abdomen;Front perineal area;Buttocks;Right upper leg;Left upper leg;Right lower leg (including foot);Left lower leg (including foot) Bathing: 4: Min-Patient completes 8-9 30f 10 parts or 75+ percent  FIM - Upper Body Dressing/Undressing Upper body dressing/undressing steps patient completed: Thread/unthread right sleeve of pullover shirt/dresss;Put head through opening of pull over shirt/dress;Pull shirt over trunk Upper body dressing/undressing: 4: Min-Patient completed 75 plus % of tasks FIM - Lower Body Dressing/Undressing Lower body dressing/undressing steps patient completed: Thread/unthread right underwear leg;Thread/unthread left underwear leg;Pull underwear up/down;Thread/unthread right pants leg;Thread/unthread left pants leg;Pull pants up/down;Fasten/unfasten right shoe;Fasten/unfasten left shoe Lower body dressing/undressing: 3: Mod-Patient completed 50-74% of tasks  FIM - Toileting Toileting steps completed by patient: Adjust clothing prior to toileting;Performs perineal hygiene;Adjust clothing after toileting Toileting Assistive Devices: Grab bar or rail for support Toileting: 0: Activity did not occur  FIM - Diplomatic Services operational officer Devices: Grab bars Toilet Transfers: 0-Activity did not occur  FIM - Banker Devices: Bed rails Bed/Chair Transfer: 4: Bed > Chair or W/C: Min A (steadying Pt. > 75%);4: Chair or W/C > Bed: Min A (steadying Pt. > 75%)  FIM - Locomotion: Wheelchair Distance: 120 Locomotion: Wheelchair: 5: Travels 150 ft or more: maneuvers on rugs and over door sills with supervision, cueing or coaxing FIM - Locomotion: Ambulation Locomotion: Ambulation Assistive Devices: Occupational hygienist Ambulation/Gait Assistance: 2: Max  assist Locomotion: Ambulation: 1: Travels less than 50 ft with maximal assistance (Pt: 25 - 49%)  Comprehension Comprehension Mode: Auditory Comprehension: 5-Understands basic 90% of the time/requires cueing < 10% of the time  Expression Expression Mode: Verbal Expression: 5-Expresses basic 90% of the time/requires cueing < 10% of the time.  Social Interaction Social Interaction: 6-Interacts appropriately with others with medication or extra time (anti-anxiety, antidepressant).  Problem Solving Problem Solving: 4-Solves basic 75 - 89% of the time/requires cueing 10 - 24% of the time  Memory Memory: 4-Recognizes or recalls 75 - 89% of the time/requires cueing 10 - 24% of the time   Medical Problem List and Plan:  1. DVT Prophylaxis/Anticoagulation: Pharmaceutical: Lovenox  2. Pain Management: Was on started on NSAIDS PTA due to left foot pain. Will monitor for symptoms with increase in activity levels. Continue prn tylenol for now.  3. Mood: patient with poor awareness of deficits.. Feels down  appetite improved with lexapro and ritalin 4. Neuropsych: This patient is not capable of making decisions on his/her own behalf.  5. Urinary retention no obvious meds check UA and trial flomax 6. ZOX:[id number]. Off antihypertensives at current time. Will increase hctz to 25mg . Check bmet monday 7. Focal motor seizures: no recurrence continue keppra 1250 mg bid.  8. Insomnia/Hallucinations:  scheduled trazodone at bedtime may contribute to retention will D/C.  9. Symptomatic R-ICA stenosis: continue ASA and plavix. Neurology indicates for surgery past discharge?   LOS (Days) 25 A FACE TO FACE EVALUATION WAS PERFORMED  Rogelia Boga 04/17/2012, 8:44 AM

## 2012-04-17 NOTE — Progress Notes (Signed)
Physical Therapy Note  Patient Details  Name: Shaun Pugh MRN: [id number] Date of Birth: 08-21-1950 Today's Date: 04/17/2012  Time: 1000-1030 30 minutes  No c/o pain. Gait training with [facility] with min A, 2 x 60' with cues to step through with L LE.  Standing NMR for L LE coordination with cone taps and LE lifts.  Pt improving balance and gait.  Individual therapy  Elnita Surprenant 04/17/2012, 10:53 AM

## 2012-04-18 ENCOUNTER — Inpatient Hospital Stay (HOSPITAL_COMMUNITY): Payer: Medicare Other

## 2012-04-18 ENCOUNTER — Inpatient Hospital Stay (HOSPITAL_COMMUNITY): Payer: Medicare Other | Admitting: Speech Pathology

## 2012-04-18 DIAGNOSIS — G8110 Spastic hemiplegia affecting unspecified side: Secondary | ICD-10-CM

## 2012-04-18 DIAGNOSIS — I69922 Dysarthria following unspecified cerebrovascular disease: Secondary | ICD-10-CM

## 2012-04-18 DIAGNOSIS — I6340 Cerebral infarction due to embolism of unspecified cerebral artery: Secondary | ICD-10-CM

## 2012-04-18 LAB — URINE CULTURE: Colony Count: 75000

## 2012-04-18 NOTE — Progress Notes (Signed)
Patient ID: DMARIUS REEDER, male   DOB: Jun 15, 1950, 61 y.o.   MRN: [id number] HORTON ELLITHORPE is a 61 y.o. RH-male with history of CAD, TIAs with left visual field deficits as well as dizziness/HA. Admitted on 03/17/12 with HA, left sided weakness and numbness with slurred speech. CT head negative and treated with tPA. CTA head/neck with hypodensity right superior gyrus, ulcerated plaque right ICA with 60% ICA stenosis, suspicion for 10mm focal thrombus proximal aortic arch. Patient developed combativeness with agitation as well as focal motor seizures LUE on 03/18/12. He was treated with ativan and started on Keppra. F/u CCT with extensive area of low density affecting cortical and subcortical right hemisphere in MCA distribution. 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 Placed on ASA and no anticoagulation due to large stroke and risk of bleeding. EEG without epileptiform activity. He did develop fevers and was started on antibiotics for LLL infiltrate. Carotid dopplers done revealing 60-79% ICA stenosis. 2D echo with EF 65-70%   Subjective/Complaints: Constipated, voided twice yesterday but was cathed once.  Wife has not learned I/O cath technique yet  Review of Systems  Constitutional: Positive for malaise/fatigue.  Respiratory: Negative for cough.   Cardiovascular: Negative for chest pain.  Neurological: Positive for sensory change, speech change and focal weakness. Negative for seizures.  All other systems reviewed and are negative.    Objective: Vital Signs: Blood pressure 127/72, pulse 77, temperature 97.3 F (36.3 C), temperature source Oral, resp. rate 18, weight 106.3 kg (234 lb 5.6 oz), SpO2 96.00%. No results found. Results for orders placed during the hospital encounter of 03/23/12 (from the past 72 hour(s))  URINE CULTURE     Status: Normal (Preliminary result)   Collection Time   04/15/12  4:48 PM      Component Value Range  Comment   Specimen Description URINE, CATHETERIZED      Special Requests NONE      Culture  Setup Time 04/15/2012 17:31      Colony Count PENDING      Culture Culture reincubated for better growth      Report Status PENDING        Nursing note and vitals reviewed.  Constitutional: He appears well-developed and well-nourished.  HENT:  Head: Normocephalic and atraumatic.  Eyes: Pupils are equal, round, and reactive to light.  Neck: Normal range of motion. Neck supple.  Cardiovascular: Normal rate and regular rhythm.  Pulmonary/Chest: Effort normal and breath sounds normal.  Abdominal: Soft. Bowel sounds are normal.  Musculoskeletal: He exhibits no edema.  Neurological: He is alert.  Oriented to self and situation, place, reason... Able to follow basic commands. Dense left hemiparesis.0/5 LUE except1/5 L finger flexors 1/5 L triceps  3-/5 L HE/KE and  ankle PF , R side 5/5. Mild left inattention. Skin: Skin is warm and dry.  1/2 FB sublux on L,   Assessment/Plan: 1. Functional deficits secondary to Large R MCA infarct with L HP, L hemisensory def, cognitive deficits which require 3+ hours per day of interdisciplinary therapy in a comprehensive inpatient rehab setting. Physiatrist is providing close team supervision and 24 hour management of active medical problems listed below. Physiatrist and rehab team continue to assess barriers to discharge/monitor patient progress toward functional and medical goals. FIM: FIM - Bathing Bathing Steps Patient Completed: Chest;Left Arm;Abdomen;Front perineal area;Buttocks;Right upper leg;Left upper leg;Right lower leg (including foot);Left lower leg (including foot) Bathing: 4: Min-Patient completes 8-9 80f 10 parts  or 75+ percent  FIM - Upper Body Dressing/Undressing Upper body dressing/undressing steps patient completed: Thread/unthread right sleeve of pullover shirt/dresss;Put head through opening of pull over shirt/dress;Pull shirt over  trunk Upper body dressing/undressing: 4: Min-Patient completed 75 plus % of tasks FIM - Lower Body Dressing/Undressing Lower body dressing/undressing steps patient completed: Thread/unthread right underwear leg;Thread/unthread left underwear leg;Pull underwear up/down;Thread/unthread right pants leg;Thread/unthread left pants leg;Pull pants up/down;Fasten/unfasten right shoe;Fasten/unfasten left shoe Lower body dressing/undressing: 3: Mod-Patient completed 50-74% of tasks  FIM - Toileting Toileting steps completed by patient: Adjust clothing prior to toileting;Performs perineal hygiene;Adjust clothing after toileting Toileting Assistive Devices: Grab bar or rail for support Toileting: 0: Activity did not occur  FIM - Diplomatic Services operational officer Devices: Grab bars Toilet Transfers: 0-Activity did not occur  FIM - Banker Devices: Bed rails Bed/Chair Transfer: 4: Bed > Chair or W/C: Min A (steadying Pt. > 75%);4: Chair or W/C > Bed: Min A (steadying Pt. > 75%)  FIM - Locomotion: Wheelchair Distance: 120 Locomotion: Wheelchair: 5: Travels 150 ft or more: maneuvers on rugs and over door sills with supervision, cueing or coaxing FIM - Locomotion: Ambulation Locomotion: Ambulation Assistive Devices: Occupational hygienist Ambulation/Gait Assistance: 2: Max assist Locomotion: Ambulation: 1: Travels less than 50 ft with maximal assistance (Pt: 25 - 49%)  Comprehension Comprehension Mode: Auditory Comprehension: 5-Understands complex 90% of the time/Cues < 10% of the time  Expression Expression Mode: Verbal Expression: 5-Expresses complex 90% of the time/cues < 10% of the time  Social Interaction Social Interaction: 7-Interacts appropriately with others - No medications needed.  Problem Solving Problem Solving: 5-Solves complex 90% of the time/cues < 10% of the time  Memory Memory: 5-Recognizes or recalls 90% of the time/requires cueing <  10% of the time   Medical Problem List and Plan:  1. DVT Prophylaxis/Anticoagulation: Pharmaceutical: Lovenox  2. Pain Management: Was on started on NSAIDS PTA due to left foot pain. Will monitor for symptoms with increase in activity levels. Continue prn tylenol for now.  3. Mood: patient with poor awareness of deficits.. Feels down  appetite improved with lexapro and ritalin 4. Neuropsych: This patient is not capable of making decisions on his/her own behalf.  5. Urinary retention no obvious meds check UA and trial flomax 6. WUJ:[id number]. Off antihypertensives at current time. Will increase hctz to 25mg . Check bmet monday 7. Focal motor seizures: no recurrence continue keppra 1250 mg bid.  8. Insomnia/Hallucinations:  scheduled trazodone at bedtime may contribute to retention will D/C.  9. Symptomatic R-ICA stenosis: continue ASA and plavix. Neurology indicates for surgery past discharge?   LOS (Days) 26 A FACE TO FACE EVALUATION WAS PERFORMED  Mauro Arps E 04/18/2012, 7:15 AM

## 2012-04-18 NOTE — Progress Notes (Signed)
Nutrition Follow-up  Intervention:   Encouraged intake  Assessment:   Spoke with patient.  Reports eating better with improved appetite.  Eating 50-75% meals.  Weight increased from last visit.  Diet Order:  Reg with Carnation Instant Breakfast or High protein Shake tid with meals.  Ensure Complete bid  Meds: Scheduled Meds:    . aspirin EC  325 mg Oral Daily  . atorvastatin  20 mg Oral q1800  . bethanechol  25 mg Oral TID  . clopidogrel  75 mg Oral Q breakfast  . enoxaparin  40 mg Subcutaneous Q24H  . fluticasone  1 spray Each Nare Daily  . hydrochlorothiazide  25 mg Oral Daily  . levETIRAcetam  1,250 mg Oral BID  . methylphenidate  10 mg Oral BID WC  . pantoprazole  40 mg Oral Daily  . potassium chloride  10 mEq Oral BID  . senna-docusate  2 tablet Oral BID  . Tamsulosin HCl  0.8 mg Oral QPC supper   Continuous Infusions:  PRN Meds:.acetaminophen, albuterol, alum & mag hydroxide-simeth, bisacodyl, guaiFENesin-dextromethorphan, ipratropium, polyethylene glycol, prochlorperazine, prochlorperazine, prochlorperazine   CMP     Component Value Date/Time   NA 133* 04/04/2012 0828   K 4.1 04/04/2012 0828   CL 94* 04/04/2012 0828   CO2 22 04/04/2012 0828   GLUCOSE 144* 04/04/2012 0828   BUN 25* 04/04/2012 0828   CREATININE 1.04 04/13/2012 0635   CALCIUM 10.5 04/04/2012 0828   PROT 7.2 03/24/2012 0615   ALBUMIN 3.1* 03/24/2012 0615   AST 19 03/24/2012 0615   ALT 35 03/24/2012 0615   ALKPHOS 61 03/24/2012 0615   BILITOT 0.6 03/24/2012 0615   GFRNONAA 75* 04/13/2012 0635   GFRAA 87* 04/13/2012 0635    CBG (last 3)  No results found for this basename: GLUCAP:3 in the last 72 hours   Intake/Output Summary (Last 24 hours) at 04/18/12 1740 Last data filed at 04/18/12 1400  Gross per 24 hour  Intake   1080 ml  Output    500 ml  Net    580 ml    Weight Status:  106.3  Re-estimated needs:  2100-2300 kcal, 110-120 gm protein  Nutrition Dx:  Inadequate oral intake r/t  decreased appetite progressing AEB reported and documented intake  Goal:  Intake of >75% meals and supplements  Monitor:  Intake, labs, weight   Oran Rein, RD, LDN Clinical Inpatient Dietitian Pager:  [phone] Weekend and after hours pager:  [phone]

## 2012-04-18 NOTE — Progress Notes (Signed)
Physical Therapy Session Note  Patient Details  Name: Shaun Pugh MRN: [id number] Date of Birth: 12-Oct-1950  Today's Date: 04/18/2012 Time: 1035-1130 Time Calculation (min): 55 min  Short Term Goals: Week 4:  PT Short Term Goal 1 (Week 4): STG's = LTG's  Skilled Therapeutic Interventions/Progress Updates:   Wife Arline Asp present, she performed bed transfers x 2, min assist stand/pivot.    Newman Nickels, ATC here for w/c eval considering pt's previous LBP/disability , 4 back surgeries; need for w/c for mobility in home at d/c.  W/c mobility using RUE and RLE x 150' on tile and carpet, with mod VCs for L attention, steering.    Bed mobility on regular bed, on risers, 28" high: sit>< supine with supervision, VCs to remember L arm, and roll before sitting up.  Pt demonstrated good activation to abdominals during R sidelying > sit.  Pt c/o dizziness when sitting on EOB x 3 minutes.  Wife reported that pt was dizzy Friday with SLP when being pushed in w/c in busy environment of lobby.  Discussed with PA; pt started a new med Friday for bladder.  Therapeutic exercise performed with LE to increase strength for functional mobility, in sitting; neuromuscular re-education for L foot placement with hip adduction/abduction.  Gait on 5 stairs, 1 rail, with min/mod assist for L foot placement, upright trunk.    Therapy Documentation Precautions:  Precautions Precautions: Fall Precaution Comments: Left sided weakness, right gaze preference, left inattention/neglect, left shoulder subluxation Restrictions Weight Bearing Restrictions: No   Vital Signs: BP 127/84; HR 82; O2 sats on room air = 96%.   Pain: Pain Assessment Pain Assessment: No/denies pain      See FIM for current functional status  Therapy/Group: Individual Therapy  Dajour Pierpoint 04/18/2012, 12:31 PM

## 2012-04-18 NOTE — Progress Notes (Addendum)
Pt assisted to stand, attempt to void, unable to do so; does report sensation. I/O cathed for 500cc. (12 hour period.)  Wife observed procedure, to perform with assist next cath.  At 1800, wife reports pt. voided, RN unaware, not scanned.  Wife and staff educated re: PVR's each void. Monitor. Carlean Purl 1900; Pt scanned for 192cc; voided 300cc after scan performed. Carlean Purl

## 2012-04-18 NOTE — Progress Notes (Signed)
Physical Therapy Note  Patient Details  Name: Shaun Pugh MRN: [id number] Date of Birth: [birth year] Today's Date: 04/18/2012  1:05 - 1:35 30 minutes Individual session Patient denies pain.  Patient ambulated with large based quad cane and max assist for weight shifting and balance 60 feet x 1 and 45 feet x 2. Patient independently progresses left LE but tends to push to left more and more with increased distance. Patient exercised for 6 minutes on Nustep working on left LE strengthening and control.    Arelia Longest M 04/18/2012, 1:38 PM

## 2012-04-18 NOTE — Progress Notes (Signed)
Occupational Therapy Session Note  Patient Details  Name: Shaun Pugh MRN: [id number] Date of Birth: 1951-05-06  Today's Date: 04/18/2012 Time: 5621-3086 Time Calculation (min): 75 min  Short Term Goals: Week 3:  OT Short Term Goal 1 (Week 3): STGs = LTGs  Skilled Therapeutic Interventions/Progress Updates:    ADL re-training and family education completed in tub/shower combo this AM. Wife educated on proper transfer techniques, hemi-dressing techniques, bathing, and safety awareness during bathing and dressing. Wife completed family education successfully.  Wife also given UE and neck HEP handouts. Reviewed each exercise with wife who verbalized understanding.  Therapy Documentation Precautions:  Precautions Precautions: Fall Precaution Comments: Left sided weakness, right gaze preference, left inattention/neglect, left shoulder subluxation Restrictions Weight Bearing Restrictions: No  Pain: Pain Assessment Pain Assessment: No/denies pain  See FIM for current functional status  Therapy/Group: Individual Therapy  Limmie Patricia, OTR/L 04/18/2012, 11:23 AM

## 2012-04-18 NOTE — Progress Notes (Signed)
Speech Language Pathology Daily Session Note  Patient Details  Name: Shaun Pugh MRN: [id number] Date of Birth: 13-Oct-1950  Today's Date: 04/18/2012 Time: 1000-1030 Time Calculation (min): 30 min  Short Term Goals: Week 4: SLP Short Term Goal 1 (Week 4): Patient will solve basic problems during functional tasks with min assist visual and verbal cueing. SLP Short Term Goal 2 (Week 4): Patient will attend to the left during basic functional tasks with min assist verbal cuing. SLP Short Term Goal 3 (Week 4): Patient will demonstrate emergent awareness by asking for help when needed with mod assist verbal cueing. SLP Short Term Goal 4 (Week 4): Patient will alternate attention between the clinician and a basic functional task with min assist verbal and visual cues for redirection. SLP Short Term Goal 5 (Week 4): Patient will utilize external aids to facilitate recall of new, daily information with min assist multimodal cueing.  Skilled Therapeutic Interventions: Skilled treatment session focused on addressing cognition goals during a self-care task. Patient attended to left environment and left upper extremity with min assist verbal cues faded to supervision level verbal cues.  Patient also alternated attention between discussion regarding discharge planning and a sequencing task that focused on scanning left with overall min assist clinician cues.     FIM:  Comprehension Comprehension Mode: Auditory Comprehension: 5-Understands complex 90% of the time/Cues < 10% of the time Expression Expression Mode: Verbal Expression: 5-Expresses complex 90% of the time/cues < 10% of the time Social Interaction Social Interaction: 6-Interacts appropriately with others with medication or extra time (anti-anxiety, antidepressant). Problem Solving Problem Solving: 5-Solves basic 90% of the time/requires cueing < 10% of the time Memory Memory: 5-Requires cues to use assistive device  Pain Pain  Assessment Pain Assessment: No/denies pain  Therapy/Group: Individual Therapy  Charlane Ferretti., CCC-SLP [phone]  [name] 04/18/2012, 10:54 AM

## 2012-04-19 ENCOUNTER — Inpatient Hospital Stay (HOSPITAL_COMMUNITY): Payer: Medicare Other | Admitting: Occupational Therapy

## 2012-04-19 ENCOUNTER — Inpatient Hospital Stay (HOSPITAL_COMMUNITY): Payer: Medicare Other | Admitting: Speech Pathology

## 2012-04-19 ENCOUNTER — Inpatient Hospital Stay (HOSPITAL_COMMUNITY): Payer: Medicare Other

## 2012-04-19 NOTE — Progress Notes (Signed)
Speech Language Pathology Daily Session Note  Patient Details  Name: Shaun Pugh MRN: [id number] Date of Birth: April 27, 1951  Today's Date: 04/19/2012 Time: 1005-1030 Time Calculation (min): 25 min  Short Term Goals: Week 4: SLP Short Term Goal 1 (Week 4): Patient will solve basic problems during functional tasks with min assist visual and verbal cueing. SLP Short Term Goal 2 (Week 4): Patient will attend to the left during basic functional tasks with min assist verbal cuing. SLP Short Term Goal 3 (Week 4): Patient will demonstrate emergent awareness by asking for help when needed with mod assist verbal cueing. SLP Short Term Goal 4 (Week 4): Patient will alternate attention between the clinician and a basic functional task with min assist verbal and visual cues for redirection. SLP Short Term Goal 5 (Week 4): Patient will utilize external aids to facilitate recall of new, daily information with min assist multimodal cueing.  Skilled Therapeutic Interventions: Skilled treatment session focused on addressing cognitive-linguistic goals.  SLP facilitated session with a categorical generation tasks with increased wait time to alternate attention between tasks and conversation as well as increased wait time to utilize word finding strategies.  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   FIM:  Comprehension Comprehension Mode: Auditory Comprehension: 5-Follows basic conversation/direction: With no assist Expression Expression Mode: Verbal Expression: 5-Expresses complex 90% of the time/cues < 10% of the time Social Interaction Social Interaction: 6-Interacts appropriately with others with medication or extra time (anti-anxiety, antidepressant). Problem Solving Problem Solving: 5-Solves basic 90% of the time/requires cueing < 10% of the time Memory Memory: 5-Recognizes or recalls 90% of the  time/requires cueing < 10% of the time  Pain Pain Assessment Pain Assessment: No/denies pain  Therapy/Group: Individual Therapy  Charlane Ferretti., CCC-SLP [phone]  [name] 04/19/2012, 4:59 PM

## 2012-04-19 NOTE — Progress Notes (Signed)
Physical Therapy Discharge Summary  Patient Details  Name: Shaun Pugh MRN: [id number] Date of Birth: Jun 25, 1950  Today's Date: 04/19/2012 Time: 4098-1191 Time Calculation (min): 45 min  Patient has met 10 of 10 long term goals due to improved activity tolerance, improved balance, improved postural control, increased strength and functional use of  left lower extremity.  Patient to discharge at a wheelchair level Min Assist.   Patient's care partner is independent to provide the necessary physical assistance at discharge.  Reasons goals not met: N/A Patient met all LTG's.   Recommendation:  Patient will benefit from ongoing skilled PT services in home health setting to continue to advance safe functional mobility, address ongoing impairments in left inattention, left hemiplegia, and minimize fall risk.  Equipment: wheelchair, wheelchair cushion  Reasons for discharge: treatment goals met and discharge from hospital  Patient/family agrees with progress made and goals achieved: Yes  PT Discharge  Pain Pain Assessment Pain Assessment: No/denies pain  Cognition Overall Cognitive Status: Appears within functional limits for tasks assessed Sensation Sensation Light Touch: Appears Intact Stereognosis: Impaired by gross assessment Stereognosis Impaired Details: Impaired LUE Proprioception: Impaired by gross assessment Proprioception Impaired Details: Impaired LUE Motor  Motor Motor: Hemiplegia  Mobility Bed Mobility Bed Mobility: Rolling Right;Rolling Left;Right Sidelying to Sit;Supine to Sit Rolling Right: 5: Supervision (occ verbal cueing to attend to left arm) Rolling Right Details: Verbal cues for precautions/safety Rolling Left: 5: Supervision Rolling Left Details: Verbal cues for precautions/safety Right Sidelying to Sit: 5: Supervision Right Sidelying to Sit Details: Verbal cues for precautions/safety Sit to Supine: 5: Supervision (verbal cueing to attend to  left) Sit to Supine - Details: Verbal cues for precautions/safety Locomotion  Ambulation Ambulation: Yes Ambulation/Gait Assistance: 3: Mod assist Ambulation Distance (Feet): 100 Feet Assistive device: Large base quad cane Ambulation/Gait Assistance Details: Manual facilitation for weight shifting Ambulation/Gait Assistance Details: Patient progresses left LE independently needing occasional cueing to incresase stride on left. Patient has decreased stance time on left but is able to maintain hip and knee extension on left. Patient with decreased dorsiflexion on left creating foot flat contact. The further patient ambulates the more he pushes to the left.  Corporate treasurer: Yes Wheelchair Assistance: 5: Financial planner Details: Verbal cues for precautions/safety (Patient tends to neglect left side and requires verbal cues ) Wheelchair Propulsion: Right upper extremity;Right lower extremity Wheelchair Parts Management: Needs assistance   Games developer Sitting - Balance Support: Feet supported;No upper extremity supported Static Sitting - Level of Assistance: 7: Independent Extremity Assessment   LLE Assessment LLE Assessment: Exceptions to [facility] LLE Strength LLE Overall Strength Comments: Patient has active movement of hip flexion against gravity in supine Patient has full knee extension in sitting. Patient also has dorsiflexion in sitting. - Grossly 3+/5   See FIM for current functional status  Treatment consisted of ambulation up and down 5 steps with 1 rail and moderate assistance. Patient ambulated with large based quad cane 60 feet and 100 feet with moderate assistance. Patient demonstrated bed mobility on regular double bed with supervision. Patient also demonstrated transfers bed to and from wheelchair going to right and left with supervision. This PT still recommended to patient and wife to provide minimal steadying  assistance with transfers to avoid falls due to left inattention. Wife and patient reported having no further questions and ready for discharge tomorrow.    Arelia Longest M 04/19/2012, 3:39 PM

## 2012-04-19 NOTE — Progress Notes (Signed)
Occupational Therapy Discharge Summary  Patient Details  Name: Shaun Pugh MRN: [id number] Date of Birth: May 27, 1950  Today's Date: 04/19/2012 Time: 0830-0930 Time Calculation (min): 60 min  Patient has met 8 of 10 long term goals due to improved activity tolerance, improved balance, postural control, ability to compensate for deficits, functional use of  LEFT upper extremity, improved attention and improved awareness.  Patient to discharge at [facility] Assist level.  Patient's care partner is independent to provide the necessary physical and cognitive assistance at discharge.    Reasons goals not met: Patient requires Min Assist for grooming and UB dressing.  Recommendation:  Patient will benefit from ongoing skilled OT services in home health setting to continue to advance functional skills in the area of BADL.  Equipment: BSC and tub transfer bench  Reasons for discharge: Patient met maximum potential for rehab.  Patient/family agrees with progress made and goals achieved: Yes  OT Discharge Precautions/Restrictions  Precautions Precautions: Fall Precaution Comments: Left sided weakness, right gaze preference, left inattention/neglect, left shoulder subluxation  Pain Pain Assessment Pain Assessment: No/denies pain Vision/Perception  Vision - History Baseline Vision: No visual deficits Patient Visual Report: Peripheral vision impairment Vision - Assessment Alignment/Gaze Preference: Chin down;Gaze right;Head turned  Cognition Overall Cognitive Status: Impaired Arousal/Alertness: Awake/alert Orientation Level: Oriented X4 Focused Attention: Impaired Awareness: Impaired Problem Solving: Impaired Reasoning: Impaired Initiating: Impaired Self Monitoring: Impaired Safety/Judgment: Impaired Sensation Sensation Light Touch: Appears Intact Stereognosis: Impaired Detail Stereognosis Impaired Details: Impaired LUE Hot/Cold: Appears Intact Proprioception: Impaired  Detail Proprioception Impaired Details: Impaired LUE Coordination Gross Motor Movements are Fluid and Coordinated: No Fine Motor Movements are Fluid and Coordinated: No Coordination and Movement Description: Impaired LUE Finger Nose Finger Test: Absent LUE Motor  Motor Motor: Hemiplegia Extremity/Trunk Assessment RUE Assessment RUE Assessment: Within Functional Limits (MMT: 5/5) LUE Assessment LUE Assessment: Exceptions to WFL LUE PROM (degrees) Left Shoulder Flexion:  (less than 90 degrees due to pain) LUE Strength Left Shoulder Flexion: 3-/5 Left Shoulder Extension: 1/5 Left Shoulder Horizontal ABduction: 1/5 Left Shoulder Horizontal ADduction: 1/5 Left Wrist Flexion: 0/5 Left Wrist Extension: 0/5 Gross Grasp: Impaired  See FIM for current functional status  D/C ADL completed in shower this AM/ Patient has met all LTGs with the exception of grooming and UB dressing. Still struggling with donning shirts with hemi-dressing technique. Patient still requires max vc's during dressing for technique. Wife has completed family training and HEP has been issued. Patient is ready for D/C home with wife.   Limmie Patricia, OTR/L 04/19/2012, 10:31 AM

## 2012-04-19 NOTE — Progress Notes (Signed)
Patient ID: [name], male   DOB: [birth year], 61 y.o.   MRN: [id number] Shaun Pugh is a 61 y.o. RH-male with history of CAD, TIAs with left visual field deficits as well as dizziness/HA. Admitted on 03/17/12 with HA, left sided weakness and numbness with slurred speech. CT head negative and treated with tPA. CTA head/neck with hypodensity right superior gyrus, ulcerated plaque right ICA with 60% ICA stenosis, suspicion for 10mm focal thrombus proximal aortic arch. Patient developed combativeness with agitation as well as focal motor seizures LUE on 03/18/12. He was treated with ativan and started on Keppra. F/u CCT with extensive area of low density affecting cortical and subcortical right hemisphere in MCA distribution. 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 Placed on ASA and no anticoagulation due to large stroke and risk of bleeding. EEG without epileptiform activity. He did develop fevers and was started on antibiotics for LLL infiltrate. Carotid dopplers done revealing 60-79% ICA stenosis. 2D echo with EF 65-70%   Subjective/Complaints: Constipated, voided three times yesterday but was cathed once.  Wife has not learned I/O cath technique yet  Review of Systems  Constitutional: Positive for malaise/fatigue.  Respiratory: Negative for cough.   Cardiovascular: Negative for chest pain.  Neurological: Positive for sensory change, speech change and focal weakness. Negative for seizures.  All other systems reviewed and are negative.    Objective: Vital Signs: Blood pressure 129/79, pulse 73, temperature 97.8 F (36.6 C), temperature source Oral, resp. rate 19, weight 106.3 kg (234 lb 5.6 oz), SpO2 99.00%. No results found. No results found for this or any previous visit (from the past 72 hour(s)).   Nursing note and vitals reviewed.  Constitutional: He appears well-developed and well-nourished.  HENT:  Head: Normocephalic and  atraumatic.  Eyes: Pupils are equal, round, and reactive to light.  Neck: Normal range of motion. Neck supple.  Cardiovascular: Normal rate and regular rhythm.  Pulmonary/Chest: Effort normal and breath sounds normal.  Abdominal: Soft. Bowel sounds are normal.  Musculoskeletal: He exhibits no edema.  Neurological: He is alert.  Oriented to self and situation, place, reason... Able to follow basic commands. Dense left hemiparesis.0/5 LUE except1/5 L finger flexors 1/5 L triceps  3-/5 L HE/KE and  ankle PF , R side 5/5. Mild left inattention. Skin: Skin is warm and dry.  1/2 FB sublux on L,   Assessment/Plan: 1. Functional deficits secondary to Large R MCA infarct with L HP, L hemisensory def, cognitive deficits SHould be ready for D/C in am.  Wife needs cath trainingFIM: FIM - Bathing Bathing Steps Patient Completed: Chest;Left Arm;Abdomen;Front perineal area;Buttocks;Right upper leg;Left upper leg;Right lower leg (including foot);Left lower leg (including foot) Bathing: 4: Min-Patient completes 8-9 24f 10 parts or 75+ percent  FIM - Upper Body Dressing/Undressing Upper body dressing/undressing steps patient completed: Thread/unthread right sleeve of pullover shirt/dresss;Put head through opening of pull over shirt/dress;Pull shirt over trunk Upper body dressing/undressing: 4: Min-Patient completed 75 plus % of tasks FIM - Lower Body Dressing/Undressing Lower body dressing/undressing steps patient completed: Thread/unthread right underwear leg;Thread/unthread left underwear leg;Pull underwear up/down;Thread/unthread right pants leg;Thread/unthread left pants leg;Pull pants up/down;Fasten/unfasten right shoe;Fasten/unfasten left shoe Lower body dressing/undressing: 3: Mod-Patient completed 50-74% of tasks  FIM - Toileting Toileting steps completed by patient: Adjust clothing prior to toileting;Performs perineal hygiene;Adjust clothing after toileting Toileting Assistive Devices: Grab bar or  rail for support Toileting: 0: Activity did not occur  FIM - Archivist  Transfers Assistive Devices: Therapist, music Transfers: 0-Activity did not occur  FIM - Banker Devices: Bed rails Bed/Chair Transfer: 5: Supine > Sit: Supervision (verbal cues/safety issues);5: Sit > Supine: Supervision (verbal cues/safety issues);4: Bed > Chair or W/C: Min A (steadying Pt. > 75%);4: Chair or W/C > Bed: Min A (steadying Pt. > 75%)  FIM - Locomotion: Wheelchair Distance: 120 Locomotion: Wheelchair: 5: Travels 150 ft or more: maneuvers on rugs and over door sills with supervision, cueing or coaxing FIM - Locomotion: Ambulation Locomotion: Ambulation Assistive Devices: Occupational hygienist Ambulation/Gait Assistance: 2: Max assist Locomotion: Ambulation: 2: Travels 50 - 149 ft with maximal assistance (Pt: 25 - 49%)  Comprehension Comprehension Mode: Auditory Comprehension: 5-Understands complex 90% of the time/Cues < 10% of the time  Expression Expression Mode: Verbal Expression: 5-Expresses complex 90% of the time/cues < 10% of the time  Social Interaction Social Interaction: 6-Interacts appropriately with others with medication or extra time (anti-anxiety, antidepressant).  Problem Solving Problem Solving: 5-Solves complex 90% of the time/cues < 10% of the time  Memory Memory: 5-Recognizes or recalls 90% of the time/requires cueing < 10% of the time   Medical Problem List and Plan:  1. DVT Prophylaxis/Anticoagulation: Pharmaceutical: Lovenox  2. Pain Management: Was on started on NSAIDS PTA due to left foot pain. Will monitor for symptoms with increase in activity levels. Continue prn tylenol for now.  3. Mood: patient with poor awareness of deficits.. Feels down  appetite improved with lexapro and ritalin 4. Neuropsych: This patient is not capable of making decisions on his/her own behalf.  5. Urinary retention no obvious meds check UA and  trial flomax 6. ZOX:[id number]. Off antihypertensives at current time. Will increase hctz to 25mg . Check bmet monday 7. Focal motor seizures: no recurrence continue keppra 1250 mg bid.  8. Insomnia/Hallucinations:  scheduled trazodone at bedtime may contribute to retention will D/C.  9. Symptomatic R-ICA stenosis: continue ASA and plavix. Neurology indicates for surgery past discharge?   LOS (Days) 27 A FACE TO FACE EVALUATION WAS PERFORMED  Manuella Blackson E 04/19/2012, 7:29 AM

## 2012-04-19 NOTE — Progress Notes (Signed)
Occupational Therapy Note  Patient Details  Name: Shaun Pugh MRN: [id number] Date of Birth: [birth year] Today's Date: 04/19/2012  Time In:  1100  Time Out:  1205.  Patient seen for individual session, no complaints of pain.  Team asked for vestibular evaluation to rule out vestibular dysfunction as patient has been intermittently complaining on "dizziness", nausea and on two occassions vomiting.  Completed vestibular assessment and findings are as follows:  Patient presents with unilateral vestibular dysfunction characterized by left "pull" from midline in standing and walking, motion sensitivity with supine to sit, sit to stand, standing and in walking.  Patient is further motion sensitive to horizontal and vertical head turns in standing and presumably in gait (patient's mobility not intact enough to test head turns in gait).  Patient is also motion sensitive with movement through space (i.e. Riding in a wheelchair) due to visual vestibular integration disturbances.  Patient does not appear to be experiencing any true gaze stabilization issues is experiencing convergence issues with objects approximately 15 feet away due to mild ocular malalignment.  Patient will benefit from home program that will be provided by this therapist to patient and wife as patient is discharging tomorrow.  Wife and patient have agreed to give home program to HHOT/PT to incorporate into treatment.  Patient will also benefit from progressing from [facility] therapies at some point to the outpatient vestibular program - patient and wife away of recommendations and in agreement.  Treatment will include habituation of supine to sit, sit to stand, head turns in STATIC standing with progression to head turns in gait.  Patient should NOT attempt head turns in gait until he is no longer symptomatic in static standing.  Initially patient should fixate on far target with gait training and then progress to head turns in gait.  Patient will also  benefit from ocular motor exercises and visual-vestibular integration exercises.  Patient is aware of compensatory grounding to assist with decreasing symptoms when necessary (both in sitting and in supine).  Detailed explanation given to both patient and wife who state they understand and have no further questions.  Patient and wife are aware that initially all training in standing and gait should only be done with a licensed therapist.    Norton Pastel 04/19/2012, 12:19 PM

## 2012-04-19 NOTE — Progress Notes (Signed)
Social Work Patient ID: Shaun Pugh, male   DOB: 11-May-1951, 61 y.o.   MRN: [id number] Met with pt and wife to discuss discharge for tomorrow, wife feels comfortable with pt's care and feels education is complete. DME to be delivered and follow up via home health then transition to OP therapies.  Pt glad to be going home.

## 2012-04-19 NOTE — Progress Notes (Signed)
Bladder scanned patient and was getting inconsistent readings anywhere from 11ml to 92 ml.  Wife was present to do the in and out cath. Sterile procedure explained step by step by this nurse.  Wife was very adept at maintaining sterile procedure during catherization. This nurse feels that the wife will be able to perform I&O caths without difficulty. Patient tolerated procedure well. 600 cc of dark straw colored urine obtained.  Stated he felt no urge to urinate.

## 2012-04-20 MED ORDER — SENNOSIDES-DOCUSATE SODIUM 8.6-50 MG PO TABS: 2.0000 | ORAL_TABLET | Freq: Two times a day (BID) | ORAL | Status: DC

## 2012-04-20 MED ORDER — FLUTICASONE PROPIONATE 50 MCG/ACT NA SUSP: 1.0000 | Freq: Every day | NASAL | Status: DC

## 2012-04-20 MED ORDER — CLOPIDOGREL BISULFATE 75 MG PO TABS: 75.0000 mg | ORAL_TABLET | Freq: Every day | ORAL | Status: DC

## 2012-04-20 MED ORDER — PANTOPRAZOLE SODIUM 40 MG PO TBEC: 40.0000 mg | DELAYED_RELEASE_TABLET | Freq: Every day | ORAL | Status: DC

## 2012-04-20 MED ORDER — POTASSIUM CHLORIDE CRYS ER 10 MEQ PO TBCR: 10.0000 meq | EXTENDED_RELEASE_TABLET | Freq: Two times a day (BID) | ORAL | Status: DC

## 2012-04-20 MED ORDER — ASPIRIN 325 MG PO TBEC: 325.0000 mg | DELAYED_RELEASE_TABLET | Freq: Every day | ORAL | Status: DC

## 2012-04-20 MED ORDER — ATORVASTATIN CALCIUM 20 MG PO TABS: 20.0000 mg | ORAL_TABLET | Freq: Every day | ORAL | Status: DC

## 2012-04-20 MED ORDER — METHYLPHENIDATE HCL 10 MG PO TABS: 10.0000 mg | ORAL_TABLET | Freq: Two times a day (BID) | ORAL | Status: DC

## 2012-04-20 MED ORDER — TAMSULOSIN HCL 0.4 MG PO CAPS: 0.8000 mg | ORAL_CAPSULE | Freq: Every day | ORAL | Status: DC

## 2012-04-20 MED ORDER — LEVETIRACETAM 250 MG PO TABS: 250.0000 mg | ORAL_TABLET | Freq: Two times a day (BID) | ORAL | Status: DC

## 2012-04-20 MED ORDER — HYDROCHLOROTHIAZIDE 25 MG PO TABS: 25.0000 mg | ORAL_TABLET | Freq: Every day | ORAL | Status: DC

## 2012-04-20 MED ORDER — LEVETIRACETAM 1000 MG PO TABS: 1000.0000 mg | ORAL_TABLET | Freq: Two times a day (BID) | ORAL | Status: DC

## 2012-04-20 MED ORDER — BETHANECHOL CHLORIDE 25 MG PO TABS: 25.0000 mg | ORAL_TABLET | Freq: Three times a day (TID) | ORAL | Status: DC

## 2012-04-20 NOTE — Progress Notes (Signed)
Patient evaluated for long-term disease management services with [facility] Care Management Program as a benefit of his [employer] insurance. Met with patient and wife at bedside prior to discharge to explain services again and have consents signed. Patient will receive a post discharge transition of care call and monthly home visits for assessments and for education. Left [facility] Care Management packet along with contact information at bedside. Also explained services with [facility] Care Management will not interfere with services arranged by inpatient social work.  Raiford Noble, MSN, BSN, RN, [facility] Liaison [phone]

## 2012-04-20 NOTE — Plan of Care (Signed)
Problem: RH BLADDER ELIMINATION Goal: RH STG MANAGE BLADDER WITH EQUIPMENT WITH ASSISTANCE STG Manage Bladder With Equipment With min Assistance  Outcome: Adequate for Discharge Wife to perform in and out caths for patient at home.

## 2012-04-20 NOTE — Progress Notes (Signed)
Social Work Discharge Note Discharge Note  The overall goal for the admission was met for:   Discharge location: Yes-HOME WITH WIFE PROVIDING 24 HR CARE  Length of Stay: Yes-28 DAYS  Discharge activity level: Yes-MIN/SUPERVISION LEVEL  Home/community participation: Yes  Services provided included: MD, RD, PT, OT, SLP, RN, TR, Pharmacy, Neuropsych and SW  Financial Services: Private Insurance: [employer]ARE  Follow-up services arranged: Home Health: [facility]-PT,OT,SPT,RN, DME: [facility]-BSC, [facility] [employer] [employer] [facility]-W/C and Patient/Family has no preference for HH/DME agencies  Comments (or additional information):FAMILY EDUCATION COMPLETED AND PT AND WIFE READY FOR DISCHARGE  Patient/Family verbalized understanding of follow-up arrangements: Yes  Individual responsible for coordination of the follow-up plan: CYNTHIA-WIFE  Confirmed correct DME delivered: Lucy Chris 04/20/2012    Lucy Chris

## 2012-04-20 NOTE — Progress Notes (Signed)
Speech Language Pathology Discharge Summary  Patient Details  Name: Shaun Pugh MRN: [id number] Date of Birth: 1950-11-11  Today's Date: 04/20/2012  Patient has met 8 of 8 long term goals.  Patient to discharge at overall Supervision level.  Reasons goals not met: n/a   Clinical Impression/Discharge Summary: Patient met 8 out of 8 long term goals due to functional gains in cognitive-linguistic skills and swallow function. Patient progressed from Dys.3 textures and thin liquids to regular textures and thin liquids with modified independence; max assist with cognitive-linguistic deficits to supervision with basic tasks due to continued left inattention, self monitoring and correcting with basic and high level tasks.     Care Partner:  Caregiver Able to Provide Assistance: Yes  Type of Caregiver Assistance: Physical;Cognitive  Recommendation:  Home Health SLP;Outpatient SLP;24 hour supervision/assistance  Rationale for SLP Follow Up: Maximize cognitive function and independence;Reduce caregiver burden   Equipment: none   Reasons for discharge: Treatment goals met;Discharged from hospital   Patient/Family Agrees with Progress Made and Goals Achieved: Yes   See FIM for current functional status  Charlane Ferretti., CCC-SLP [phone]  Laverta Harnisch 04/20/2012, 9:13 AM

## 2012-04-20 NOTE — Progress Notes (Signed)
Patient ID: Shaun Pugh, male   DOB: 18-Mar-1951, 61 y.o.   MRN: [id number] Shaun Pugh is a 61 y.o. RH-male with history of CAD, TIAs with left visual field deficits as well as dizziness/HA. Admitted on 03/17/12 with HA, left sided weakness and numbness with slurred speech. CT head negative and treated with tPA. CTA head/neck with hypodensity right superior gyrus, ulcerated plaque right ICA with 60% ICA stenosis, suspicion for 10mm focal thrombus proximal aortic arch. Patient developed combativeness with agitation as well as focal motor seizures LUE on 03/18/12. He was treated with ativan and started on Keppra. F/u CCT with extensive area of low density affecting cortical and subcortical right hemisphere in MCA distribution. 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 Placed on ASA and no anticoagulation due to large stroke and risk of bleeding. EEG without epileptiform activity. He did develop fevers and was started on antibiotics for LLL infiltrate. Carotid dopplers done revealing 60-79% ICA stenosis. 2D echo with EF 65-70%   Subjective/Complaints: Constipated, voided three times yesterday but was cathed once.  Wife has   learned I/O cath technique   Review of Systems  Constitutional: Positive for malaise/fatigue.  Respiratory: Negative for cough.   Cardiovascular: Negative for chest pain.  Neurological: Positive for sensory change, speech change and focal weakness. Negative for seizures.  All other systems reviewed and are negative.    Objective: Vital Signs: Blood pressure 108/55, pulse 74, temperature 97.9 F (36.6 C), temperature source Oral, resp. rate 18, weight 106.3 kg (234 lb 5.6 oz), SpO2 98.00%. No results found. Results for orders placed during the hospital encounter of 03/23/12 (from the past 72 hour(s))  CREATININE, SERUM     Status: Abnormal   Collection Time   04/20/12  6:05 AM      Component Value Range Comment    Creatinine, Ser 1.17  0.50 - 1.35 mg/dL    GFR calc non Af Amer 65 (*) >90 mL/min    GFR calc Af Amer 75 (*) >90 mL/min      Nursing note and vitals reviewed.  Constitutional: He appears well-developed and well-nourished.  HENT:  Head: Normocephalic and atraumatic.  Eyes: Pupils are equal, round, and reactive to light.  Neck: Normal range of motion. Neck supple.  Cardiovascular: Normal rate and regular rhythm.  Pulmonary/Chest: Effort normal and breath sounds normal.  Abdominal: Soft. Bowel sounds are normal.  Musculoskeletal: He exhibits no edema.  Neurological: He is alert.  Oriented to self and situation, place, reason... Able to follow basic commands. Dense left hemiparesis.0/5 LUE except1/5 L finger flexors 1/5 L triceps  3-/5 L HE/KE and  ankle PF , R side 5/5. Mild left inattention. Skin: Skin is warm and dry.  1/2 FB sublux on L,   Assessment/Plan: 1. Functional deficits secondary to Large R MCA infarct with L HP, L hemisensory def, cognitive deficits ready for D/C    PMR f/u.PCP f/u. Urology f/u.  See D/C summary FIM: FIM - Bathing Bathing Steps Patient Completed: Chest;Left Arm;Abdomen;Front perineal area;Buttocks;Right upper leg;Left upper leg;Right lower leg (including foot);Left lower leg (including foot) Bathing: 4: Min-Patient completes 8-9 86f 10 parts or 75+ percent  FIM - Upper Body Dressing/Undressing Upper body dressing/undressing steps patient completed: Thread/unthread right sleeve of pullover shirt/dresss;Put head through opening of pull over shirt/dress;Pull shirt over trunk Upper body dressing/undressing: 4: Min-Patient completed 75 plus % of tasks FIM - Lower Body Dressing/Undressing Lower body dressing/undressing steps patient completed: Thread/unthread  right underwear leg;Thread/unthread left underwear leg;Pull underwear up/down;Thread/unthread right pants leg;Thread/unthread left pants leg;Pull pants up/down;Fasten/unfasten right shoe;Fasten/unfasten left  shoe Lower body dressing/undressing: 4: Min-Patient completed 75 plus % of tasks  FIM - Toileting Toileting steps completed by patient: Adjust clothing prior to toileting;Performs perineal hygiene;Adjust clothing after toileting Toileting Assistive Devices: Grab bar or rail for support Toileting: 0: Activity did not occur  FIM - Diplomatic Services operational officer Devices: Elevated toilet seat;Grab bars Toilet Transfers: 4-To toilet/BSC: Min A (steadying Pt. > 75%);4-From toilet/BSC: Min A (steadying Pt. > 75%)  FIM - Bed/Chair Transfer Bed/Chair Transfer Assistive Devices: Bed rails Bed/Chair Transfer: 5: Supine > Sit: Supervision (verbal cues/safety issues);5: Sit > Supine: Supervision (verbal cues/safety issues);4: Bed > Chair or W/C: Min A (steadying Pt. > 75%);4: Chair or W/C > Bed: Min A (steadying Pt. > 75%)  FIM - Locomotion: Wheelchair Distance: 120 Locomotion: Wheelchair: 5: Travels 150 ft or more: maneuvers on rugs and over door sills with supervision, cueing or coaxing FIM - Locomotion: Ambulation Locomotion: Ambulation Assistive Devices: Cane - Quad (100 feet) Ambulation/Gait Assistance: 3: Mod assist Locomotion: Ambulation: 2: Travels 50 - 149 ft with moderate assistance (Pt: 50 - 74%)  Comprehension Comprehension Mode: Auditory Comprehension: 5-Follows basic conversation/direction: With no assist  Expression Expression Mode: Verbal Expression: 5-Expresses basic 90% of the time/requires cueing < 10% of the time.  Social Interaction Social Interaction: 6-Interacts appropriately with others with medication or extra time (anti-anxiety, antidepressant).  Problem Solving Problem Solving: 5-Solves basic 90% of the time/requires cueing < 10% of the time  Memory Memory: 5-Recognizes or recalls 90% of the time/requires cueing < 10% of the time   Medical Problem List and Plan:  1. DVT Prophylaxis/Anticoagulation: Pharmaceutical: Lovenox  2. Pain Management: Was  on started on NSAIDS PTA due to left foot pain. Will monitor for symptoms with increase in activity levels. Continue prn tylenol for now.  3. Mood: patient with poor awareness of deficits.. Feels down  appetite improved with lexapro and ritalin 4. Neuropsych: This patient is not capable of making decisions on his/her own behalf.  5. Urinary retention no obvious meds check UA and trial flomax 6. WUJ:[id number]. Off antihypertensives at current time. Will increase hctz to 25mg . Check bmet monday 7. Focal motor seizures: no recurrence continue keppra 1250 mg bid.  8. Insomnia/Hallucinations:  scheduled trazodone at bedtime may contribute to retention will D/C.  9. Symptomatic R-ICA stenosis: continue ASA and plavix. Neurology indicates for surgery past discharge?   LOS (Days) 28 A FACE TO FACE EVALUATION WAS PERFORMED  KIRSTEINS,ANDREW E 04/20/2012, 7:25 AM

## 2012-04-20 NOTE — Progress Notes (Signed)
Patient and family received discharge instructions from Marissa Nestle, [place].  All questions answered.  Patient discharged home.  Patient and wife escorted to private vehicle via wheelchair by [place], [place].

## 2012-05-02 NOTE — Progress Notes (Signed)
Discharge summary [phone]

## 2012-05-03 NOTE — Discharge Summary (Signed)
NAMEMarland Kitchen  ELLA, GUILLOTTE NO.:  [id number]  MEDICAL RECORD NO.:  [id number]  LOCATION:  [id number]                         FACILITY:  [facility]  PHYSICIAN:  Erick Colace, M.D.DATE OF BIRTH:  1950-06-07  DATE OF ADMISSION:  03/23/2012 DATE OF DISCHARGE:  04/20/2012                              DISCHARGE SUMMARY   DISCHARGE DIAGNOSES: 1. 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 2. Left lower lobe pneumonia. 3. Hypertension. 4. Focal motor seizure. 5. Symptomatic right internal carotid artery stenosis. 6. Urinary retention.  HISTORY OF PRESENT ILLNESS:  Mr. Issac Moure is a 61 year old male with history of coronary artery disease and TIA with left visual field deficits who was admitted March 17, 2012, with headache, left-sided weakness, numbness, and slurred speech.  He was treated with tPA.  CTA head and neck showed hypodensity at right superior gyrus, ulcerated plaque at right ICA with 60% ICA stenosis and suspicion of 10-mm focal thrombus proximal aortic arch.  The patient developed combativeness with agitation as well as focal motor seizures of left upper extremity on March 18, 2012.  He was treated with Ativan and was started on Keppra for treatment.  Followup CT showed extensive area of low density affecting cortical and subcortical right hemisphere in the MCA distribution.  Neurology felt the patient with deteriorations due to new thromboembolism from aortic arch thrombus.  He was placed on aspirin and no other anticoagulation due to large stroke and risk for bleeding.  EEG done showed no epileptiform activity.  He did develop fevers and was started on antibiotics for left lower lobe pneumonia.  Carotid Dopplers done showed 60-79% ICA stenosis.  A 2D echo showed EF of 65-70% with moderate LVH.  Mentation is slowly improving.  He is showing improved ability to follow 1-step command.  He continues to have severe deficits in safety  awareness, basic problem solving, sequencing, as well as attention.  He has had some issues with agitation due to delirium. Therapy has been working on pre-gait activity and recommended CIR and therefore he was admitted for further therapies.  PAST MEDICAL HISTORY: 1. Atrophic right kidney. 2. Renal calculi. 3. History of acute pancreatitis. 4. Cholelithiasis. 5. Chronic low back pain. 6. Sleep apnea. 7. Coronary artery disease. 8. Elevated cholesterol.  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  FUNCTIONAL STATUS:  The patient is requiring +2 total assist 10-30% for bed mobility, +2 total assist 30% for sit to stand transfers and [facility].  He required +2 total assist for lower body dressing, +3 total assist to pivot for toilet transfers, noted to be easily distracted. Required step-by-step verbal cues for all aspects of transfers.  HOSPITAL COURSE:  Mr. Kieran Arreguin was admitted to Rehab on March 24, 2012, for inpatient therapies to consist of PT, OT, and speech therapy at least 3 hours 5 days a week.  Past admission, physiatrist, rehab RN, and therapy team have worked together to provide customized collaborative interdisciplinary care.  Rehab RN has worked with the patient on bowel and bladder  program as well as safety plan.  The patient's p.o. intake was monitored and was initially poor.  Nutritional supplements were offered to help the patient maintain his nutritional status.  The patient was limited by his significant left hemiparesis with left inattention, left field cut, as well as poor  energy levels. Routine labs were done during this stay to monitor hydration status. His blood pressures were noted to be variable and hydrochlorothiazide was added with dose titrated to 25 mg b.i.d.  Blood pressures at time of discharge were greatly improved at  108 to 130 systolic.   The patient was started on a toileting program once his Foley was discontinued.  He was had problems with urinary retention.  He was treated for E. coli as well as Staph UTI; however, continued to have problems with retention and  Urecholine and Flomax were added to help with symptoms. Bladder functioning was improved, however, he continues to require catheterization at least twice a day due to intermittent problems with retention.  The patient's wife was educated about in and out cath and was advised to cath q.6-8 h. if the patient had not voided.  He was maintained on Keppra 1250 mg b.i.d. and has been seizure free.  He was noted to have a depressed mood and Leavy Cella, neuropsychologist, was consulted for input and followup during the stay.  Her evaluation reaveled presence of moderate depression and the patient was started on Lexapro for mood stabilization.  During the patient's stay in rehab, weekly team conferences were held to monitor the patient's progress, set goals, as well as discuss barriers to discharge.  The patient's wife has been supportive and has been present for multiple therapy sessions for education.  Speech Therapy has worked with the patient on swallow function as well as cognitive linguistic skills.  The patient was advanced to regular textures of thin liquids, which he was tolerating at modified independent level.  He was able to follow basic commands without assistance.  Was able to express complex need at 90% accuracy.  He continues to require max assist with cognitive linguistic deficits to supervision with basic tasks due to his left inattention, problems with self monitoring and correcting for basic and high-level tasks.  OT has worked with the patient on self-care tasks.  The patient continued to require min assist for grooming and upper body dressing.  Education was done with wife regarding home exercise plan as well as verbal  cuing for dressing techniques.  Physical Therapy has worked with the patient on balance, mobility, and strengthening.  The patient continued to have dense left hemiparesis 0/5 at the left upper extremity except for 1/5 at finger flexures, 1/5 left triceps, 3-/5 left hip extensors, knee extensors, and ankle plantar flexors.  The patient was showing improvement in activity tolerance, balance, as well as strength.  He was at supervision level for bed mobility, supervision for supine to sitting.  Required mod assist for ambulating 100 feet with large base quad cane, supervision for wheelchair navigation.  Family education was done with wife and she was instructed in having patient ambulate only with therapy.  On April 20, 2012, the patient is discharged to home in improved condition.  DISCHARGE MEDICATIONS: 1. Aspirin 325 mg p.o. per day. 2. Lipitor 20 mg p.o. per day. 3. Urecholine 25 mg p.o. t.i.d. 4. Plavix 75 mg p.o. per day. 5. Flonase 1 squirt each nostril daily. 6. Hydrochlorothiazide 25 mg p.o. per day. 7. Keppra 1000 mg p.o. b.i.d., in  addition to 250 mg p.o. b.i.d. 8. Ritalin 10 mg p.o. b.i.d. at 7 a.m. and at noon, #60 prescribed. 9. Protonix 40 mg a day. 10.K-Dur 10 mEq p.o. b.i.d. 11.Senokot-S 2 p.o. b.i.d. 12.Flomax 0.8 mg p.o. at bedtime.  DIET:  Low fat, low cholesterol.  ACTIVITY LEVEL:  24-hour supervision.  SPECIAL INSTRUCTIONS:  As tolerated with assistance.  Keep record of fluid intake.  Cath at night before bedtime and once during the morning, can decrease to once a day if volumes below 300 cc.  Set a toileting schedule, decrease fluid intake after 6:00 p.m.  [facility] to provide PT, OT, speech therapy and RN.  FOLLOWUP:  The patient to follow up with Dr. Pearlean Brownie in 4-6 weeks.  Follow up with Dr. Claudette Laws May 06, 2012, at 10 a.m.  Follow up with Dr. Lorin Picket MacDiarmid May 10, 2012, at 1:45 p.m.  Follow up with Dr. Catalina Pizza April 27, 2012, at 11:30 a.m.     Delle Reining, P.A.   ______________________________ Erick Colace, M.D.    PL/MEDQ  D:  05/02/2012  T:  05/03/2012  Job:  [id number]  cc:   Pramod P. Pearlean Brownie, MD Catalina Pizza, M.D. Martina Sinner, MD

## 2012-05-06 ENCOUNTER — Encounter: Payer: Self-pay | Admitting: Physical Medicine & Rehabilitation

## 2012-05-06 ENCOUNTER — Encounter: Payer: Medicare Other | Attending: Physical Medicine & Rehabilitation

## 2012-05-06 ENCOUNTER — Ambulatory Visit (HOSPITAL_BASED_OUTPATIENT_CLINIC_OR_DEPARTMENT_OTHER): Payer: Medicare Other | Admitting: Physical Medicine & Rehabilitation

## 2012-05-06 VITALS — BP 125/80 | HR 86 | Resp 14 | Ht 73.0 in | Wt 222.4 lb

## 2012-05-06 DIAGNOSIS — I2510 Atherosclerotic heart disease of native coronary artery without angina pectoris: Secondary | ICD-10-CM | POA: Insufficient documentation

## 2012-05-06 DIAGNOSIS — R339 Retention of urine, unspecified: Secondary | ICD-10-CM | POA: Insufficient documentation

## 2012-05-06 DIAGNOSIS — G4730 Sleep apnea, unspecified: Secondary | ICD-10-CM | POA: Insufficient documentation

## 2012-05-06 DIAGNOSIS — I69959 Hemiplegia and hemiparesis following unspecified cerebrovascular disease affecting unspecified side: Secondary | ICD-10-CM | POA: Insufficient documentation

## 2012-05-06 DIAGNOSIS — I69919 Unspecified symptoms and signs involving cognitive functions following unspecified cerebrovascular disease: Secondary | ICD-10-CM | POA: Insufficient documentation

## 2012-05-06 DIAGNOSIS — R414 Neurologic neglect syndrome: Secondary | ICD-10-CM

## 2012-05-06 DIAGNOSIS — G8110 Spastic hemiplegia affecting unspecified side: Secondary | ICD-10-CM

## 2012-05-06 NOTE — Patient Instructions (Signed)
May stop Ritalin Wean bethanechol which is also called Urecholine Twice a day for a week then once a day for a week then stop If you have problems with your urination I would restart 3 times per day

## 2012-05-06 NOTE — Progress Notes (Signed)
Subjective:    Patient ID: Shaun Pugh, male    DOB: Oct 02, 1950, 61 y.o.   MRN: [id number] [name] is a 61 year old male with  history of coronary artery disease and TIA with left visual field  deficits who was admitted March 17, 2012, with headache, left-sided  weakness, numbness, and slurred speech. He was treated with tPA. CTA  head and neck showed hypodensity at right superior gyrus, ulcerated  plaque at right ICA with 60% ICA stenosis and suspicion of 10-mm focal  thrombus proximal aortic arch. The patient developed combativeness with  agitation as well as focal motor seizures of left upper extremity on  March 18, 2012. He was treated with Ativan and was started on Keppra  for treatment. Followup CT showed extensive area of low density  affecting cortical and subcortical right hemisphere in the MCA  distribution. 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 He was placed on aspirin and  no other anticoagulation due to large stroke and risk for bleeding. EEG  done showed no epileptiform activity. He did develop fevers and was  started on antibiotics for left lower lobe pneumonia. Carotid Dopplers  done showed 60-79% ICA stenosis. A 2D echo showed EF of 65-70% with  moderate LVH. Mentation is slowly improving. He is showing improved  ability to follow 1-step command.  HPI [name] once at home without injury when he overshot a chair when sitting down Ambulates with his wife's assistance basically min contact guard Home health PT and OT Primary care followup since discharge No seizures No shoulder pain  Pain Inventory Average Pain 0 Pain Right Now 0 My pain is no pain  In the last 24 hours, has pain interfered with the following? General activity 0 Relation with others 0 Enjoyment of life 0 What TIME of day is your pain at its worst? no pain Sleep (in general) Good  Pain is worse with: no pain Pain  improves with: no pain Relief from Meds: no pain  Mobility use a cane use a wheelchair  Function disabled: date disabled 03/18/12 I need assistance with the following:  dressing, bathing, toileting, meal prep, household duties and shopping  Neuro/Psych weakness trouble walking  Prior Studies Any changes since last visit?  no  Physicians involved in your care Any changes since last visit?  no   Family History  Problem Relation Age of Onset  . Anesthesia problems Neg Hx   . Hypotension Neg Hx   . Malignant hyperthermia Neg Hx   . Pseudochol deficiency Neg Hx   . Congestive Heart Failure Mother   . Diabetes Father   . Heart attack Father    History   Social History  . Marital Status: Married    Spouse Name: N/A    Number of Children: N/A  . Years of Education: N/A   Social History Main Topics  . Smoking status: Former Smoker -- 1.0 packs/day for 45 years    Types: Cigarettes    Quit date: 03/18/2012  . Smokeless tobacco: Never Used  . Alcohol Use: No  . Drug Use: No  . Sexually Active: Yes    Birth Control/ Protection: None   Other Topics Concern  . None   Social History Narrative  . None   Past Surgical History  Procedure Date  . Back surgery     x 4   . Cholecystectomy 09/25/2011    Procedure: LAPAROSCOPIC CHOLECYSTECTOMY;  Surgeon: Shaun Bering,  MD;  Location: [facility];  Service: General;  Laterality: N/A;   Past Medical History  Diagnosis Date  . High cholesterol   . Atrophy of right kidney 09/15/2011  . Kidney calculi 09/15/2011  . Acute pancreatitis 09/14/2011  . Cholelithiasis 09/14/2011  . Chronic back pain   . Sleep apnea     STOP BANG 5  . TIA (transient ischemic attack)   . CAD (coronary artery disease)   . Stented coronary artery approx 2004   BP 125/80  Pulse 86  Resp 14  Ht 6\' 1"  (1.854 m)  Wt 222 lb 6.4 oz (100.88 kg)  BMI 29.34 kg/m2  SpO2 99%    Review of Systems  Genitourinary: Negative for dysuria and difficulty urinating.   Musculoskeletal: Positive for gait problem.  Neurological: Positive for weakness.  All other systems reviewed and are negative.       Objective:   Physical Exam  Nursing note and vitals reviewed. Constitutional: He is oriented to person, place, and time. He appears well-developed.       obese  HENT:  Head: Normocephalic and atraumatic.  Eyes: Conjunctivae normal and EOM are normal. Pupils are equal, round, and reactive to light.       Mild left neglect  Neck: Normal range of motion.  Neurological: He is alert and oriented to person, place, and time. A sensory deficit is present. He exhibits abnormal muscle tone. He displays no seizure activity. Coordination and gait abnormal.  Reflex Scores:      Tricep reflexes are 2+ on the right side and 3+ on the left side.      Bicep reflexes are 2+ on the right side and 3+ on the left side.      Brachioradialis reflexes are 2+ on the right side and 3+ on the left side.      Patellar reflexes are 2+ on the right side and 3+ on the left side.      Achilles reflexes are 2+ on the right side and 3+ on the left side.      Left upper extremity mild reduction Motor strength is 2 minus at the left deltoid 3 minus at the bicep 3 minus at the finger flexors and finger extensors 3 minus at the hip flexors and knee extensors 4 minus at the ankle dorsiflexor and plantar flexors  Increased flexor tone in the left upper extremity Clonus at the finger flexors on the left side  Psychiatric: He has a normal mood and affect.          Assessment & Plan:  1. Right MCA infarct with left spastic hemiparesis and left neglect progressing well with home health therapy. Recommend continue home health therapy one more month then transition to outpatient at [facility] 2. Urinary retention resolved Will wean bethanechol to twice a day for a week and then once a day per week then discontinue 3. Reduced attention concentration related to CVA down to 1 Ritalin  per day may discontinue now

## 2012-05-16 ENCOUNTER — Telehealth: Payer: Self-pay | Admitting: Physical Medicine & Rehabilitation

## 2012-05-16 ENCOUNTER — Telehealth: Payer: Self-pay | Admitting: *Deleted

## 2012-05-16 DIAGNOSIS — R414 Neurologic neglect syndrome: Secondary | ICD-10-CM

## 2012-05-16 DIAGNOSIS — G8110 Spastic hemiplegia affecting unspecified side: Secondary | ICD-10-CM

## 2012-05-16 NOTE — Telephone Encounter (Signed)
Shaun Pugh with [facility] Needs order for patient to go to outpatient therapy starting next week. Will finish up home health this week. For PT and OT.

## 2012-05-16 NOTE — Telephone Encounter (Signed)
Patient wants to go to [facility] for Outpatient therapy.

## 2012-05-31 ENCOUNTER — Ambulatory Visit (HOSPITAL_COMMUNITY)
Admission: RE | Admit: 2012-05-31 | Discharge: 2012-05-31 | Disposition: A | Discharge status: Home / Self Care | Payer: Medicare Other | Source: Ambulatory Visit | Attending: Physical Medicine & Rehabilitation | Admitting: Physical Medicine & Rehabilitation

## 2012-05-31 DIAGNOSIS — R279 Unspecified lack of coordination: Secondary | ICD-10-CM | POA: Insufficient documentation

## 2012-05-31 DIAGNOSIS — G8190 Hemiplegia, unspecified affecting unspecified side: Secondary | ICD-10-CM

## 2012-05-31 DIAGNOSIS — I639 Cerebral infarction, unspecified: Secondary | ICD-10-CM

## 2012-05-31 DIAGNOSIS — R414 Neurologic neglect syndrome: Secondary | ICD-10-CM | POA: Insufficient documentation

## 2012-05-31 DIAGNOSIS — IMO0001 Reserved for inherently not codable concepts without codable children: Secondary | ICD-10-CM | POA: Insufficient documentation

## 2012-05-31 DIAGNOSIS — M6281 Muscle weakness (generalized): Secondary | ICD-10-CM | POA: Insufficient documentation

## 2012-05-31 DIAGNOSIS — I63432 Cerebral infarction due to embolism of left posterior cerebral artery: Secondary | ICD-10-CM | POA: Diagnosis present

## 2012-05-31 DIAGNOSIS — R262 Difficulty in walking, not elsewhere classified: Secondary | ICD-10-CM | POA: Insufficient documentation

## 2012-05-31 NOTE — Evaluation (Signed)
Physical Therapy Evaluation  Patient Details  Name: Shaun Pugh MRN: [id number] Date of Birth: 11-01-1950  Today's Date: 05/31/2012 Time: 3086-5784 PT Time Calculation (min): 30 min Charges: 1 eval  Visit#: 1  of 12   Re-eval: 06/30/12 Assessment Diagnosis: L sided weakness s/p stroke Surgical Date: 03/23/12 (DOI) Next MD Visit: Dr. Wynn Banker - 06/06/12  Authorization: [employer] Medicare  Authorization Time Period:    Authorization Visit#: 1  of 10    Past Medical History:  Past Medical History  Diagnosis Date  . High cholesterol   . Atrophy of right kidney 09/15/2011  . Kidney calculi 09/15/2011  . Acute pancreatitis 09/14/2011  . Cholelithiasis 09/14/2011  . Chronic back pain   . Sleep apnea     STOP BANG 5  . TIA (transient ischemic attack)   . CAD (coronary artery disease)   . Stented coronary artery approx 2004   Past Surgical History:  Past Surgical History  Procedure Date  . Back surgery     x 4   . Cholecystectomy 09/25/2011    Procedure: LAPAROSCOPIC CHOLECYSTECTOMY;  Surgeon: Fabio Bering, MD;  Location: [facility];  Service: General;  Laterality: N/A;    Subjective Symptoms/Limitations Symptoms: see hx section for PMH.  Pertinent History: Pt is referred to PT s/p stroke on 03/17/12 and attended CIR until 04/20/12.  At this time his c/co are decreased confidence with wakling independently, impaired balance, decreased strength to L side.  His wife reports that he requires supervision at home and is having difficulty using household items such as the remote control and other mechanical devices he was able to use in the past.   How long can you stand comfortably?: less than 5 minutes.  How long can you walk comfortably?: 10 minutes pushing shopping cart and becomes winded. Patient Stated Goals: "I want to be able to walk outside again."  Pain Assessment Currently in Pain?: No/denies  Prior Functioning  Home Living Lives With: Spouse Home Access: Stairs to  enter Entrance Stairs-Number of Steps: 3 (on back porch; 2 into church w/handrail) Prior Function Driving: Yes Vocation: Retired Comments: he enjoys going outside and keeping his vehilces cleaned, changes oil, vacuums truck. He enjoys riding his golf cart. Enjoys attending chruch with his wife.     Cognition/Observation Observation/Other Assessments Observations: bumps into objects to left side due to L side neglect.  Other Assessments: feels off balance   Sensation/Coordination/Flexibility/Functional Tests Functional Tests Functional Tests: 5 STS: 27 sec Functional Tests: ABC: 52.5%  Assessment RLE Strength RLE Overall Strength Comments: taken in seated position. Right Hip Flexion: 4/5 Right Hip Extension: 4/5 Right Hip ABduction: 4/5 Right Hip ADduction: 4/5 Right Knee Flexion: 4/5 Right Knee Extension: 4/5 Right Ankle Dorsiflexion: 4/5 LLE Strength LLE Overall Strength Comments: taken in seated position Left Hip Flexion: 3/5 Left Hip Extension: 3-/5 Left Hip ABduction: 3-/5 Left Hip ADduction: 3-/5 Left Knee Flexion: 5/5 Left Knee Extension: 3+/5 Left Ankle Dorsiflexion: 4/5  Mobility/Balance  Ambulation/Gait Ambulation/Gait: Yes Assistive device: Large base quad cane Gait Pattern: Decreased stance time - left;Decreased hip/knee flexion - left Gait velocity: 254' in 2 min.  Posture/Postural Control Posture/Postural Control: Postural limitations Postural Limitations: leans to R side Static Standing Balance Single Leg Stance - Right Leg: 0  Single Leg Stance - Left Leg: 0  Tandem Stance - Right Leg: 0  Tandem Stance - Left Leg: 0  Rhomberg - Eyes Opened: 10  Rhomberg - Eyes Closed: 5  Timed Up and Go Test  TUG: Normal TUG Normal TUG (seconds): 24  (w/quad cane)    Physical Therapy Assessment and Plan PT Assessment and Plan Clinical Impression Statement: Pt is a 62 year old male referred to PT s/p R MCA stroke w/L UE and LE weakness and deficits.  He is  pleasant and has a positive outlook, however does become somewhat frusterated with fine motor activites.   Pt will benefit from skilled therapeutic intervention in order to improve on the following deficits: Abnormal gait;Decreased balance;Decreased activity tolerance;Decreased mobility;Difficulty walking;Decreased strength;Impaired perceived functional ability;Decreased safety awareness Rehab Potential: Good PT Frequency: Min 3X/week PT Duration: 8 weeks PT Treatment/Interventions: DME instruction;Gait training;Stair training;Functional mobility training;Therapeutic activities;Therapeutic exercise;Balance training;Neuromuscular re-education;Patient/family education PT Plan: Complete Berg Balance Test and DGI.  Provided pt w/exercises, unable to demo secondary to time constraints.  Progress activities to improve Berg, DGI, TUG, improve LE strength (squats, heel/toe raises, stair training, TM training) to reach functional goals.     Goals Home Exercise Program Pt will Perform Home Exercise Program: Independently PT Goal: Perform Home Exercise Program - Progress: Goal set today PT Short Term Goals Time to Complete Short Term Goals: 2 weeks PT Short Term Goal 1: Pt will complete the Berg Balance Test and DGI.  PT Short Term Goal 2: Pt will improve L LE strength by 1 muscle grade.  PT Short Term Goal 3: Pt will improve his L LE static balance and demonstrate L tandem stance x15 sec.  PT Short Term Goal 4: Pt will improve his activity tolerance and ambulate with SPC in outdoor and indoor environment x10 minutes.  PT Short Term Goal 5: Pt will perform 5 STS in 8.4 sec. for improved functional strength for age.  Additional PT Short Term Goals?: Yes PT Short Term Goal 6: Pt will improve his activity tolerance and mobility and ambulate 625 feet in 2 minutes for appropriate gait speed for age.  PT Long Term Goals Time to Complete Long Term Goals: 8 weeks PT Long Term Goal 1: Pt will improve his LE  strength to [facility]  in order to ascend and descend 5 stairs w/1 handrail w/reciprocal pattern in order to safely enter community dwellings.  PT Long Term Goal 2: Pt will improve his activity tolerance and demonstrate standing and walking x30 minutes in order to continue with outdoor activities to improve his QOL.  Long Term Goal 3: Pt will improve his ABC to 75% in order to improve his percieved functional ability.  Long Term Goal 4: Pt will improve his dynamic balance and demonstrate outdoor ambulation x20 minutes with a SPC in order to continue with household activities at home to improve his QOL.  PT Long Term Goal 5: Pt will improve his TUG to 13 sec for improved mobility in the community; Berg to 50/56 to decrease fall risk; DGI to 20/24 for improved safety ambulating in community in order to safely attend church activities.   Problem List Patient Active Problem List  Diagnosis  . Acute pancreatitis  . Cholelithiasis  . Hyperglycemia  . Current every day smoker  . Atrophy of right kidney  . Kidney calculi  . Hemiplegia, unspecified, affecting nondominant side  . Stented coronary artery  . Stroke, acute, embolic, right  . Seizure  . Cytotoxic cerebral edema  . Aortic thromboembolism  . Muscle weakness (generalized)  . Difficulty in walking    PT - End of Session Activity Tolerance: Patient tolerated treatment well General Behavior During Session: [facility] for tasks performed Cognition: [facility] for tasks  performed PT Plan of Care PT Home Exercise Plan: Provided with 4 way SLR, bridging and rhomberg stance PT Patient Instructions: Discussed improving activity tolerance with using a kitchen timer and standing starting at 5 minutes and graudally moving up.  discussed importance of HEP.  Consulted and Agree with Plan of Care: Patient;Family member/caregiver Family Member Consulted: Wife (cindy)  GP Functional Assessment Tool Used: ABC: 52.5% Functional Limitation: Mobility: Walking and  moving around Mobility: Walking and Moving Around Current Status [phone]): At least 40 percent but less than 60 percent impaired, limited or restricted Mobility: Walking and Moving Around Goal Status [phone]): At least 1 percent but less than 20 percent impaired, limited or restricted  Lakayla Barrington, PT 05/31/2012, 12:12 PM  Physician Documentation Your signature is required to indicate approval of the treatment plan as stated above.  Please sign and either send electronically or make a copy of this report for your files and return this physician signed original.   Please mark one 1.__approve of plan  2. ___approve of plan with the following conditions.   ______________________________                                                          _____________________ Physician Signature                                                                                                             Date
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
## 2012-06-02 ENCOUNTER — Ambulatory Visit (HOSPITAL_COMMUNITY)
Admission: RE | Admit: 2012-06-02 | Discharge: 2012-06-02 | Disposition: A | Discharge status: Home / Self Care | Payer: Medicare Other | Source: Ambulatory Visit | Attending: Physical Medicine & Rehabilitation | Admitting: Physical Medicine & Rehabilitation

## 2012-06-02 DIAGNOSIS — R414 Neurologic neglect syndrome: Secondary | ICD-10-CM

## 2012-06-02 DIAGNOSIS — R279 Unspecified lack of coordination: Secondary | ICD-10-CM

## 2012-06-02 NOTE — Progress Notes (Signed)
Occupational Therapy Treatment Patient Details  Name: Shaun Pugh MRN: [id number] Date of Birth: December 19, 1950  Today's Date: 06/02/2012 Time: 0272-5366 OT Time Calculation (min): 45 min Edema Massage 845-900 15' NM re-ed 900-930 30'  Visit#: 2  of 36   Re-eval: 06/28/12    Authorization: Medicare - BCBS  Authorization Time Period: before 10th visit  Authorization Visit#: 2  of 10   Subjective Symptoms/Limitations Symptoms: S: My butterfly comes and my hand shakes whenever I try to use my left hand. Pain Assessment Currently in Pain?: No/denies Pain Score:   4 Pain Location: Finger (Comment which one)  Precautions/Restrictions     Exercise/Treatments Supine Flexion: AAROM;10 reps;Limitations (physical assist with verbal cues)   Elbow Exercises Elbow Flexion: AROM;10 reps (facilitation at shoulder to depress) Elbow Extension: AROM;10 reps (facilitation at shoulder to depress)   Theraputty - Flatten: yellow Theraputty - Roll: yellow  Hand Exercises MCPJ Flexion: AROM;5 reps MCPJ Extension: PROM;5 reps Digit Composite Abduction: AROM;10 reps Digit Composite Adduction: AROM;10 reps Theraputty - Flatten: yellow Theraputty - Roll: yellow     Manual Therapy Manual Therapy: Edema management Edema Management: Edema massage to left hand and forearm to decrease swelling, pain with movement, and increase joint mobility. Weight Bearing Technique Weight Bearing Technique: Yes RUE Weight Bearing Technique: Extended arm standing (during theraputty task - flatten) Response to Weight Bearing Technique: tolerated well  Occupational Therapy Assessment and Plan OT Assessment and Plan Clinical Impression Statement: A: Needed min vc's to attend to left side and for attention to task. Tolerated tasks well. Needed facilitation to depress shoulder during  AAROM exercises. Educated to use left hand as muich as possible OT Plan: P: Add more AROM exercises LUE.   Goals Short Term  Goals Time to Complete Short Term Goals: 6 weeks Short Term Goal 1: Patient will be educated on HEP. Short Term Goal 1 Progress: Progressing toward goal Short Term Goal 2: Patient will increase AROM in his left shoulder to 75% for increased ablility to reach for objects when completing functional activiites.  Short Term Goal 2 Progress: Progressing toward goal Short Term Goal 3: Patient will increase left grip strength by 10 pounds and pich strength by 5 pounds for increased ability to open containers when working on his cars. Short Term Goal 3 Progress: Progressing toward goal Short Term Goal 4: Patient will complete Nine Hole Peg Test in standardized fashion. Short Term Goal 4 Progress: Progressing toward goal Short Term Goal 5: Patient will attend to his left side 75% of the time when completing functional activities. Short Term Goal 5 Progress: Progressing toward goal Additional Short Term Goals?: Yes Short Term Goal 6: Patient will dress himself and bathe himself with min pa. Short Term Goal 6 Progress: Progressing toward goal Short Term Goal 7: Patient will decrease edema in left hand to minimal as he increases his functional use of his left hand.  Short Term Goal 7 Progress: Progressing toward goal Short Term Goal 8: Patient will use his left hand as a gross assist Ily with all daily activities. Short Term Goal 8 Progress: Progressing toward goal Short Term Goal 9: Patient will be able to reach forward and grasp his toothbrush with his left hand with minimal facilitation at his left shoulder.  Short Term Goal 9 Progress: Progressing toward goal Long Term Goals Time to Complete Long Term Goals: 12 weeks Long Term Goal 1: Patient will return to [facility] host activities and be able to feed himself a biscuit using his  left hand. Long Term Goal 1 Progress: Progressing toward goal Long Term Goal 2: Patient will increase AROM in his left shoulder to 90% for increased ablility to reach for  objects when completing functional activiites.  Long Term Goal 2 Progress: Progressing toward goal Long Term Goal 3: Patient will increase left grip strength by 40 pounds and pich strength by 10  pounds for increased ability to open containers when working on his cars. Long Term Goal 3 Progress: Progressing toward goal Long Term Goal 4: Patient will improve fine motor coordination in order to complete Nine Hole Peg Test in 45' or less. Long Term Goal 4 Progress: Progressing toward goal Long Term Goal 5: Patient will attend to his left side 95% of the time when completing functional activities. Long Term Goal 5 Progress: Progressing toward goal Additional Long Term Goals?: Yes Long Term Goal 6: Patient will dress himself and bathe himself independently. Long Term Goal 6 Progress: Progressing toward goal Long Term Goal 7: Patient will improve to Brunnstrom stage V-VI in his left upper extremity in order to use his left arm as an active assist with all daily activiites.  Long Term Goal 7 Progress: Progressing toward goal  Problem List Patient Active Problem List  Diagnosis  . Acute pancreatitis  . Cholelithiasis  . Hyperglycemia  . Current every day smoker  . Atrophy of right kidney  . Kidney calculi  . Hemiplegia, unspecified, affecting nondominant side  . Stented coronary artery  . Stroke, acute, embolic, right  . Seizure  . Cytotoxic cerebral edema  . Aortic thromboembolism  . Muscle weakness (generalized)  . Difficulty in walking  . Lack of coordination  . Left-sided neglect    End of Session Activity Tolerance: Patient tolerated treatment well General Behavior During Session: [facility] for tasks performed Cognition: [facility] for tasks performed   Limmie Patricia, OTR/L 06/02/2012, 10:06 AM

## 2012-06-02 NOTE — Progress Notes (Signed)
Physical Therapy Treatment Patient Details  Name: Shaun Pugh MRN: [id number] Date of Birth: June 21, 1950  Today's Date: 06/02/2012 Time: 0802-0848 PT Time Calculation (min): 46 min Charge: Physical performance testing x 38', therex 8'  Visit#: 2  of 12   Re-eval: 06/30/12 Assessment Diagnosis: L sided weakness s/p stroke Surgical Date: 03/23/12 Next MD Visit: Dr. Wynn Banker - 06/06/12  Authorization: BCBS Medicare  Authorization Time Period:    Authorization Visit#: 2  of 10    Subjective: Symptoms/Limitations Symptoms: Pt stated he was tired stated he has been up since 3 this morning. Pain Assessment Currently in Pain?: Yes Pain Score:   4 Pain Location: Finger (Comment which one)  Objective:   Exercise/Treatments Mobility/Balance     Berg Balance Test Sit to Stand: Able to stand without using hands and stabilize independently Standing Unsupported: Able to stand safely 2 minutes Sitting with Back Unsupported but Feet Supported on Floor or Stool: Able to sit safely and securely 2 minutes Stand to Sit: Controls descent by using hands Transfers: Able to transfer safely, minor use of hands Standing Unsupported with Eyes Closed: Able to stand 10 seconds with supervision Standing Ubsupported with Feet Together: Able to place feet together independently and stand for 1 minute with supervision From Standing, Reach Forward with Outstretched Arm: Can reach forward >12 cm safely (5") From Standing Position, Pick up Object from Floor: Able to pick up shoe safely and easily From Standing Position, Turn to Look Behind Over each Shoulder: Looks behind one side only/other side shows less weight shift Turn 360 Degrees: Able to turn 360 degrees safely but slowly Standing Unsupported, Alternately Place Feet on Step/Stool: Able to stand independently and complete 8 steps >20 seconds Standing Unsupported, One Foot in Front: Needs help to step but can hold 15 seconds Standing on One Leg:  Tries to lift leg/unable to hold 3 seconds but remains standing independently Total Score: 42  Dynamic Gait Index Level Surface: Mild Impairment Change in Gait Speed: Moderate Impairment Gait with Horizontal Head Turns: Moderate Impairment Gait with Vertical Head Turns: Moderate Impairment Gait and Pivot Turn: Mild Impairment Step Over Obstacle: Mild Impairment Step Around Obstacles: Mild Impairment Steps: Mild Impairment Total Score: 13   Standing Heel Raises: 10 reps;Limitations Heel Raises Limitations: toe raises 10x      Physical Therapy Assessment and Plan PT Assessment and Plan Clinical Impression Statement: BERG and DGI tests complete, pt followed all commands with no cueing required.  Pt limited by fatigue and did require multiple rest breaks to complete both tests  Began therex for LE strengthening with min cueing for form. PT Plan: Continue with current POC.  Review HEP exercises for correct technique.  Progress activities to improve BERG, DGI, TUG and improve LE strength (add squats, stair training and gait training on TM to reach functional goals.    Goals    Problem List Patient Active Problem List  Diagnosis  . Acute pancreatitis  . Cholelithiasis  . Hyperglycemia  . Current every day smoker  . Atrophy of right kidney  . Kidney calculi  . Hemiplegia, unspecified, affecting nondominant side  . Stented coronary artery  . Stroke, acute, embolic, right  . Seizure  . Cytotoxic cerebral edema  . Aortic thromboembolism  . Muscle weakness (generalized)  . Difficulty in walking  . Lack of coordination  . Left-sided neglect    PT - End of Session Activity Tolerance: Patient tolerated treatment well;Patient limited by fatigue General Behavior During Session: [facility] for tasks  performed Cognition: [facility] for tasks performed  GP    [name] 06/02/2012, 9:20 AM

## 2012-06-03 ENCOUNTER — Ambulatory Visit (HOSPITAL_COMMUNITY)
Admission: RE | Admit: 2012-06-03 | Discharge: 2012-06-03 | Disposition: A | Discharge status: Home / Self Care | Payer: Medicare Other | Source: Ambulatory Visit | Attending: Physical Medicine & Rehabilitation | Admitting: Physical Medicine & Rehabilitation

## 2012-06-03 DIAGNOSIS — R414 Neurologic neglect syndrome: Secondary | ICD-10-CM

## 2012-06-03 DIAGNOSIS — R279 Unspecified lack of coordination: Secondary | ICD-10-CM

## 2012-06-03 NOTE — Progress Notes (Signed)
Occupational Therapy Treatment Patient Details  Name: Shaun Pugh MRN: [id number] Date of Birth: 1950-08-19  Today's Date: 06/03/2012 Time: 1345-1430 OT Time Calculation (min): 45 min Neuroreeducation 45' Visit#: 3  of 36   Re-eval: 06/28/12    Authorization: Medicare - BCBS   Authorization Time Period: before 10th visit  Authorization Visit#: 3  of 10   Subjective  S:  I havent done my exercises.  (Discussed importance of completing HEP including AROM of hand and entire arm, weightbearing, and functional activities involving his left arm.   Pain Assessment Currently in Pain?: Yes Pain Score:   4 Pain Location: Shoulder Pain Orientation: Left  Precautions/Restrictions     Exercise/Treatments Cognitive Exercises Scanning: patient is attending to his left side both his body and his environmental region this date.   Neurological Re-education Exercises Shoulder Flexion: Supine;AAROM;10 reps Shoulder ABduction: Supine;AAROM;10 reps Shoulder Protraction: Supine;AAROM;10 reps Shoulder Horizontal ABduction: Supine;AAROM;10 reps Shoulder External Rotation: Supine;PROM;10 reps Shoulder Internal Rotation: Supine;AROM;10 reps Elbow Flexion: Supine;AAROM;AROM;10 reps Elbow Extension: Supine;AROM;AAROM;10 reps Forearm Supination: Supine;AAROM;10 reps Forearm Pronation: Supine;AAROM;10 reps Wrist Flexion: Supine;AAROM;AROM;10 reps Wrist Extension: Supine;AAROM;AROM;10 reps Sponges: Gross grasp and release 10 reps  Weight Bearing Exercises Seated with weight on hand: seated edge of mat, transferring weight on and off of LUE with min facilitation at elbow and wrist from OTR/L as patient transferred Saebo balls from crate R to L and L to R with RUE.  24 balls in each set.    Development of Reach  Development of Reach: Hand over hand assistance;Reaching Hand over Hand Assistance while Reaching: in supine Min-mod facilitation at elbow for full extension and wrist to unweight arm while  reaching for target on ceiling in front of, to right, to left of body 5 reps to each target Reaching to Waist: seated edge of mat, grasped ball on mat and then reached forward with min-mod facilitation and deposited ball in crate x 10 reps  Grasp and Release Grasp and Release:  (with bean bags and Saebo balls occasional min facilitation for positioning of thumb on ball and to pronate his wrist)        Edema Management: Massage and AROM this date in left hand, patient's edema decreased enough to don edema glove. Educated patient on wear and care schedule of edema glove, cleaning, and contraindications.  Occupational Therapy Assessment and Plan OT Assessment and Plan Clinical Impression Statement: A:  Patient attending to his left side with increased I this date.  Patient requires min facilitation to weightbear and reach with his left arm.  OT Plan: P:  Increase volitional attention to Left, increase abilty to weightbear on his left arm with less cuing, and administer "draw a clock" test.   Goals Short Term Goals Time to Complete Short Term Goals: 6 weeks Short Term Goal 1: Patient will be educated on HEP. Short Term Goal 2: Patient will increase AROM in his left shoulder to 75% for increased ablility to reach for objects when completing functional activiites.  Short Term Goal 3: Patient will increase left grip strength by 10 pounds and pich strength by 5 pounds for increased ability to open containers when working on his cars. Short Term Goal 4: Patient will complete Nine Hole Peg Test in standardized fashion. Short Term Goal 5: Patient will attend to his left side 75% of the time when completing functional activities. Additional Short Term Goals?: Yes Short Term Goal 6: Patient will dress himself and bathe himself with min pa. Short Term Goal  7: Patient will decrease edema in left hand to minimal as he increases his functional use of his left hand.  Short Term Goal 8: Patient will use his  left hand as a gross assist Ily with all daily activities. Short Term Goal 9: Patient will be able to reach forward and grasp his toothbrush with his left hand with minimal facilitation at his left shoulder.  Long Term Goals Time to Complete Long Term Goals: 12 weeks Long Term Goal 1: Patient will return to [facility] host activities and be able to feed himself a biscuit using his left hand. Long Term Goal 2: Patient will increase AROM in his left shoulder to 90% for increased ablility to reach for objects when completing functional activiites.  Long Term Goal 3: Patient will increase left grip strength by 40 pounds and pich strength by 10  pounds for increased ability to open containers when working on his cars. Long Term Goal 4: Patient will improve fine motor coordination in order to complete Nine Hole Peg Test in 20' or less. Long Term Goal 5: Patient will attend to his left side 95% of the time when completing functional activities. Additional Long Term Goals?: Yes Long Term Goal 6: Patient will dress himself and bathe himself independently. Long Term Goal 7: Patient will improve to Brunnstrom stage V-VI in his left upper extremity in order to use his left arm as an active assist with all daily activiites.   Problem List Patient Active Problem List  Diagnosis  . Acute pancreatitis  . Cholelithiasis  . Hyperglycemia  . Current every day smoker  . Atrophy of right kidney  . Kidney calculi  . Hemiplegia, unspecified, affecting nondominant side  . Stented coronary artery  . Stroke, acute, embolic, right  . Seizure  . Cytotoxic cerebral edema  . Aortic thromboembolism  . Muscle weakness (generalized)  . Difficulty in walking  . Lack of coordination  . Left-sided neglect    End of Session Activity Tolerance: Patient tolerated treatment well General Behavior During Session: [facility] for tasks performed Cognition: [facility] for tasks performed  GO    [name],  OTR/L  06/03/2012, 3:06 PM

## 2012-06-03 NOTE — Progress Notes (Signed)
Physical Therapy Treatment Patient Details  Name: Shaun Pugh MRN: [id number] Date of Birth: [birth year]  Today's Date: 06/03/2012 Time: 4098-1191 PT Time Calculation (min): 39 min Charges: 15' NMR, 23' TE Visit#: 3  of 12   Re-eval: 06/30/12   Authorization: BCBS Medicare  Authorization Time Period:    Authorization Visit#: 3  of 12    Subjective: Symptoms/Limitations Symptoms: Pt reports that his L hip is going out on him occasionally.    Pain Assessment Currently in Pain?: Yes Pain Score:   4 Pain Location: Shoulder Pain Orientation: Left  Precautions/Restrictions     Exercise/Treatments Seated Other Seated Knee Exercises: Tall kneeling w/UE WB w/chair x15 Supine Bridges: 15 reps Straight Leg Raises: AROM;AAROM;Left;10 reps Sidelying Hip ABduction: AROM;AAROM;Left;10 reps Prone  Hamstring Curl: 10 reps;Limitations Hamstring Curl Limitations: w/prone prop on elbows Standing Tandem Stance: Eyes open;3 reps;30 secs;Limitations Tandem Stance Limitations: mod A BLE Sit to Stand: Standard surface;Limitations Sit to Stand Limitations: 10 reps Other Standing Exercises: Gait training w/shopping cart in hospital hallways x5 minutes     Manual Therapy Manual Therapy: Edema management Edema Management: Massage and AROM this date in left hand, patient's edema decreased enough to don edema glove. Educated patient on wear and care schedule of edema glove, cleaning, and contraindications.  Physical Therapy Assessment and Plan PT Assessment and Plan Clinical Impression Statement: Added activities to improve LE strength and improve balance. Pt has notable decrease in activity tolerance and requires rest break after 5 minutes of walking.  Stood on L side of pt for L side awareness, which required max cueing by end of treatment secondary to fatigue.  PT Plan: continue to improve LE strength and balance.  Add squats, stair training and progress to sled pushing.  Incorporate L UE WB  as able and cognitive challenges     Goals    Problem List Patient Active Problem List  Diagnosis  . Acute pancreatitis  . Cholelithiasis  . Hyperglycemia  . Current every day smoker  . Atrophy of right kidney  . Kidney calculi  . Hemiplegia, unspecified, affecting nondominant side  . Stented coronary artery  . Stroke, acute, embolic, right  . Seizure  . Cytotoxic cerebral edema  . Aortic thromboembolism  . Muscle weakness (generalized)  . Difficulty in walking  . Lack of coordination  . Left-sided neglect    General Behavior During Session: [facility] for tasks performed Cognition: [facility] for tasks performed  GP    Shaun Pugh 06/03/2012, 3:31 PM

## 2012-06-06 ENCOUNTER — Ambulatory Visit (HOSPITAL_COMMUNITY)
Admission: RE | Admit: 2012-06-06 | Discharge: 2012-06-06 | Disposition: A | Discharge status: Home / Self Care | Payer: Medicare Other | Source: Ambulatory Visit | Attending: Physical Medicine & Rehabilitation | Admitting: Physical Medicine & Rehabilitation

## 2012-06-06 ENCOUNTER — Encounter: Payer: Self-pay | Admitting: Physical Medicine & Rehabilitation

## 2012-06-06 ENCOUNTER — Ambulatory Visit (HOSPITAL_BASED_OUTPATIENT_CLINIC_OR_DEPARTMENT_OTHER): Payer: Medicare Other | Admitting: Physical Medicine & Rehabilitation

## 2012-06-06 ENCOUNTER — Encounter: Payer: Medicare Other | Attending: Physical Medicine & Rehabilitation

## 2012-06-06 VITALS — BP 133/74 | HR 82 | Resp 14 | Ht 73.0 in | Wt 236.0 lb

## 2012-06-06 DIAGNOSIS — R279 Unspecified lack of coordination: Secondary | ICD-10-CM

## 2012-06-06 DIAGNOSIS — G4730 Sleep apnea, unspecified: Secondary | ICD-10-CM | POA: Insufficient documentation

## 2012-06-06 DIAGNOSIS — I69919 Unspecified symptoms and signs involving cognitive functions following unspecified cerebrovascular disease: Secondary | ICD-10-CM | POA: Insufficient documentation

## 2012-06-06 DIAGNOSIS — I2510 Atherosclerotic heart disease of native coronary artery without angina pectoris: Secondary | ICD-10-CM | POA: Insufficient documentation

## 2012-06-06 DIAGNOSIS — R569 Unspecified convulsions: Secondary | ICD-10-CM

## 2012-06-06 DIAGNOSIS — I6340 Cerebral infarction due to embolism of unspecified cerebral artery: Secondary | ICD-10-CM

## 2012-06-06 DIAGNOSIS — R339 Retention of urine, unspecified: Secondary | ICD-10-CM | POA: Insufficient documentation

## 2012-06-06 DIAGNOSIS — S43002A Unspecified subluxation of left shoulder joint, initial encounter: Secondary | ICD-10-CM

## 2012-06-06 DIAGNOSIS — S43006A Unspecified dislocation of unspecified shoulder joint, initial encounter: Secondary | ICD-10-CM

## 2012-06-06 DIAGNOSIS — I69959 Hemiplegia and hemiparesis following unspecified cerebrovascular disease affecting unspecified side: Secondary | ICD-10-CM | POA: Insufficient documentation

## 2012-06-06 DIAGNOSIS — R414 Neurologic neglect syndrome: Secondary | ICD-10-CM

## 2012-06-06 DIAGNOSIS — I639 Cerebral infarction, unspecified: Secondary | ICD-10-CM

## 2012-06-06 NOTE — Progress Notes (Signed)
Occupational Therapy Treatment Patient Details  Name: Shaun Pugh MRN: [id number] Date of Birth: 12-05-1950  Today's Date: 06/06/2012 Time: 1350-1430 OT Time Calculation (min): 40 min Therex 4098-1191 25' NM re-ed 4782-9562 15'   Visit#: 4  of 36   Re-eval: 06/28/12    Authorization: Medicare - BCBS   Authorization Time Period: before 10th visit  Authorization Visit#: 4  of 10   Subjective Symptoms/Limitations Symptoms: S: I try to do my exercises when I get up and when I go to bed. Pain Assessment Currently in Pain?: Yes Pain Score:   4 Pain Location: Shoulder Pain Orientation: Left Pain Type: Acute pain  Precautions/Restrictions  Precautions Precautions: None  Exercise/Treatments Seated Horizontal ABduction: AAROM;10 reps External Rotation: AAROM;10 reps Internal Rotation: AAROM;10 reps Flexion: AAROM;10 reps Abduction: AAROM;10 reps Elbow Exercises Elbow Flexion: AROM;10 reps Elbow Extension: AROM;10 reps Forearm Supination: AROM;10 reps Forearm Pronation: AROM;10 reps Wrist Flexion: AROM;10 reps Wrist Extension: AROM;10 reps      Wrist Exercises Forearm Supination: AROM;10 reps Forearm Pronation: AROM;10 reps Wrist Flexion: AROM;10 reps Wrist Extension: AROM;10 reps   Hand Exercises MCPJ Flexion: PROM;5 reps MCPJ Extension: PROM;5 reps PIPJ Flexion: PROM;5 reps PIPJ Extension: PROM;5 reps DIPJ Flexion: PROM;5 reps DIPJ Extension: PROM;10 reps  Neurological Re-education Development of Reach  Reaching to Waist: stand at table; grasped Saebo balls from right crate and dropped in left crate. Facilitation at shoulder to depress; 12 balls completed  Occupational Therapy Assessment and Plan OT Assessment and Plan Clinical Impression Statement: A: Completed the clock drawing activity. Patient successfully included the "11, 12, 1, 2, 3, 4, 5, and 6". The rest of the numbers were not included on the left side.  OT Plan: P: perform electrical stimulation  to LUE to increase functional movement during daily tasks.   Goals Short Term Goals Time to Complete Short Term Goals: 6 weeks Short Term Goal 1: Patient will be educated on HEP. Short Term Goal 1 Progress: Progressing toward goal Short Term Goal 2: Patient will increase AROM in his left shoulder to 75% for increased ablility to reach for objects when completing functional activiites.  Short Term Goal 2 Progress: Progressing toward goal Short Term Goal 3: Patient will increase left grip strength by 10 pounds and pich strength by 5 pounds for increased ability to open containers when working on his cars. Short Term Goal 3 Progress: Progressing toward goal Short Term Goal 4: Patient will complete Nine Hole Peg Test in standardized fashion. Short Term Goal 4 Progress: Progressing toward goal Short Term Goal 5: Patient will attend to his left side 75% of the time when completing functional activities. Short Term Goal 5 Progress: Progressing toward goal Additional Short Term Goals?: Yes Short Term Goal 6: Patient will dress himself and bathe himself with min pa. Short Term Goal 6 Progress: Progressing toward goal Short Term Goal 7: Patient will decrease edema in left hand to minimal as he increases his functional use of his left hand.  Short Term Goal 7 Progress: Progressing toward goal Short Term Goal 8: Patient will use his left hand as a gross assist Ily with all daily activities. Short Term Goal 8 Progress: Progressing toward goal Short Term Goal 9: Patient will be able to reach forward and grasp his toothbrush with his left hand with minimal facilitation at his left shoulder.  Short Term Goal 9 Progress: Progressing toward goal Long Term Goals Time to Complete Long Term Goals: 12 weeks Long Term Goal 1: Patient will return  to [facility] host activities and be able to feed himself a biscuit using his left hand. Long Term Goal 1 Progress: Progressing toward goal Long Term Goal 2: Patient  will increase AROM in his left shoulder to 90% for increased ablility to reach for objects when completing functional activiites.  Long Term Goal 2 Progress: Progressing toward goal Long Term Goal 3: Patient will increase left grip strength by 40 pounds and pich strength by 10  pounds for increased ability to open containers when working on his cars. Long Term Goal 3 Progress: Progressing toward goal Long Term Goal 4: Patient will improve fine motor coordination in order to complete Nine Hole Peg Test in 45' or less. Long Term Goal 4 Progress: Progressing toward goal Long Term Goal 5: Patient will attend to his left side 95% of the time when completing functional activities. Long Term Goal 5 Progress: Progressing toward goal Additional Long Term Goals?: Yes Long Term Goal 6: Patient will dress himself and bathe himself independently. Long Term Goal 6 Progress: Progressing toward goal Long Term Goal 7: Patient will improve to Brunnstrom stage V-VI in his left upper extremity in order to use his left arm as an active assist with all daily activiites.  Long Term Goal 7 Progress: Progressing toward goal  Problem List Patient Active Problem List  Diagnosis  . Acute pancreatitis  . Cholelithiasis  . Hyperglycemia  . Current every day smoker  . Atrophy of right kidney  . Kidney calculi  . Hemiplegia, unspecified, affecting nondominant side  . Stented coronary artery  . Stroke, acute, embolic, right  . Seizure  . Cytotoxic cerebral edema  . Aortic thromboembolism  . Muscle weakness (generalized)  . Difficulty in walking  . Lack of coordination  . Left-sided neglect    End of Session Activity Tolerance: Patient tolerated treatment well General Behavior During Session: [facility] for tasks performed Cognition: [facility] for tasks performed   Limmie Patricia, OTR/L 06/06/2012, 3:12 PM

## 2012-06-06 NOTE — Progress Notes (Signed)
Subjective:    Patient ID: Shaun Pugh, male    DOB: 04-11-1951, 62 y.o.   MRN: [id number]  HPI 62 year old male with  history of coronary artery disease and TIA with left visual field  deficits who was admitted March 17, 2012, with headache, left-sided  weakness, numbness, and slurred speech. He was treated with tPA. CTA  head and neck showed hypodensity at right superior gyrus, ulcerated  plaque at right ICA with 60% ICA stenosis and suspicion of 10-mm focal  thrombus proximal aortic arch. The patient developed combativeness with  agitation as well as focal motor seizures of left upper extremity on  March 18, 2012. He was treated with Ativan and was started on Keppra  for treatment. Followup CT showed extensive area of low density  affecting cortical and subcortical right hemisphere in the MCA  distribution. 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 He was placed on aspirin and  no other anticoagulation due to large stroke and risk for bleeding  Was having emotional outbursts which wife thought was due to Keppra, reduced the dose from 120mg  BID and is now 250mg  BID with improvement in mood.      Pain Inventory Average Pain 0 Pain Right Now 4 My pain is dull  In the last 24 hours, has pain interfered with the following? General activity 0 Relation with others 0 Enjoyment of life 0 What TIME of day is your pain at its worst? varies Sleep (in general) Good  Pain is worse with: sitting Pain improves with: therapy/exercise Relief from Meds: n/a  Mobility use a cane ability to climb steps?  yes do you drive?  no  Function retired  Neuro/Psych trouble walking  Prior Studies Any changes since last visit?  no  Physicians involved in your care Any changes since last visit?  no   Family History  Problem Relation Age of Onset  . Anesthesia problems Neg Hx   . Hypotension Neg Hx   . Malignant hyperthermia  Neg Hx   . Pseudochol deficiency Neg Hx   . Congestive Heart Failure Mother   . Diabetes Father   . Heart attack Father    History   Social History  . Marital Status: Married    Spouse Name: N/A    Number of Children: N/A  . Years of Education: N/A   Social History Main Topics  . Smoking status: Former Smoker -- 1.0 packs/day for 45 years    Types: Cigarettes    Quit date: 03/18/2012  . Smokeless tobacco: Never Used  . Alcohol Use: No  . Drug Use: No  . Sexually Active: Yes    Birth Control/ Protection: None   Other Topics Concern  . None   Social History Narrative  . None   Past Surgical History  Procedure Date  . Back surgery     x 4   . Cholecystectomy 09/25/2011    Procedure: LAPAROSCOPIC CHOLECYSTECTOMY;  Surgeon: Fabio Bering, MD;  Location: [facility];  Service: General;  Laterality: N/A;   Past Medical History  Diagnosis Date  . High cholesterol   . Atrophy of right kidney 09/15/2011  . Kidney calculi 09/15/2011  . Acute pancreatitis 09/14/2011  . Cholelithiasis 09/14/2011  . Chronic back pain   . Sleep apnea     STOP BANG 5  . TIA (transient ischemic attack)   . CAD (coronary artery disease)   . Stented coronary artery approx 2004  .  Stroke    BP 133/74  Pulse 82  Resp 14  Ht 6\' 1"  (1.854 m)  Wt 236 lb (107.049 kg)  BMI 31.14 kg/m2  SpO2 95%     Review of Systems  All other systems reviewed and are negative.       Objective:   Physical Exam  Nursing note and vitals reviewed. Constitutional: He is oriented to person, place, and time. He appears well-developed and well-nourished.  Neck: Normal range of motion.  Neurological: He is alert and oriented to person, place, and time.  Psychiatric: He has a normal mood and affect.   Nursing note and vitals reviewed. Constitutional: He is oriented to person, place, and time. He appears well-developed.       obese  HENT:  Head: Normocephalic and atraumatic.  Eyes: Conjunctivae normal and EOM are  normal. Pupils are equal, round, and reactive to light.       Mild left neglect  Neck: Normal range of motion.  Neurological: He is alert and oriented to person, place, and time.Marland Kitchen He exhibits abnormal muscle tone. He displays no seizure activity. Coordination and gait abnormal.  Reflex Scores:      Tricep reflexes are 2+ on the right side and 3+ on the left side.      Bicep reflexes are 2+ on the right side and 3+ on the left side.      Brachioradialis reflexes are 2+ on the right side and 3+ on the left side.      Patellar reflexes are 2+ on the right side and 3+ on the left side.      Achilles reflexes are 2+ on the right side and 3+ on the left side.      Left upper extremity mild reduction Motor strength is 2 minus at the left deltoid 3 minus at the bicep 3 minus at the finger flexors and finger extensors 3 minus at the hip flexors and knee extensors 4 minus at the ankle dorsiflexor and plantar flexors Sedation intact to light touch Increased flexor tone in the left upper extremity Clonus at the finger flexors on the left side    Left shoulder has one finger breath subluxation. It reduces with upward pressure on the elbow. Pain with shoulder external rotation as well as for flexion and abduction.         Assessment & Plan:  1. Right MCA infarct with left spastic hemiparesis and left neglect progressing well with [facility] 2. Urinary retention resolved on flomax per urology 3.  Left shoulder subluxation recommend functional electrical stimulation as well as a Universal sling during ambulation 4. Post stroke seizure disorder. Recommend going up on the Keppra to at least 500 mg twice a day until neurology visit. Monitor for signs and symptoms of seizure and how to differentiate from spasticity

## 2012-06-06 NOTE — Progress Notes (Signed)
Physical Therapy Treatment Patient Details  Name: OSHEA PERCIVAL MRN: [id number] Date of Birth: Apr 07, 1951  Today's Date: 06/06/2012 Time: 4098-1191 PT Time Calculation (min): 45 min Visit#: 4  of 12   Re-eval: 06/30/12 Charges:   therex 34', gait 8' Authorization: BCBS Medicare  Authorization Visit#: 4  of 12    Subjective: Pain Assessment Currently in Pain?: Yes Pain Score:   4 Pain Location: Shoulder Pain Orientation: Left Pain Type: Acute pain  Precautions/Restrictions  Precautions Precautions: None  Exercise/Treatments Standing Heel Raises: 10 reps;Limitations Heel Raises Limitations: toe raises 10x  Lateral Step Up: Left;10 reps;Step Height: 4";Hand Hold: 1;Limitations Lateral Step Up Limitations: L UE only Forward Step Up: Left;10 reps;Step Height: 4";Hand Hold: 1;Limitations Forward Step Up Limitations: L UE only Functional Squat: 10 reps;Limitations Functional Squat Limitations: L UE assist Gait Training: down hospital hall with [facility] X 340' with CG assist Other Standing Knee Exercises: toe tapping with 4" step, alternating 10 reps Seated Other Seated Knee Exercises: tall kneel crawl fwd/bkwd 1X Supine Bridges: 2 sets;10 reps Straight Leg Raises: AROM;AAROM;Left;10 reps Prone  Hamstring Curl: 10 reps;Limitations Hamstring Curl Limitations: w/prone prop on elbows Hip Extension: 10 reps;Limitations Hip Extension Limitations: with knee bent and AA      Physical Therapy Assessment and Plan PT Assessment and Plan Clinical Impression Statement: added tall kneel crawling forward and backward with AA of shoes going backward; ambulated 340' with SPC and CG asssit.  Noted fatigue towards end of ambulation needing VC's to lift L toe up/not to shuffle feet.  Had pt use L UE/approximate as much as possible during activites.   PT Plan: continue to improve LE strength and balance;  progress to sled pushing.  Incorporate L UE WB as able and cognitive challenges       Problem List Patient Active Problem List  Diagnosis  . Acute pancreatitis  . Cholelithiasis  . Hyperglycemia  . Current every day smoker  . Atrophy of right kidney  . Kidney calculi  . Hemiplegia, unspecified, affecting nondominant side  . Stented coronary artery  . Stroke, acute, embolic, right  . Seizure  . Cytotoxic cerebral edema  . Aortic thromboembolism  . Muscle weakness (generalized)  . Difficulty in walking  . Lack of coordination  . Left-sided neglect    PT - End of Session Activity Tolerance: Patient tolerated treatment well General Behavior During Session: [facility] for tasks performed Cognition: [facility] for tasks performed   Lurena Nida, PTA/CLT 06/06/2012, 3:54 PM

## 2012-06-06 NOTE — Patient Instructions (Signed)
Recommend increasing Keppra to release 500 mg twice a day. We discussed signs and symptoms of seizures. You'll need to followup with neurology for any further reductions in dose. I am recommending a Universal sling. Your PT or OT continue for one. I would like you to wear this only when you're walking Am also recommending functional electrical stimulation for the left shoulder to reduce subluxation

## 2012-06-08 ENCOUNTER — Ambulatory Visit (HOSPITAL_COMMUNITY)
Admission: RE | Admit: 2012-06-08 | Discharge: 2012-06-08 | Disposition: A | Discharge status: Home / Self Care | Payer: Medicare Other | Source: Ambulatory Visit

## 2012-06-08 DIAGNOSIS — R414 Neurologic neglect syndrome: Secondary | ICD-10-CM

## 2012-06-08 DIAGNOSIS — R279 Unspecified lack of coordination: Secondary | ICD-10-CM

## 2012-06-08 NOTE — Progress Notes (Signed)
Occupational Therapy Treatment Patient Details  Name: Shaun Pugh MRN: [id number] Date of Birth: October 23, 1950  Today's Date: 06/08/2012 Time: 1355-1430 OT Time Calculation (min): 35 min   Visit#: 5  of 36   Re-eval: 06/28/12    Authorization: Medicare - BCBS   Authorization Time Period: before 10th visit  Authorization Visit#: 5  of 10   Subjective Symptoms/Limitations Symptoms: S: My shoulder hurts when I move it. It hurts in the bone. Pain Assessment Currently in Pain?: Yes Pain Score:   1 Pain Location: Arm Pain Orientation: Left Pain Type: Acute pain  Precautions/Restrictions   none  Exercise/Treatments Hand Exercises MCPJ Flexion: AROM;10 reps MCPJ Extension: AROM;10 reps PIPJ Flexion: AROM;10 reps PIPJ Extension: AROM;10 reps DIPJ Flexion: AROM;10 reps DIPJ Extension: AROM;10 reps Digit Abduction/Adduction: thumb; x10     Modalities Modalities: Archivist Stimulation Location: Area 1: Left shoulder (posterior deltoid and teres minor) Area 2: bicep belly  Area 3: over radial nerve in wrist  Electrical Stimulation Action: Area 1: To reduce pain associated with shoulder subluxation Area 2 & 3: for re-education of movement.  Electrical Stimulation Parameters: Cycle: 1:3 ; 35 Electrical Stimulation Goals: Pain;Neuromuscular facilitation Activities of Daily Living Activities of Daily Living: Patient donned button down shirt with Min Assist from therapist. max vc's for hemi-dressing technique.   Occupational Therapy Assessment and Plan OT Assessment and Plan Clinical Impression Statement: A: Tolerated E-Stim today. Increased pain during shoulder external rotation. OT Plan: P: Continue with E-Stim to decrease pain in shoulder subluxation and for re-education of movement.    Goals Short Term Goals Time to Complete Short Term Goals: 6 weeks Short Term Goal 1: Patient will be educated on HEP. Short Term Goal 2: Patient  will increase AROM in his left shoulder to 75% for increased ablility to reach for objects when completing functional activiites.  Short Term Goal 3: Patient will increase left grip strength by 10 pounds and pich strength by 5 pounds for increased ability to open containers when working on his cars. Short Term Goal 4: Patient will complete Nine Hole Peg Test in standardized fashion. Short Term Goal 5: Patient will attend to his left side 75% of the time when completing functional activities. Additional Short Term Goals?: Yes Short Term Goal 6: Patient will dress himself and bathe himself with min pa. Short Term Goal 7: Patient will decrease edema in left hand to minimal as he increases his functional use of his left hand.  Short Term Goal 8: Patient will use his left hand as a gross assist Ily with all daily activities. Short Term Goal 9: Patient will be able to reach forward and grasp his toothbrush with his left hand with minimal facilitation at his left shoulder.  Long Term Goals Time to Complete Long Term Goals: 12 weeks Long Term Goal 1: Patient will return to [facility] host activities and be able to feed himself a biscuit using his left hand. Long Term Goal 2: Patient will increase AROM in his left shoulder to 90% for increased ablility to reach for objects when completing functional activiites.  Long Term Goal 3: Patient will increase left grip strength by 40 pounds and pich strength by 10  pounds for increased ability to open containers when working on his cars. Long Term Goal 4: Patient will improve fine motor coordination in order to complete Nine Hole Peg Test in 29' or less. Long Term Goal 5: Patient will attend to his left side 95% of the  time when completing functional activities. Additional Long Term Goals?: Yes Long Term Goal 6: Patient will dress himself and bathe himself independently. Long Term Goal 7: Patient will improve to Brunnstrom stage V-VI in his left upper extremity in  order to use his left arm as an active assist with all daily activiites.   Problem List Patient Active Problem List  Diagnosis  . Acute pancreatitis  . Cholelithiasis  . Hyperglycemia  . Current every day smoker  . Atrophy of right kidney  . Kidney calculi  . Hemiplegia, unspecified, affecting nondominant side  . Stented coronary artery  . Stroke, acute, embolic, right  . Seizure  . Cytotoxic cerebral edema  . Aortic thromboembolism  . Muscle weakness (generalized)  . Difficulty in walking  . Lack of coordination  . Left-sided neglect    End of Session Activity Tolerance: Patient tolerated treatment well General Behavior During Session: [facility] for tasks performed Cognition: [facility] for tasks performed   Limmie Patricia, OTR/L 06/08/2012, 3:05 PM

## 2012-06-08 NOTE — Progress Notes (Signed)
Physical Therapy Treatment Patient Details  Name: Shaun Pugh MRN: [id number] Date of Birth: Feb 22, 1951  Today's Date: 06/08/2012 Time: 1435-1520 PT Time Calculation (min): 45 min  Visit#: 5  of 12   Re-eval: 06/30/12  Charge: Gait 15', therex 30'  Authorization: BCBS Medicare  Authorization Time Period:    Authorization Visit#: 5  of 12    Subjective: Symptoms/Limitations Symptoms: L UE pain scale 1/10.  Pt reported shopping in [employer] pushing cart and walking with QC. Pain Assessment Currently in Pain?: Yes Pain Score:   1 Pain Location: Arm Pain Orientation: Left Pain Type: Acute pain  Objective:   Exercise/Treatments Standing Heel Raises: 15 reps;Limitations Heel Raises Limitations: toe raises 15x  Lateral Step Up: Left;Step Height: 4";Hand Hold: 1;Limitations;15 reps Lateral Step Up Limitations: L UE only; hip hike 4" 10 reps Forward Step Up: Left;10 reps;Step Height: 4";Hand Hold: 1;Limitations Forward Step Up Limitations: L UE only Functional Squat: 15 reps Gait Training: down hospital hall with [facility] 2 sets X 340' with CG assist Other Standing Knee Exercises: toe tapping with 4" step, alternating 15 reps Seated Other Seated Knee Exercises: tall kneel crawl fwd/bkwd 1X with manual facilitation for L LE  Physical Therapy Assessment and Plan PT Assessment and Plan Clinical Impression Statement: Increased distance with gait training with SPC, pt with good sequencing with AD but did require cueing to lift L toe to reduce shuffling and 1 rest break required due to limited by fatigue.  Encouraged L UE/approximate with all therex.  Added hip hiking to therex for LE strengthening. PT Plan: continue to improve LE strength and balance; progress to sled pushing. Incorporate L UE WB as able and cognitive challenges    Goals    Problem List Patient Active Problem List  Diagnosis  . Acute pancreatitis  . Cholelithiasis  . Hyperglycemia  . Current every day smoker  .  Atrophy of right kidney  . Kidney calculi  . Hemiplegia, unspecified, affecting nondominant side  . Stented coronary artery  . Stroke, acute, embolic, right  . Seizure  . Cytotoxic cerebral edema  . Aortic thromboembolism  . Muscle weakness (generalized)  . Difficulty in walking  . Lack of coordination  . Left-sided neglect    PT - End of Session Equipment Utilized During Treatment: Gait belt Activity Tolerance: Patient tolerated treatment well;Patient limited by fatigue General Behavior During Session: [facility] for tasks performed Cognition: [facility] for tasks performed  GP    [name] 06/08/2012, 3:58 PM

## 2012-06-10 ENCOUNTER — Ambulatory Visit (HOSPITAL_COMMUNITY)
Admission: RE | Admit: 2012-06-10 | Discharge: 2012-06-10 | Disposition: A | Discharge status: Home / Self Care | Payer: Medicare Other | Source: Ambulatory Visit | Attending: Physical Medicine & Rehabilitation | Admitting: Physical Medicine & Rehabilitation

## 2012-06-10 DIAGNOSIS — R414 Neurologic neglect syndrome: Secondary | ICD-10-CM

## 2012-06-10 DIAGNOSIS — R279 Unspecified lack of coordination: Secondary | ICD-10-CM

## 2012-06-10 NOTE — Progress Notes (Signed)
Physical Therapy Treatment Patient Details  Name: Shaun Pugh MRN: [id number] Date of Birth: 02-Apr-1951  Today's Date: 06/10/2012 Time: 6213-0865 PT Time Calculation (min): 40 min  Visit#: 6  of 12   Re-eval: 06/30/12  Charge: Gait 8', Self Care (fall training) x 10', therex 22'  Authorization: BCBS Medicare  Authorization Time Period:    Authorization Visit#: 6  of 10    Subjective: Symptoms/Limitations Symptoms: Pt fatigued following OT prior PT this session.  No c/o pain today.  Pt reported going shopping in [place] [place] in [place], did report fearful of falling. Pain Assessment Currently in Pain?: No/denies  Objective:  Exercise/Treatments Standing Gait Training: down hospital hall with [facility] X 340' with CGA with head turns to improve L side neglect with PTA on L side Other Standing Knee Exercises: toe tapping with 4" step, alternating 15 reps Seated Other Seated Knee Exercises: tall kneel crawl fwd/bkwd 1X with manual facilitation for L LE; tall kneeling with L UE shoulder flexion/protract/retract x 15 Other Seated Knee Exercises: Fall training: half kneeling to floor; L UE A to quadruped-->tall kneel--> half kneel to standing with UEA on mat x 2   Weight Bearing Technique LUE Weight Bearing Technique: Other (comment) (seated working on weightshifting to left side of body NDT)  Physical Therapy Assessment and Plan PT Assessment and Plan Clinical Impression Statement: Fall training complete following discussion about fearful falling, pt able to demonstrate safe mechanics off of mat on floor with min assistance due to weakness of L extremities.  Pt with increased L side neglect this session due to fatigue from prior therapy session today, all activities complete with L UE and did require cueing for L side cervical rotation or neutral position with balance activities and gait training. PT Plan: Continue to improve LE strength and balance; progress to sled pushing.  Incorporate L UE WB as able and cognitive challenges    Goals    Problem List Patient Active Problem List  Diagnosis  . Acute pancreatitis  . Cholelithiasis  . Hyperglycemia  . Current every day smoker  . Atrophy of right kidney  . Kidney calculi  . Hemiplegia, unspecified, affecting nondominant side  . Stented coronary artery  . Stroke, acute, embolic, right  . Seizure  . Cytotoxic cerebral edema  . Aortic thromboembolism  . Muscle weakness (generalized)  . Difficulty in walking  . Lack of coordination  . Left-sided neglect    PT - End of Session Equipment Utilized During Treatment: Gait belt Activity Tolerance: Patient limited by fatigue General Behavior During Session: [facility] for tasks performed Cognition: [facility] for tasks performed  GP    [name] 06/10/2012, 4:33 PM

## 2012-06-10 NOTE — Progress Notes (Signed)
Occupational Therapy Treatment Patient Details  Name: Shaun Pugh MRN: [id number] Date of Birth: 1950-12-31  Today's Date: 06/10/2012 Time: 4098-1191 OT Time Calculation (min): 45 min Neuro Re ed 30' Cognitive Skills 15' Visit#: 6  of 36   Re-eval: 06/28/12    Authorization: Medicare Blue  Authorization Time Period: before 10th visit  Authorization Visit#: 6  of 10   Subjective S:  Ive been using the sling when I walk. (Patient with visibly less swelling in his left hand this date.)  Precautions/Restrictions   Left Side Neglect  Exercise/Treatments Cognitive Exercises Scanning: sitting on edge of mat, patient cued to look to left and pick out various items in the treatment gym and describe them.  Patient required constant vg to attend to the left side of his environment this date.  While weightbearing, I postioned a red, green, blue, and orange block on a table to his extreme left visual field.  I requested that he sort the remaining blocks.  He easily located the 2 blocks that were close to the right, however, the 2 blocks on the left patient required vg to attend to these blocks and sort accordingly.    Neurological Re-education Exercises    Weight Bearing Exercises Seated with weight on hand: seated edge of mat with min facilitation to place weight through his LUE at elbow and hand.  Completed a sorting activity to address left side neglect while weightbearing on his left hand.    Development of Reach  Development of Reach: Closed chain Closed Chain Exercises: with therapist providing input into his extended hand/wrist region         Weight Bearing Technique LUE Weight Bearing Technique: Other (comment) (seated working on weightshifting to left side of body using NDT facilitation techniques with moderate-max difficulty. Occupational Therapy Assessment and Plan OT Assessment and Plan Clinical Impression Statement: A:  Patient required almost constant vg to attend to his  left side/ look at his left arm or look at therapist to his left during todays treatment session.  OT Plan: P:   Increase awareness, attention to his left side by 10% for increased I and safety with ADL completion.     Goals Short Term Goals Time to Complete Short Term Goals: 6 weeks Short Term Goal 1: Patient will be educated on HEP. Short Term Goal 2: Patient will increase AROM in his left shoulder to 75% for increased ablility to reach for objects when completing functional activiites.  Short Term Goal 3: Patient will increase left grip strength by 10 pounds and pich strength by 5 pounds for increased ability to open containers when working on his cars. Short Term Goal 4: Patient will complete Nine Hole Peg Test in standardized fashion. Short Term Goal 5: Patient will attend to his left side 75% of the time when completing functional activities. Additional Short Term Goals?: Yes Short Term Goal 6: Patient will dress himself and bathe himself with min pa. Short Term Goal 7: Patient will decrease edema in left hand to minimal as he increases his functional use of his left hand.  Short Term Goal 8: Patient will use his left hand as a gross assist Ily with all daily activities. Short Term Goal 9: Patient will be able to reach forward and grasp his toothbrush with his left hand with minimal facilitation at his left shoulder.  Long Term Goals Time to Complete Long Term Goals: 12 weeks Long Term Goal 1: Patient will return to [facility] host activities and be  able to feed himself a biscuit using his left hand. Long Term Goal 2: Patient will increase AROM in his left shoulder to 90% for increased ablility to reach for objects when completing functional activiites.  Long Term Goal 3: Patient will increase left grip strength by 40 pounds and pich strength by 10  pounds for increased ability to open containers when working on his cars. Long Term Goal 4: Patient will improve fine motor coordination in  order to complete Nine Hole Peg Test in 58' or less. Long Term Goal 5: Patient will attend to his left side 95% of the time when completing functional activities. Additional Long Term Goals?: Yes Long Term Goal 6: Patient will dress himself and bathe himself independently. Long Term Goal 7: Patient will improve to Brunnstrom stage V-VI in his left upper extremity in order to use his left arm as an active assist with all daily activiites.   Problem List Patient Active Problem List  Diagnosis  . Acute pancreatitis  . Cholelithiasis  . Hyperglycemia  . Current every day smoker  . Atrophy of right kidney  . Kidney calculi  . Hemiplegia, unspecified, affecting nondominant side  . Stented coronary artery  . Stroke, acute, embolic, right  . Seizure  . Cytotoxic cerebral edema  . Aortic thromboembolism  . Muscle weakness (generalized)  . Difficulty in walking  . Lack of coordination  . Left-sided neglect    End of Session Activity Tolerance: Patient tolerated treatment well General Behavior During Session: [facility] for tasks performed Cognition: [facility] for tasks performed  GO    [name], OTR/L  06/10/2012, 4:09 PM

## 2012-06-13 ENCOUNTER — Ambulatory Visit (HOSPITAL_COMMUNITY)
Admission: RE | Admit: 2012-06-13 | Discharge: 2012-06-13 | Disposition: A | Discharge status: Home / Self Care | Payer: Medicare Other | Source: Ambulatory Visit | Attending: Physical Medicine & Rehabilitation | Admitting: Physical Medicine & Rehabilitation

## 2012-06-13 DIAGNOSIS — R279 Unspecified lack of coordination: Secondary | ICD-10-CM | POA: Insufficient documentation

## 2012-06-13 DIAGNOSIS — R262 Difficulty in walking, not elsewhere classified: Secondary | ICD-10-CM | POA: Insufficient documentation

## 2012-06-13 DIAGNOSIS — M6281 Muscle weakness (generalized): Secondary | ICD-10-CM | POA: Insufficient documentation

## 2012-06-13 DIAGNOSIS — R414 Neurologic neglect syndrome: Secondary | ICD-10-CM

## 2012-06-13 DIAGNOSIS — IMO0001 Reserved for inherently not codable concepts without codable children: Secondary | ICD-10-CM | POA: Insufficient documentation

## 2012-06-13 NOTE — Progress Notes (Signed)
Occupational Therapy Treatment Patient Details  Name: Shaun Pugh MRN: [id number] Date of Birth: Apr 24, 1951  Today's Date: 06/13/2012 Time: 4098-1191 OT Time Calculation (min): 40 min Manual Therapy 4782-9562 10' Neuro reeducation [id number] 30' Visit#: 7  of 36   Re-eval: 06/28/12    Authorization: Medicare Blue   Authorization Time Period: before 10th visit   Authorization Visit#: 7  of 10   Subjective  S:  My thumb really hurts.   Pain Assessment Currently in Pain?: Yes Pain Location: Hand Pain Orientation: Left Pain Type: Acute pain  Precautions/Restrictions   left side neglect  Exercise/Treatments Neurological Re-education Exercises Shoulder Flexion: Supine;AAROM;10 reps Shoulder ABduction: Supine;AAROM;10 reps Shoulder Protraction: Supine;AAROM;10 reps Shoulder Horizontal ABduction: Supine;AAROM;10 reps Shoulder External Rotation: Supine;PROM;10 reps Shoulder Internal Rotation: Supine;AROM;10 reps Elbow Flexion: Supine;AROM;10 reps Elbow Extension: Supine;AROM;10 reps Forearm Supination: Supine;AROM;10 reps Forearm Pronation: Supine;AROM;10 reps Wrist Flexion: Supine;AROM;10 reps Wrist Extension: Supine;AROM;10 reps     Development of Reach  Development of Reach: Weighted closed chain Weighted Closed Chain Exercises: therapist providing resistance into patients palm to promote reaching as he reached forward x 15 reps Saebo Supination: Sat with Saebo 5 ball peg activity positioned in front of patient.  With min facilitation at wrist and elbow to promote reaching patient picked up balls from peg end and moved to second tray.  Completed 5 reps x 2 sets.  We then transitioned to picking up from the ball end using a spherical and cylindrical grasp pattern on ball with min facilitation at elbow and wrist to unweight arm and allow for fluid movement.  Lastly, positioned one crate on vertical vs horizontal plane and patient retrieved ball, manipulated position of peg to  place into flat tray and placed with min facilitation at peg and at wrist to unweight arm.  Required min-mod facilitation throughout entire reaching activity to maintain equal weight displacement between right and left pelvic region.    Grasp and Release Digit Abduction/Adduction: opposition of thumb to index, long, ring, small digits x 1 each with min-mod difficulty        Manual Therapy Manual Therapy: Myofascial release Myofascial Release: MFR to MCPJ of left thumb and associated areas to decrease pain with active movement of thumb.  Good release felt several times during MFR.  Occupational Therapy Assessment and Plan OT Assessment and Plan Clinical Impression Statement: A:  Patient requires min facilitation to unweight his left arm during reaching activities in order to fully flex, extend his elbow and flex his shoulder to complete reaching pattern.  OT Plan: P:  Decrease amount of facilitation required to extend elbow, wrist during reaching activity.   Goals Short Term Goals Time to Complete Short Term Goals: 6 weeks Short Term Goal 1: Patient will be educated on HEP. Short Term Goal 1 Progress: Progressing toward goal Short Term Goal 2: Patient will increase AROM in his left shoulder to 75% for increased ablility to reach for objects when completing functional activiites.  Short Term Goal 2 Progress: Progressing toward goal Short Term Goal 3: Patient will increase left grip strength by 10 pounds and pich strength by 5 pounds for increased ability to open containers when working on his cars. Short Term Goal 3 Progress: Progressing toward goal Short Term Goal 4: Patient will complete Nine Hole Peg Test in standardized fashion. Short Term Goal 4 Progress: Progressing toward goal Short Term Goal 5: Patient will attend to his left side 75% of the time when completing functional activities. Short Term Goal 5 Progress:  Progressing toward goal Additional Short Term Goals?: Yes Short Term  Goal 6: Patient will dress himself and bathe himself with min pa. Short Term Goal 6 Progress: Progressing toward goal Short Term Goal 7: Patient will decrease edema in left hand to minimal as he increases his functional use of his left hand.  Short Term Goal 7 Progress: Progressing toward goal Short Term Goal 8: Patient will use his left hand as a gross assist Ily with all daily activities. Short Term Goal 8 Progress: Progressing toward goal Short Term Goal 9: Patient will be able to reach forward and grasp his toothbrush with his left hand with minimal facilitation at his left shoulder.  Short Term Goal 9 Progress: Progressing toward goal Long Term Goals Time to Complete Long Term Goals: 12 weeks Long Term Goal 1: Patient will return to [facility] host activities and be able to feed himself a biscuit using his left hand. Long Term Goal 1 Progress: Progressing toward goal Long Term Goal 2: Patient will increase AROM in his left shoulder to 90% for increased ablility to reach for objects when completing functional activiites.  Long Term Goal 2 Progress: Progressing toward goal Long Term Goal 3: Patient will increase left grip strength by 40 pounds and pich strength by 10  pounds for increased ability to open containers when working on his cars. Long Term Goal 3 Progress: Progressing toward goal Long Term Goal 4: Patient will improve fine motor coordination in order to complete Nine Hole Peg Test in 45' or less. Long Term Goal 4 Progress: Progressing toward goal Long Term Goal 5: Patient will attend to his left side 95% of the time when completing functional activities. Long Term Goal 5 Progress: Progressing toward goal Additional Long Term Goals?: Yes Long Term Goal 6: Patient will dress himself and bathe himself independently. Long Term Goal 6 Progress: Progressing toward goal Long Term Goal 7: Patient will improve to Brunnstrom stage V-VI in his left upper extremity in order to use his left  arm as an active assist with all daily activiites.  Long Term Goal 7 Progress: Progressing toward goal  Problem List Patient Active Problem List  Diagnosis  . Acute pancreatitis  . Cholelithiasis  . Hyperglycemia  . Current every day smoker  . Atrophy of right kidney  . Kidney calculi  . Hemiplegia, unspecified, affecting nondominant side  . Stented coronary artery  . Stroke, acute, embolic, right  . Seizure  . Cytotoxic cerebral edema  . Aortic thromboembolism  . Muscle weakness (generalized)  . Difficulty in walking  . Lack of coordination  . Left-sided neglect    End of Session Activity Tolerance: Patient tolerated treatment well General Behavior During Session: [facility] for tasks performed Cognition: [facility] for tasks performed   [name], OTR/L 06/13/2012, 3:39 PM

## 2012-06-13 NOTE — Progress Notes (Signed)
Physical Therapy Treatment Patient Details  Name: Shaun Pugh MRN: [id number] Date of Birth: 1950-09-10  Today's Date: 06/13/2012 Time: 7829-5621 PT Time Calculation (min): 44 min Visit#: 7  of 12   Re-eval: 06/30/12 Authorization: [employer] Medicare  Authorization Visit#: 7  of 10   Charges:  therex 40'  Subjective: Symptoms/Limitations Symptoms: Pt states he had a good weekend.  No pain reported.  States he was not as active over the weekend. Pain Assessment Currently in Pain?: No/denies   Exercise/Treatments Standing Heel Raises: 15 reps;Limitations Heel Raises Limitations: toe raises 15x  Lateral Step Up: Left;Step Height: 4";Hand Hold: 1;Limitations;15 reps Lateral Step Up Limitations: L UE only; Functional Squat: 15 reps Stairs: staircase in dept 3RT, reciprocally except deeper step side with step to gait. Rocker Board: 2 minutes SLS: L:  6" R:  14" max X 5 each Other Standing Knee Exercises: toe tapping with 4" step, alternating 15 reps Other Standing Knee Exercises: retro gait, side step, tandem 2RT each    Physical Therapy Assessment and Plan PT Assessment and Plan Clinical Impression Statement: Pt. continues to require frequent rest breaks with activity.  Added gait/balance activities to increase stability and lessen fear of falling.  Improved ability to use L UE today with activity with less cues. PT Plan: Continue to improve LE strength and balance; progress to sled pushing. Incorporate L UE WB as able and cognitive challenges     Problem List Patient Active Problem List  Diagnosis  . Acute pancreatitis  . Cholelithiasis  . Hyperglycemia  . Current every day smoker  . Atrophy of right kidney  . Kidney calculi  . Hemiplegia, unspecified, affecting nondominant side  . Stented coronary artery  . Stroke, acute, embolic, right  . Seizure  . Cytotoxic cerebral edema  . Aortic thromboembolism  . Muscle weakness (generalized)  . Difficulty in walking  . Lack of  coordination  . Left-sided neglect    PT - End of Session Equipment Utilized During Treatment: Gait belt Activity Tolerance: Patient limited by fatigue General Behavior During Session: [facility] for tasks performed Cognition: [facility] for tasks performed   Lurena Nida, PTA/CLT 06/13/2012, 2:36 PM

## 2012-06-15 ENCOUNTER — Ambulatory Visit (HOSPITAL_COMMUNITY)
Admission: RE | Admit: 2012-06-15 | Discharge: 2012-06-15 | Disposition: A | Discharge status: Home / Self Care | Payer: Medicare Other | Source: Ambulatory Visit | Attending: Physical Medicine & Rehabilitation | Admitting: Physical Medicine & Rehabilitation

## 2012-06-15 DIAGNOSIS — R414 Neurologic neglect syndrome: Secondary | ICD-10-CM

## 2012-06-15 DIAGNOSIS — R279 Unspecified lack of coordination: Secondary | ICD-10-CM

## 2012-06-15 NOTE — Progress Notes (Signed)
Physical Therapy Treatment Patient Details  Name: KRISTOFER SCHAFFERT MRN: [id number] Date of Birth: 1950/05/31  Today's Date: 06/15/2012 Time: 4098-1191 PT Time Calculation (min): 43 min Charges: 34' TE Visit#: 8  of 12   Re-eval: 06/30/12    Authorization: [employer] Medicare  Authorization Time Period:    Authorization Visit#: 8  of 10    Subjective: Symptoms/Limitations Symptoms: I pushed the grocery cart for 20-30 min in [place] yesterday. I kept running into the clothes racks on the L side.  Pain Assessment Currently in Pain?: No/denies Pain Score:   5 Pain Location:  (THUMB)  Precautions/Restrictions     Exercise/Treatments Standing Other Standing Knee Exercises: Sled Pushes 3 RT Seated Long Arc Quad: Left;10 reps (5 sec hold) Supine Short Arc Quad Sets: Left;10 reps;Limitations Short Arc Quad Sets Limitations: 5 sec holds 2# weight Straight Leg Raises: Left;10 reps Prone  Hamstring Curl: 10 reps;Limitations Hamstring Curl Limitations: POE w/support of L shoulder Hip Extension: 5 reps Other Prone Exercises: PF contractions 2x10 sec holds Sidelying ABduction: AAROM;10 reps Other Sidelying Exercises: adduction: AROM manual resistance x10 Other Sidelying Exercises: Scap Retraction x15 w/TC and facilitation    Physical Therapy Assessment and Plan PT Assessment and Plan Clinical Impression Statement: Pt continues to be limited by hip abd strength and coordination.  Added core and UE stabilization activities to improve posture and decrease shoulder pain and continues to require max A to prop on L UE due to pain. Encouraged pt to continue with ambulating in safe environments.   PT Plan: Continue to improve LE strength and balance; progress to sled pushing. Incorporate L UE WB as able and cognitive challenges    Goals    Problem List Patient Active Problem List  Diagnosis  . Acute pancreatitis  . Cholelithiasis  . Hyperglycemia  . Current every day smoker  . Atrophy of  right kidney  . Kidney calculi  . Hemiplegia, unspecified, affecting nondominant side  . Stented coronary artery  . Stroke, acute, embolic, right  . Seizure  . Cytotoxic cerebral edema  . Aortic thromboembolism  . Muscle weakness (generalized)  . Difficulty in walking  . Lack of coordination  . Left-sided neglect    PT - End of Session Equipment Utilized During Treatment: Gait belt Activity Tolerance: Patient limited by fatigue General Behavior During Session: [facility] for tasks performed Cognition: [employer] for tasks performed  Rakia Frayne, PT 06/15/2012, 5:25 PM

## 2012-06-15 NOTE — Progress Notes (Signed)
Occupational Therapy Treatment Patient Details  Name: Shaun Pugh MRN: [id number] Date of Birth: [birth year]  Today's Date: 06/15/2012 Time: 1430-1530 OT Time Calculation (min): 60 min Self care/ADL 1430-1530 60'  Visit#: 8  of 36   Re-eval: 06/28/12    Authorization: Medicare Blue   Authorization Time Period: before 10th visit   Authorization Visit#: 8  of 10   Subjective Symptoms/Limitations Symptoms: S: My butterfly is going to make me spill everything. Pain Assessment Currently in Pain?: No/denies Pain Score:   5 Pain Location:  (THUMB)  Precautions/Restrictions   None  Exercise/Treatments    Activities of Daily Living Activities of Daily Living: Patient completed simple meal prep activity in ADL kitchen preparing donuts. Patient used left UE as stabilizer of mixing bowl and performed lateral pinch when picking up measuring cups with hand over hand assist from therapist. Therapist placed bottle cap on dough and patient used left hand to push cap down to create the donut hole.Patient was encouraged during task to use left hand as much as possible. Patient did require mod vc's to monitor donuts in hot oil as he left them in too long they overcooked. Overall, patient enjoyed activity.   Occupational Therapy Assessment and Plan OT Assessment and Plan Clinical Impression Statement: A: Patient enjoyed donut making activity. Required mod-max vc's to use left hand during functional tasks before resorting to using right hand.  OT Plan: P:  Decrease amount of facilitation required to extend elbow, wrist during reaching activity. Future meal prep idea: chocolate cake.   Goals Short Term Goals Time to Complete Short Term Goals: 6 weeks Short Term Goal 1: Patient will be educated on HEP. Short Term Goal 2: Patient will increase AROM in his left shoulder to 75% for increased ablility to reach for objects when completing functional activiites.  Short Term Goal 3: Patient will increase  left grip strength by 10 pounds and pich strength by 5 pounds for increased ability to open containers when working on his cars. Short Term Goal 4: Patient will complete Nine Hole Peg Test in standardized fashion. Short Term Goal 5: Patient will attend to his left side 75% of the time when completing functional activities. Additional Short Term Goals?: Yes Short Term Goal 6: Patient will dress himself and bathe himself with min pa. Short Term Goal 7: Patient will decrease edema in left hand to minimal as he increases his functional use of his left hand.  Short Term Goal 8: Patient will use his left hand as a gross assist Ily with all daily activities. Short Term Goal 9: Patient will be able to reach forward and grasp his toothbrush with his left hand with minimal facilitation at his left shoulder.  Long Term Goals Time to Complete Long Term Goals: 12 weeks Long Term Goal 1: Patient will return to [facility] host activities and be able to feed himself a biscuit using his left hand. Long Term Goal 2: Patient will increase AROM in his left shoulder to 90% for increased ablility to reach for objects when completing functional activiites.  Long Term Goal 3: Patient will increase left grip strength by 40 pounds and pich strength by 10  pounds for increased ability to open containers when working on his cars. Long Term Goal 4: Patient will improve fine motor coordination in order to complete Nine Hole Peg Test in 89' or less. Long Term Goal 5: Patient will attend to his left side 95% of the time when completing functional activities. Additional Long  Term Goals?: Yes Long Term Goal 6: Patient will dress himself and bathe himself independently. Long Term Goal 7: Patient will improve to Brunnstrom stage V-VI in his left upper extremity in order to use his left arm as an active assist with all daily activiites.   Problem List Patient Active Problem List  Diagnosis  . Acute pancreatitis  . Cholelithiasis   . Hyperglycemia  . Current every day smoker  . Atrophy of right kidney  . Kidney calculi  . Hemiplegia, unspecified, affecting nondominant side  . Stented coronary artery  . Stroke, acute, embolic, right  . Seizure  . Cytotoxic cerebral edema  . Aortic thromboembolism  . Muscle weakness (generalized)  . Difficulty in walking  . Lack of coordination  . Left-sided neglect    End of Session Activity Tolerance: Patient tolerated treatment well General Behavior During Session: [facility] for tasks performed Cognition: [facility] for tasks performed   Limmie Patricia, OTR/L 06/15/2012, 4:26 PM

## 2012-06-17 ENCOUNTER — Ambulatory Visit (HOSPITAL_COMMUNITY)
Admission: RE | Admit: 2012-06-17 | Discharge: 2012-06-17 | Disposition: A | Discharge status: Home / Self Care | Payer: Medicare Other | Source: Ambulatory Visit | Attending: Physical Medicine & Rehabilitation | Admitting: Physical Medicine & Rehabilitation

## 2012-06-17 DIAGNOSIS — R279 Unspecified lack of coordination: Secondary | ICD-10-CM

## 2012-06-17 DIAGNOSIS — R414 Neurologic neglect syndrome: Secondary | ICD-10-CM

## 2012-06-17 NOTE — Progress Notes (Signed)
Occupational Therapy Treatment Patient Details  Name: Shaun Pugh MRN: [id number] Date of Birth: [birth year]  Today's Date: 06/17/2012 Time: 1100-1200 OT Time Calculation (min): 60 min Neuro reeducation 42' Visit#: 9  of 36   Re-eval: 06/28/12 (update g code on 06/20/12)    Authorization: Medicare Blue   Authorization Time Period: before 10th visit   Authorization Visit#: 9  of 36   Subjective S:  I cant put my socks on, because I can't hold onto the sock.   Pain Assessment Currently in Pain?: No/denies Pain Score: 0-No pain Pain Location: Shoulder Pain Orientation: Left  Precautions/Restrictions   left side neglect  Exercise/Treatments Neurological Re-education Exercises Shoulder Flexion: Supine;AAROM;10 reps Shoulder ABduction: Supine;AAROM;10 reps Shoulder Protraction: Supine;AAROM;10 reps Shoulder Horizontal ABduction: Supine;AAROM;10 reps Shoulder External Rotation: Supine;PROM;AAROM;10 reps Shoulder Internal Rotation: Supine;AAROM;10 reps Elbow Flexion: Supine;AROM;10 reps Elbow Extension: Supine;AROM;10 reps Forearm Supination: Supine;AROM;10 reps Forearm Pronation: Supine;AROM;10 reps Wrist Flexion: Supine;AROM;10 reps Wrist Extension: Supine;AROM;10 reps Wrist Radial Deviation:  (gross grasp and release of hand x 10 reps)      Development of Reach  Hand over Hand Assistance while Reaching: in supine reaching towards ceiling x 10 reps with min-mod facilitation at wrist to counter weight arm to various degrees  Saebo Ring Tree: sat at table and took rings from base to yellow, to red, to green, to blue and then reversed with min facilitation to grasp balls/rings and min-mod facilitation to reach to levels of tree.   Saebo Supination: completed 2 sets of supination to red and yellow level of tree.          Activities of Daily Living Activities of Daily Living: ADL:  able to place 2 pillow in pillowcases with mod facilitation and min-mod vg to use his left  hand  Occupational Therapy Assessment and Plan OT Assessment and Plan Clinical Impression Statement: A:  Able to maintain grasp on Saebo balls with min-mod difficulty.  Able to attend to left side of Saebo tree without cuing to do so. OT Plan: P:  Reassess for G code update, attempt to don socks with LUE and sock aid.   Goals Short Term Goals Time to Complete Short Term Goals: 6 weeks Short Term Goal 1: Patient will be educated on HEP. Short Term Goal 2: Patient will increase AROM in his left shoulder to 75% for increased ablility to reach for objects when completing functional activiites.  Short Term Goal 3: Patient will increase left grip strength by 10 pounds and pich strength by 5 pounds for increased ability to open containers when working on his cars. Short Term Goal 4: Patient will complete Nine Hole Peg Test in standardized fashion. Short Term Goal 5: Patient will attend to his left side 75% of the time when completing functional activities. Additional Short Term Goals?: Yes Short Term Goal 6: Patient will dress himself and bathe himself with min pa. Short Term Goal 7: Patient will decrease edema in left hand to minimal as he increases his functional use of his left hand.  Short Term Goal 8: Patient will use his left hand as a gross assist Ily with all daily activities. Short Term Goal 9: Patient will be able to reach forward and grasp his toothbrush with his left hand with minimal facilitation at his left shoulder.  Long Term Goals Time to Complete Long Term Goals: 12 weeks Long Term Goal 1: Patient will return to [facility] host activities and be able to feed himself a biscuit using his left hand.  Long Term Goal 2: Patient will increase AROM in his left shoulder to 90% for increased ablility to reach for objects when completing functional activiites.  Long Term Goal 3: Patient will increase left grip strength by 40 pounds and pich strength by 10  pounds for increased ability to open  containers when working on his cars. Long Term Goal 4: Patient will improve fine motor coordination in order to complete Nine Hole Peg Test in 63' or less. Long Term Goal 5: Patient will attend to his left side 95% of the time when completing functional activities. Additional Long Term Goals?: Yes Long Term Goal 6: Patient will dress himself and bathe himself independently. Long Term Goal 7: Patient will improve to Brunnstrom stage V-VI in his left upper extremity in order to use his left arm as an active assist with all daily activiites.   Problem List Patient Active Problem List  Diagnosis  . Acute pancreatitis  . Cholelithiasis  . Hyperglycemia  . Current every day smoker  . Atrophy of right kidney  . Kidney calculi  . Hemiplegia, unspecified, affecting nondominant side  . Stented coronary artery  . Stroke, acute, embolic, right  . Seizure  . Cytotoxic cerebral edema  . Aortic thromboembolism  . Muscle weakness (generalized)  . Difficulty in walking  . Lack of coordination  . Left-sided neglect    End of Session Activity Tolerance: Patient tolerated treatment well General Behavior During Session: [facility] for tasks performed Cognition: [facility] for tasks performed  GO    [name], OTR/L  06/17/2012, 12:15 PM

## 2012-06-17 NOTE — Progress Notes (Signed)
Physical Therapy Treatment Patient Details  Name: Shaun Pugh MRN: [id number] Date of Birth: Feb 04, 1951  Today's Date: 06/17/2012 Time: 1016-1101 PT Time Calculation (min): 45 min Charges: 30' TE, 15' NMR Visit#: 9  of 12   Re-eval: 06/30/12    Authorization: BCBS Medicare  Authorization Time Period:    Authorization Visit#: 9  of 10    Subjective: Symptoms/Limitations Symptoms: Pt states that is sore all over from the exercises last time.  Continues to walk a little without his cane.  Pain Assessment Currently in Pain?: Yes Pain Score:   3 Pain Location: Shoulder Pain Orientation: Left  Precautions/Restrictions     Exercise/Treatments Mobility/Balance        Stretches   Aerobic Tread Mill: BSW TM Gait trainer 0.40 cyc/sec x6 min w/max cueing for L LE step and posture Machines for Strengthening Cybex Knee Extension: LLE only 1 PL x20 Cybex Knee Flexion: LLE only 3.5 PL Plyometrics   Standing   Seated Other Seated Knee Exercises: hip adduction w/small green ball 10x10 sec holds Supine   Sidelying   Prone         Physical Therapy Assessment and Plan PT Assessment and Plan Clinical Impression Statement: Added BWS TM to improve gait speed and posture with gait.  Required significant cueing for gait mechanics including L stride length and to decrease scissoring gait.  PT Plan: Continue to improve LE strength and balance; progress to sled pushing. Incorporate L UE WB as able and cognitive challenges    Goals    Problem List Patient Active Problem List  Diagnosis  . Acute pancreatitis  . Cholelithiasis  . Hyperglycemia  . Current every day smoker  . Atrophy of right kidney  . Kidney calculi  . Hemiplegia, unspecified, affecting nondominant side  . Stented coronary artery  . Stroke, acute, embolic, right  . Seizure  . Cytotoxic cerebral edema  . Aortic thromboembolism  . Muscle weakness (generalized)  . Difficulty in walking  . Lack of  coordination  . Left-sided neglect    PT - End of Session Equipment Utilized During Treatment: Gait belt Activity Tolerance: Patient limited by fatigue General Behavior During Session: [facility] for tasks performed Cognition: [facility] for tasks performed PT Plan of Care PT Patient Instructions: Encouraged to continue with ambulating with improved speed.   GP    Shaun Pugh 06/17/2012, 11:05 AM

## 2012-06-20 ENCOUNTER — Ambulatory Visit (HOSPITAL_COMMUNITY): Payer: Medicare Other

## 2012-06-22 ENCOUNTER — Ambulatory Visit (HOSPITAL_COMMUNITY)
Admission: RE | Admit: 2012-06-22 | Discharge: 2012-06-22 | Disposition: A | Discharge status: Home / Self Care | Payer: Medicare Other | Source: Ambulatory Visit | Attending: Physical Medicine & Rehabilitation | Admitting: Physical Medicine & Rehabilitation

## 2012-06-22 DIAGNOSIS — R279 Unspecified lack of coordination: Secondary | ICD-10-CM

## 2012-06-22 DIAGNOSIS — R414 Neurologic neglect syndrome: Secondary | ICD-10-CM

## 2012-06-22 NOTE — Progress Notes (Addendum)
Physical Therapy Treatment Patient Details  Name: Shaun Pugh MRN: [id number] Date of Birth: [birth year]  Today's Date: 06/22/2012 Time: 1355-1435 PT Time Calculation (min): 40 min  Visit#: 10 of 12  Re-eval: 06/30/12  Charge:  Gait 8', therex 30'  Authorization Visit#: 10 of 20   Subjective: Symptoms/Limitations Symptoms: Pt stated no real pain but L UE very tired following occupational therapy. Pain Assessment Currently in Pain?: Yes Pain Score:   1 Pain Location: Wrist Pain Orientation: Left Pain Type: Acute pain  Objective:   Exercise/Treatments Aerobic Tread Mill: BSW TM Gait trainer 0.42 cyc/sec x8 min w/max cueing for L LE step and posture Machines for Strengthening Cybex Knee Extension: L LE only 1PL 2x 15 Cybex Knee Flexion: LLE only 3.5 PL 2x 15 Cybex Leg Press: 3.5 Pl 2x 10 Standing Other Standing Knee Exercises: Sled Push/pull 3 RT  Physical Therapy Assessment and Plan PT Assessment and Plan Clinical Impression Statement: Continued gait training with BWS to improve gait mechanics with cueing to increase L LE stride length, speed and posture.  Continued with L LE strengthening and activities to incorporate L UE.  Pt limited by fatigue with activites. PT Plan: Continue to improve LE strength and balance, incorporate L UE WB as able and cognitive challenges.    Goals    Problem List Patient Active Problem List  Diagnosis  . Acute pancreatitis  . Cholelithiasis  . Hyperglycemia  . Current every day smoker  . Atrophy of right kidney  . Kidney calculi  . Hemiplegia, unspecified, affecting nondominant side  . Stented coronary artery  . Stroke, acute, embolic, right  . Seizure  . Cytotoxic cerebral edema  . Aortic thromboembolism  . Muscle weakness (generalized)  . Difficulty in walking  . Lack of coordination  . Left-sided neglect    PT - End of Session Equipment Utilized During Treatment: Gait belt Activity Tolerance: Patient limited by  fatigue General Behavior During Session: Shaun Pugh for tasks performed Cognition: Shaun Pugh for tasks performed  GP Functional Assessment Tool Used: ABC: 52.5% Functional Limitation: Mobility: Walking and moving around Mobility: Walking and Moving Around Current Status ([id number]): At least 40 percent but less than 60 percent impaired, limited or restricted Mobility: Walking and Moving Around Goal Status [phone]): At least 1 percent but less than 20 percent impaired, limited or restricted  Shaun Pugh 06/22/2012, 3:16 PM

## 2012-06-22 NOTE — Progress Notes (Signed)
Occupational Therapy Treatment Patient Details  Name: Shaun Pugh MRN: [id number] Date of Birth: 31-Oct-1950  Today's Date: 06/22/2012 Time: 1305-1350 OT Time Calculation (min): 45 min NM re-ed 1305-1350 45'  Visit#: 10 of 36  Re-eval: 06/28/12 (update g code on 06/20/12)    Authorization: Medicare Blue   Authorization Time Period: before 20th visit   Authorization Visit#: 10 of 36  Subjective Symptoms/Limitations Symptoms: S: I don't know where my edema glove is. It might be by the recliner. Pain Assessment Currently in Pain?: Yes Pain Score:   1 Pain Location: Wrist Pain Orientation: Left Pain Type: Acute pain  Precautions/Restrictions   None  Exercise/Treatments Supine Flexion: PROM;AAROM;10 reps;Limitations (verbal cues with physical assist) ABduction: PROM;10 reps;Limitations (increased pain with movement)  Elbow Exercises Elbow Flexion: Supine;PROM;AROM;10 reps Elbow Extension: Supine;AROM;10 reps Forearm Supination: Supine;PROM;10 reps Forearm Pronation: Supine;PROM;10 reps Wrist Flexion: Supine;PROM;10 reps Wrist Extension: Supine;PROM;10 reps   Wrist Exercises Forearm Supination: Supine;PROM;10 reps Forearm Pronation: Supine;PROM;10 reps Wrist Flexion: Supine;PROM;10 reps Wrist Extension: Supine;PROM;10 reps  Hand Exercises MCPJ Flexion: AROM;10 reps MCPJ Extension: AROM;10 reps PIPJ Flexion: AROM;10 reps PIPJ Extension: AROM;10 reps DIPJ Flexion: AROM;10 reps DIPJ Extension: AROM;10 reps        Occupational Therapy Assessment and Plan OT Assessment and Plan Clinical Impression Statement: A: Increased edema noted in left arm. patient has not been wearing edema glove at home. Educated patient regarding importance of wearing edema glove to decrease edema. Patient also experiencing increase tone in left shoulder. Educated patient to have wife help stretch him out every day to decrease tone, pain level and increase joint mobility.  OT Plan: P: Attempt  to don socks with LUE and sock aid. RE-ASSESS this date.   Goals Short Term Goals Time to Complete Short Term Goals: 6 weeks Short Term Goal 1: Patient will be educated on HEP. Short Term Goal 1 Progress: Progressing toward goal Short Term Goal 2: Patient will increase AROM in his left shoulder to 75% for increased ablility to reach for objects when completing functional activiites.  Short Term Goal 2 Progress: Progressing toward goal Short Term Goal 3: Patient will increase left grip strength by 10 pounds and pich strength by 5 pounds for increased ability to open containers when working on his cars. Short Term Goal 3 Progress: Progressing toward goal Short Term Goal 4: Patient will complete Nine Hole Peg Test in standardized fashion. Short Term Goal 4 Progress: Progressing toward goal Short Term Goal 5: Patient will attend to his left side 75% of the time when completing functional activities. Short Term Goal 5 Progress: Progressing toward goal Additional Short Term Goals?: Yes Short Term Goal 6: Patient will dress himself and bathe himself with min pa. Short Term Goal 6 Progress: Progressing toward goal Short Term Goal 7: Patient will decrease edema in left hand to minimal as he increases his functional use of his left hand.  Short Term Goal 7 Progress: Progressing toward goal Short Term Goal 8: Patient will use his left hand as a gross assist Ily with all daily activities. Short Term Goal 8 Progress: Progressing toward goal Short Term Goal 9: Patient will be able to reach forward and grasp his toothbrush with his left hand with minimal facilitation at his left shoulder.  Short Term Goal 9 Progress: Progressing toward goal Long Term Goals Time to Complete Long Term Goals: 12 weeks Long Term Goal 1: Patient will return to [facility] host activities and be able to feed himself a biscuit using his  left hand. Long Term Goal 2: Patient will increase AROM in his left shoulder to 90% for  increased ablility to reach for objects when completing functional activiites.  Long Term Goal 3: Patient will increase left grip strength by 40 pounds and pich strength by 10  pounds for increased ability to open containers when working on his cars. Long Term Goal 4: Patient will improve fine motor coordination in order to complete Nine Hole Peg Test in 42' or less. Long Term Goal 5: Patient will attend to his left side 95% of the time when completing functional activities. Additional Long Term Goals?: Yes Long Term Goal 6: Patient will dress himself and bathe himself independently. Long Term Goal 7: Patient will improve to Brunnstrom stage V-VI in his left upper extremity in order to use his left arm as an active assist with all daily activiites.   Problem List Patient Active Problem List  Diagnosis  . Acute pancreatitis  . Cholelithiasis  . Hyperglycemia  . Current every day smoker  . Atrophy of right kidney  . Kidney calculi  . Hemiplegia, unspecified, affecting nondominant side  . Stented coronary artery  . Stroke, acute, embolic, right  . Seizure  . Cytotoxic cerebral edema  . Aortic thromboembolism  . Muscle weakness (generalized)  . Difficulty in walking  . Lack of coordination  . Left-sided neglect    End of Session Activity Tolerance: Patient tolerated treatment well General Behavior During Session: [facility] for tasks performed Cognition: [facility] for tasks performed  GO Functional Assessment Tool Used: FAQ scored 40/70 = 57% I level with daily actvities. Functional Limitation: Self care Self Care Current Status [phone]): At least 40 percent but less than 60 percent impaired, limited or restricted Self Care Goal Status ([id number]): At least 1 percent but less than 20 percent impaired, limited or restricted  Limmie Patricia, OTR/L  06/22/2012, 2:09 PM

## 2012-06-24 ENCOUNTER — Ambulatory Visit (HOSPITAL_COMMUNITY)
Admission: RE | Admit: 2012-06-24 | Discharge: 2012-06-24 | Disposition: A | Discharge status: Home / Self Care | Payer: Medicare Other | Source: Ambulatory Visit | Attending: Physical Medicine & Rehabilitation | Admitting: Physical Medicine & Rehabilitation

## 2012-06-24 NOTE — Progress Notes (Signed)
Occupational Therapy Treatment Patient Details  Name: Shaun Pugh MRN: [id number] Date of Birth: 1950-06-10  Today's Date: 06/24/2012 Time: 1308-6578 OT Time Calculation (min): 50 min Re-assess 4696-2952 30' NM re-ed 8413-2440 10' MFR 1055-1105 10'  Visit#: 11 of 36  Re-eval: 07/22/12 (update g code on 06/20/12)    Authorization: Medicare Blue   Authorization Time Period: before 20th visit   Authorization Visit#: 10 of 36  Subjective Symptoms/Limitations Symptoms: S: Shaun Pugh has been stretching me out at home like you did for me the other day. It felt like she was pulling my arm out. Pain Assessment Currently in Pain?: Yes Pain Score:   2 (6/10 with any shoulder movement.) Pain Location: Arm Pain Orientation: Left Pain Type: Acute pain  Precautions/Restrictions  Precautions Precautions: Fall  Exercise/Treatments Seated Other Seated Exercises: Pt performed AROM shoulder flexion on inclined wedge while seated at table; x10, vc's for shoulder depression. Elbow Exercises Elbow Flexion: Seated;10 reps;AROM Elbow Extension: Seated;AROM;10 reps  Hand Exercises Digit Composite Abduction: AROM;10 reps Digit Composite Adduction: AROM;10 reps Thumb Opposition: thumb to pointer, middle and ring finger Sponges: gross grasp and release; 36 reps; facilitation forearm to assist with movement.     Manual Therapy Manual Therapy: Myofascial release Myofascial Release: MFR to MCPJ of left thumb and associated areas to decrease pain with active movement of thumb. Good release felt several times during MFR.  Occupational Therapy Assessment and Plan OT Assessment and Plan Clinical Impression Statement: A: Increase tightness in left thumb MCP joint. Expresses pain with joint movement. Patient was wearing edema glove today. Educated patient regarding using left arm as much as possible when perfoming tasks. patient states that he has not been because he knows he can't. Encouraged patient to  use left hand first for every activity. See MD noted for progress. OT Plan: P: Attempt to don socks with LUE and sock aid.    Goals Short Term Goals Time to Complete Short Term Goals: 6 weeks Short Term Goal 1: Patient will be educated on HEP. Short Term Goal 1 Progress: Progressing toward goal Short Term Goal 2: Patient will increase AROM in his left shoulder to 75% for increased ablility to reach for objects when completing functional activiites.  Short Term Goal 2 Progress: Progressing toward goal Short Term Goal 3: Patient will increase left grip strength by 10 pounds and pich strength by 5 pounds for increased ability to open containers when working on his cars. Short Term Goal 3 Progress: Progressing toward goal Short Term Goal 4: Patient will complete Nine Hole Peg Test in standardized fashion. Short Term Goal 4 Progress: Progressing toward goal Short Term Goal 5: Patient will attend to his left side 75% of the time when completing functional activities. Short Term Goal 5 Progress: Progressing toward goal Additional Short Term Goals?: Yes Short Term Goal 6: Patient will dress himself and bathe himself with min pa. Short Term Goal 6 Progress: Progressing toward goal Short Term Goal 7: Patient will decrease edema in left hand to minimal as he increases his functional use of his left hand.  Short Term Goal 8: Patient will use his left hand as a gross assist Ily with all daily activities. Short Term Goal 8 Progress: Progressing toward goal Short Term Goal 9: Patient will be able to reach forward and grasp his toothbrush with his left hand with minimal facilitation at his left shoulder.  Short Term Goal 9 Progress: Progressing toward goal Long Term Goals Time to Complete Long Term Goals: 42  weeks Long Term Goal 1: Patient will return to [facility] host activities and be able to feed himself a biscuit using his left hand. Long Term Goal 1 Progress: Progressing toward goal Long Term Goal  2: Patient will increase AROM in his left shoulder to 90% for increased ablility to reach for objects when completing functional activiites.  Long Term Goal 2 Progress: Progressing toward goal Long Term Goal 3: Patient will increase left grip strength by 40 pounds and pich strength by 10  pounds for increased ability to open containers when working on his cars. Long Term Goal 3 Progress: Progressing toward goal Long Term Goal 4: Patient will improve fine motor coordination in order to complete Nine Hole Peg Test in 45' or less. Long Term Goal 4 Progress: Progressing toward goal Long Term Goal 5: Patient will attend to his left side 95% of the time when completing functional activities. Long Term Goal 5 Progress: Progressing toward goal Additional Long Term Goals?: Yes Long Term Goal 6: Patient will dress himself and bathe himself independently. Long Term Goal 6 Progress: Progressing toward goal Long Term Goal 7: Patient will improve to Brunnstrom stage V-VI in his left upper extremity in order to use his left arm as an active assist with all daily activiites.  Long Term Goal 7 Progress: Progressing toward goal  Problem List Patient Active Problem List  Diagnosis  . Acute pancreatitis  . Cholelithiasis  . Hyperglycemia  . Current every day smoker  . Atrophy of right kidney  . Kidney calculi  . Hemiplegia, unspecified, affecting nondominant side  . Stented coronary artery  . Stroke, acute, embolic, right  . Seizure  . Cytotoxic cerebral edema  . Aortic thromboembolism  . Muscle weakness (generalized)  . Difficulty in walking  . Lack of coordination  . Left-sided neglect    End of Session Activity Tolerance: Patient tolerated treatment well General Behavior During Session: [facility] for tasks performed Cognition: [facility] for tasks performed   Limmie Patricia, OTR/L  06/24/2012, 12:14 PM

## 2012-06-27 ENCOUNTER — Ambulatory Visit (HOSPITAL_COMMUNITY)
Admission: RE | Admit: 2012-06-27 | Discharge: 2012-06-27 | Disposition: A | Discharge status: Home / Self Care | Payer: Medicare Other | Source: Ambulatory Visit | Attending: Physical Medicine & Rehabilitation | Admitting: Physical Medicine & Rehabilitation

## 2012-06-27 DIAGNOSIS — R414 Neurologic neglect syndrome: Secondary | ICD-10-CM

## 2012-06-27 DIAGNOSIS — R279 Unspecified lack of coordination: Secondary | ICD-10-CM

## 2012-06-27 NOTE — Progress Notes (Signed)
Physical Therapy Treatment Patient Details  Name: Shaun Pugh MRN: [id number] Date of Birth: 1950/05/17  Today's Date: 06/27/2012 Time: 0922-1012 PT Time Calculation (min): 53 min Visit#: 11 of 12  Re-eval: 06/30/12 Authorization Visit#: 11 of 20  Charges:  therex 35', gait training 10'  Subjective: Symptoms/Limitations Symptoms: Pt. states he's not having any pain today. Pain Assessment Currently in Pain?: Yes Pain Score:   2 Pain Location: Arm Pain Orientation: Left Pain Radiating Towards: shoulder, wrist and hand   Exercise/Treatments Aerobic Tread Mill: BWS TM Gait trainer 0.42 cyc/sec x8 min w/max cueing for L LE step and posture Machines for Strengthening Cybex Knee Extension: L LE only 1.5PL 2x 15 Cybex Knee Flexion: LLE only 3.5 PL 2x 15 Cybex Leg Press: 3.5 Pl 2x 15 Standing Other Standing Knee Exercises: Sled Push/pull 3 RT; L elbow/shoulder stab required      Physical Therapy Assessment and Plan PT Assessment and Plan Clinical Impression Statement: Pt. required manual stabilization of L elbow/shoulder to facilitate power with sled pushing activity.  Able to increase speed on gait trainer with overall improved cadence and stride length.  Attempted L UE only HHA, however too unstable.  Improved posture with gait. PT Plan: Continue to improve LE strength and balance, incorporate L UE WB as able and cognitive challenges.  Re-evaluate next visit.     Problem List Patient Active Problem List  Diagnosis  . Acute pancreatitis  . Cholelithiasis  . Hyperglycemia  . Current every day smoker  . Atrophy of right kidney  . Kidney calculi  . Hemiplegia, unspecified, affecting nondominant side  . Stented coronary artery  . Stroke, acute, embolic, right  . Seizure  . Cytotoxic cerebral edema  . Aortic thromboembolism  . Muscle weakness (generalized)  . Difficulty in walking  . Lack of coordination  . Left-sided neglect    PT - End of Session Equipment  Utilized During Treatment: Gait belt Activity Tolerance: Patient limited by fatigue General Behavior During Session: [facility] for tasks performed Cognition: [facility] for tasks performed   Lurena Nida, PTA/CLT 06/27/2012, 10:12 AM

## 2012-06-27 NOTE — Progress Notes (Signed)
Occupational Therapy Treatment Patient Details  Name: Shaun Pugh MRN: [id number] Date of Birth: 18-Apr-1951  Today's Date: 06/27/2012 Time: 1015-1100 OT Time Calculation (min): 45 min Therex 4098-1191 10' MFR 4782-9562 10' NM re-ed 1035-1100 25'  Visit#: 12 of 36  Re-eval: 07/22/12 (update g code on 06/20/12)    Authorization: Medicare Blue   Authorization Time Period: before 20th visit   Authorization Visit#: 11 of 36  Subjective Symptoms/Limitations Symptoms: S: [name] popped my shoulder this morning. My arm was caught in the sheet and she pulled it out. Pain Assessment Currently in Pain?: Yes Pain Score:   5 Pain Location: Shoulder Pain Orientation: Left Pain Type: Acute pain Pain Radiating Towards: shoulder, wrist and hand  Precautions/Restrictions  Precautions Precautions: Fall  Exercise/Treatments Supine External Rotation: PROM;5 reps (limited range and increase pain) Internal Rotation: PROM;5 reps Flexion: PROM;AAROM;10 reps;Limitations ABduction: PROM;10 reps;Limitations Elbow Exercises Elbow Flexion: Supine;AROM;5 reps Elbow Extension: Supine;AROM;5 reps Forearm Supination: Supine;PROM;5 reps Forearm Pronation: Supine;PROM;5 reps Wrist Exercises Wrist Flexion: Supine;PROM;5 reps Wrist Extension: Supine;PROM;5 reps   Hand Exercises Theraputty - Flatten: yellow Theraputty - Roll: yellow Theraputty - Grip: yellow Theraputty - Pinch: yellow     Manual Therapy Manual Therapy: Myofascial release Myofascial Release: MFR and manual stretching to left shoulder and bicep region to decrease pain and increase active movement. Weight Bearing Technique Weight Bearing Technique: Yes LUE Weight Bearing Technique: Extended arm standing Response to Weight Bearing Technique: Tolerated well. Performed yellow putty activitiy: flatten  Occupational Therapy Assessment and Plan OT Assessment and Plan Clinical Impression Statement: A: Less tightness felt during PROM  shoulder flexion. Increase tightness and pain during IR/ER PROM. Edema decreased in left hand but still present. OT Plan: P: Attempt to don socks with LUE and sock aid. E-Stim to left shoulder.   Goals Short Term Goals Time to Complete Short Term Goals: 6 weeks Short Term Goal 1: Patient will be educated on HEP. Short Term Goal 1 Progress: Progressing toward goal Short Term Goal 2: Patient will increase AROM in his left shoulder to 75% for increased ablility to reach for objects when completing functional activiites.  Short Term Goal 2 Progress: Progressing toward goal Short Term Goal 3: Patient will increase left grip strength by 10 pounds and pich strength by 5 pounds for increased ability to open containers when working on his cars. Short Term Goal 3 Progress: Progressing toward goal Short Term Goal 4: Patient will complete Nine Hole Peg Test in standardized fashion. Short Term Goal 4 Progress: Progressing toward goal Short Term Goal 5: Patient will attend to his left side 75% of the time when completing functional activities. Short Term Goal 5 Progress: Progressing toward goal Additional Short Term Goals?: Yes Short Term Goal 6: Patient will dress himself and bathe himself with min pa. Short Term Goal 6 Progress: Progressing toward goal Short Term Goal 7: Patient will decrease edema in left hand to minimal as he increases his functional use of his left hand.  Short Term Goal 7 Progress: Progressing toward goal Short Term Goal 8: Patient will use his left hand as a gross assist Ily with all daily activities. Short Term Goal 8 Progress: Progressing toward goal Short Term Goal 9: Patient will be able to reach forward and grasp his toothbrush with his left hand with minimal facilitation at his left shoulder.  Short Term Goal 9 Progress: Progressing toward goal Long Term Goals Time to Complete Long Term Goals: 12 weeks Long Term Goal 1: Patient will return to  [place] host activities and  be able to feed himself a biscuit using his left hand. Long Term Goal 1 Progress: Progressing toward goal Long Term Goal 2: Patient will increase AROM in his left shoulder to 90% for increased ablility to reach for objects when completing functional activiites.  Long Term Goal 2 Progress: Progressing toward goal Long Term Goal 3: Patient will increase left grip strength by 40 pounds and pich strength by 10  pounds for increased ability to open containers when working on his cars. Long Term Goal 3 Progress: Progressing toward goal Long Term Goal 4: Patient will improve fine motor coordination in order to complete Nine Hole Peg Test in 45' or less. Long Term Goal 4 Progress: Progressing toward goal Long Term Goal 5: Patient will attend to his left side 95% of the time when completing functional activities. Long Term Goal 5 Progress: Progressing toward goal Additional Long Term Goals?: Yes Long Term Goal 6: Patient will dress himself and bathe himself independently. Long Term Goal 6 Progress: Progressing toward goal Long Term Goal 7: Patient will improve to Brunnstrom stage V-VI in his left upper extremity in order to use his left arm as an active assist with all daily activiites.  Long Term Goal 7 Progress: Progressing toward goal  Problem List Patient Active Problem List  Diagnosis  . Acute pancreatitis  . Cholelithiasis  . Hyperglycemia  . Current every day smoker  . Atrophy of right kidney  . Kidney calculi  . Hemiplegia, unspecified, affecting nondominant side  . Stented coronary artery  . Stroke, acute, embolic, right  . Seizure  . Cytotoxic cerebral edema  . Aortic thromboembolism  . Muscle weakness (generalized)  . Difficulty in walking  . Lack of coordination  . Left-sided neglect    End of Session Activity Tolerance: Patient tolerated treatment well General Behavior During Session: [facility] for tasks performed Cognition: [facility] for tasks performed   Limmie Patricia,  OTR/L  06/27/2012, 11:14 AM

## 2012-06-30 ENCOUNTER — Ambulatory Visit (HOSPITAL_COMMUNITY)
Admission: RE | Admit: 2012-06-30 | Discharge: 2012-06-30 | Disposition: A | Discharge status: Home / Self Care | Payer: Medicare Other | Source: Ambulatory Visit | Attending: Physical Medicine & Rehabilitation | Admitting: Physical Medicine & Rehabilitation

## 2012-06-30 DIAGNOSIS — R279 Unspecified lack of coordination: Secondary | ICD-10-CM

## 2012-06-30 DIAGNOSIS — R414 Neurologic neglect syndrome: Secondary | ICD-10-CM

## 2012-06-30 NOTE — Evaluation (Signed)
Physical Therapy Discharge  Patient Details  Name: Shaun Pugh MRN: [id number] Date of Birth: 1950-11-12  Today's Date: 06/30/2012 Time: 1100-1155 PT Time Calculation (min): 55 min Charges: 25' PPT, 1 MMT, 15' Gait Visit#: 12 of 12  Re-eval:   Assessment Diagnosis: L sided weakness s/p stroke Surgical Date: 03/23/12 Next MD Visit: Dr. Wynn Banker - 06/06/12  Authorization: BC[employer] Medicare  Authorization Time Period:    Authorization Visit#: 12 of 20   Subjective Symptoms/Limitations Symptoms: He reports decreased adherence with his LE exercises.  He is able to walk in [place] with a grocery cart.  Pain Assessment Currently in Pain?: Yes Pain Score:   3 Pain Location: Shoulder Pain Orientation: Left Pain Type: Acute pain  Precautions/Restrictions  Precautions Precautions: Fall  Sensation/Coordination/Flexibility/Functional Tests Functional Tests Functional Tests: 5 STS: 17 sec  Assessment LLE Strength Left Hip Flexion: 4/5 (was 3/) Left Hip Extension: 3+/5 (was 3-/5) Left Hip ABduction: 3/5 (was 3-/5) Left Hip ADduction: 3/5 (was 3-/5) Left Knee Flexion: 5/5 (was 5/5) Left Knee Extension: 5/5 (was 3+/5) Left Ankle Dorsiflexion: 4/5 (was 4/5)  Exercise/Treatments Mobility/Balance  Ambulation/Gait Assistive device: Straight cane Gait velocity: 280' in 2 minutes Berg Balance Test Sit to Stand: Able to stand without using hands and stabilize independently Standing Unsupported: Able to stand safely 2 minutes Sitting with Back Unsupported but Feet Supported on Floor or Stool: Able to sit safely and securely 2 minutes Stand to Sit: Sits safely with minimal use of hands Transfers: Able to transfer safely, minor use of hands Standing Unsupported with Eyes Closed: Able to stand 10 seconds safely Standing Ubsupported with Feet Together: Able to place feet together independently and stand 1 minute safely From Standing, Reach Forward with Outstretched Arm: Can reach forward  >12 cm safely (5") From Standing Position, Pick up Object from Floor: Able to pick up shoe safely and easily From Standing Position, Turn to Look Behind Over each Shoulder: Looks behind one side only/other side shows less weight shift Turn 360 Degrees: Able to turn 360 degrees safely one side only in 4 seconds or less Standing Unsupported, Alternately Place Feet on Step/Stool: Able to stand independently and complete 8 steps >20 seconds Standing Unsupported, One Foot in Front: Able to plae foot ahead of the other independently and hold 30 seconds Standing on One Leg: Able to lift leg independently and hold equal to or more than 3 seconds Total Score: 49 (was 43)  Dynamic Gait Index Level Surface: Mild Impairment Change in Gait Speed: Mild Impairment Gait with Horizontal Head Turns: Mild Impairment Gait with Vertical Head Turns: Mild Impairment Gait and Pivot Turn: Mild Impairment Step Over Obstacle: Mild Impairment Step Around Obstacles: Mild Impairment Steps: Mild Impairment Total Score: 16 (was 12)  Aerobic Tread Mill: B UE A 8 minutes 0.8-1.1 mph w/max cueing for posture and decrease scissoring gait. Standing Stairs: 1 RT w/1 handrail and supervision reciprocal pattern Gait training w/SPC x8 minutes Seated Long Arc Quad: Left;15 reps;Weights Long Arc Quad Weight: 4 lbs. Stool Scoot - Round Trips: 1 on tile Other Seated Knee Exercises: hip flexion w/manual resistance 15 reps  Manual Therapy Manual Therapy: Edema management Edema Management: Massage and AROM this date in left hand.  Physical Therapy Assessment and Plan PT Assessment and Plan Clinical Impression Statement: Shaun Pugh has attend 12 OP PT visits s/p CVA w/L sided weakness with the following findings: he has mildly improved his LE strength, significant improvment in overall activity tolerance and balance.  He denies adherence w/HEP.  He is walking with his wife in the community up to 45 minutes.  Suggested pt move  to a [place] for improved gait spped.  At this time pt will be D/C from PT and will continue to focus on improving activity tolerance and endurance on his own.  PT Plan: D/C    Goals Pt will Perform Home Exercise Program: Independently: Not progressing PT Short Term Goals: 2 weeks Goal 1: Pt will complete the Berg Balance Test and DGI. : Met  Goal 2: Pt will improve L LE strength by 1 muscle grade. : Met Goal 3: Pt will improve his L LE static balance and demonstrate L tandem stance x15 sec.  (30 seconds): Met Goal 4: Pt will improve his activity tolerance and ambulate with SPC in outdoor and indoor environment x10 minutes.: Met Goal 5: Pt will perform 5 STS in 8.4 sec. for improved functional strength for age.  (17 seconds): Progressing toward goal Goal 6: Pt will improve his activity tolerance and mobility and ambulate 625 feet in 2 minutes for appropriate gait speed for age.: Progressing toward goal PT Long Term Goals: 8 weeks Goal 1: Pt will improve his LE strength to [facility]  in order to ascend and descend 5 stairs w/1 handrail w/reciprocal pattern in order to safely enter community dwellings. : Met Goal 2: Pt will improve his activity tolerance and demonstrate standing and walking x30 minutes in order to continue with outdoor activities to improve his QOL.: Met Goal 3: Pt will improve his ABC to 75% in order to improve his percieved functional ability.  (54%): Progressing toward goal Goal 4: Pt will improve his dynamic balance and demonstrate outdoor ambulation x20 minutes with a SPC in order to continue with household activities at home to improve his QOL. : Progressing toward goal Goal 5: Pt will improve his TUG to 13 sec for improved mobility in the community; Berg to 50/56 to decrease fall risk; DGI to 20/24 for improved safety ambulating in community in order to safely attend church activities. : Progressing toward goal  Problem List Patient Active Problem List  Diagnosis  . Acute  pancreatitis  . Cholelithiasis  . Hyperglycemia  . Current every day smoker  . Atrophy of right kidney  . Kidney calculi  . Hemiplegia, unspecified, affecting nondominant side  . Stented coronary artery  . Stroke, acute, embolic, right  . Seizure  . Cytotoxic cerebral edema  . Aortic thromboembolism  . Muscle weakness (generalized)  . Difficulty in walking  . Lack of coordination  . Left-sided neglect    PT - End of Session Equipment Utilized During Treatment: Gait belt Activity Tolerance: Patient limited by fatigue General Behavior During Session: Shaun Pugh for tasks performed Cognition: [facility] for tasks performed PT Plan of Care Consulted and Agree with Plan of Care: Patient;Family member/caregiver Family Member Consulted: Wife (cindy)  GP Functional Limitation: Mobility: Walking and moving around Mobility: Walking and Moving Around Goal Status [phone]): At least 20 percent but less than 40 percent impaired, limited or restricted Mobility: Walking and Moving Around Discharge Status [phone]): At least 40 percent but less than 60 percent impaired, limited or restricted  Kentrel Clevenger, PT 06/30/2012, 12:07 PM  Physician Documentation Your signature is required to indicate approval of the treatment plan as stated above.  Please sign and either send electronically or make a copy of this report for your files and return this physician signed original.   Please mark one 1.__approve of plan  2. ___approve of plan  with the following conditions.   ______________________________                                                          _____________________ Physician Signature                                                                                                             Date

## 2012-06-30 NOTE — Progress Notes (Signed)
Occupational Therapy Treatment Patient Details  Name: Shaun Pugh MRN: [id number] Date of Birth: [birth year]  Today's Date: 06/30/2012 Time: 9528-4132 OT Time Calculation (min): 50 min Self care 1015-1030 15' Massage 1030-1045 15' Therex 4401-0272 20'   Visit#: 13 of 36  Re-eval: 07/22/12     Authorization: Medicare Blue   Authorization Time Period: before 20th visit   Authorization Visit#: 12 of 36  Subjective Symptoms/Limitations Symptoms: S: I only have a little pain in my shoulder. Pain Assessment Currently in Pain?: Yes Pain Score:   3 Pain Location: Shoulder Pain Orientation: Left Pain Type: Acute pain  Precautions/Restrictions  Precautions Precautions: Fall  Exercise/Treatments Hand Exercises MCPJ Flexion: PROM;5 reps MCPJ Extension: PROM;5 reps PIPJ Flexion: PROM;5 reps PIPJ Extension: PROM;5 reps DIPJ Flexion: PROM;5 reps DIPJ Extension: PROM;5 reps Thumb Opposition: thumb to pointer and middle Large Pegboard: 11 pegs placed in board with max faciliate at shoulder to depress and forearm to guide movement.     Manual Therapy Manual Therapy: Edema management Edema Management: Massage and AROM this date in left hand.  Occupational Therapy Assessment and Plan OT Assessment and Plan Clinical Impression Statement: A: Attempted E-Stim again this date. Unable to perform; machine was not working. Gave patient a medium edema glove as swelling as decreased but is still significant in thenar region and dorsal side of hand. OT Plan: P: Attempt to don socks with LUE and sock aid. E-Stim to left shoulder.   Goals Home Exercise Program Pt will Perform Home Exercise Program: Independently PT Goal: Perform Home Exercise Program - Progress: Not progressing Short Term Goals Time to Complete Short Term Goals: 6 weeks Short Term Goal 1: Patient will be educated on HEP. Short Term Goal 2: Patient will increase AROM in his left shoulder to 75% for increased ablility to  reach for objects when completing functional activiites.  Short Term Goal 3: Patient will increase left grip strength by 10 pounds and pich strength by 5 pounds for increased ability to open containers when working on his cars. Short Term Goal 4: Patient will complete Nine Hole Peg Test in standardized fashion. Short Term Goal 5: Patient will attend to his left side 75% of the time when completing functional activities. Additional Short Term Goals?: Yes Short Term Goal 6: Patient will dress himself and bathe himself with min pa. Short Term Goal 7: Patient will decrease edema in left hand to minimal as he increases his functional use of his left hand.  Short Term Goal 8: Patient will use his left hand as a gross assist Ily with all daily activities. Short Term Goal 9: Patient will be able to reach forward and grasp his toothbrush with his left hand with minimal facilitation at his left shoulder.  Long Term Goals Time to Complete Long Term Goals: 12 weeks Long Term Goal 1: Patient will return to [facility] host activities and be able to feed himself a biscuit using his left hand. Long Term Goal 2: Patient will increase AROM in his left shoulder to 90% for increased ablility to reach for objects when completing functional activiites.  Long Term Goal 3: Patient will increase left grip strength by 40 pounds and pich strength by 10  pounds for increased ability to open containers when working on his cars. Long Term Goal 4: Patient will improve fine motor coordination in order to complete Nine Hole Peg Test in 29' or less. Long Term Goal 5: Patient will attend to his left side 95% of the time when  completing functional activities. Additional Long Term Goals?: Yes Long Term Goal 6: Patient will dress himself and bathe himself independently. Long Term Goal 7: Patient will improve to Brunnstrom stage V-VI in his left upper extremity in order to use his left arm as an active assist with all daily activiites.    Problem List Patient Active Problem List  Diagnosis  . Acute pancreatitis  . Cholelithiasis  . Hyperglycemia  . Current every day smoker  . Atrophy of right kidney  . Kidney calculi  . Hemiplegia, unspecified, affecting nondominant side  . Stented coronary artery  . Stroke, acute, embolic, right  . Seizure  . Cytotoxic cerebral edema  . Aortic thromboembolism  . Muscle weakness (generalized)  . Difficulty in walking  . Lack of coordination  . Left-sided neglect    End of Session Activity Tolerance: Patient tolerated treatment well General Behavior During Session: [facility] for tasks performed Cognition: [facility] for tasks performed   Limmie Patricia, OTR/L  06/30/2012, 12:08 PM

## 2012-07-01 ENCOUNTER — Ambulatory Visit (HOSPITAL_COMMUNITY): Payer: Medicare Other | Admitting: Specialist

## 2012-07-01 ENCOUNTER — Ambulatory Visit (HOSPITAL_COMMUNITY)
Admission: RE | Admit: 2012-07-01 | Discharge: 2012-07-01 | Disposition: A | Discharge status: Home / Self Care | Payer: Medicare Other | Source: Ambulatory Visit | Attending: Physical Medicine & Rehabilitation | Admitting: Physical Medicine & Rehabilitation

## 2012-07-01 DIAGNOSIS — R414 Neurologic neglect syndrome: Secondary | ICD-10-CM

## 2012-07-01 DIAGNOSIS — R279 Unspecified lack of coordination: Secondary | ICD-10-CM

## 2012-07-01 DIAGNOSIS — I639 Cerebral infarction, unspecified: Secondary | ICD-10-CM

## 2012-07-01 NOTE — Progress Notes (Signed)
Occupational Therapy Treatment Patient Details  Name: Shaun Pugh MRN: [id number] Date of Birth: July 10, 1950  Today's Date: 07/01/2012 Time: 4696-2952 OT Time Calculation (min): 44 min Neurological reeducation 79' Visit#: 14 of 36  Re-eval: 07/22/12    Authorization: Medicare Blue   Authorization Time Period: before 20th visit   Authorization Visit#: 14 of 20  Subjective Symptoms/Limitations Symptoms: S:  I couldnt open the hood of the VW today with my left hand.  It really frustrated me. Pain Assessment Currently in Pain?: Yes Pain Score:   3 Pain Location: Shoulder Pain Orientation: Left Pain Type: Acute pain  Precautions/Restrictions   progress as tolerated  Exercise/Treatments Neurological Re-education Exercises Shoulder Flexion: Seated;AAROM;10 reps Shoulder ABduction: Seated;AROM;10 reps Shoulder External Rotation: Seated;AAROM;10 reps Shoulder Internal Rotation: Seated;AAROM;10 reps Elbow Flexion: Seated;AROM;10 reps Elbow Extension: Seated;AROM;10 reps Forearm Supination: Seated;AAROM;10 reps Forearm Pronation: Seated;AAROM;10 reps Wrist Flexion: Seated;10 reps;AROM Wrist Extension: Seated;AROM;10 reps  Development of Reach and Grasp and Release Saebo Ring Tree: sat in chair with crate of balls positioned at waist height patient retrieved ball, and then reached to his left into abduction to reach through adjustable height target to place ball into second crate.  Patient able to grasp balls with min difficulty.  He required minimal facilitation to unweight his left arm in order to move his arm forward and through the target.  Patient was able to complete approximately 30% of the reps without facilitation.    Fine Motor Coordination Fine Motor Coordination: Maneuvering Blocks Maneuvering Blocks: able to stack up to 2 blocks on top of eachother independently and up to 4 with minimal facilitation to unweight his arm from OTR/L        Occupational Therapy  Assessment and Plan OT Assessment and Plan Clinical Impression Statement: A:  Patient able to complete reaching to chest level into flexion and abduction with min facilitation to unweight arm .  Able to grasp and release saebo balls and small blocks independently.  OT Plan: P: Attempt to don socks with LUE and sock aid. Increase independence with reaching without OT unweighting arm.     Goals Short Term Goals Time to Complete Short Term Goals: 6 weeks Short Term Goal 1: Patient will be educated on HEP. Short Term Goal 2: Patient will increase AROM in his left shoulder to 75% for increased ablility to reach for objects when completing functional activiites.  Short Term Goal 3: Patient will increase left grip strength by 10 pounds and pich strength by 5 pounds for increased ability to open containers when working on his cars. Short Term Goal 4: Patient will complete Nine Hole Peg Test in standardized fashion. Short Term Goal 5: Patient will attend to his left side 75% of the time when completing functional activities. Additional Short Term Goals?: Yes Short Term Goal 6: Patient will dress himself and bathe himself with min pa. Short Term Goal 7: Patient will decrease edema in left hand to minimal as he increases his functional use of his left hand.  Short Term Goal 8: Patient will use his left hand as a gross assist Ily with all daily activities. Short Term Goal 9: Patient will be able to reach forward and grasp his toothbrush with his left hand with minimal facilitation at his left shoulder.  Long Term Goals Time to Complete Long Term Goals: 12 weeks Long Term Goal 1: Patient will return to [facility] host activities and be able to feed himself a biscuit using his left hand. Long Term Goal 2:  Patient will increase AROM in his left shoulder to 90% for increased ablility to reach for objects when completing functional activiites.  Long Term Goal 3: Patient will increase left grip strength by 40  pounds and pich strength by 10  pounds for increased ability to open containers when working on his cars. Long Term Goal 4: Patient will improve fine motor coordination in order to complete Nine Hole Peg Test in 56' or less. Long Term Goal 5: Patient will attend to his left side 95% of the time when completing functional activities. Additional Long Term Goals?: Yes Long Term Goal 6: Patient will dress himself and bathe himself independently. Long Term Goal 7: Patient will improve to Brunnstrom stage V-VI in his left upper extremity in order to use his left arm as an active assist with all daily activiites.   Problem List Patient Active Problem List  Diagnosis  . Acute pancreatitis  . Cholelithiasis  . Hyperglycemia  . Current every day smoker  . Atrophy of right kidney  . Kidney calculi  . Hemiplegia, unspecified, affecting nondominant side  . Stented coronary artery  . Stroke, acute, embolic, right  . Seizure  . Cytotoxic cerebral edema  . Aortic thromboembolism  . Muscle weakness (generalized)  . Difficulty in walking  . Lack of coordination  . Left-sided neglect    End of Session Activity Tolerance: Patient tolerated treatment well General Behavior During Session: [facility] for tasks performed Cognition: [facility] for tasks performed OT Plan of Care OT Patient Instructions: Discussed difficulty of opening hood of car with his left arm.   Consulted and Agree with Plan of Care: Patient;Family member/caregiver Family Member Consulted: wife   [name], OTR/L  07/01/2012, 10:56 AM

## 2012-07-04 ENCOUNTER — Ambulatory Visit (HOSPITAL_COMMUNITY): Payer: Medicare Other | Admitting: Physical Therapy

## 2012-07-04 ENCOUNTER — Ambulatory Visit (HOSPITAL_COMMUNITY)
Admission: RE | Admit: 2012-07-04 | Discharge: 2012-07-04 | Disposition: A | Discharge status: Home / Self Care | Payer: Medicare Other | Source: Ambulatory Visit | Attending: Physical Medicine & Rehabilitation | Admitting: Physical Medicine & Rehabilitation

## 2012-07-04 ENCOUNTER — Ambulatory Visit (HOSPITAL_BASED_OUTPATIENT_CLINIC_OR_DEPARTMENT_OTHER): Payer: Medicare Other | Admitting: Physical Medicine & Rehabilitation

## 2012-07-04 ENCOUNTER — Encounter: Payer: Self-pay | Admitting: Physical Medicine & Rehabilitation

## 2012-07-04 ENCOUNTER — Encounter: Payer: Medicare Other | Attending: Physical Medicine & Rehabilitation

## 2012-07-04 VITALS — BP 139/90 | HR 81 | Resp 14 | Ht 73.0 in | Wt 248.6 lb

## 2012-07-04 DIAGNOSIS — G90512 Complex regional pain syndrome I of left upper limb: Secondary | ICD-10-CM

## 2012-07-04 DIAGNOSIS — R414 Neurologic neglect syndrome: Secondary | ICD-10-CM

## 2012-07-04 DIAGNOSIS — R279 Unspecified lack of coordination: Secondary | ICD-10-CM

## 2012-07-04 DIAGNOSIS — I2510 Atherosclerotic heart disease of native coronary artery without angina pectoris: Secondary | ICD-10-CM | POA: Insufficient documentation

## 2012-07-04 DIAGNOSIS — R339 Retention of urine, unspecified: Secondary | ICD-10-CM | POA: Insufficient documentation

## 2012-07-04 DIAGNOSIS — I69919 Unspecified symptoms and signs involving cognitive functions following unspecified cerebrovascular disease: Secondary | ICD-10-CM | POA: Insufficient documentation

## 2012-07-04 DIAGNOSIS — G8190 Hemiplegia, unspecified affecting unspecified side: Secondary | ICD-10-CM

## 2012-07-04 DIAGNOSIS — I69959 Hemiplegia and hemiparesis following unspecified cerebrovascular disease affecting unspecified side: Secondary | ICD-10-CM | POA: Insufficient documentation

## 2012-07-04 DIAGNOSIS — G4730 Sleep apnea, unspecified: Secondary | ICD-10-CM | POA: Insufficient documentation

## 2012-07-04 DIAGNOSIS — G90519 Complex regional pain syndrome I of unspecified upper limb: Secondary | ICD-10-CM

## 2012-07-04 MED ORDER — PREDNISONE (PAK) 10 MG PO TABS: 10.0000 mg | ORAL_TABLET | Freq: Every day | ORAL | Status: DC

## 2012-07-04 NOTE — Progress Notes (Signed)
Occupational Therapy Treatment Patient Details  Name: YAKOV BERGEN MRN: [id number] Date of Birth: 1951/02/24  Today's Date: 07/04/2012 Time: 1914-7829 OT Time Calculation (min): 45 min NM re-ed 5621-3086 15' E-stim 1445-1500 15' NM re-ed 5784-6962 15'  Visit#: 15 of 36  Re-eval: 07/22/12    Authorization: Medicare Blue   Authorization Time Period: before 20th visit   Authorization Visit#: 15 of 20  Subjective Symptoms/Limitations Symptoms: S: I was trying to put oil int he VW and it hit my hand. Pain Assessment Currently in Pain?: No/denies  Precautions/Restrictions  Precautions Precautions: Fall  Exercise/Treatments Elbow Exercises Elbow Flexion: Seated;AROM;10 reps Elbow Extension: Seated;AROM;10 reps Forearm Supination: AROM;10 reps Forearm Pronation: AROM;10 reps Wrist Flexion: AROM;10 reps Wrist Extension: AROM;10 reps   Wrist Exercises Forearm Supination: AROM;10 reps Forearm Pronation: AROM;10 reps Wrist Flexion: AROM;10 reps Wrist Extension: AROM;10 reps   Hand Exercises Theraputty - Flatten: yellow Theraputty - Roll: yellow Theraputty - Grip: yellow Other Hand Exercises: Played dominos with rehab tech. Mod-max diffculty using left hand to flip pieces. Max cues to use left hand.     Pharmacologist Location: posterior deltiod and supraspinatus Electrical Stimulation Action: To reduce pain associated with shoulder subluxation Electrical Stimulation Parameters: Symmetric biphasic. Cycle: continuous. Freq: 35Hz . Channel 1 (supraspinatus): 15.39mV Channel 2: (deltiod) 4.19mV Time: 19'  Electrical Stimulation Goals: Pain  Occupational Therapy Assessment and Plan OT Assessment and Plan Clinical Impression Statement: A: Patient tolerated E-stim for shoulder pain. Patient needed mod cues to use left hand during dominos game. OT Plan: P: Attempt to don socks with LUE and sock aid. Increase independence with reaching without OT  unweighting arm.     Goals Short Term Goals Time to Complete Short Term Goals: 6 weeks Short Term Goal 1: Patient will be educated on HEP. Short Term Goal 1 Progress: Progressing toward goal Short Term Goal 2: Patient will increase AROM in his left shoulder to 75% for increased ablility to reach for objects when completing functional activiites.  Short Term Goal 2 Progress: Progressing toward goal Short Term Goal 3: Patient will increase left grip strength by 10 pounds and pich strength by 5 pounds for increased ability to open containers when working on his cars. Short Term Goal 3 Progress: Progressing toward goal Short Term Goal 4: Patient will complete Nine Hole Peg Test in standardized fashion. Short Term Goal 4 Progress: Progressing toward goal Short Term Goal 5: Patient will attend to his left side 75% of the time when completing functional activities. Short Term Goal 5 Progress: Progressing toward goal Additional Short Term Goals?: Yes Short Term Goal 6: Patient will dress himself and bathe himself with min pa. Short Term Goal 6 Progress: Progressing toward goal Short Term Goal 7: Patient will decrease edema in left hand to minimal as he increases his functional use of his left hand.  Short Term Goal 7 Progress: Progressing toward goal Short Term Goal 8: Patient will use his left hand as a gross assist Ily with all daily activities. Short Term Goal 8 Progress: Progressing toward goal Short Term Goal 9: Patient will be able to reach forward and grasp his toothbrush with his left hand with minimal facilitation at his left shoulder.  Short Term Goal 9 Progress: Progressing toward goal Long Term Goals Time to Complete Long Term Goals: 12 weeks Long Term Goal 1: Patient will return to [facility] host activities and be able to feed himself a biscuit using his left hand. Long Term Goal 1 Progress: Progressing  toward goal Long Term Goal 2: Patient will increase AROM in his left shoulder to  90% for increased ablility to reach for objects when completing functional activiites.  Long Term Goal 2 Progress: Progressing toward goal Long Term Goal 3: Patient will increase left grip strength by 40 pounds and pich strength by 10  pounds for increased ability to open containers when working on his cars. Long Term Goal 3 Progress: Progressing toward goal Long Term Goal 4: Patient will improve fine motor coordination in order to complete Nine Hole Peg Test in 45' or less. Long Term Goal 4 Progress: Progressing toward goal Long Term Goal 5: Patient will attend to his left side 95% of the time when completing functional activities. Long Term Goal 5 Progress: Progressing toward goal Additional Long Term Goals?: Yes Long Term Goal 6: Patient will dress himself and bathe himself independently. Long Term Goal 6 Progress: Progressing toward goal Long Term Goal 7: Patient will improve to Brunnstrom stage V-VI in his left upper extremity in order to use his left arm as an active assist with all daily activiites.  Long Term Goal 7 Progress: Progressing toward goal  Problem List Patient Active Problem List  Diagnosis  . Acute pancreatitis  . Cholelithiasis  . Hyperglycemia  . Current every day smoker  . Atrophy of right kidney  . Kidney calculi  . Hemiplegia, unspecified, affecting nondominant side  . Stented coronary artery  . Stroke, acute, embolic, right  . Seizure  . Cytotoxic cerebral edema  . Aortic thromboembolism  . Muscle weakness (generalized)  . Difficulty in walking  . Lack of coordination  . Left-sided neglect    End of Session Activity Tolerance: Patient tolerated treatment well General Behavior During Session: [facility] for tasks performed Cognition: [facility] for tasks performed   Limmie Patricia, OTR/L  07/04/2012, 4:00 PM

## 2012-07-04 NOTE — Patient Instructions (Signed)
Please take the prednisone as prescribed. It may make you feel funny You may gain weight Take it with food it may upset your stomach Also recommend no ibuprofen or Aleve May take Tylenol for pain

## 2012-07-04 NOTE — Progress Notes (Signed)
Subjective:    Patient ID: Shaun Pugh, male    DOB: 12-31-1950, 62 y.o.   MRN: [id number]  HPI 62 year old male with  history of coronary artery disease and TIA with left visual field  deficits who was admitted March 17, 2012, with headache, left-sided  weakness, numbness, and slurred speech. He was treated with tPA. CTA  head and neck showed hypodensity at right superior gyrus, ulcerated  plaque at right ICA with 60% ICA stenosis and suspicion of 10-mm focal  thrombus proximal aortic arch. The patient developed combativeness with  agitation as well as focal motor seizures of left upper extremity on  March 18, 2012. He was treated with Ativan and was started on Keppra  for treatment. Followup CT showed extensive area of low density  affecting cortical and subcortical right hemisphere in the MCA  distribution. 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 He was placed on aspirin and  no other anticoagulation due to large stroke and risk for bleeding  Was having emotional outbursts which wife thought was due to Keppra, reduced the dose from 120mg  BID and is now 250mg  BID with improvement in mood.  Discharge from physical therapy. Upgraded from quad cane to straight cane. No falls since last visit on 06/06/2012 Neurology appointment in about 2 weeks to discuss Keppra and other stroke prevention issues  Left shoulder pain with range of motion Left wrist pain with motion as well as left thumb IP pain Pain Inventory Average Pain 0 Pain Right Now 0 My pain is no pain  In the last 24 hours, has pain interfered with the following? General activity 0 Relation with others 0 Enjoyment of life 0 What TIME of day is your pain at its worst? no pain Sleep (in general) Good  Pain is worse with: no pain Pain improves with: no pain Relief from Meds: no pain med  Mobility use a cane ability to climb steps?  yes do you drive?   no  Function retired  Neuro/Psych No problems in this area  Prior Studies Any changes since last visit?  no  Physicians involved in your care Any changes since last visit?  no   Family History  Problem Relation Age of Onset  . Anesthesia problems Neg Hx   . Hypotension Neg Hx   . Malignant hyperthermia Neg Hx   . Pseudochol deficiency Neg Hx   . Congestive Heart Failure Mother   . Diabetes Father   . Heart attack Father    History   Social History  . Marital Status: Married    Spouse Name: N/A    Number of Children: N/A  . Years of Education: N/A   Social History Main Topics  . Smoking status: Former Smoker -- 1.00 packs/day for 45 years    Types: Cigarettes    Quit date: 03/18/2012  . Smokeless tobacco: Never Used  . Alcohol Use: No  . Drug Use: No  . Sexually Active: Yes    Birth Control/ Protection: None   Other Topics Concern  . None   Social History Narrative  . None   Past Surgical History  Procedure Laterality Date  . Back surgery      x 4   . Cholecystectomy  09/25/2011    Procedure: LAPAROSCOPIC CHOLECYSTECTOMY;  Surgeon: Fabio Bering, MD;  Location: [facility];  Service: General;  Laterality: N/A;   Past Medical History  Diagnosis Date  . High cholesterol   .  Atrophy of right kidney 09/15/2011  . Kidney calculi 09/15/2011  . Acute pancreatitis 09/14/2011  . Cholelithiasis 09/14/2011  . Chronic back pain   . Sleep apnea     STOP BANG 5  . TIA (transient ischemic attack)   . CAD (coronary artery disease)   . Stented coronary artery approx 2004  . Stroke    BP 139/90  Pulse 81  Resp 14  Ht 6\' 1"  (1.854 m)  Wt 248 lb 9.6 oz (112.764 kg)  BMI 32.81 kg/m2  SpO2 98%    Review of Systems  All other systems reviewed and are negative.       Objective:   Physical Exam  Nursing note and vitals reviewed. Constitutional: He is oriented to person, place, and time. He appears well-developed.  Obese  HENT:  Head: Normocephalic and  atraumatic.  Eyes: Conjunctivae and EOM are normal. Pupils are equal, round, and reactive to light.  Neck: Normal range of motion.  Musculoskeletal:       Left shoulder: He exhibits decreased range of motion, pain and spasm.  Increased pectoralis tone  Neurological: He is alert and oriented to person, place, and time. He has normal reflexes. He displays no atrophy. No cranial nerve deficit or sensory deficit. He exhibits abnormal muscle tone. Coordination and gait abnormal.  Mild left neglect Decreased fine motor left upper extremity Pain with range of motion left wrist and left shoulder and left thumb  Swelling of dorsum left hand No hypersensitivity to touch  Skin: Skin is warm and dry.  Psychiatric: He has a normal mood and affect.          Assessment & Plan:  1. 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 Recommend no driving. Focal motor seizures documented. Have neuro assess for Keppra dose and duration. He is almost 4 months post stroke 2. Possible left shoulder hand syndrome. He does have swelling he does have shoulder pain but has wrist pain rather than hand pain. Will trial prednisone 12 day course Return to clinic in one month 3. Spasticity no need for Botox at the current time. Continue outpatient OT

## 2012-07-06 ENCOUNTER — Ambulatory Visit (HOSPITAL_COMMUNITY)
Admission: RE | Admit: 2012-07-06 | Discharge: 2012-07-06 | Disposition: A | Discharge status: Home / Self Care | Payer: Medicare Other | Source: Ambulatory Visit | Attending: Internal Medicine | Admitting: Internal Medicine

## 2012-07-06 ENCOUNTER — Ambulatory Visit (HOSPITAL_COMMUNITY): Payer: Medicare Other

## 2012-07-06 DIAGNOSIS — R414 Neurologic neglect syndrome: Secondary | ICD-10-CM

## 2012-07-06 DIAGNOSIS — R279 Unspecified lack of coordination: Secondary | ICD-10-CM

## 2012-07-06 NOTE — Progress Notes (Signed)
Occupational Therapy Treatment Patient Details  Name: KHAREE LESESNE MRN: [id number] Date of Birth: Sep 13, 1950  Today's Date: 07/06/2012 Time: 4098-1191 OT Time Calculation (min): 60 min ADL 1015-1045 15' NM Re-ed 1045-1100 30' ADL 1100-1115 15'  Visit#: 16 of 36  Re-eval: 07/22/12    Authorization: Medicare Blue   Authorization Time Period: before 20th visit   Authorization Visit#: 16 of 20  Subjective Symptoms/Limitations Symptoms: S: That E-Stim helped my shoulder pain last time. Pain Assessment Currently in Pain?: No/denies  Precautions/Restrictions   Fall  Exercise/Treatments Wrist Exercises Forearm Supination: AAROM;5 reps Forearm Pronation: AAROM;5 reps Wrist Flexion: AROM;5 reps Wrist Extension: AROM;5 reps     Hand Exercises Digit Composite Abduction: AROM;5 reps Digit Composite Adduction: AROM;5 reps Large Pegboard: 15 pegs placed in baord with patient providing assist with RUE to lLUE to guide movement. Patient removed pegs with left hand and placed in tub. All pegs removed with assist from RUE except last 2 in which patient removed peg and raised shoulder without any facilitation.     Activities of Daily Living Activities of Daily Living: Patient participated in simple meal prep task of making cupcakes from box mix. Patient used left hand as stabilizer to hold containers and mixing bowl with mod assist due to lack of strength and stability in shoulder. Reviewed kitchen safety awareness when using overn to place cupcake pan in oven.   Occupational Therapy Assessment and Plan OT Assessment and Plan Clinical Impression Statement: A: Patient enjoyed cupcake meal prep. Stated that E-Stim seemed to help with shoulder pain. Educated patient on table top stretches to perform at home. Unfortunately, the house patient is in at the present does not have a table for him to use. Wife hopes that in the next few weeks they will be able to return to the camper. OT Plan: P: Do  E-Stim to to shoulder at end of tx session.   Goals Short Term Goals Time to Complete Short Term Goals: 6 weeks Short Term Goal 1: Patient will be educated on HEP. Short Term Goal 2: Patient will increase AROM in his left shoulder to 75% for increased ablility to reach for objects when completing functional activiites.  Short Term Goal 3: Patient will increase left grip strength by 10 pounds and pich strength by 5 pounds for increased ability to open containers when working on his cars. Short Term Goal 4: Patient will complete Nine Hole Peg Test in standardized fashion. Short Term Goal 5: Patient will attend to his left side 75% of the time when completing functional activities. Additional Short Term Goals?: Yes Short Term Goal 6: Patient will dress himself and bathe himself with min pa. Short Term Goal 7: Patient will decrease edema in left hand to minimal as he increases his functional use of his left hand.  Short Term Goal 8: Patient will use his left hand as a gross assist Ily with all daily activities. Short Term Goal 9: Patient will be able to reach forward and grasp his toothbrush with his left hand with minimal facilitation at his left shoulder.  Long Term Goals Time to Complete Long Term Goals: 12 weeks Long Term Goal 1: Patient will return to [facility] host activities and be able to feed himself a biscuit using his left hand. Long Term Goal 2: Patient will increase AROM in his left shoulder to 90% for increased ablility to reach for objects when completing functional activiites.  Long Term Goal 3: Patient will increase left grip strength by 40  pounds and pich strength by 10  pounds for increased ability to open containers when working on his cars. Long Term Goal 4: Patient will improve fine motor coordination in order to complete Nine Hole Peg Test in 69' or less. Long Term Goal 5: Patient will attend to his left side 95% of the time when completing functional activities. Additional  Long Term Goals?: Yes Long Term Goal 6: Patient will dress himself and bathe himself independently. Long Term Goal 7: Patient will improve to Brunnstrom stage V-VI in his left upper extremity in order to use his left arm as an active assist with all daily activiites.   Problem List Patient Active Problem List  Diagnosis  . Acute pancreatitis  . Cholelithiasis  . Hyperglycemia  . Current every day smoker  . Atrophy of right kidney  . Kidney calculi  . Hemiplegia, unspecified, affecting nondominant side  . Stented coronary artery  . Stroke, acute, embolic, right  . Seizure  . Cytotoxic cerebral edema  . Aortic thromboembolism  . Muscle weakness (generalized)  . Difficulty in walking  . Lack of coordination  . Left-sided neglect    End of Session Activity Tolerance: Patient tolerated treatment well General Behavior During Session: [facility] for tasks performed Cognition: [facility] for tasks performed   Limmie Patricia, OTR/L  07/06/2012, 12:04 PM

## 2012-07-08 ENCOUNTER — Ambulatory Visit (HOSPITAL_COMMUNITY): Payer: Medicare Other | Admitting: Specialist

## 2012-07-08 ENCOUNTER — Ambulatory Visit (HOSPITAL_COMMUNITY)
Admission: RE | Admit: 2012-07-08 | Discharge: 2012-07-08 | Disposition: A | Discharge status: Home / Self Care | Payer: Medicare Other | Source: Ambulatory Visit | Attending: Physical Medicine & Rehabilitation | Admitting: Physical Medicine & Rehabilitation

## 2012-07-08 DIAGNOSIS — R414 Neurologic neglect syndrome: Secondary | ICD-10-CM

## 2012-07-08 DIAGNOSIS — R279 Unspecified lack of coordination: Secondary | ICD-10-CM

## 2012-07-08 NOTE — Progress Notes (Signed)
Occupational Therapy Treatment Patient Details  Name: Shaun Pugh MRN: [id number] Date of Birth: [birth year]  Today's Date: 07/08/2012 Time: 4098-1191 OT Time Calculation (min): 50 min Neuro Reeducation 1025-1100 35' Estim 15' Visit#: 17 of 36  Re-eval: 07/22/12    Authorization: Medicare Blue   Authorization Time Period: before 20th visit   Authorization Visit#: 17 of 20  Subjective S:  I did the exercises that Vernona Rieger showed me on the table.    Exercise/Treatments Neurological Re-education Exercises    Development of Reach  Saebo Graduated Cylinders: seated at table with graduated cylinders positioned on table in front of patient and crate of Saebo balls to his left.  Patient was able to reach to his left, grasp a ball, and then place it in each cylinder x 20 reps.  Patient required min facilitation to unweight his arm and mod vg to incorporate elbow flexion and extension into activity for increased I reaching and dropping the balls.  I also provided tactile cues to maintain equal weight distribution between each sits bone. He completed this activity x 2  Grasp and Release Grasp and Release:  (with Saebo balls - I with grasp and release of 40 balls)  Fine Motor Coordination In Hand Manipulation Training: manipulating a small 2" by .5" object from a horizontal to vertical plane with max difficulty 5' to complete 1 peg.     Pharmacologist Location: posterior deltiod and supraspinatus Electrical Stimulation Parameters: Symmetric biphasic. Cycle: continuous. Freq: 35Hz . Channel 1 (supraspinatus): 15.44mV Channel 2: (deltiod) 4.46mV Time: 15'  Electrical Stimulation Goals: Pain Activities of Daily Living Activities of Daily Living: Patient demonstrated HEP:  table stretches using a plastic grocery bag to assist his left arm to slide x 1'.  I also educated him on a technique to complete for ER and patient demonstrated this x 1'  Occupational Therapy  Assessment and Plan OT Assessment and Plan Clinical Impression Statement: A:  Patient able to complete grasp and release of Saebo ball without difficulty and had greater control and less facilitation required with reaching activities with his left arm this date.  He had max difficulty manipulating a small 2" by .5" object from a horizontal to vertical plane this date.  OT Plan: P:  Continue E-stim for pain control as needed, review the necessity of elbow extension during reaching activity, decrease amount of facilitation required for reaching with left hand during functional activities.    Goals Short Term Goals Time to Complete Short Term Goals: 6 weeks Short Term Goal 1: Patient will be educated on HEP. Short Term Goal 2: Patient will increase AROM in his left shoulder to 75% for increased ablility to reach for objects when completing functional activiites.  Short Term Goal 3: Patient will increase left grip strength by 10 pounds and pich strength by 5 pounds for increased ability to open containers when working on his cars. Short Term Goal 4: Patient will complete Nine Hole Peg Test in standardized fashion. Short Term Goal 5: Patient will attend to his left side 75% of the time when completing functional activities. Additional Short Term Goals?: Yes Short Term Goal 6: Patient will dress himself and bathe himself with min pa. Short Term Goal 7: Patient will decrease edema in left hand to minimal as he increases his functional use of his left hand.  Short Term Goal 8: Patient will use his left hand as a gross assist Ily with all daily activities. Short Term Goal 9: Patient will be  able to reach forward and grasp his toothbrush with his left hand with minimal facilitation at his left shoulder.  Long Term Goals Time to Complete Long Term Goals: 12 weeks Long Term Goal 1: Patient will return to [facility] host activities and be able to feed himself a biscuit using his left hand. Long Term Goal 2:  Patient will increase AROM in his left shoulder to 90% for increased ablility to reach for objects when completing functional activiites.  Long Term Goal 3: Patient will increase left grip strength by 40 pounds and pich strength by 10  pounds for increased ability to open containers when working on his cars. Long Term Goal 4: Patient will improve fine motor coordination in order to complete Nine Hole Peg Test in 22' or less. Long Term Goal 5: Patient will attend to his left side 95% of the time when completing functional activities. Additional Long Term Goals?: Yes Long Term Goal 6: Patient will dress himself and bathe himself independently. Long Term Goal 7: Patient will improve to Brunnstrom stage V-VI in his left upper extremity in order to use his left arm as an active assist with all daily activiites.   Problem List Patient Active Problem List  Diagnosis  . Acute pancreatitis  . Cholelithiasis  . Hyperglycemia  . Current every day smoker  . Atrophy of right kidney  . Kidney calculi  . Hemiplegia, unspecified, affecting nondominant side  . Stented coronary artery  . Stroke, acute, embolic, right  . Seizure  . Cytotoxic cerebral edema  . Aortic thromboembolism  . Muscle weakness (generalized)  . Difficulty in walking  . Lack of coordination  . Left-sided neglect    End of Session Activity Tolerance: Patient tolerated treatment well General Behavior During Session: [facility] for tasks performed Cognition: [facility] for tasks performed  GO    [name], OTR/L  07/08/2012, 1:58 PM

## 2012-07-11 ENCOUNTER — Ambulatory Visit (HOSPITAL_COMMUNITY)
Admission: RE | Admit: 2012-07-11 | Discharge: 2012-07-11 | Disposition: A | Discharge status: Home / Self Care | Payer: Medicare Other | Source: Ambulatory Visit | Attending: Physical Medicine & Rehabilitation | Admitting: Physical Medicine & Rehabilitation

## 2012-07-11 DIAGNOSIS — M6281 Muscle weakness (generalized): Secondary | ICD-10-CM | POA: Insufficient documentation

## 2012-07-11 DIAGNOSIS — R414 Neurologic neglect syndrome: Secondary | ICD-10-CM

## 2012-07-11 DIAGNOSIS — R262 Difficulty in walking, not elsewhere classified: Secondary | ICD-10-CM | POA: Insufficient documentation

## 2012-07-11 DIAGNOSIS — IMO0001 Reserved for inherently not codable concepts without codable children: Secondary | ICD-10-CM | POA: Insufficient documentation

## 2012-07-11 DIAGNOSIS — R279 Unspecified lack of coordination: Secondary | ICD-10-CM | POA: Insufficient documentation
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
## 2012-07-13 ENCOUNTER — Ambulatory Visit (HOSPITAL_COMMUNITY)
Admission: RE | Admit: 2012-07-13 | Discharge: 2012-07-13 | Disposition: A | Discharge status: Home / Self Care | Payer: Medicare Other | Source: Ambulatory Visit | Attending: Physical Medicine & Rehabilitation | Admitting: Physical Medicine & Rehabilitation

## 2012-07-13 DIAGNOSIS — R414 Neurologic neglect syndrome: Secondary | ICD-10-CM

## 2012-07-13 DIAGNOSIS — R279 Unspecified lack of coordination: Secondary | ICD-10-CM

## 2012-07-13 NOTE — Progress Notes (Signed)
Occupational Therapy Treatment Patient Details  Name: Shaun Pugh MRN: [id number] Date of Birth: Dec 17, 1950  Today's Date: 07/13/2012 Time: 1353-1450 OT Time Calculation (min): 57 min MFR 1353-1410  17' NM re-ed 0865-7846 26' E-stim 1436-1450  14'  Visit#: 19 of 36  Re-eval: 07/22/12    Authorization: Medicare Blue   Authorization Time Period: before 40th visit  Authorization Visit#: 19 of 40  Subjective Symptoms/Limitations Symptoms: S: My hand isn't as swollen as it has been. Pain Assessment Currently in Pain?: No/denies  Precautions/Restrictions  Precautions Precautions: Fall  Exercise/Treatments Neurological Re-education Exercises Shoulder Flexion: AAROM;10 reps;Supine Shoulder Protraction: AAROM;10 reps;Supine Shoulder Horizontal ABduction: AAROM;10 reps;Supine Shoulder External Rotation: PROM;10 reps;Supine Shoulder Internal Rotation: PROM;10 reps;Supine Elbow Flexion: AAROM;10 reps;Supine Elbow Extension: AAROM;10 reps;Supine   Development of Reach  Weighted Closed Chain Exercises: Connect Four; placed 4 discs into game device with min physical assist for facilitation. max vc's for technique         Modalities Modalities: Copywriter, advertising Location: posterior deltiod and supraspinatus Electrical Stimulation Action: To reduce pain associated with shoulder subluxation Electrical Stimulation Parameters: Premod. Cycle: continuous 16.79mV Electrical Stimulation Goals: Pain  Occupational Therapy Assessment and Plan OT Assessment and Plan Clinical Impression Statement: A: Decreased spaticity noted in LUE. Patient able to perform AAROM supine for the first time today due to no pain in left shoulder with movement. OT Plan: P:  Continue E-stim for pain control as needed. Work on depressing shoulder during reach and grasp tasks and using elbow flexion more as well.   Goals Short Term Goals Time to Complete Short  Term Goals: 6 weeks Short Term Goal 1: Patient will be educated on HEP. Short Term Goal 2: Patient will increase AROM in his left shoulder to 75% for increased ablility to reach for objects when completing functional activiites.  Short Term Goal 3: Patient will increase left grip strength by 10 pounds and pich strength by 5 pounds for increased ability to open containers when working on his cars. Short Term Goal 4: Patient will complete Nine Hole Peg Test in standardized fashion. Short Term Goal 5: Patient will attend to his left side 75% of the time when completing functional activities. Additional Short Term Goals?: Yes Short Term Goal 6: Patient will dress himself and bathe himself with min pa. Short Term Goal 7: Patient will decrease edema in left hand to minimal as he increases his functional use of his left hand.  Short Term Goal 8: Patient will use his left hand as a gross assist Ily with all daily activities. Short Term Goal 9: Patient will be able to reach forward and grasp his toothbrush with his left hand with minimal facilitation at his left shoulder.  Long Term Goals Time to Complete Long Term Goals: 12 weeks Long Term Goal 1: Patient will return to [facility] host activities and be able to feed himself a biscuit using his left hand. Long Term Goal 2: Patient will increase AROM in his left shoulder to 90% for increased ablility to reach for objects when completing functional activiites.  Long Term Goal 3: Patient will increase left grip strength by 40 pounds and pich strength by 10  pounds for increased ability to open containers when working on his cars. Long Term Goal 4: Patient will improve fine motor coordination in order to complete Nine Hole Peg Test in 64' or less. Long Term Goal 5: Patient will attend to his left side 95% of the time when completing functional  activities. Additional Long Term Goals?: Yes Long Term Goal 6: Patient will dress himself and bathe himself  independently. Long Term Goal 7: Patient will improve to Brunnstrom stage V-VI in his left upper extremity in order to use his left arm as an active assist with all daily activiites.   Problem List Patient Active Problem List  Diagnosis  . Acute pancreatitis  . Cholelithiasis  . Hyperglycemia  . Current every day smoker  . Atrophy of right kidney  . Kidney calculi  . Hemiplegia, unspecified, affecting nondominant side  . Stented coronary artery  . Stroke, acute, embolic, right  . Seizure  . Cytotoxic cerebral edema  . Aortic thromboembolism  . Muscle weakness (generalized)  . Difficulty in walking  . Lack of coordination  . Left-sided neglect    End of Session Activity Tolerance: Patient tolerated treatment well General Behavior During Session: [facility] for tasks performed Cognition: [facility] for tasks performed  GO Functional Assessment Tool Used: FAQ scored 40/70 = 57% I level with daily actvities. Functional Limitation: Self care Self Care Current Status [phone]): At least 40 percent but less than 60 percent impaired, limited or restricted Self Care Goal Status (U0454): At least 1 percent but less than 20 percent impaired, limited or restricted  Limmie Patricia, OTR/L  07/13/2012, 3:12 PM

## 2012-07-15 ENCOUNTER — Inpatient Hospital Stay (HOSPITAL_COMMUNITY): Admission: RE | Admit: 2012-07-15 | Payer: Medicare Other | Source: Ambulatory Visit

## 2012-07-18 ENCOUNTER — Ambulatory Visit (HOSPITAL_COMMUNITY)
Admission: RE | Admit: 2012-07-18 | Discharge: 2012-07-18 | Disposition: A | Discharge status: Home / Self Care | Payer: Medicare Other | Source: Ambulatory Visit | Attending: Physical Medicine & Rehabilitation | Admitting: Physical Medicine & Rehabilitation

## 2012-07-18 DIAGNOSIS — R414 Neurologic neglect syndrome: Secondary | ICD-10-CM

## 2012-07-18 DIAGNOSIS — R279 Unspecified lack of coordination: Secondary | ICD-10-CM

## 2012-07-18 NOTE — Progress Notes (Signed)
Occupational Therapy Treatment Patient Details  Name: Shaun Pugh MRN: [id number] Date of Birth: 1950-11-22  Today's Date: 07/18/2012 Time: 8469-6295 OT Time Calculation (min): 73 min NM re-ed 2841-3244 28' E-Stim 1345-1415 30'  Visit#: 20 of 36  Re-eval: 07/22/12    Authorization: Medicare Blue   Authorization Time Period: before 40th visit  Authorization Visit#: 20 of 40  Subjective Symptoms/Limitations Symptoms: S: I was really sick over the weekend. I got weak from that. Pain Assessment Currently in Pain?: No/denies  Precautions/Restrictions     Exercise/Treatments Hand Exercises MCPJ Flexion: PROM;5 reps MCPJ Extension: PROM;5 reps PIPJ Flexion: PROM;5 reps PIPJ Extension: PROM;5 reps DIPJ Flexion: PROM;5 reps DIPJ Extension: PROM;5 reps Large Pegboard: 5 pegs placed in board with min vc's to depress shoulder. mod assist at times to help faciliate forearm to guide movement. Performed with E-Stim to faciliate wrist extension.  Neurological Re-education Exercises Shoulder Flexion: AAROM;10 reps;Supine Shoulder Protraction: AAROM;10 reps;Supine Shoulder Horizontal ABduction: AAROM;10 reps;Supine Shoulder External Rotation: PROM;10 reps;Supine Shoulder Internal Rotation: PROM;10 reps;Supine Elbow Flexion: AROM;10 reps;Supine Elbow Extension: AROM;10 reps;Supine   Fine Motor Coordination Large Pegboard: 5 pegs placed in board with min vc's to depress shoulder. mod assist at times to help faciliate forearm to guide movement. Performed with E-Stim to faciliate wrist extension.     Modalities Modalities: Copywriter, advertising Location: posterior deltiod and supraspinatus (2nd placement) wrist extensor region (1st placement) Electrical Stimulation Action: To reduce pain associated with shoulder subluxation (2nd placement). To facilitate wrist extension (1st placement) Electrical Stimulation Parameters: Premod. Cycle:  continuous 16.76mV (2nd placement) [place]. Cycle: 10/30 29 mV (1st placement) Electrical Stimulation Goals: Neuromuscular facilitation;Pain  Occupational Therapy Assessment and Plan OT Assessment and Plan Clinical Impression Statement: A: Signficant decrease in edema noted in Lt hand. Pt completing table top activities with less cues to depress shoulder due to decreased tone and tightness. Complained of Lt wrist pain at times during the day. Performed [place] E-stim to decrease tightness and increase joint mobility. OT Plan: P:  Continue E-stim for pain control as needed. Work on depressing shoulder during reach and grasp tasks and using elbow flexion more as well.   Goals Short Term Goals Time to Complete Short Term Goals: 6 weeks Short Term Goal 1: Patient will be educated on HEP. Short Term Goal 1 Progress: Met Short Term Goal 2: Patient will increase AROM in his left shoulder to 75% for increased ablility to reach for objects when completing functional activiites.  Short Term Goal 2 Progress: Progressing toward goal Short Term Goal 3: Patient will increase left grip strength by 10 pounds and pich strength by 5 pounds for increased ability to open containers when working on his cars. Short Term Goal 3 Progress: Progressing toward goal Short Term Goal 4: Patient will complete Nine Hole Peg Test in standardized fashion. Short Term Goal 4 Progress: Progressing toward goal Short Term Goal 5: Patient will attend to his left side 75% of the time when completing functional activities. Short Term Goal 5 Progress: Progressing toward goal Additional Short Term Goals?: Yes Short Term Goal 6: Patient will dress himself and bathe himself with min pa. Short Term Goal 6 Progress: Progressing toward goal Short Term Goal 7: Patient will decrease edema in left hand to minimal as he increases his functional use of his left hand.  Short Term Goal 7 Progress: Met Short Term Goal 8: Patient will use his  left hand as a gross assist Ily with all daily activities.  Short Term Goal 8 Progress: Progressing toward goal Short Term Goal 9: Patient will be able to reach forward and grasp his toothbrush with his left hand with minimal facilitation at his left shoulder.  Short Term Goal 9 Progress: Progressing toward goal Long Term Goals Time to Complete Long Term Goals: 12 weeks Long Term Goal 1: Patient will return to [facility] host activities and be able to feed himself a biscuit using his left hand. Long Term Goal 1 Progress: Progressing toward goal Long Term Goal 2: Patient will increase AROM in his left shoulder to 90% for increased ablility to reach for objects when completing functional activiites.  Long Term Goal 2 Progress: Progressing toward goal Long Term Goal 3: Patient will increase left grip strength by 40 pounds and pich strength by 10  pounds for increased ability to open containers when working on his cars. Long Term Goal 3 Progress: Progressing toward goal Long Term Goal 4: Patient will improve fine motor coordination in order to complete Nine Hole Peg Test in 45' or less. Long Term Goal 4 Progress: Progressing toward goal Long Term Goal 5: Patient will attend to his left side 95% of the time when completing functional activities. Long Term Goal 5 Progress: Met Additional Long Term Goals?: Yes Long Term Goal 6: Patient will dress himself and bathe himself independently. Long Term Goal 6 Progress: Progressing toward goal Long Term Goal 7: Patient will improve to Brunnstrom stage V-VI in his left upper extremity in order to use his left arm as an active assist with all daily activiites.  Long Term Goal 7 Progress: Progressing toward goal  Problem List Patient Active Problem List  Diagnosis  . Acute pancreatitis  . Cholelithiasis  . Hyperglycemia  . Current every day smoker  . Atrophy of right kidney  . Kidney calculi  . Hemiplegia, unspecified, affecting nondominant side  .  Stented coronary artery  . Stroke, acute, embolic, right  . Seizure  . Cytotoxic cerebral edema  . Aortic thromboembolism  . Muscle weakness (generalized)  . Difficulty in walking  . Lack of coordination  . Left-sided neglect    End of Session Activity Tolerance: Patient tolerated treatment well General Behavior During Session: [facility] for tasks performed Cognition: [facility] for tasks performed   Limmie Patricia, OTR/L  07/18/2012, 2:28 PM

## 2012-07-20 ENCOUNTER — Ambulatory Visit (HOSPITAL_COMMUNITY): Payer: Medicare Other | Admitting: Specialist

## 2012-07-21 ENCOUNTER — Other Ambulatory Visit: Payer: Self-pay | Admitting: Neurology

## 2012-07-21 ENCOUNTER — Ambulatory Visit (HOSPITAL_COMMUNITY)
Admission: RE | Admit: 2012-07-21 | Discharge: 2012-07-21 | Disposition: A | Discharge status: Home / Self Care | Payer: Medicare Other | Source: Ambulatory Visit | Attending: Physical Medicine & Rehabilitation | Admitting: Physical Medicine & Rehabilitation

## 2012-07-21 ENCOUNTER — Ambulatory Visit: Payer: Self-pay

## 2012-07-21 DIAGNOSIS — R414 Neurologic neglect syndrome: Secondary | ICD-10-CM

## 2012-07-21 DIAGNOSIS — I6350 Cerebral infarction due to unspecified occlusion or stenosis of unspecified cerebral artery: Secondary | ICD-10-CM

## 2012-07-21 DIAGNOSIS — R279 Unspecified lack of coordination: Secondary | ICD-10-CM

## 2012-07-21 NOTE — Progress Notes (Signed)
Occupational Therapy Treatment Patient Details  Name: Shaun Pugh MRN: [id number] Date of Birth: 1950/08/02  Today's Date: 07/21/2012 Time: 1345-1450 OT Time Calculation (min): 65 min Reassess 4098-1191 50' E-stim 1435-1450 15'  Visit#: 21 of 36  Re-eval: 07/22/12    Authorization: Medicare Blue   Authorization Time Period: before 40th visit  Authorization Visit#: 21 of 40  Subjective Symptoms/Limitations Symptoms: S: I saw the neurologist yesterday and he pulled my arm in all sorts of directions. He says I should get Botox. Pain Assessment Currently in Pain?: No/denies  Precautions/Restrictions  Precautions Precautions: Fall  Exercise/Treatments    Modalities Modalities: Copywriter, advertising Location: wrist extensor region  Statistician Action: To facilitate wrist extension  Electrical Stimulation Parameters: [place]. Cycle: 10/30 26 mV Electrical Stimulation Goals: Neuromuscular facilitation;Pain  Occupational Therapy Assessment and Plan OT Assessment and Plan Clinical Impression Statement: A: See MD note for progress. Pt completed 9 hole peg test in standardized fashion for the first time this date! Edema no longer present in Lt hand.  OT Plan: P:  Continue E-stim for pain control as needed. Work on depressing shoulder during reach and grasp tasks and using elbow flexion more as well.   Goals Short Term Goals Time to Complete Short Term Goals: 6 weeks Short Term Goal 1: Patient will be educated on HEP. Short Term Goal 2: Patient will increase AROM in his left shoulder to 75% for increased ablility to reach for objects when completing functional activiites.  Short Term Goal 2 Progress: Progressing toward goal Short Term Goal 3: Patient will increase left grip strength by 10 pounds and pinch strength by 5 pounds for increased ability to open containers when working on his cars. Short Term Goal 3  Progress: Met Short Term Goal 4: Patient will complete Nine Hole Peg Test in standardized fashion. Short Term Goal 4 Progress: Met Short Term Goal 5: Patient will attend to his left side 75% of the time when completing functional activities. Short Term Goal 5 Progress: Met Additional Short Term Goals?: Yes Short Term Goal 6: Patient will dress himself and bathe himself with min pa. Short Term Goal 6 Progress: Progressing toward goal Short Term Goal 7: Patient will decrease edema in left hand to minimal as he increases his functional use of his left hand.  Short Term Goal 8: Patient will use his left hand as a gross assist Ily with all daily activities. Short Term Goal 8 Progress: Progressing toward goal Short Term Goal 9: Patient will be able to reach forward and grasp his toothbrush with his left hand with minimal facilitation at his left shoulder.  Short Term Goal 9 Progress: Met Long Term Goals Time to Complete Long Term Goals: 12 weeks Long Term Goal 1: Patient will return to [facility] host activities and be able to feed himself a biscuit using his left hand. Long Term Goal 1 Progress: Progressing toward goal Long Term Goal 2: Patient will increase AROM in his left shoulder to 90% for increased ablility to reach for objects when completing functional activiites.  Long Term Goal 2 Progress: Progressing toward goal Long Term Goal 3: Patient will increase left grip strength by 40 pounds and pich strength by 10  pounds for increased ability to open containers when working on his cars. Long Term Goal 3 Progress: Progressing toward goal Long Term Goal 4: Patient will improve fine motor coordination in order to complete Nine Hole Peg Test in 45' or less. Long Term Goal  4 Progress: Progressing toward goal Long Term Goal 5: Patient will attend to his left side 95% of the time when completing functional activities. Additional Long Term Goals?: Yes Long Term Goal 6: Patient will dress himself and  bathe himself independently. Long Term Goal 6 Progress: Progressing toward goal Long Term Goal 7: Patient will improve to Brunnstrom stage V-VI in his left upper extremity in order to use his left arm as an active assist with all daily activiites.  Long Term Goal 7 Progress: Progressing toward goal  Problem List Patient Active Problem List  Diagnosis  . Acute pancreatitis  . Cholelithiasis  . Hyperglycemia  . Current every day smoker  . Atrophy of right kidney  . Kidney calculi  . Hemiplegia, unspecified, affecting nondominant side  . Stented coronary artery  . Stroke, acute, embolic, right  . Seizure  . Cytotoxic cerebral edema  . Aortic thromboembolism  . Muscle weakness (generalized)  . Difficulty in walking  . Lack of coordination  . Left-sided neglect    End of Session Activity Tolerance: Patient tolerated treatment well General Behavior During Session: [facility] for tasks performed Cognition: [facility] for tasks performed   Limmie Patricia, OTR/L  07/21/2012, 3:50 PM

## 2012-07-22 ENCOUNTER — Ambulatory Visit (HOSPITAL_COMMUNITY)
Admission: RE | Admit: 2012-07-22 | Discharge: 2012-07-22 | Disposition: A | Discharge status: Home / Self Care | Payer: Medicare Other | Source: Ambulatory Visit | Attending: Physical Medicine & Rehabilitation | Admitting: Physical Medicine & Rehabilitation

## 2012-07-22 DIAGNOSIS — R414 Neurologic neglect syndrome: Secondary | ICD-10-CM

## 2012-07-22 DIAGNOSIS — R279 Unspecified lack of coordination: Secondary | ICD-10-CM

## 2012-07-22 NOTE — Progress Notes (Signed)
Occupational Therapy Treatment Patient Details  Name: YADER CRIGER MRN: [id number] Date of Birth: 20-Jul-1950  Today's Date: 07/22/2012 Time: 4098-1191 OT Time Calculation (min): 45 min NM re-ed 4782-9562 45'  Visit#: 22 of 36  Re-eval: 08/18/12    Authorization: Medicare Blue   Authorization Time Period: before 40th visit  Authorization Visit#: 22 of 40  Subjective Symptoms/Limitations Symptoms: S: My wrist is starting to feel better.  Pain Assessment Currently in Pain?: No/denies  Precautions/Restrictions  Precautions Precautions: Fall  Exercise/Treatments Neurological Re-education Exercises Sponges: gross grasp and release; 4X; emphasis on extension of all fingers, grabbing, and finally extention of fingers to release sponges into high bucket. No facilitation provided at forearm.   Grasp and Release Grasp and Release: Thumb Opposition;Digit Abduction/Adduction Thumb Opposition: [place] Manipulative Rate Test (MMRT) Board used to faciliate thumb opposition and in hand manipulation to flip discs from black to red  Fine Motor Coordination Fine Motor Coordination: With MMRT discs pt required to reach and grasp disc from top row and place in bottom row working on grasp and release of left hand.     Activities of Daily Living Activities of Daily Living: Pt demonstrated donning and doffing of right sock and shoes without assistance. Min vc's initially for technique. Unable to perform with left sock and shoe due to time constraint.  Occupational Therapy Assessment and Plan OT Assessment and Plan Clinical Impression Statement: A: Left hand MCP joint still present with pain with movement. Pt presented with increased joint movement in left hand and control with activities. Decreased tremors during tasks. OT Plan: P:  Continue E-stim for pain control as needed. Work on depressing shoulder during reach and grasp tasks and using elbow flexion more as well.   Goals Short Term  Goals Time to Complete Short Term Goals: 6 weeks Short Term Goal 1: Patient will be educated on HEP. Short Term Goal 2: Patient will increase AROM in his left shoulder to 75% for increased ablility to reach for objects when completing functional activiites.  Short Term Goal 3: Patient will increase left grip strength by 10 pounds and pinch strength by 5 pounds for increased ability to open containers when working on his cars. Short Term Goal 4: Patient will complete Nine Hole Peg Test in standardized fashion. Short Term Goal 5: Patient will attend to his left side 75% of the time when completing functional activities. Additional Short Term Goals?: Yes Short Term Goal 6: Patient will dress himself and bathe himself with min pa. Short Term Goal 7: Patient will decrease edema in left hand to minimal as he increases his functional use of his left hand.  Short Term Goal 8: Patient will use his left hand as a gross assist Ily with all daily activities. Short Term Goal 9: Patient will be able to reach forward and grasp his toothbrush with his left hand with minimal facilitation at his left shoulder.  Long Term Goals Time to Complete Long Term Goals: 12 weeks Long Term Goal 1: Patient will return to [facility] host activities and be able to feed himself a biscuit using his left hand. Long Term Goal 2: Patient will increase AROM in his left shoulder to 90% for increased ablility to reach for objects when completing functional activiites.  Long Term Goal 3: Patient will increase left grip strength by 40 pounds and pich strength by 10  pounds for increased ability to open containers when working on his cars. Long Term Goal 4: Patient will improve fine motor coordination  in order to complete Nine Hole Peg Test in 45' or less. Long Term Goal 5: Patient will attend to his left side 95% of the time when completing functional activities. Additional Long Term Goals?: Yes Long Term Goal 6: Patient will dress  himself and bathe himself independently. Long Term Goal 7: Patient will improve to Brunnstrom stage V-VI in his left upper extremity in order to use his left arm as an active assist with all daily activiites.   Problem List Patient Active Problem List  Diagnosis  . Acute pancreatitis  . Cholelithiasis  . Hyperglycemia  . Current every day smoker  . Atrophy of right kidney  . Kidney calculi  . Hemiplegia, unspecified, affecting nondominant side  . Stented coronary artery  . Stroke, acute, embolic, right  . Seizure  . Cytotoxic cerebral edema  . Aortic thromboembolism  . Muscle weakness (generalized)  . Difficulty in walking  . Lack of coordination  . Left-sided neglect    End of Session Activity Tolerance: Patient tolerated treatment well General Behavior During Session: [facility] for tasks performed Cognition: [facility] for tasks performed   Limmie Patricia, OTR/L  07/22/2012, 2:05 PM

## 2012-07-25 ENCOUNTER — Ambulatory Visit (HOSPITAL_COMMUNITY)
Admission: RE | Admit: 2012-07-25 | Discharge: 2012-07-25 | Disposition: A | Discharge status: Home / Self Care | Payer: Medicare Other | Source: Ambulatory Visit | Attending: Physical Medicine & Rehabilitation | Admitting: Physical Medicine & Rehabilitation

## 2012-07-25 DIAGNOSIS — R279 Unspecified lack of coordination: Secondary | ICD-10-CM

## 2012-07-25 DIAGNOSIS — R414 Neurologic neglect syndrome: Secondary | ICD-10-CM

## 2012-07-25 NOTE — Progress Notes (Signed)
Occupational Therapy Treatment Patient Details  Name: Shaun Pugh MRN: [id number] Date of Birth: [birth year]  Today's Date: 07/25/2012 Time: 4098-1191 OT Time Calculation (min): 45 min NM re-ed 1300-1345 45'  Visit#: 23 of 36  Re-eval: 08/18/12    Authorization: Medicare Blue   Authorization Time Period: before 40th visit  Authorization Visit#: 23 of 40  Subjective Symptoms/Limitations Symptoms: S: I was able to put my right sock on but I got Cindy to help with the left. I felt like I was going to fall over with the left sock. Pain Assessment Currently in Pain?: No/denies  Precautions/Restrictions  Precautions Precautions: Fall  Exercise/Treatments Neurological Re-education  Grasp and Release Grasp and Release: Theraputty Theraputty - Flatten: yellow Theraputty - Locate Pegs: 6 beads; yellow  Fine Motor Coordination Large Pegboard: 10 pegs placed in board with min vc's to depress shoulder. Performed with E-Stim to faciliate wrist extension.     Modalities Modalities: Copywriter, advertising Location: wrist extensor region  Electrical Stimulation Action: To facilitate wrist extension  Electrical Stimulation Parameters: [place]. Cycle: 10/30 33 mV Electrical Stimulation Goals: Neuromuscular facilitation;Pain  Occupational Therapy Assessment and Plan OT Assessment and Plan Clinical Impression Statement: A: Tremors present in LUE wrist when fatigued this session. Increased control of wrist extension and finger pinch. OT Plan: P:  Continue E-stim for pain control as needed. Work on depressing shoulder during reach and grasp tasks and fine motor coordination activities.   Goals Short Term Goals Time to Complete Short Term Goals: 6 weeks Short Term Goal 1: Patient will be educated on HEP. Short Term Goal 2: Patient will increase AROM in his left shoulder to 75% for increased ablility to reach for objects when completing  functional activiites.  Short Term Goal 2 Progress: Progressing toward goal Short Term Goal 3: Patient will increase left grip strength by 10 pounds and pinch strength by 5 pounds for increased ability to open containers when working on his cars. Short Term Goal 4: Patient will complete Nine Hole Peg Test in standardized fashion. Short Term Goal 5: Patient will attend to his left side 75% of the time when completing functional activities. Additional Short Term Goals?: Yes Short Term Goal 6: Patient will dress himself and bathe himself with min pa. Short Term Goal 6 Progress: Progressing toward goal Short Term Goal 7: Patient will decrease edema in left hand to minimal as he increases his functional use of his left hand.  Short Term Goal 8: Patient will use his left hand as a gross assist Ily with all daily activities. Short Term Goal 8 Progress: Progressing toward goal Short Term Goal 9: Patient will be able to reach forward and grasp his toothbrush with his left hand with minimal facilitation at his left shoulder.  Long Term Goals Time to Complete Long Term Goals: 12 weeks Long Term Goal 1: Patient will return to [facility] host activities and be able to feed himself a biscuit using his left hand. Long Term Goal 1 Progress: Progressing toward goal Long Term Goal 2: Patient will increase AROM in his left shoulder to 90% for increased ablility to reach for objects when completing functional activiites.  Long Term Goal 2 Progress: Progressing toward goal Long Term Goal 3: Patient will increase left grip strength by 40 pounds and pich strength by 10  pounds for increased ability to open containers when working on his cars. Long Term Goal 3 Progress: Progressing toward goal Long Term Goal 4: Patient will improve fine  motor coordination in order to complete Nine Hole Peg Test in 45' or less. Long Term Goal 4 Progress: Progressing toward goal Long Term Goal 5: Patient will attend to his left side 95%  of the time when completing functional activities. Additional Long Term Goals?: Yes Long Term Goal 6: Patient will dress himself and bathe himself independently. Long Term Goal 6 Progress: Progressing toward goal Long Term Goal 7: Patient will improve to Brunnstrom stage V-VI in his left upper extremity in order to use his left arm as an active assist with all daily activiites.  Long Term Goal 7 Progress: Progressing toward goal  Problem List Patient Active Problem List  Diagnosis  . Acute pancreatitis  . Cholelithiasis  . Hyperglycemia  . Current every day smoker  . Atrophy of right kidney  . Kidney calculi  . Hemiplegia, unspecified, affecting nondominant side  . Stented coronary artery  . Stroke, acute, embolic, right  . Seizure  . Cytotoxic cerebral edema  . Aortic thromboembolism  . Muscle weakness (generalized)  . Difficulty in walking  . Lack of coordination  . Left-sided neglect    End of Session Activity Tolerance: Patient tolerated treatment well General Behavior During Session: [facility] for tasks performed Cognition: [facility] for tasks performed   Limmie Patricia, OTR/L  07/25/2012, 2:59 PM

## 2012-07-27 ENCOUNTER — Other Ambulatory Visit: Payer: Self-pay

## 2012-07-27 ENCOUNTER — Ambulatory Visit (HOSPITAL_COMMUNITY)
Admission: RE | Admit: 2012-07-27 | Discharge: 2012-07-27 | Disposition: A | Discharge status: Home / Self Care | Payer: Medicare Other | Source: Ambulatory Visit | Attending: Physical Medicine & Rehabilitation | Admitting: Physical Medicine & Rehabilitation

## 2012-07-27 DIAGNOSIS — R414 Neurologic neglect syndrome: Secondary | ICD-10-CM

## 2012-07-27 DIAGNOSIS — R279 Unspecified lack of coordination: Secondary | ICD-10-CM

## 2012-07-27 NOTE — Progress Notes (Signed)
Occupational Therapy Treatment Patient Details  Name: Shaun Pugh MRN: [id number] Date of Birth: [birth year]  Today's Date: 07/27/2012 Time: 4098-1191 OT Time Calculation (min): 60 min NM re-ed 4782-9562 20' MFR 1308-6578 8'  NM re-ed 4696-2952 32'  Visit#: 24 of 36  Re-eval: 08/18/12    Authorization: Medicare Blue   Authorization Time Period: before 40th visit  Authorization Visit#: 24 of 40  Subjective Symptoms/Limitations Symptoms: S: I put my right sock on but I'm still having trouble with my shirt. I get frustrated and throw it on the floor. Pain Assessment Currently in Pain?: No/denies  Precautions/Restrictions  Precautions Precautions: Fall  Exercise/Treatments Hand Exercises Digit Composite Abduction: AROM;10 reps;PROM Digit Composite Adduction: AROM;10 reps;PROM Opposition: AROM;10 reps;PROM Digit Abduction/Adduction: 10X  Neurological Re-education  Development of Reach  Development of Reach: [place] Journey Ring Tree: Patient sat at table and grasped balls atnd placed on yellow base with min facilitation at shoulder. Verbal cues for technique and to take a deep breath and hold to help stabilize core and help hold arm steady. Saebo Supination: pt completed 3X with ball cylinder on table top. Pt moved from pronation to neutral position. Difficulty with spherical grasp on balls due to thumb pain. Min assist to facilitate shoulder.  Grasp and Release Digit Abduction/Adduction: 10X  Fine Motor Coordination Digit Composite Abduction: AROM;10 reps;PROM Digit Composite Adduction: AROM;10 reps;PROM Opposition: AROM;10 reps;PROM     Manual Therapy Manual Therapy: Myofascial release Myofascial Release: MFR and manual stretching to left shoulder and bicep region to decrease pain and increase active movement. Splinting Splinting: Kinesiotape applied to [name] and Lt wrist to provide joint stability and to decrease pain level. Sport tape applied to Lt thumb MCP joint  to provide joint stability and decrease pain.  Activities of Daily Living Activities of Daily Living: Pt demonstrated doffing shirt with verbal cues only for technique.   Occupational Therapy Assessment and Plan OT Assessment and Plan Clinical Impression Statement: A: [name] slightly stiff this date. MFR performed to decrease joint stiffness and mobility. Applied kinesiotape to lt shoulder, wrist and thumb.  OT Plan: P: E-stim to [name] and wrist. Work on UB dressing.   Goals Short Term Goals Time to Complete Short Term Goals: 6 weeks Short Term Goal 1: Patient will be educated on HEP. Short Term Goal 2: Patient will increase AROM in his left shoulder to 75% for increased ablility to reach for objects when completing functional activiites.  Short Term Goal 2 Progress: Progressing toward goal Short Term Goal 3: Patient will increase left grip strength by 10 pounds and pinch strength by 5 pounds for increased ability to open containers when working on his cars. Short Term Goal 4: Patient will complete Nine Hole Peg Test in standardized fashion. Short Term Goal 5: Patient will attend to his left side 75% of the time when completing functional activities. Additional Short Term Goals?: Yes Short Term Goal 6: Patient will dress himself and bathe himself with min pa. Short Term Goal 6 Progress: Progressing toward goal Short Term Goal 7: Patient will decrease edema in left hand to minimal as he increases his functional use of his left hand.  Short Term Goal 8: Patient will use his left hand as a gross assist Ily with all daily activities. Short Term Goal 8 Progress: Progressing toward goal Short Term Goal 9: Patient will be able to reach forward and grasp his toothbrush with his left hand with minimal facilitation at his left shoulder.  Long Term  Goals Time to Complete Long Term Goals: 12 weeks Long Term Goal 1: Patient will return to [facility] host activities and be able to feed  himself a biscuit using his left hand. Long Term Goal 1 Progress: Progressing toward goal Long Term Goal 2: Patient will increase AROM in his left shoulder to 90% for increased ablility to reach for objects when completing functional activiites.  Long Term Goal 2 Progress: Progressing toward goal Long Term Goal 3: Patient will increase left grip strength by 40 pounds and pich strength by 10  pounds for increased ability to open containers when working on his cars. Long Term Goal 3 Progress: Progressing toward goal Long Term Goal 4: Patient will improve fine motor coordination in order to complete Nine Hole Peg Test in 45' or less. Long Term Goal 4 Progress: Progressing toward goal Long Term Goal 5: Patient will attend to his left side 95% of the time when completing functional activities. Additional Long Term Goals?: Yes Long Term Goal 6: Patient will dress himself and bathe himself independently. Long Term Goal 7: Patient will improve to Brunnstrom stage V-VI in his left upper extremity in order to use his left arm as an active assist with all daily activiites.   Problem List Patient Active Problem List  Diagnosis  . Acute pancreatitis  . Cholelithiasis  . Hyperglycemia  . Current every day smoker  . Atrophy of right kidney  . Kidney calculi  . Hemiplegia, unspecified, affecting nondominant side  . Stented coronary artery  . Stroke, acute, embolic, right  . Seizure  . Cytotoxic cerebral edema  . Aortic thromboembolism  . Muscle weakness (generalized)  . Difficulty in walking  . Lack of coordination  . Left-sided neglect    End of Session Activity Tolerance: Patient tolerated treatment well General Behavior During Session: [facility] for tasks performed Cognition: [facility] for tasks performed   Limmie Patricia, OTR/L  07/27/2012, 4:32 PM

## 2012-07-28 ENCOUNTER — Ambulatory Visit (INDEPENDENT_AMBULATORY_CARE_PROVIDER_SITE_OTHER): Payer: Medicare Other

## 2012-07-28 ENCOUNTER — Other Ambulatory Visit: Payer: Self-pay

## 2012-07-28 DIAGNOSIS — I6350 Cerebral infarction due to unspecified occlusion or stenosis of unspecified cerebral artery: Secondary | ICD-10-CM

## 2012-07-28 DIAGNOSIS — Z0289 Encounter for other administrative examinations: Secondary | ICD-10-CM

## 2012-07-28 DIAGNOSIS — I6529 Occlusion and stenosis of unspecified carotid artery: Secondary | ICD-10-CM

## 2012-07-29 ENCOUNTER — Ambulatory Visit (HOSPITAL_COMMUNITY)
Admission: RE | Admit: 2012-07-29 | Discharge: 2012-07-29 | Disposition: A | Discharge status: Home / Self Care | Payer: Medicare Other | Source: Ambulatory Visit | Attending: Physical Medicine & Rehabilitation | Admitting: Physical Medicine & Rehabilitation

## 2012-07-29 DIAGNOSIS — R414 Neurologic neglect syndrome: Secondary | ICD-10-CM

## 2012-07-29 DIAGNOSIS — R279 Unspecified lack of coordination: Secondary | ICD-10-CM

## 2012-07-29 NOTE — Progress Notes (Signed)
Occupational Therapy Treatment Patient Details  Name: KENLY XIAO MRN: [id number] Date of Birth: [birth year]  Today's Date: 07/29/2012 Time: 4098-1191 OT Time Calculation (min): 40 min Neuro re-ed 40'  Visit#: 25 of 36  Re-eval: 08/18/12    Authorization: Medicare Blue   Authorization Time Period: before 40th visit  Authorization Visit#: 25 of 40  Subjective Symptoms/Limitations Symptoms: S:  My thumb hurts today, I got it stuck on something Pain Assessment Currently in Pain?: No/denies  Precautions/Restrictions     Exercise/Treatments Neurological Re-education Exercises Shoulder Flexion: AAROM;10 reps;Seated Shoulder ABduction: AAROM;10 reps;Seated Shoulder Protraction: AAROM;10 reps;Seated Shoulder External Rotation: AAROM;10 reps;Seated Elbow Flexion: AROM;10 reps;Seated Elbow Extension: AROM;10 reps;Seated Forearm Supination: AAROM;10 reps;Seated Forearm Pronation: AAROM;10 reps;Seated Wrist Flexion: AAROM;10 reps;Seated Wrist Extension: AAROM;10 reps;Seated  Development of Reach  Development of Reach: Saebo Hand over Hand Assistance while Reaching: using 5 hole peg SAEBO Saebo Crate; Right: with HOH assist Saebo Crate; Left: with HOH assist  Grasp and Release Digit Abduction/Adduction: 10X   Occupational Therapy Assessment and Plan OT Assessment and Plan Clinical Impression Statement: A:  Educated pt. on stretching wrist and digits and gently abducting thumb for self stretch when he begins to have increased spasticity with active reaching. Declined electric stim for shoulder and wrist. OT Plan: P:  Work on UB dressing.   Goals Short Term Goals Time to Complete Short Term Goals: 6 weeks Short Term Goal 1: Patient will be educated on HEP. Short Term Goal 2: Patient will increase AROM in his left shoulder to 75% for increased ablility to reach for objects when completing functional activiites.  Short Term Goal 3: Patient will increase left grip strength by  10 pounds and pinch strength by 5 pounds for increased ability to open containers when working on his cars. Short Term Goal 4: Patient will complete Nine Hole Peg Test in standardized fashion. Short Term Goal 5: Patient will attend to his left side 75% of the time when completing functional activities. Additional Short Term Goals?: Yes Short Term Goal 6: Patient will dress himself and bathe himself with min pa. Short Term Goal 7: Patient will decrease edema in left hand to minimal as he increases his functional use of his left hand.  Short Term Goal 8: Patient will use his left hand as a gross assist Ily with all daily activities. Short Term Goal 9: Patient will be able to reach forward and grasp his toothbrush with his left hand with minimal facilitation at his left shoulder.  Long Term Goals Time to Complete Long Term Goals: 12 weeks Long Term Goal 1: Patient will return to [facility] host activities and be able to feed himself a biscuit using his left hand. Long Term Goal 2: Patient will increase AROM in his left shoulder to 90% for increased ablility to reach for objects when completing functional activiites.  Long Term Goal 3: Patient will increase left grip strength by 40 pounds and pich strength by 10  pounds for increased ability to open containers when working on his cars. Long Term Goal 4: Patient will improve fine motor coordination in order to complete Nine Hole Peg Test in 49' or less. Long Term Goal 5: Patient will attend to his left side 95% of the time when completing functional activities. Additional Long Term Goals?: Yes Long Term Goal 6: Patient will dress himself and bathe himself independently. Long Term Goal 7: Patient will improve to Brunnstrom stage V-VI in his left upper extremity in order to use his left  arm as an active assist with all daily activiites.   Problem List Patient Active Problem List  Diagnosis  . Acute pancreatitis  . Cholelithiasis  . Hyperglycemia  .  Current every day smoker  . Atrophy of right kidney  . Kidney calculi  . Hemiplegia, unspecified, affecting nondominant side  . Stented coronary artery  . Stroke, acute, embolic, right  . Seizure  . Cytotoxic cerebral edema  . Aortic thromboembolism  . Muscle weakness (generalized)  . Difficulty in walking  . Lack of coordination  . Left-sided neglect    End of Session Activity Tolerance: Patient tolerated treatment well General Behavior During Session: [facility] for tasks performed Cognition: [facility] for tasks performed  GO   Andrew Blasius L. Kalisi Bevill, COTA/L 07/29/2012, 3:31 PM

## 2012-08-01 ENCOUNTER — Ambulatory Visit (HOSPITAL_COMMUNITY)
Admission: RE | Admit: 2012-08-01 | Discharge: 2012-08-01 | Disposition: A | Discharge status: Home / Self Care | Payer: Medicare Other | Source: Ambulatory Visit | Attending: Physical Medicine & Rehabilitation | Admitting: Physical Medicine & Rehabilitation

## 2012-08-01 ENCOUNTER — Ambulatory Visit (HOSPITAL_BASED_OUTPATIENT_CLINIC_OR_DEPARTMENT_OTHER): Payer: Medicare Other | Admitting: Physical Medicine & Rehabilitation

## 2012-08-01 ENCOUNTER — Encounter: Payer: Self-pay | Admitting: Physical Medicine & Rehabilitation

## 2012-08-01 ENCOUNTER — Encounter: Payer: Medicare Other | Attending: Physical Medicine & Rehabilitation

## 2012-08-01 VITALS — BP 164/90 | HR 71 | Resp 14 | Ht 73.0 in | Wt 249.8 lb

## 2012-08-01 DIAGNOSIS — R279 Unspecified lack of coordination: Secondary | ICD-10-CM

## 2012-08-01 DIAGNOSIS — I69919 Unspecified symptoms and signs involving cognitive functions following unspecified cerebrovascular disease: Secondary | ICD-10-CM | POA: Insufficient documentation

## 2012-08-01 DIAGNOSIS — G8110 Spastic hemiplegia affecting unspecified side: Secondary | ICD-10-CM

## 2012-08-01 DIAGNOSIS — R414 Neurologic neglect syndrome: Secondary | ICD-10-CM

## 2012-08-01 DIAGNOSIS — R339 Retention of urine, unspecified: Secondary | ICD-10-CM | POA: Insufficient documentation

## 2012-08-01 DIAGNOSIS — I69959 Hemiplegia and hemiparesis following unspecified cerebrovascular disease affecting unspecified side: Secondary | ICD-10-CM | POA: Insufficient documentation

## 2012-08-01 DIAGNOSIS — G4730 Sleep apnea, unspecified: Secondary | ICD-10-CM | POA: Insufficient documentation

## 2012-08-01 DIAGNOSIS — I2510 Atherosclerotic heart disease of native coronary artery without angina pectoris: Secondary | ICD-10-CM | POA: Insufficient documentation

## 2012-08-01 MED ORDER — TIZANIDINE HCL 4 MG PO TABS: 2.0000 mg | ORAL_TABLET | Freq: Three times a day (TID) | ORAL | Status: DC | PRN

## 2012-08-01 NOTE — Patient Instructions (Signed)
We will do Botox injections next visit assuming that the Zanaflex does not relieve your spasticity completely

## 2012-08-01 NOTE — Progress Notes (Signed)
Occupational Therapy Treatment Patient Details  Name: Shaun Pugh MRN: [id number] Date of Birth: 20-Apr-1951  Today's Date: 08/01/2012 Time: 4098-1191 OT Time Calculation (min): 52 min NM re-ed 4782-9562 42' E-stim 1515-1525 10'  Visit#: 26 of 36  Re-eval: 08/18/12    Authorization: Medicare Blue   Authorization Time Period: before 40th visit  Authorization Visit#: 26 of 40  Subjective Symptoms/Limitations Symptoms: S: I fell off my chair this weekend reaching for a coax cable that was on the floor.  Pain Assessment Currently in Pain?: No/denies  Precautions/Restrictions  Precautions Precautions: Fall  Exercise/Treatments Neurological Re-education Exercises Shoulder Flexion: AAROM;10 reps;Supine Shoulder Protraction: AAROM;10 reps;Supine Shoulder Horizontal ABduction: AAROM;10 reps;Supine Shoulder External Rotation: 10 reps;Supine;PROM Shoulder Internal Rotation: PROM;10 reps;Supine Elbow Extension: AROM;10 reps;Supine Forearm Supination: AAROM;10 reps;Supine Forearm Pronation: AAROM;10 reps;Supine  Fine Motor Coordination Large Pegboard: 10 pegs placed in board with min vc's to depress shoulder. Performed with E-Stim to faciliate wrist extension.     Modalities Modalities: Copywriter, advertising Location: [name] trapezius Electrical Stimulation Action: To decrease pain Electrical Stimulation Parameters: Pre[place]. Cycle: 10/30 10 min 27mV Electrical Stimulation Goals: Pain  Occupational Therapy Assessment and Plan OT Assessment and Plan Clinical Impression Statement: A: [name] was stiff today and increased ROM with exercises in supine. E-stim applied to lt shoulder due to pain and stiffness. Increased functional use of Lt gross motor coordination this date during pegboard activity. OT Plan: P:  Work on UB dressing. Perform E-stim as needed.   Goals Short Term Goals Time to Complete Short Term Goals: 6  weeks Short Term Goal 1: Patient will be educated on HEP. Short Term Goal 2: Patient will increase AROM in his left shoulder to 75% for increased ablility to reach for objects when completing functional activiites.  Short Term Goal 2 Progress: Progressing toward goal Short Term Goal 3: Patient will increase left grip strength by 10 pounds and pinch strength by 5 pounds for increased ability to open containers when working on his cars. Short Term Goal 4: Patient will complete Nine Hole Peg Test in standardized fashion. Short Term Goal 5: Patient will attend to his left side 75% of the time when completing functional activities. Additional Short Term Goals?: Yes Short Term Goal 6: Patient will dress himself and bathe himself with min pa. Short Term Goal 6 Progress: Progressing toward goal Short Term Goal 7: Patient will decrease edema in left hand to minimal as he increases his functional use of his left hand.  Short Term Goal 8: Patient will use his left hand as a gross assist Ily with all daily activities. Short Term Goal 8 Progress: Progressing toward goal Short Term Goal 9: Patient will be able to reach forward and grasp his toothbrush with his left hand with minimal facilitation at his left shoulder.  Long Term Goals Time to Complete Long Term Goals: 12 weeks Long Term Goal 1: Patient will return to [facility] host activities and be able to feed himself a biscuit using his left hand. Long Term Goal 1 Progress: Progressing toward goal Long Term Goal 2: Patient will increase AROM in his left shoulder to 90% for increased ablility to reach for objects when completing functional activiites.  Long Term Goal 2 Progress: Progressing toward goal Long Term Goal 3: Patient will increase left grip strength by 40 pounds and pich strength by 10  pounds for increased ability to open containers when working on his cars. Long Term Goal 3 Progress: Progressing toward goal  Long Term Goal 4: Patient will  improve fine motor coordination in order to complete Nine Hole Peg Test in 42' or less. Long Term Goal 4 Progress: Progressing toward goal Long Term Goal 5: Patient will attend to his left side 95% of the time when completing functional activities. Additional Long Term Goals?: Yes Long Term Goal 6: Patient will dress himself and bathe himself independently. Long Term Goal 6 Progress: Progressing toward goal Long Term Goal 7: Patient will improve to Brunnstrom stage V-VI in his left upper extremity in order to use his left arm as an active assist with all daily activiites.  Long Term Goal 7 Progress: Progressing toward goal  Problem List Patient Active Problem List  Diagnosis  . Acute pancreatitis  . Cholelithiasis  . Hyperglycemia  . Current every day smoker  . Atrophy of right kidney  . Kidney calculi  . Hemiplegia, unspecified, affecting nondominant side  . Stented coronary artery  . Stroke, acute, embolic, right  . Seizure  . Cytotoxic cerebral edema  . Aortic thromboembolism  . Muscle weakness (generalized)  . Difficulty in walking  . Lack of coordination  . Left-sided neglect    End of Session Activity Tolerance: Patient tolerated treatment well General Behavior During Session: [facility] for tasks performed Cognition: [facility] for tasks performed   Limmie Patricia, OTR/L  08/01/2012, 4:50 PM

## 2012-08-01 NOTE — Progress Notes (Signed)
Subjective:    Patient ID: Shaun Pugh, male    DOB: [birth year], 62 y.o.   MRN: [id number]  HPI 62 year old male with  history of coronary artery disease and TIA with left visual field  deficits who was admitted March 17, 2012, with headache, left-sided  weakness, numbness, and slurred speech. He was treated with tPA. CTA  head and neck showed hypodensity at right superior gyrus, ulcerated  plaque at right ICA with 60% ICA stenosis and suspicion of 10-mm focal  thrombus proximal aortic arch. The patient developed combativeness with  agitation as well as focal motor seizures of left upper extremity on  March 18, 2012. He was treated with Ativan and was started on Keppra  for treatment. Followup CT showed extensive area of low density  affecting cortical and subcortical right hemisphere in the MCA  distribution. 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 General activity 0 Relation with others 0 Enjoyment of life 0 What TIME of day is your pain at its worst? no pain Sleep (in general) Good  Pain is worse with: no pain Pain improves with: no pain Relief from Meds: no pain medication  Mobility use a cane  Function I need assistance with the following:  dressing, bathing and household duties  Neuro/Psych weakness numbness tremor  Prior Studies Any changes since last visit?  no  Physicians involved in your care Any changes since last visit?  no   Family History  Problem Relation Age of Onset  . Anesthesia problems Neg Hx   . Hypotension Neg Hx   . Malignant hyperthermia Neg Hx   . Pseudochol deficiency Neg Hx   . Congestive Heart Failure Mother   . Diabetes Father   . Heart attack Father    History   Social History  . Marital Status: Married    Spouse Name: N/A   Number of Children: N/A  . Years of Education: N/A   Social History Main Topics  . Smoking status: Former Smoker -- 1.00 packs/day for 45 years    Types: Cigarettes    Quit date: 03/18/2012  . Smokeless tobacco: Never Used  . Alcohol Use: No  . Drug Use: No  . Sexually Active: Yes    Birth Control/ Protection: None   Other Topics Concern  . None   Social History Narrative  . None   Past Surgical History  Procedure Laterality Date  . Back surgery      x 4   . Cholecystectomy  09/25/2011    Procedure: LAPAROSCOPIC CHOLECYSTECTOMY;  Surgeon: Fabio Bering, MD;  Location: [facility];  Service: General;  Laterality: N/A;   Past Medical History  Diagnosis Date  . High cholesterol   . Atrophy of right kidney 09/15/2011  . Kidney calculi 09/15/2011  . Acute pancreatitis 09/14/2011  . Cholelithiasis 09/14/2011  . Chronic back pain   . Sleep apnea     STOP BANG 5  . TIA (transient ischemic attack)   . CAD (coronary artery disease)   . Stented coronary artery approx 2004  . Stroke    BP 164/90  Pulse 71  Resp 14  Ht 6\' 1"  (1.854 m)  Wt 249 lb 12.8 oz (113.309 kg)  BMI 32.96 kg/m2  SpO2 97%    Review of Systems  Neurological: Positive for tremors, weakness and numbness.  All other systems reviewed and are negative.       Objective:   Physical Exam  Nursing note and vitals reviewed.  Constitutional: He is oriented to person, place, and time. He appears well-developed.  Obese  HENT:  Head: Normocephalic and atraumatic.  Eyes: Conjunctivae and EOM are normal. Pupils are equal, round, and reactive to light.  Neck: Normal range of motion.  Musculoskeletal:  Left shoulder: He exhibits decreased range of motion, pain and spasm.  Increased pectoralis tone  Neurological: He is alert and oriented to person, place, and time. He has normal reflexes. He displays no atrophy. No cranial nerve deficit or sensory deficit. He exhibits abnormal muscle tone. Coordination and gait  abnormal.  Mild left neglect Decreased fine motor left upper extremity Pain with range of motion left wrist and left shoulder and left thumb  Swelling of dorsum left hand No hypersensitivity to touch  Skin: Skin is warm and dry.  Psychiatric: He has a normal mood and affect.   Decreased passive external rotation of the left upper extremity. Also problems with resisted supination with increased  Left pronator tone     Assessment & Plan:  1. Left spastic hemiplegia with increased tone in the pectoralis as well as subscapularis muscles. In addition he has tightness in the pronator teres muscle. Plan Will do ultrasound guided left pronator teres injection along with pectoralis injection along with left subscapularis injection,200 units total

## 2012-08-03 ENCOUNTER — Ambulatory Visit (HOSPITAL_COMMUNITY): Payer: Medicare Other | Admitting: Occupational Therapy

## 2012-08-05 ENCOUNTER — Ambulatory Visit (HOSPITAL_COMMUNITY)
Admission: RE | Admit: 2012-08-05 | Discharge: 2012-08-05 | Disposition: A | Discharge status: Home / Self Care | Payer: Medicare Other | Source: Ambulatory Visit | Attending: Physical Medicine & Rehabilitation | Admitting: Physical Medicine & Rehabilitation

## 2012-08-05 DIAGNOSIS — R279 Unspecified lack of coordination: Secondary | ICD-10-CM

## 2012-08-05 DIAGNOSIS — R414 Neurologic neglect syndrome: Secondary | ICD-10-CM

## 2012-08-05 NOTE — Progress Notes (Signed)
Occupational Therapy Treatment Patient Details  Name: Shaun Pugh MRN: [id number] Date of Birth: 06/08/1950  Today's Date: 08/05/2012 Time: 1305-1350 OT Time Calculation (min): 45 min NM re-ed 4098-1191 30' E-stim 1335-1350 15'  Visit#: 27 of 36  Re-eval: 08/18/12    Authorization: Medicare Blue   Authorization Time Period: before 40th visit  Authorization Visit#: 27 of 40  Subjective Symptoms/Limitations Symptoms: S: My thumb still hurts when I move it. Pain Assessment Currently in Pain?: No/denies  Precautions/Restrictions  Precautions Precautions: Fall  Exercise/Treatments Neurological Re-education  Development of Reach  Development of Reach: Saebo Reaching to Waist: Seated edge of mat, grasped ball held in various planes waist height and below and placed in crate on left side of mat. Once all balls places in crate, pt reached for each ball with left hand and handed to right hand to place in empty crate on right side. Mod difficulty. vc's for technique.  Grasp and Release Thumb Opposition: [place] Manipulative Rate Test (MMRT) Board used to faciliate thumb opposition and in hand manipulation to flip discs from red to black. performed during ES         Modalities Modalities: Copywriter, advertising Location: Lt. wrist extensors Electrical Stimulation Action: To increase wrist extension. Electrical Stimulation Parameters: Russion. Cycle: 10/30 15 min 40mV  Occupational Therapy Assessment and Plan OT Assessment and Plan Clinical Impression Statement: A: increased functional reach at waist height this date. pt still compensating by abducting shoulder when reaching. Education needed to adduct.  OT Plan: P:  Work on UB dressing.   Goals Short Term Goals Time to Complete Short Term Goals: 6 weeks Short Term Goal 1: Patient will be educated on HEP. Short Term Goal 2: Patient will increase AROM in his left shoulder to  75% for increased ablility to reach for objects when completing functional activiites.  Short Term Goal 3: Patient will increase left grip strength by 10 pounds and pinch strength by 5 pounds for increased ability to open containers when working on his cars. Short Term Goal 4: Patient will complete Nine Hole Peg Test in standardized fashion. Short Term Goal 5: Patient will attend to his left side 75% of the time when completing functional activities. Additional Short Term Goals?: Yes Short Term Goal 6: Patient will dress himself and bathe himself with min pa. Short Term Goal 7: Patient will decrease edema in left hand to minimal as he increases his functional use of his left hand.  Short Term Goal 8: Patient will use his left hand as a gross assist Ily with all daily activities. Short Term Goal 9: Patient will be able to reach forward and grasp his toothbrush with his left hand with minimal facilitation at his left shoulder.  Long Term Goals Time to Complete Long Term Goals: 12 weeks Long Term Goal 1: Patient will return to [facility] host activities and be able to feed himself a biscuit using his left hand. Long Term Goal 2: Patient will increase AROM in his left shoulder to 90% for increased ablility to reach for objects when completing functional activiites.  Long Term Goal 3: Patient will increase left grip strength by 40 pounds and pich strength by 10  pounds for increased ability to open containers when working on his cars. Long Term Goal 4: Patient will improve fine motor coordination in order to complete Nine Hole Peg Test in 55' or less. Long Term Goal 5: Patient will attend to his left side 95% of the time  when completing functional activities. Additional Long Term Goals?: Yes Long Term Goal 6: Patient will dress himself and bathe himself independently. Long Term Goal 7: Patient will improve to Brunnstrom stage V-VI in his left upper extremity in order to use his left arm as an active  assist with all daily activiites.   Problem List Patient Active Problem List  Diagnosis  . Acute pancreatitis  . Cholelithiasis  . Hyperglycemia  . Current every day smoker  . Atrophy of right kidney  . Kidney calculi  . Hemiplegia, unspecified, affecting nondominant side  . Stented coronary artery  . Stroke, acute, embolic, right  . Seizure  . Cytotoxic cerebral edema  . Aortic thromboembolism  . Muscle weakness (generalized)  . Difficulty in walking  . Lack of coordination  . Left-sided neglect    End of Session Activity Tolerance: Patient tolerated treatment well General Behavior During Session: [facility] for tasks performed Cognition: [facility] for tasks performed   Limmie Patricia, OTR/L  08/05/2012, 2:11 PM

## 2012-08-08 ENCOUNTER — Ambulatory Visit (HOSPITAL_COMMUNITY)
Admission: RE | Admit: 2012-08-08 | Discharge: 2012-08-08 | Disposition: A | Discharge status: Home / Self Care | Payer: Medicare Other | Source: Ambulatory Visit | Attending: Physical Medicine & Rehabilitation | Admitting: Physical Medicine & Rehabilitation

## 2012-08-08 DIAGNOSIS — R279 Unspecified lack of coordination: Secondary | ICD-10-CM

## 2012-08-08 DIAGNOSIS — R414 Neurologic neglect syndrome: Secondary | ICD-10-CM

## 2012-08-08 NOTE — Progress Notes (Signed)
Occupational Therapy Treatment Patient Details  Name: Shaun Pugh MRN: [id number] Date of Birth: 14-Jun-1950  Today's Date: 08/08/2012 Time: 4098-1191 OT Time Calculation (min): 40 min Neuro re-ed 40'  Visit#: 28 of 36  Re-eval: 08/18/12    Authorization: Medicare Blue   Authorization Time Period: before 40th visit  Authorization Visit#: 28 of 40  Subjective Symptoms/Limitations Symptoms: S:  I tried to work on Contractor. Pain Assessment Currently in Pain?: No/denies  Precautions/Restrictions     Exercise/Treatments    Seated Extension: Theraband;10 reps Theraband Level (Shoulder Extension): Level 3 (Green) External Rotation: Theraband;10 reps Theraband Level (Shoulder External Rotation): Level 3 (Green) Flexion: Theraband;10 reps Theraband Level (Shoulder Flexion): Level 3 (Green) Abduction: Theraband;10 reps Theraband Level (Shoulder ABduction): Level 3 (Green) Other Seated Exercises: nu-step using UE only   Hand Exercises Theraputty - Flatten: pink Theraputty - Roll: pink Theraputty - Grip: pink Theraputty - Pinch: pink Fine Motor Coordination: Thumb opposition (manuvering spinges and playing connect four)  Neurological Re-education Exercises    Weight Bearing Exercises Weight Bearing Position: Standing Standing with weight shifting on and off: while flattening putty  Development of Reach  Development of Reach: Reaching (playing connect four and putting sponges into container.)  Grasp and Release Theraputty - Flatten: pink Theraputty - Roll: pink Theraputty - Grip: pink Theraputty - Pinch: pink  Fine Motor Coordination Fine Motor Coordination: Thumb opposition (manuvering spinges and playing connect four)        Occupational Therapy Assessment and Plan OT Assessment and Plan Clinical Impression Statement: A:  Cues to lift hand up and over when playing connect four or with sponges vs letting hand drag back to table. OT Plan: P:  Work on UB  dressing.   Goals Short Term Goals Time to Complete Short Term Goals: 6 weeks Short Term Goal 1: Patient will be educated on HEP. Short Term Goal 2: Patient will increase AROM in his left shoulder to 75% for increased ablility to reach for objects when completing functional activiites.  Short Term Goal 3: Patient will increase left grip strength by 10 pounds and pinch strength by 5 pounds for increased ability to open containers when working on his cars. Short Term Goal 4: Patient will complete Nine Hole Peg Test in standardized fashion. Short Term Goal 5: Patient will attend to his left side 75% of the time when completing functional activities. Additional Short Term Goals?: Yes Short Term Goal 6: Patient will dress himself and bathe himself with min pa. Short Term Goal 7: Patient will decrease edema in left hand to minimal as he increases his functional use of his left hand.  Short Term Goal 8: Patient will use his left hand as a gross assist Ily with all daily activities. Short Term Goal 9: Patient will be able to reach forward and grasp his toothbrush with his left hand with minimal facilitation at his left shoulder.  Long Term Goals Time to Complete Long Term Goals: 12 weeks Long Term Goal 1: Patient will return to [facility] host activities and be able to feed himself a biscuit using his left hand. Long Term Goal 2: Patient will increase AROM in his left shoulder to 90% for increased ablility to reach for objects when completing functional activiites.  Long Term Goal 3: Patient will increase left grip strength by 40 pounds and pich strength by 10  pounds for increased ability to open containers when working on his cars. Long Term Goal 4: Patient will improve fine motor coordination  in order to complete Nine Hole Peg Test in 45' or less. Long Term Goal 5: Patient will attend to his left side 95% of the time when completing functional activities. Additional Long Term Goals?: Yes Long Term  Goal 6: Patient will dress himself and bathe himself independently. Long Term Goal 7: Patient will improve to Brunnstrom stage V-VI in his left upper extremity in order to use his left arm as an active assist with all daily activiites.   Problem List Patient Active Problem List  Diagnosis  . Acute pancreatitis  . Cholelithiasis  . Hyperglycemia  . Current every day smoker  . Atrophy of right kidney  . Kidney calculi  . Hemiplegia, unspecified, affecting nondominant side  . Stented coronary artery  . Stroke, acute, embolic, right  . Seizure  . Cytotoxic cerebral edema  . Aortic thromboembolism  . Muscle weakness (generalized)  . Difficulty in walking  . Lack of coordination  . Left-sided neglect    End of Session Activity Tolerance: Patient tolerated treatment well General Behavior During Session: [facility] for tasks performed Cognition: [facility] for tasks performed  GO   Mariyam Remington L. Noralee Stain, COTA/L 08/08/2012, 4:29 PM

## 2012-08-10 ENCOUNTER — Ambulatory Visit (HOSPITAL_COMMUNITY)
Admission: RE | Admit: 2012-08-10 | Discharge: 2012-08-10 | Disposition: A | Discharge status: Home / Self Care | Payer: Medicare Other | Source: Ambulatory Visit | Attending: Physical Medicine & Rehabilitation | Admitting: Physical Medicine & Rehabilitation

## 2012-08-10 DIAGNOSIS — R279 Unspecified lack of coordination: Secondary | ICD-10-CM | POA: Insufficient documentation

## 2012-08-10 DIAGNOSIS — M6281 Muscle weakness (generalized): Secondary | ICD-10-CM | POA: Insufficient documentation

## 2012-08-10 DIAGNOSIS — R414 Neurologic neglect syndrome: Secondary | ICD-10-CM

## 2012-08-10 DIAGNOSIS — R262 Difficulty in walking, not elsewhere classified: Secondary | ICD-10-CM | POA: Insufficient documentation

## 2012-08-10 DIAGNOSIS — IMO0001 Reserved for inherently not codable concepts without codable children: Secondary | ICD-10-CM | POA: Insufficient documentation

## 2012-08-10 NOTE — Progress Notes (Signed)
Occupational Therapy Treatment Patient Details  Name: Shaun Pugh MRN: [id number] Date of Birth: December 06, 1950  Today's Date: 08/10/2012 Time: 4098-1191 OT Time Calculation (min): 46 min Neuro re-ed 46'  Visit#: 29 of 36  Re-eval: 08/18/12    Authorization: Medicare Blue   Authorization Time Period: before 40th visit  Authorization Visit#: 29 of 40  Subjective Symptoms/Limitations Symptoms: S:  I worked on my golf cart and got it running.  Precautions/Restrictions     Exercise/Treatments  Hand Exercises MCPJ Extension: PROM;Self ROM;5 reps PIPJ Extension: PROM;Self ROM;5 reps DIPJ Extension: PROM;Self ROM;5 reps Theraputty - Flatten: pink Theraputty - Roll: pink Theraputty - Grip: pink Theraputty - Pinch: pink Fine Motor Coordination: In hand manipuation training (using dominos blocks)  Neurological Re-education Development of Reach  Development of Reach: Reaching (with sponges into waist heigh container)  Grasp and Release Theraputty - Flatten: pink Theraputty - Roll: pink Theraputty - Grip: pink Theraputty - Pinch: pink  Fine Motor Coordination Fine Motor Coordination: In hand manipuation training (using dominos blocks)      Occupational Therapy Assessment and Plan OT Assessment and Plan Clinical Impression Statement: A:  Cues to keep shoulder depressed with active reaching task and to stretch his wrist and digits when he begins to shake to help decrease his tone. OT Plan: P:  Continue to educate on techniques to decrease clonus with activity.   Goals Short Term Goals Time to Complete Short Term Goals: 6 weeks Short Term Goal 1: Patient will be educated on HEP. Short Term Goal 2: Patient will increase AROM in his left shoulder to 75% for increased ablility to reach for objects when completing functional activiites.  Short Term Goal 3: Patient will increase left grip strength by 10 pounds and pinch strength by 5 pounds for increased ability to open containers  when working on his cars. Short Term Goal 4: Patient will complete Nine Hole Peg Test in standardized fashion. Short Term Goal 5: Patient will attend to his left side 75% of the time when completing functional activities. Additional Short Term Goals?: Yes Short Term Goal 6: Patient will dress himself and bathe himself with min pa. Short Term Goal 7: Patient will decrease edema in left hand to minimal as he increases his functional use of his left hand.  Short Term Goal 8: Patient will use his left hand as a gross assist Ily with all daily activities. Short Term Goal 9: Patient will be able to reach forward and grasp his toothbrush with his left hand with minimal facilitation at his left shoulder.  Long Term Goals Time to Complete Long Term Goals: 12 weeks Long Term Goal 1: Patient will return to [facility] host activities and be able to feed himself a biscuit using his left hand. Long Term Goal 2: Patient will increase AROM in his left shoulder to 90% for increased ablility to reach for objects when completing functional activiites.  Long Term Goal 3: Patient will increase left grip strength by 40 pounds and pich strength by 10  pounds for increased ability to open containers when working on his cars. Long Term Goal 4: Patient will improve fine motor coordination in order to complete Nine Hole Peg Test in 61' or less. Long Term Goal 5: Patient will attend to his left side 95% of the time when completing functional activities. Additional Long Term Goals?: Yes Long Term Goal 6: Patient will dress himself and bathe himself independently. Long Term Goal 7: Patient will improve to Brunnstrom stage V-VI in  his left upper extremity in order to use his left arm as an active assist with all daily activiites.   Problem List Patient Active Problem List  Diagnosis  . Acute pancreatitis  . Cholelithiasis  . Hyperglycemia  . Current every day smoker  . Atrophy of right kidney  . Kidney calculi  .  Hemiplegia, unspecified, affecting nondominant side  . Stented coronary artery  . Stroke, acute, embolic, right  . Seizure  . Cytotoxic cerebral edema  . Aortic thromboembolism  . Muscle weakness (generalized)  . Difficulty in walking  . Lack of coordination  . Left-sided neglect    End of Session Activity Tolerance: Patient tolerated treatment well General Behavior During Session: [facility] for tasks performed Cognition: [facility] for tasks performed  GO   Shaun Pugh L. Breven Guidroz, COTA/L 08/10/2012, 4:41 PM

## 2012-08-12 ENCOUNTER — Ambulatory Visit (HOSPITAL_COMMUNITY)
Admission: RE | Admit: 2012-08-12 | Discharge: 2012-08-12 | Disposition: A | Discharge status: Home / Self Care | Payer: Medicare Other | Source: Ambulatory Visit | Attending: Physical Medicine & Rehabilitation | Admitting: Physical Medicine & Rehabilitation

## 2012-08-12 DIAGNOSIS — R279 Unspecified lack of coordination: Secondary | ICD-10-CM

## 2012-08-12 DIAGNOSIS — R414 Neurologic neglect syndrome: Secondary | ICD-10-CM

## 2012-08-12 NOTE — Progress Notes (Signed)
Occupational Therapy Treatment Patient Details  Name: Shaun Pugh MRN: [id number] Date of Birth: [birth year]  Today's Date: 08/12/2012 Time: 2536-6440 OT Time Calculation (min): 47 min NM re-ed 3474-2595 47'  Visit#: 30 of 36  Re-eval: 08/18/12    Authorization: Medicare Blue   Authorization Time Period: before 40th visit  Authorization Visit#: 30 of 40  Subjective Symptoms/Limitations Symptoms: S: At night the left side of my neck hurts. Pain Assessment Currently in Pain?: No/denies  Precautions/Restrictions  Precautions Precautions: Fall  Exercise/Treatments Neurological Re-education Development of Reach  Development of Reach: Reaching (reaching with sponges in waist height container; 5 at a time)  Grasp and Release Grasp and Release:  (10 clothespins places on waist height container's edge.)  Fine Motor Coordination In Hand Manipulation Training: [employer]. pt retrieved glass stones from each chup with right hand and places individually into left hand to deposit into each cup on board. Max difficulty         Occupational Therapy Assessment and Plan OT Assessment and Plan Clinical Impression Statement: A: Cues to depress shoulder during active reaching tasks. Difficulty with handling glass stones due to texture. Only minimal amount of shaking noted when LUE became fatigued. OT Plan: P:  Continue to educate on techniques to decrease clonus with activity. MFR to Lt cervical region to decrease tightness and pain.   Goals Short Term Goals Time to Complete Short Term Goals: 6 weeks Short Term Goal 1: Patient will be educated on HEP. Short Term Goal 2: Patient will increase AROM in his left shoulder to 75% for increased ablility to reach for objects when completing functional activiites.  Short Term Goal 2 Progress: Progressing toward goal Short Term Goal 3: Patient will increase left grip strength by 10 pounds and pinch strength by 5 pounds for increased ability  to open containers when working on his cars. Short Term Goal 4: Patient will complete Nine Hole Peg Test in standardized fashion. Short Term Goal 5: Patient will attend to his left side 75% of the time when completing functional activities. Additional Short Term Goals?: Yes Short Term Goal 6: Patient will dress himself and bathe himself with min pa. Short Term Goal 6 Progress: Progressing toward goal Short Term Goal 7: Patient will decrease edema in left hand to minimal as he increases his functional use of his left hand.  Short Term Goal 8: Patient will use his left hand as a gross assist Ily with all daily activities. Short Term Goal 8 Progress: Progressing toward goal Short Term Goal 9: Patient will be able to reach forward and grasp his toothbrush with his left hand with minimal facilitation at his left shoulder.  Long Term Goals Time to Complete Long Term Goals: 12 weeks Long Term Goal 1: Patient will return to [facility] host activities and be able to feed himself a biscuit using his left hand. Long Term Goal 1 Progress: Progressing toward goal Long Term Goal 2: Patient will increase AROM in his left shoulder to 90% for increased ablility to reach for objects when completing functional activiites.  Long Term Goal 2 Progress: Progressing toward goal Long Term Goal 3: Patient will increase left grip strength by 40 pounds and pich strength by 10  pounds for increased ability to open containers when working on his cars. Long Term Goal 3 Progress: Progressing toward goal Long Term Goal 4: Patient will improve fine motor coordination in order to complete Nine Hole Peg Test in 45' or less. Long Term Goal 4  Progress: Progressing toward goal Long Term Goal 5: Patient will attend to his left side 95% of the time when completing functional activities. Additional Long Term Goals?: Yes Long Term Goal 6: Patient will dress himself and bathe himself independently. Long Term Goal 6 Progress: Progressing  toward goal Long Term Goal 7: Patient will improve to Brunnstrom stage V-VI in his left upper extremity in order to use his left arm as an active assist with all daily activiites.  Long Term Goal 7 Progress: Progressing toward goal  Problem List Patient Active Problem List  Diagnosis  . Acute pancreatitis  . Cholelithiasis  . Hyperglycemia  . Current every day smoker  . Atrophy of right kidney  . Kidney calculi  . Hemiplegia, unspecified, affecting nondominant side  . Stented coronary artery  . Stroke, acute, embolic, right  . Seizure  . Cytotoxic cerebral edema  . Aortic thromboembolism  . Muscle weakness (generalized)  . Difficulty in walking  . Lack of coordination  . Left-sided neglect    End of Session Activity Tolerance: Patient tolerated treatment well General Behavior During Session: [facility] for tasks performed Cognition: [facility] for tasks performed   Limmie Patricia, OTR/L,CBIS   08/12/2012, 3:34 PM

## 2012-08-15 ENCOUNTER — Ambulatory Visit (HOSPITAL_COMMUNITY)
Admission: RE | Admit: 2012-08-15 | Discharge: 2012-08-15 | Disposition: A | Discharge status: Home / Self Care | Payer: Medicare Other | Source: Ambulatory Visit | Attending: Physical Medicine & Rehabilitation | Admitting: Physical Medicine & Rehabilitation

## 2012-08-15 DIAGNOSIS — R279 Unspecified lack of coordination: Secondary | ICD-10-CM

## 2012-08-15 DIAGNOSIS — R414 Neurologic neglect syndrome: Secondary | ICD-10-CM
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
## 2012-08-17 ENCOUNTER — Ambulatory Visit (HOSPITAL_COMMUNITY): Payer: Medicare Other

## 2012-08-19 ENCOUNTER — Ambulatory Visit (HOSPITAL_COMMUNITY)
Admission: RE | Admit: 2012-08-19 | Discharge: 2012-08-19 | Disposition: A | Discharge status: Home / Self Care | Payer: Medicare Other | Source: Ambulatory Visit | Attending: Physical Medicine & Rehabilitation | Admitting: Physical Medicine & Rehabilitation

## 2012-08-19 DIAGNOSIS — R414 Neurologic neglect syndrome: Secondary | ICD-10-CM

## 2012-08-19 DIAGNOSIS — R279 Unspecified lack of coordination: Secondary | ICD-10-CM

## 2012-08-19 NOTE — Evaluation (Signed)
Occupational Therapy Reassessment   Patient Details  Name: Shaun Pugh MRN: [id number] Date of Birth: 1950-11-01  Today's Date: 08/19/2012 Time: 4098-1191 OT Time Calculation (min): 50 min Reassessment 40' HEP instruction 10' Visit#: 32 of 36  Re-eval: 09/16/12     Authorization: Medicare Blue   Authorization Time Period: before 42 visit  Authorization Visit#: 32 of 42   Past Medical History:  Past Medical History  Diagnosis Date  . High cholesterol   . Atrophy of right kidney 09/15/2011  . Kidney calculi 09/15/2011  . Acute pancreatitis 09/14/2011  . Cholelithiasis 09/14/2011  . Chronic back pain   . Sleep apnea     STOP BANG 5  . TIA (transient ischemic attack)   . CAD (coronary artery disease)   . Stented coronary artery approx 2004  . Stroke    Past Surgical History:  Past Surgical History  Procedure Laterality Date  . Back surgery      x 4   . Cholecystectomy  09/25/2011    Procedure: LAPAROSCOPIC CHOLECYSTECTOMY;  Surgeon: Fabio Bering, MD;  Location: [facility];  Service: General;  Laterality: N/A;    Subjective S:  I have alot of tightness in my thumb and the butterflys in my hand still.  It is easier for me to put my socks and shoes on and to fasten the buttons on my pants Pain Assessment Currently in Pain?: Yes Pain Score:   4 Pain Location:  (thumb - left)  Assessment CoordinationCoordination 9 Hole Peg Test: 9' 51.9" (12'46")  Additional Assessments LUE AROM (degrees) LUE Overall AROM Comments: assessed in seated Left Shoulder Flexion: 75 Degrees ((35)) Left Shoulder ABduction: 75 Degrees ((81 in supine)) Left Shoulder Internal Rotation: 80 Degrees Left Shoulder External Rotation: 0 Degrees (can externally rotate from full internal rotation to neutral) Left Elbow Flexion: 119 ((74)) Left Elbow Extension:  (lacks 14 degrees from neutral (0).) Left Forearm Pronation: 90 Degrees Left Forearm Supination: 20 Degrees Left Wrist Extension: 50 Degrees  ((30)) Left Wrist Flexion: 38 Degrees ((48)) LUE Strength LUE Overall Strength Comments: assessed in seated Left Shoulder Flexion: 2/5 ((2-/5)) Left Shoulder ABduction: 2/5 ((2-/5)) Left Shoulder Internal Rotation: 2/5 ((2-/5)) Left Shoulder External Rotation: 2/5 (2-/5)) Left Elbow Flexion: 4/5 ((3/5)) Left Elbow Extension: 4/5 Left Forearm Pronation: 4/5 ((3/5)) Left Forearm Supination: 3/5 Left Wrist Flexion: 4/5 Left Wrist Extension: 4/5 Grip (lbs): 23 (920)) Lateral Pinch: 11 lbs ((10)) 3 Point Pinch: 8 lbs ((8)) LUE Tone LUE Tone Comments: Brunnstrom stage IV-V in arm and hand (stage III) Left Hand Strength - Pinch (lbs) Lateral Pinch: 11 lbs ((10)) 3 Point Pinch: 8 lbs ((8))   Occupational Therapy Assessment and Plan OT Assessment and Plan Clinical Impression Statement: A:  Patient presented with greater flexor synergy pattern and less attention to his left today for unexplain reasons, I educated patient to insure he is getting enough sleep to avoid  fatigue.  Patient reassessed this date, and is making excellent progress towards his goals.  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  He continues to have difficulty with reaching out in front of his body to complete activities such as washing his truck.  He also has difficulty using hand tools due to tremors in his hand.   OT Frequency: Min 3X/week OT Duration: 4 weeks OT Plan: P:  Continue skilled OT  intervention 3 x week to improve his proximal shoulder stability needed for greater independence with activities involving reaching forward or to his side, and activities that involve fine motor coordination.  Treatment sessions will be heavy on proximal shoulder strengthening and fine motor coordination/hand tool use.    Goals Short Term Goals Time to Complete Short  Term Goals: 6 weeks Short Term Goal 1: Patient will be educated on HEP. Short Term Goal 2: Patient will increase AROM in his left shoulder to 75% for increased ablility to reach for objects when completing functional activiites.  Short Term Goal 3: Patient will increase left grip strength by 10 pounds and pinch strength by 5 pounds for increased ability to open containers when working on his cars. Short Term Goal 4: Patient will complete Nine Hole Peg Test in standardized fashion. Short Term Goal 5: Patient will attend to his left side 75% of the time when completing functional activities. Additional Short Term Goals?: Yes Short Term Goal 6: Patient will dress himself and bathe himself with min pa. Short Term Goal 7: Patient will decrease edema in left hand to minimal as he increases his functional use of his left hand.  Short Term Goal 8: Patient will use his left hand as a gross assist Ily with all daily activities. Short Term Goal 9: Patient will be able to reach forward and grasp his toothbrush with his left hand with minimal facilitation at his left shoulder.  Long Term Goals Time to Complete Long Term Goals: 12 weeks Long Term Goal 1: Patient will return to [facility] host activities and be able to feed himself a biscuit using his left hand. Long Term Goal 2: Patient will increase AROM in his left shoulder to 90% for increased ablility to reach for objects when completing functional activiites.  Long Term Goal 3: Patient will increase left grip strength by 40 pounds and pich strength by 10  pounds for increased ability to open containers when working on his cars. Long Term Goal 4: Patient will improve fine motor coordination in order to complete Nine Hole Peg Test in 65' or less. Long Term Goal 5: Patient will attend to his left side 95% of the time when completing functional activities. Additional Long Term Goals?: Yes Long Term Goal 6: Patient will dress himself and bathe himself  independently. Long Term Goal 7: Patient will improve to Brunnstrom stage V-VI in his left upper extremity in order to use his left arm as an active assist with all daily activiites.  Long Term Goal 8: Patient will increase coordination by decreasing completion time with Nine Hole Peg Test to less than 5'. Long Term Goal 9: Patient will improve proximal shoulder strength from poor to fair + for increased independence with washing his truck.   Problem List Patient Active Problem List  Diagnosis  . Acute pancreatitis  . Cholelithiasis  . Hyperglycemia  . Current every day smoker  . Atrophy of right kidney  . Kidney calculi  . Hemiplegia, unspecified, affecting nondominant side  . Stented coronary artery  . Stroke, acute, embolic, right  . Seizure  . Cytotoxic cerebral edema  . Aortic thromboembolism  . Muscle weakness (generalized)  . Difficulty in walking  . Lack of coordination  . Left-sided neglect    End of Session Activity Tolerance: Patient tolerated treatment well General Behavior During Session: [facility] for tasks performed Cognition: [facility] for tasks performed OT Plan of Care OT Home Exercise Plan: proximal shoulder strengthening, theraband.   Consulted  and Agree with Plan of Care: Patient;Family member/caregiver Family Member Consulted: wife  GO Functional Assessment Tool Used: clinical observation and assessment of ROM, strength, coordination  Functional Limitation: Self care Self Care Current Status [phone]): At least 20 percent but less than 40 percent impaired, limited or restricted Self Care Goal Status (U0454): At least 1 percent but less than 20 percent impaired, limited or restricted  [name], OTR/L  08/19/2012, 3:24 PM  Physician Documentation Your signature is required to indicate approval of the treatment plan as stated above.  Please sign and either send electronically or make a copy of this report for your files and return this physician signed  original.  Please mark one 1.__approve of plan  2. ___approve of plan with the following conditions.   ______________________________                                                          _____________________ Physician Signature                                                                                                             Date

## 2012-08-22 ENCOUNTER — Ambulatory Visit (HOSPITAL_COMMUNITY)
Admission: RE | Admit: 2012-08-22 | Discharge: 2012-08-22 | Disposition: A | Discharge status: Home / Self Care | Payer: Medicare Other | Source: Ambulatory Visit | Attending: Physical Medicine & Rehabilitation | Admitting: Physical Medicine & Rehabilitation

## 2012-08-22 DIAGNOSIS — R279 Unspecified lack of coordination: Secondary | ICD-10-CM

## 2012-08-22 DIAGNOSIS — R414 Neurologic neglect syndrome: Secondary | ICD-10-CM

## 2012-08-22 NOTE — Progress Notes (Addendum)
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 Pain Assessment Currently in Pain?: Yes Pain Score:   3 Pain Orientation: Left Pain Type: Acute pain Pain Radiating Towards: shoulder, wrist, and hand  Precautions/Restrictions  Precautions Precautions: Fall  Exercise/Treatments Hand Exercises Other Hand Exercises: Pt removed/placed one nut from bolt board with Lt. hand. vc's to not use Rt. hand. Max diff. with increased time. Other Hand Exercises: Pt. used pvc piping tool used with yellow putty to mimic using wrench at home.  Neurological Re-education Exercises Forearm Supination: AROM;5 reps;Seated Forearm Pronation: AROM;5 reps;Seated Wrist Flexion: PROM;10 reps;Seated Wrist Extension: PROM;10 reps;Seated Wrist Radial Deviation: PROM;10 reps;Seated Wrist Ulnar Deviation: PROM;10 reps;Seated   Development of Reach  Development of Reach: Board Board Exercises: Pt completed wall wash with Rt hand assisting Lt hand. Two walls washed successfully.   Occupational Therapy Assessment and Plan OT Assessment and Plan Clinical Impression Statement: A: Pt with slight increase of Lt. hand swelling.. Tolerated wall washing, and pvc piping activity. Pt had increased difficulty with find motor coordination task with nut and bolt board. OT Plan: P: Work on improving proximal shoulder stablity with functional tasks and add fine motor coordination activities as well. Add wall slides with LUE.   Goals Short Term Goals Time to Complete Short Term Goals: 6  weeks Short Term Goal 1: Patient will be educated on HEP. Short Term Goal 2: Patient will increase AROM in his left shoulder to 75% for increased ablility to reach for objects when completing functional activiites.  Short Term Goal 2 Progress: Progressing toward goal Short Term Goal 3: Patient will increase left grip strength by 10 pounds and pinch strength by 5 pounds for increased ability to open containers when working on his cars. Short Term Goal 4: Patient will complete Nine Hole Peg Test in standardized fashion. Short Term Goal 5: Patient will attend to his left side 75% of the time when completing functional activities. Additional Short Term Goals?: Yes Short Term Goal 6: Patient will dress himself and bathe himself with min pa. Short Term Goal 7: Patient will decrease edema in left hand to minimal as he increases his functional use of his left hand.  Short Term Goal 8: Patient will use his left hand as a gross assist Ily with all daily activities. Short Term Goal 8 Progress: Progressing toward goal Short Term Goal 9: Patient will be able to reach forward and grasp his toothbrush with his left hand with minimal facilitation at his left shoulder.  Long Term Goals Time to Complete Long Term Goals: 12 weeks Long Term Goal 1: Patient will return to [facility] host activities and be able to feed himself a biscuit using his left hand. Long Term Goal 1 Progress: Progressing toward goal Long Term Goal 2: Patient will increase AROM in his left shoulder to 90% for increased ablility to reach for objects when completing functional activiites.  Long Term Goal 2 Progress: Progressing toward goal Long Term Goal 3: Patient will increase left grip strength by 40 pounds and pich strength by 10  pounds for increased ability to  open containers when working on his cars. Long Term Goal 3 Progress: Progressing toward goal Long Term Goal 4: Patient will improve fine motor coordination in order to complete Nine  Hole Peg Test in 45' or less. Long Term Goal 5: Patient will attend to his left side 95% of the time when completing functional activities. Additional Long Term Goals?: Yes Long Term Goal 6: Patient will dress himself and bathe himself independently. Long Term Goal 6 Progress: Progressing toward goal Long Term Goal 7: Patient will improve to Brunnstrom stage V-VI in his left upper extremity in order to use his left arm as an active assist with all daily activiites.  Long Term Goal 7 Progress: Progressing toward goal Long Term Goal 8: Patient will increase coordination by decreasing completion time with Nine Hole Peg Test to less than 5'. Long Term Goal 8 Progress: Progressing toward goal Long Term Goal 9: Patient will improve proximal shoulder strength from poor to fair + for increased independence with washing his truck.  Long Term Goal 9 Progress: Progressing toward goal  Problem List Patient Active Problem List  Diagnosis  . Acute pancreatitis  . Cholelithiasis  . Hyperglycemia  . Current every day smoker  . Atrophy of right kidney  . Kidney calculi  . Hemiplegia, unspecified, affecting nondominant side  . Stented coronary artery  . Stroke, acute, embolic, right  . Seizure  . Cytotoxic cerebral edema  . Aortic thromboembolism  . Muscle weakness (generalized)  . Difficulty in walking  . Lack of coordination  . Left-sided neglect    End of Session Activity Tolerance: Patient tolerated treatment well General Behavior During Session: [facility] for tasks performed Cognition: [facility] for tasks performed OT Plan of Care Family Member Consulted: wife   Limmie Patricia, OTR/L,CBIS   08/22/2012, 2:12 PM

## 2012-08-24 ENCOUNTER — Ambulatory Visit (HOSPITAL_COMMUNITY)
Admission: RE | Admit: 2012-08-24 | Discharge: 2012-08-24 | Disposition: A | Discharge status: Home / Self Care | Payer: Medicare Other | Source: Ambulatory Visit | Attending: Physical Medicine & Rehabilitation | Admitting: Physical Medicine & Rehabilitation

## 2012-08-24 DIAGNOSIS — R279 Unspecified lack of coordination: Secondary | ICD-10-CM

## 2012-08-24 DIAGNOSIS — R414 Neurologic neglect syndrome: Secondary | ICD-10-CM

## 2012-08-24 NOTE — Progress Notes (Signed)
Occupational Therapy Treatment Patient Details  Name: Shaun Pugh MRN: [id number] Date of Birth: Oct 06, 1950  Today's Date: 08/24/2012 Time: 4098-1191 OT Time Calculation (min): 45 min NM re-ed 4782-9562 45'  Visit#: 34 of 36  Re-eval: 09/16/12    Authorization: Medicare Blue   Authorization Time Period: before 42 visit  Authorization Visit#: 34 of 42  Subjective Symptoms/Limitations Symptoms: S: My son is coming Friday to assemble a deck on our camper. I suppose I could hand him the screws to help him out. Pain Assessment Currently in Pain?: No/denies Pain Score: 0-No pain  Precautions/Restrictions  Precautions Precautions: Fall  Exercise/Treatments Neurological Re-education Weight Bearing Exercises Weight Bearing Position: Standing Standing; Reaching to shoulder: Wall stretch with flattened palm; 3x5; sliding hand down wall with each set; assist provided to hand and arm   Development of Reach  Board Exercises: Pt completed wall wash. Two walls washed successfully. Assisted provide to support arm for proper positioning. verbal cues needed to apply pressure to get palm of hand to wall.   Grasp and Release Thumb Opposition: [place] Manipulative Rate Test (MMRT) Board used to faciliate thumb opposition and in hand manipulation to flip discs from red to black. 1 top row flipped and placed on first row. vc's for technique at times   Occupational Therapy Assessment and Plan OT Assessment and Plan Clinical Impression Statement: A: Tolerated wall wash today without assistance from Rt. hand. Pt needed verbal cues for technique and support for LUE during task. OT Plan: P: Work on improving proximal shoulder stablity with functional tasks and add fine motor coordination activities as well.    Goals Short Term Goals Time to Complete Short Term Goals: 6 weeks Short Term Goal 1: Patient will be educated on HEP. Short Term Goal 2: Patient will increase AROM in his left shoulder to  75% for increased ablility to reach for objects when completing functional activiites.  Short Term Goal 3: Patient will increase left grip strength by 10 pounds and pinch strength by 5 pounds for increased ability to open containers when working on his cars. Short Term Goal 4: Patient will complete Nine Hole Peg Test in standardized fashion. Short Term Goal 5: Patient will attend to his left side 75% of the time when completing functional activities. Additional Short Term Goals?: Yes Short Term Goal 6: Patient will dress himself and bathe himself with min pa. Short Term Goal 7: Patient will decrease edema in left hand to minimal as he increases his functional use of his left hand.  Short Term Goal 8: Patient will use his left hand as a gross assist Ily with all daily activities. Short Term Goal 9: Patient will be able to reach forward and grasp his toothbrush with his left hand with minimal facilitation at his left shoulder.  Long Term Goals Time to Complete Long Term Goals: 12 weeks Long Term Goal 1: Patient will return to [facility] host activities and be able to feed himself a biscuit using his left hand. Long Term Goal 2: Patient will increase AROM in his left shoulder to 90% for increased ablility to reach for objects when completing functional activiites.  Long Term Goal 3: Patient will increase left grip strength by 40 pounds and pich strength by 10  pounds for increased ability to open containers when working on his cars. Long Term Goal 4: Patient will improve fine motor coordination in order to complete Nine Hole Peg Test in 44' or less. Long Term Goal 5: Patient will attend to  his left side 95% of the time when completing functional activities. Additional Long Term Goals?: Yes Long Term Goal 6: Patient will dress himself and bathe himself independently. Long Term Goal 7: Patient will improve to Brunnstrom stage V-VI in his left upper extremity in order to use his left arm as an active  assist with all daily activiites.  Long Term Goal 8: Patient will increase coordination by decreasing completion time with Nine Hole Peg Test to less than 5'. Long Term Goal 9: Patient will improve proximal shoulder strength from poor to fair + for increased independence with washing his truck.   Problem List Patient Active Problem List  Diagnosis  . Acute pancreatitis  . Cholelithiasis  . Hyperglycemia  . Current every day smoker  . Atrophy of right kidney  . Kidney calculi  . Hemiplegia, unspecified, affecting nondominant side  . Stented coronary artery  . Stroke, acute, embolic, right  . Seizure  . Cytotoxic cerebral edema  . Aortic thromboembolism  . Muscle weakness (generalized)  . Difficulty in walking  . Lack of coordination  . Left-sided neglect    End of Session Activity Tolerance: Patient tolerated treatment well General Cognition: [facility] for tasks performed OT Plan of Care Family Member Consulted: wife   Limmie Patricia, OTR/L,CBIS   08/24/2012, 2:29 PM

## 2012-08-26 ENCOUNTER — Ambulatory Visit (HOSPITAL_COMMUNITY)
Admission: RE | Admit: 2012-08-26 | Discharge: 2012-08-26 | Disposition: A | Discharge status: Home / Self Care | Payer: Medicare Other | Source: Ambulatory Visit | Attending: Physical Medicine & Rehabilitation | Admitting: Physical Medicine & Rehabilitation

## 2012-08-26 DIAGNOSIS — R279 Unspecified lack of coordination: Secondary | ICD-10-CM

## 2012-08-26 DIAGNOSIS — R414 Neurologic neglect syndrome: Secondary | ICD-10-CM

## 2012-08-26 NOTE — Progress Notes (Signed)
Occupational Therapy Treatment Patient Details  Name: Shaun Pugh MRN: [id number] Date of Birth: 04/07/1951  Today's Date: 08/26/2012 Time: 6213-0865 OT Time Calculation (min): 45 min NM re-ed 1300-1345 45'  Visit#: 35 of 45  Re-eval: 09/16/12    Authorization: Medicare Blue   Authorization Time Period: before 42 visit  Authorization Visit#: 35 of 42  Subjective Symptoms/Limitations Symptoms: S: My shoulder is really stiff today. Pain Assessment Currently in Pain?: Yes Pain Score:   2 Pain Location: Shoulder Pain Orientation: Left Pain Type: Acute pain  Precautions/Restrictions  Precautions Precautions: Fall  Exercise/Treatments Seated Other Seated Exercises: nu-step using UE only. Level 1 .14 miles 5'22" to stretch BUE shoulders and increase endurance and strength. Hand Exercises Other Hand Exercises: Thumb ADD/ABD 10X with thumb MCP and IP flexed into fist  Neurological Re-education Exercises Shoulder Flexion: AAROM;10 reps;Supine Shoulder Protraction: AAROM;10 reps;Supine Shoulder Horizontal ABduction: AAROM;10 reps;Supine Elbow Flexion: AAROM;10 reps;Supine Elbow Extension: AAROM;10 reps;Supine   Development of Reach  Board Exercises: Pt completed wall wash. One walls washed successfully. Assisted provide to support arm for proper positioning. verbal cues needed to apply pressure to get palm of hand to wall.         Weight Bearing Technique Weight Bearing Technique: Yes LUE Weight Bearing Technique: Extended arm seated Response to Weight Bearing Technique: Tolerated well; with palm flat on mat; 2 sets; 5x5  Occupational Therapy Assessment and Plan OT Assessment and Plan Clinical Impression Statement: A: Pt stated that lt shoulder was sore from wall washing activity at previous tx session. Performed  AAROM supine to stretch shoulder joint in a pain free zone.  OT Plan: P: Work on improving proximal shoulder stablity with functional tasks and add fine motor  coordination activities as well.    Goals Short Term Goals Time to Complete Short Term Goals: 6 weeks Short Term Goal 1: Patient will be educated on HEP. Short Term Goal 2: Patient will increase AROM in his left shoulder to 75% for increased ablility to reach for objects when completing functional activiites.  Short Term Goal 3: Patient will increase left grip strength by 10 pounds and pinch strength by 5 pounds for increased ability to open containers when working on his cars. Short Term Goal 4: Patient will complete Nine Hole Peg Test in standardized fashion. Short Term Goal 5: Patient will attend to his left side 75% of the time when completing functional activities. Additional Short Term Goals?: Yes Short Term Goal 6: Patient will dress himself and bathe himself with min pa. Short Term Goal 7: Patient will decrease edema in left hand to minimal as he increases his functional use of his left hand.  Short Term Goal 8: Patient will use his left hand as a gross assist Ily with all daily activities. Short Term Goal 9: Patient will be able to reach forward and grasp his toothbrush with his left hand with minimal facilitation at his left shoulder.  Long Term Goals Time to Complete Long Term Goals: 12 weeks Long Term Goal 1: Patient will return to [facility] host activities and be able to feed himself a biscuit using his left hand. Long Term Goal 2: Patient will increase AROM in his left shoulder to 90% for increased ablility to reach for objects when completing functional activiites.  Long Term Goal 3: Patient will increase left grip strength by 40 pounds and pich strength by 10  pounds for increased ability to open containers when working on his cars. Long Term Goal 4: Patient  will improve fine motor coordination in order to complete Nine Hole Peg Test in 45' or less. Long Term Goal 5: Patient will attend to his left side 95% of the time when completing functional activities. Additional Long Term  Goals?: Yes Long Term Goal 6: Patient will dress himself and bathe himself independently. Long Term Goal 7: Patient will improve to Brunnstrom stage V-VI in his left upper extremity in order to use his left arm as an active assist with all daily activiites.  Long Term Goal 8: Patient will increase coordination by decreasing completion time with Nine Hole Peg Test to less than 5'. Long Term Goal 9: Patient will improve proximal shoulder strength from poor to fair + for increased independence with washing his truck.   Problem List Patient Active Problem List  Diagnosis  . Acute pancreatitis  . Cholelithiasis  . Hyperglycemia  . Current every day smoker  . Atrophy of right kidney  . Kidney calculi  . Hemiplegia, unspecified, affecting nondominant side  . Stented coronary artery  . Stroke, acute, embolic, right  . Seizure  . Cytotoxic cerebral edema  . Aortic thromboembolism  . Muscle weakness (generalized)  . Difficulty in walking  . Lack of coordination  . Left-sided neglect    End of Session Activity Tolerance: Patient tolerated treatment well General Cognition: WFL for tasks performed OT Plan of Care Family Member Consulted: wife   Limmie Patricia, OTR/L,CBIS   08/26/2012, 2:00 PM

## 2012-08-29 ENCOUNTER — Ambulatory Visit (HOSPITAL_COMMUNITY)
Admission: RE | Admit: 2012-08-29 | Discharge: 2012-08-29 | Disposition: A | Discharge status: Home / Self Care | Payer: Medicare Other | Source: Ambulatory Visit | Attending: Physical Medicine & Rehabilitation | Admitting: Physical Medicine & Rehabilitation

## 2012-08-29 ENCOUNTER — Encounter (HOSPITAL_COMMUNITY): Payer: Self-pay

## 2012-08-29 DIAGNOSIS — R279 Unspecified lack of coordination: Secondary | ICD-10-CM

## 2012-08-29 DIAGNOSIS — R414 Neurologic neglect syndrome: Secondary | ICD-10-CM

## 2012-08-29 NOTE — Progress Notes (Signed)
Occupational Therapy Treatment Patient Details  Name: Shaun Pugh MRN: [id number] Date of Birth: [birth year]  Today's Date: 08/29/2012 Time: 1300-1350 OT Time Calculation (min): 50 min NM re-ed 1300-1350 50'  Visit#: 36 of 45  Re-eval: 09/16/12    Authorization: Medicare Blue   Authorization Time Period: before 42 visit  Authorization Visit#: 36 of 42  Subjective Symptoms/Limitations Symptoms: S: I  slid off the bed this morning when I wasn puting my pants on. I was all alone in the house so I had to get myself off the floor. Pain Assessment Pain Score:   2 Pain Location: Shoulder Pain Orientation: Left Pain Type: Acute pain  Precautions/Restrictions  Precautions Precautions: Fall  Exercise/Treatments Seated Extension: Theraband;10 reps Theraband Level (Shoulder Extension): Level 2 (Red) Row: Theraband;10 reps Theraband Level (Shoulder Row): Level 2 (Red) Flexion: Theraband;10 reps Theraband Level (Shoulder Flexion): Level 2 (Red) Other Seated Exercises: nu-step using UE only. Level 1 .16 miles 5'30" to stretch BUE shoulders and increase endurance and strength. Hand Exercises Sponges: gross grasp and release; two sponges at a time and released into container on top of table.  Neurological Re-education Exercises Sponges: gross grasp and release; two sponges at a time and released into container on top of table.  Development of Reach  Saebo Crate; Left: Standing; pt grasped Saebo ball from crate and bounced ball on table top in attempt to bounce ball into empty crate. vc's to use increased force. Saebo Graduated Cylinders: performed standing with LUE; Min-mod difficulty depending on height of cylinder   Occupational Therapy Assessment and Plan OT Assessment and Plan Clinical Impression Statement: A: Pt showing increased functional reach with LUE. Pt still compensating on times when fatigued as he leans to help raise his arm during reaching activities. OT Plan: P: Work  on [employer].   Goals Short Term Goals Time to Complete Short Term Goals: 6 weeks Short Term Goal 1: Patient will be educated on HEP. Short Term Goal 2: Patient will increase AROM in his left shoulder to 75% for increased ablility to reach for objects when completing functional activiites.  Short Term Goal 2 Progress: Progressing toward goal Short Term Goal 3: Patient will increase left grip strength by 10 pounds and pinch strength by 5 pounds for increased ability to open containers when working on his cars. Short Term Goal 4: Patient will complete Nine Hole Peg Test in standardized fashion. Short Term Goal 5: Patient will attend to his left side 75% of the time when completing functional activities. Additional Short Term Goals?: Yes Short Term Goal 6: Patient will dress himself and bathe himself with min pa. Short Term Goal 7: Patient will decrease edema in left hand to minimal as he increases his functional use of his left hand.  Short Term Goal 8: Patient will use his left hand as a gross assist Ily with all daily activities. Short Term Goal 9: Patient will be able to reach forward and grasp his toothbrush with his left hand with minimal facilitation at his left shoulder.  Long Term Goals Time to Complete Long Term Goals: 12 weeks Long Term Goal 1: Patient will return to [facility] host activities and be able to feed himself a biscuit using his left hand. Long Term Goal 1 Progress: Progressing toward goal Long Term Goal 2: Patient will increase AROM in his left shoulder to 90% for increased ablility to reach for objects when completing functional activiites.  Long Term Goal 2 Progress: Progressing toward goal Long Term Goal  3: Patient will increase left grip strength by 40 pounds and pich strength by 10  pounds for increased ability to open containers when working on his cars. Long Term Goal 3 Progress: Progressing toward goal Long Term Goal 4: Patient will improve fine motor  coordination in order to complete Nine Hole Peg Test in 45' or less. Long Term Goal 5: Patient will attend to his left side 95% of the time when completing functional activities. Additional Long Term Goals?: Yes Long Term Goal 6: Patient will dress himself and bathe himself independently. Long Term Goal 6 Progress: Progressing toward goal Long Term Goal 7: Patient will improve to Brunnstrom stage V-VI in his left upper extremity in order to use his left arm as an active assist with all daily activiites.  Long Term Goal 7 Progress: Progressing toward goal Long Term Goal 8: Patient will increase coordination by decreasing completion time with Nine Hole Peg Test to less than 5'. Long Term Goal 8 Progress: Progressing toward goal Long Term Goal 9: Patient will improve proximal shoulder strength from poor to fair + for increased independence with washing his truck.  Long Term Goal 9 Progress: Progressing toward goal  Problem List Patient Active Problem List  Diagnosis  . Acute pancreatitis  . Cholelithiasis  . Hyperglycemia  . Current every day smoker  . Atrophy of right kidney  . Kidney calculi  . Hemiplegia, unspecified, affecting nondominant side  . Stented coronary artery  . Stroke, acute, embolic, right  . Seizure  . Cytotoxic cerebral edema  . Aortic thromboembolism  . Muscle weakness (generalized)  . Difficulty in walking  . Lack of coordination  . Left-sided neglect    End of Session Activity Tolerance: Patient tolerated treatment well General Behavior During Therapy: [facility] for tasks assessed/performed Cognition: WFL for tasks performed    Limmie Patricia, OTR/L,CBIS   08/29/2012, 2:00 PM

## 2012-08-31 ENCOUNTER — Ambulatory Visit (HOSPITAL_COMMUNITY)
Admission: RE | Admit: 2012-08-31 | Discharge: 2012-08-31 | Disposition: A | Discharge status: Home / Self Care | Payer: Medicare Other | Source: Ambulatory Visit | Attending: Physical Medicine & Rehabilitation | Admitting: Physical Medicine & Rehabilitation

## 2012-08-31 DIAGNOSIS — R414 Neurologic neglect syndrome: Secondary | ICD-10-CM

## 2012-08-31 DIAGNOSIS — R279 Unspecified lack of coordination: Secondary | ICD-10-CM

## 2012-08-31 NOTE — Progress Notes (Signed)
Occupational Therapy Treatment Patient Details  Name: Shaun Pugh MRN: [id number] Date of Birth: 1950/07/28  Today's Date: 08/31/2012 Time: 1478-2956 OT Time Calculation (min): 42 min Self-care 2130-8657 27' NM re-ed 1415-1430 15'  Visit#: 37 of 45  Re-eval: 09/16/12    Authorization: Medicare Blue   Authorization Time Period: before 42 visit  Authorization Visit#: 37 of 42  Subjective Symptoms/Limitations Symptoms: S: I think I may of had a mini stroke last night. I went to the bathroom in the middle of the night and when I came back I couldn't walk around the bed or get into bed. My eyes were burning too. Pain Assessment Currently in Pain?: No/denies  Precautions/Restrictions  Precautions Precautions: Fall  Exercise/Treatments Hand Exercises Sponges: gross grasp and release; two sponges at a time and released into container on top of table. Other Hand Exercises: 10 clothespins placed on side of container to increase pinch strength and shoulder flexion. vc's to depress shoulder and not lean to the right when performing shoulder flexion.      Activities of Daily Living Activities of Daily Living: Pt participated in simple meal prep task of making ice cream in ADL kitchen. Session with focus on using Left hand for all meal prep tasks as able. vc's needed to remember to use left hand as gross assist.  Occupational Therapy Assessment and Plan OT Assessment and Plan Clinical Impression Statement: A: Patient enjoyed simple meal prep task. Needed vc's to rememember to use left arm to provide gross assist. Still requres vc's to depress shoulder during shoulder flexion when performing reaching activities. OT Plan: P: Work on depressing shoulder when perfoming functional reaching activties.    Goals Short Term Goals Time to Complete Short Term Goals: 6 weeks Short Term Goal 1: Patient will be educated on HEP. Short Term Goal 2: Patient will increase AROM in his left shoulder to  75% for increased ablility to reach for objects when completing functional activiites.  Short Term Goal 3: Patient will increase left grip strength by 10 pounds and pinch strength by 5 pounds for increased ability to open containers when working on his cars. Short Term Goal 4: Patient will complete Nine Hole Peg Test in standardized fashion. Short Term Goal 5: Patient will attend to his left side 75% of the time when completing functional activities. Additional Short Term Goals?: Yes Short Term Goal 6: Patient will dress himself and bathe himself with min pa. Short Term Goal 7: Patient will decrease edema in left hand to minimal as he increases his functional use of his left hand.  Short Term Goal 8: Patient will use his left hand as a gross assist Ily with all daily activities. Short Term Goal 9: Patient will be able to reach forward and grasp his toothbrush with his left hand with minimal facilitation at his left shoulder.  Long Term Goals Time to Complete Long Term Goals: 12 weeks Long Term Goal 1: Patient will return to [facility] host activities and be able to feed himself a biscuit using his left hand. Long Term Goal 2: Patient will increase AROM in his left shoulder to 90% for increased ablility to reach for objects when completing functional activiites.  Long Term Goal 3: Patient will increase left grip strength by 40 pounds and pich strength by 10  pounds for increased ability to open containers when working on his cars. Long Term Goal 4: Patient will improve fine motor coordination in order to complete Nine Hole Peg Test in 62' or  less. Long Term Goal 5: Patient will attend to his left side 95% of the time when completing functional activities. Additional Long Term Goals?: Yes Long Term Goal 6: Patient will dress himself and bathe himself independently. Long Term Goal 7: Patient will improve to Brunnstrom stage V-VI in his left upper extremity in order to use his left arm as an active  assist with all daily activiites.  Long Term Goal 8: Patient will increase coordination by decreasing completion time with Nine Hole Peg Test to less than 5'. Long Term Goal 9: Patient will improve proximal shoulder strength from poor to fair + for increased independence with washing his truck.   Problem List Patient Active Problem List  Diagnosis  . Acute pancreatitis  . Cholelithiasis  . Hyperglycemia  . Current every day smoker  . Atrophy of right kidney  . Kidney calculi  . Hemiplegia, unspecified, affecting nondominant side  . Stented coronary artery  . Stroke, acute, embolic, right  . Seizure  . Cytotoxic cerebral edema  . Aortic thromboembolism  . Muscle weakness (generalized)  . Difficulty in walking  . Lack of coordination  . Left-sided neglect    End of Session Activity Tolerance: Patient tolerated treatment well General Behavior During Therapy: WFL for tasks assessed/performed Cognition: WFL for tasks performed OT Plan of Care Family Member Consulted: wife   Limmie Patricia, OTR/L,CBIS   08/31/2012, 2:55 PM

## 2012-09-02 ENCOUNTER — Ambulatory Visit (HOSPITAL_COMMUNITY)
Admission: RE | Admit: 2012-09-02 | Discharge: 2012-09-02 | Disposition: A | Discharge status: Home / Self Care | Payer: Medicare Other | Source: Ambulatory Visit | Attending: Physical Medicine & Rehabilitation | Admitting: Physical Medicine & Rehabilitation

## 2012-09-02 DIAGNOSIS — R279 Unspecified lack of coordination: Secondary | ICD-10-CM

## 2012-09-02 DIAGNOSIS — R414 Neurologic neglect syndrome: Secondary | ICD-10-CM

## 2012-09-02 NOTE — Progress Notes (Signed)
Occupational Therapy Treatment Patient Details  Name: JACOLBY RISBY MRN: [id number] Date of Birth: [birth year]  Today's Date: 09/02/2012 Time: 1300-1345 OT Time Calculation (min): 45 min Nm re-ed 1300-1345 45'  Visit#: 38 of 45  Re-eval: 09/16/12    Authorization: Medicare Blue   Authorization Time Period: before 42 visit  Authorization Visit#: 38 of 42  Subjective Symptoms/Limitations Symptoms: S: I was driving the golf cart to get firewood and i was too close to the cement building. When I turned around towards the cart, I scraped my left elbow. Pain Assessment Currently in Pain?: No/denies  Precautions/Restrictions  Precautions Precautions: Fall  Exercise/Treatments Seated Other Seated Exercises: nu-step using UE only. Level 1 .16 miles 5'30" to stretch BUE shoulders and increase endurance and strength. Neurological Re-education Development of Reach  Development of Reach: Saebo;Reaching Reaching to Shoulder Height: Pt required to pull velcro numbers from wall at various levels to work on shoulder flexion and grasp and reach; facilitation to depress shoulder when performing shoulder flexion. Pt still had habit of leaning upper body to right to compensate for shoulder weakness.[name]; Left: sitting edge of mat pt grasped Saebo balls on left side requiring Lt arm to abduct and place balls in empty crate on left side of body; mod difficulty Board Exercises: Pt completed wall wash. One walls washed successfully. Assisted provide to support arm for proper positioning. verbal cues needed to apply pressure to get palm of hand to wall.    Occupational Therapy Assessment and Plan OT Assessment and Plan Clinical Impression Statement: A: All activties focused on development of reach and trying to eliminate extensory syngery of LUE. Pt is presents with his LUE adducted and internally rotated.  OT Plan: P: Work on depressing shoulder when perfoming functional reaching activties.  Incorporate activities requiring shoulder adduction and reaching out to the left.   Goals Short Term Goals Time to Complete Short Term Goals: 6 weeks Short Term Goal 1: Patient will be educated on HEP. Short Term Goal 2: Patient will increase AROM in his left shoulder to 75% for increased ablility to reach for objects when completing functional activiites.  Short Term Goal 3: Patient will increase left grip strength by 10 pounds and pinch strength by 5 pounds for increased ability to open containers when working on his cars. Short Term Goal 4: Patient will complete Nine Hole Peg Test in standardized fashion. Short Term Goal 5: Patient will attend to his left side 75% of the time when completing functional activities. Additional Short Term Goals?: Yes Short Term Goal 6: Patient will dress himself and bathe himself with min pa. Short Term Goal 7: Patient will decrease edema in left hand to minimal as he increases his functional use of his left hand.  Short Term Goal 8: Patient will use his left hand as a gross assist Ily with all daily activities. Short Term Goal 9: Patient will be able to reach forward and grasp his toothbrush with his left hand with minimal facilitation at his left shoulder.  Long Term Goals Time to Complete Long Term Goals: 12 weeks Long Term Goal 1: Patient will return to [facility] host activities and be able to feed himself a biscuit using his left hand. Long Term Goal 2: Patient will increase AROM in his left shoulder to 90% for increased ablility to reach for objects when completing functional activiites.  Long Term Goal 3: Patient will increase left grip strength by 40 pounds and pich strength by 10  pounds for  increased ability to open containers when working on his cars. Long Term Goal 4: Patient will improve fine motor coordination in order to complete Nine Hole Peg Test in 9' or less. Long Term Goal 5: Patient will attend to his left side 95% of the time when  completing functional activities. Additional Long Term Goals?: Yes Long Term Goal 6: Patient will dress himself and bathe himself independently. Long Term Goal 7: Patient will improve to Brunnstrom stage V-VI in his left upper extremity in order to use his left arm as an active assist with all daily activiites.  Long Term Goal 8: Patient will increase coordination by decreasing completion time with Nine Hole Peg Test to less than 5'. Long Term Goal 9: Patient will improve proximal shoulder strength from poor to fair + for increased independence with washing his truck.   Problem List Patient Active Problem List  Diagnosis  . Acute pancreatitis  . Cholelithiasis  . Hyperglycemia  . Current every day smoker  . Atrophy of right kidney  . Kidney calculi  . Hemiplegia, unspecified, affecting nondominant side  . Stented coronary artery  . Stroke, acute, embolic, right  . Seizure  . Cytotoxic cerebral edema  . Aortic thromboembolism  . Muscle weakness (generalized)  . Difficulty in walking  . Lack of coordination  . Left-sided neglect    End of Session Activity Tolerance: Patient tolerated treatment well General Behavior During Therapy: [facility] for tasks assessed/performed Cognition: WFL for tasks performed   Limmie Patricia, OTR/L,CBIS   09/02/2012, 3:04 PM

## 2012-09-05 ENCOUNTER — Ambulatory Visit (HOSPITAL_COMMUNITY)
Admission: RE | Admit: 2012-09-05 | Discharge: 2012-09-05 | Disposition: A | Discharge status: Home / Self Care | Payer: Medicare Other | Source: Ambulatory Visit | Attending: Physical Medicine & Rehabilitation | Admitting: Physical Medicine & Rehabilitation

## 2012-09-05 DIAGNOSIS — R414 Neurologic neglect syndrome: Secondary | ICD-10-CM

## 2012-09-05 DIAGNOSIS — R279 Unspecified lack of coordination: Secondary | ICD-10-CM

## 2012-09-05 NOTE — Progress Notes (Signed)
Occupational Therapy Treatment Patient Details  Name: Shaun Pugh MRN: [id number] Date of Birth: Sep 13, 1950  Today's Date: 09/05/2012 Time: 4098-1191 OT Time Calculation (min): 50 min NM re-ed 4782-9562 50'  Visit#: 39 of 45  Re-eval: 09/16/12    Authorization: Medicare Blue   Authorization Time Period: before 42 visit  Authorization Visit#: 39 of 42  Subjective Symptoms/Limitations Symptoms: S: My shoulder feels stiff today. Pain Assessment Currently in Pain?: No/denies  Precautions/Restrictions  Precautions Precautions: Fall  Exercise/Treatments Neurological Re-education  Development of Reach  Development of Reach: Saebo;Reaching Reaching to Shoulder Height: Pt required to pull velcro numbers from wall at various levels to work on shoulder flexion and grasp and reach; facilitation to depress shoulder when performing shoulder flexion. Pt still had habit of leaning upper body to right to compensate for shoulder weakness.Shaun Pugh; Left: sitting edge of mat pt grasped Saebo balls on left side requiring Lt arm to abduct and place balls in empty Pugh on left side of body; mod difficulty Board Exercises: Pt completed wall wash. One walls washed successfully. Assisted provide to support arm for proper positioning. verbal cues needed to apply pressure to get palm of hand to wall.    09/05/12 1500  Shoulder Exercises: Seated  Other Seated Exercises nu-step using UE only. Level 1 .14 miles 5 to stretch BUE shoulders and increase endurance and strength.    Occupational Therapy Assessment and Plan OT Assessment and Plan Clinical Impression Statement: A: Noticed increased swelling on lateral side of neck. Increased stiffness in Rt. shoulder during reaching activities. Educated patient to use Lt arm as much as [place].  OT Plan: P: Weightbearing actvities.   Goals Short Term Goals Time to Complete Short Term Goals: 6 weeks Short Term Goal 1: Patient will be  educated on HEP. Short Term Goal 2: Patient will increase AROM in his left shoulder to 75% for increased ablility to reach for objects when completing functional activiites.  Short Term Goal 2 Progress: Progressing toward goal Short Term Goal 3: Patient will increase left grip strength by 10 pounds and pinch strength by 5 pounds for increased ability to open containers when working on his cars. Short Term Goal 4: Patient will complete Nine Hole Peg Test in standardized fashion. Short Term Goal 5: Patient will attend to his left side 75% of the time when completing functional activities. Additional Short Term Goals?: Yes Short Term Goal 6: Patient will dress himself and bathe himself with min pa. Short Term Goal 7: Patient will decrease edema in left hand to minimal as he increases his functional use of his left hand.  Short Term Goal 8: Patient will use his left hand as a gross assist Ily with all daily activities. Short Term Goal 8 Progress: Progressing toward goal Short Term Goal 9: Patient will be able to reach forward and grasp his toothbrush with his left hand with minimal facilitation at his left shoulder.  Long Term Goals Time to Complete Long Term Goals: 12 weeks Long Term Goal 1: Patient will return to [facility] host activities and be able to feed himself a biscuit using his left hand. Long Term Goal 1 Progress: Progressing toward goal Long Term Goal 2: Patient will increase AROM in his left shoulder to 90% for increased ablility to reach for objects when completing functional activiites.  Long Term Goal 2 Progress: Progressing toward goal Long Term Goal 3: Patient will increase left grip strength by 40 pounds and pich strength by 10  pounds for increased ability to open containers when working on his cars. Long Term Goal 3 Progress: Progressing toward goal Long Term Goal 4: Patient will improve fine motor coordination in order to complete Nine Hole Peg Test in 45' or less. Long Term  Goal 5: Patient will attend to his left side 95% of the time when completing functional activities. Additional Long Term Goals?: Yes Long Term Goal 6: Patient will dress himself and bathe himself independently. Long Term Goal 6 Progress: Progressing toward goal Long Term Goal 7: Patient will improve to Brunnstrom stage V-VI in his left upper extremity in order to use his left arm as an active assist with all daily activiites.  Long Term Goal 7 Progress: Progressing toward goal Long Term Goal 8: Patient will increase coordination by decreasing completion time with Nine Hole Peg Test to less than 5'. Long Term Goal 8 Progress: Progressing toward goal Long Term Goal 9: Patient will improve proximal shoulder strength from poor to fair + for increased independence with washing his truck.  Long Term Goal 9 Progress: Progressing toward goal  Problem List Patient Active Problem List   Diagnosis Date Noted  . Muscle weakness (generalized) 05/31/2012  . Difficulty in walking 05/31/2012  . Lack of coordination 05/31/2012  . Left-sided neglect 05/31/2012  . Cytotoxic cerebral edema 03/19/2012  . Aortic thromboembolism 03/19/2012  . Stroke, acute, embolic, right 03/18/2012  . Seizure 03/18/2012  . Stented coronary artery   . Hemiplegia, unspecified, affecting nondominant side 03/17/2012  . Atrophy of right kidney 09/15/2011  . Kidney calculi 09/15/2011  . Acute pancreatitis 09/14/2011  . Cholelithiasis 09/14/2011  . Hyperglycemia 09/14/2011  . Current every day smoker 09/14/2011    End of Session Activity Tolerance: Patient tolerated treatment well General Behavior During Therapy: [facility] for tasks assessed/performed Cognition: [facility] for tasks performed   Limmie Patricia, OTR/L,CBIS   09/05/2012, 3:17 PM

## 2012-09-07 ENCOUNTER — Ambulatory Visit (HOSPITAL_COMMUNITY)
Admission: RE | Admit: 2012-09-07 | Discharge: 2012-09-07 | Disposition: A | Discharge status: Home / Self Care | Payer: Medicare Other | Source: Ambulatory Visit | Attending: Physical Medicine & Rehabilitation | Admitting: Physical Medicine & Rehabilitation

## 2012-09-07 DIAGNOSIS — R279 Unspecified lack of coordination: Secondary | ICD-10-CM

## 2012-09-07 DIAGNOSIS — R414 Neurologic neglect syndrome: Secondary | ICD-10-CM

## 2012-09-07 NOTE — Progress Notes (Signed)
Occupational Therapy Treatment Patient Details  Name: CORTNEY MCKINNEY MRN: [id number] Date of Birth: 06/24/1950  Today's Date: 09/07/2012 Time: 1350-1430 OT Time Calculation (min): 40 min NM re-ed 1350-1430 40'  Visit#: 40 of 45  Re-eval: 09/16/12    Authorization: Medicare Blue   Authorization Time Period: before 42 visit  Authorization Visit#: 40 of 42  Subjective Symptoms/Limitations Symptoms: S: my shoulder is not as stiff today. Pain Assessment Currently in Pain?: No/denies  Precautions/Restrictions  Precautions Precautions: Fall  Exercise/Treatments Seated Other Seated Exercises: nu-step using UE only. Level 1 .22 miles 8' to stretch BUE shoulders and increase endurance and strength. Neurological Re-education  Development of Reach  Reaching to Shoulder Height: Pt required to place velcro numbers on wall at various levels to work on shoulder flexion and grasp and reach; facilitation to depress shoulder when performing shoulder flexion.   Occupational Therapy Assessment and Plan OT Assessment and Plan Clinical Impression Statement: A: Patient with decreased stiffness in shoulder this tx session.  OT Plan: P: Weightbearing actvities.   Goals Short Term Goals Time to Complete Short Term Goals: 6 weeks Short Term Goal 1: Patient will be educated on HEP. Short Term Goal 2: Patient will increase AROM in his left shoulder to 75% for increased ablility to reach for objects when completing functional activiites.  Short Term Goal 3: Patient will increase left grip strength by 10 pounds and pinch strength by 5 pounds for increased ability to open containers when working on his cars. Short Term Goal 4: Patient will complete Nine Hole Peg Test in standardized fashion. Short Term Goal 5: Patient will attend to his left side 75% of the time when completing functional activities. Additional Short Term Goals?: Yes Short Term Goal 6: Patient will dress himself and bathe himself with  min pa. Short Term Goal 7: Patient will decrease edema in left hand to minimal as he increases his functional use of his left hand.  Short Term Goal 8: Patient will use his left hand as a gross assist Ily with all daily activities. Short Term Goal 9: Patient will be able to reach forward and grasp his toothbrush with his left hand with minimal facilitation at his left shoulder.  Long Term Goals Time to Complete Long Term Goals: 12 weeks Long Term Goal 1: Patient will return to [facility] host activities and be able to feed himself a biscuit using his left hand. Long Term Goal 2: Patient will increase AROM in his left shoulder to 90% for increased ablility to reach for objects when completing functional activiites.  Long Term Goal 3: Patient will increase left grip strength by 40 pounds and pich strength by 10  pounds for increased ability to open containers when working on his cars. Long Term Goal 4: Patient will improve fine motor coordination in order to complete Nine Hole Peg Test in 63' or less. Long Term Goal 5: Patient will attend to his left side 95% of the time when completing functional activities. Additional Long Term Goals?: Yes Long Term Goal 6: Patient will dress himself and bathe himself independently. Long Term Goal 7: Patient will improve to Brunnstrom stage V-VI in his left upper extremity in order to use his left arm as an active assist with all daily activiites.  Long Term Goal 8: Patient will increase coordination by decreasing completion time with Nine Hole Peg Test to less than 5'. Long Term Goal 9: Patient will improve proximal shoulder strength from poor to fair + for increased independence  with washing his truck.   Problem List Patient Active Problem List   Diagnosis Date Noted  . Muscle weakness (generalized) 05/31/2012  . Difficulty in walking 05/31/2012  . Lack of coordination 05/31/2012  . Left-sided neglect 05/31/2012  . Cytotoxic cerebral edema 03/19/2012  .  Aortic thromboembolism 03/19/2012  . Stroke, acute, embolic, right 03/18/2012  . Seizure 03/18/2012  . Stented coronary artery   . Hemiplegia, unspecified, affecting nondominant side 03/17/2012  . Atrophy of right kidney 09/15/2011  . Kidney calculi 09/15/2011  . Acute pancreatitis 09/14/2011  . Cholelithiasis 09/14/2011  . Hyperglycemia 09/14/2011  . Current every day smoker 09/14/2011    End of Session Activity Tolerance: Patient tolerated treatment well General Behavior During Therapy: [facility] for tasks assessed/performed Cognition: [facility] for tasks performed   Limmie Patricia, OTR/L,CBIS   09/07/2012, 2:32 PM

## 2012-09-09 ENCOUNTER — Ambulatory Visit (HOSPITAL_COMMUNITY)
Admission: RE | Admit: 2012-09-09 | Discharge: 2012-09-09 | Disposition: A | Discharge status: Home / Self Care | Payer: Medicare Other | Source: Ambulatory Visit | Attending: Physical Medicine & Rehabilitation | Admitting: Physical Medicine & Rehabilitation

## 2012-09-09 DIAGNOSIS — M6281 Muscle weakness (generalized): Secondary | ICD-10-CM | POA: Insufficient documentation

## 2012-09-09 DIAGNOSIS — R279 Unspecified lack of coordination: Secondary | ICD-10-CM | POA: Insufficient documentation

## 2012-09-09 DIAGNOSIS — R262 Difficulty in walking, not elsewhere classified: Secondary | ICD-10-CM | POA: Insufficient documentation

## 2012-09-09 DIAGNOSIS — IMO0001 Reserved for inherently not codable concepts without codable children: Secondary | ICD-10-CM | POA: Insufficient documentation

## 2012-09-09 DIAGNOSIS — R414 Neurologic neglect syndrome: Secondary | ICD-10-CM

## 2012-09-09 NOTE — Progress Notes (Signed)
Occupational Therapy Treatment Patient Details  Name: Shaun Pugh MRN: [id number] Date of Birth: [birth year]  Today's Date: 09/09/2012 Time: [id number] OT Time Calculation (min): 50 min Therex [id number] 50'  Visit#: 41 of 45  Re-eval: 09/16/12    Authorization: Medicare Blue   Authorization Time Period: before 42 visit  Authorization Visit#: 41 of 42  Subjective Symptoms/Limitations Symptoms: S: I know what I've been doing to my shoulder. I've been hitting it on the edge of the truck when I get in. Pain Assessment Currently in Pain?: No/denies  Precautions/Restrictions  Precautions Precautions: Fall  Exercise/Treatments Activities of Daily Living Activities of Daily Living: GRASP HEP Total Arm Stretch 2x10, Shoulder shrug 3x10,Trunk rotation 3x5, wrist flexion/extension 3x5, table push-ups 5x10, chair-ups 5X, shoulder flexion 3x5, Shoulder ABD 3x5, Elbow flexion 3x5, wrist extension 3x5, stress ball squeeze, 3x10, yellow theraputty isometric each digit, 2 sets 5X, theraputty twist.   Occupational Therapy Assessment and Plan OT Assessment and Plan Clinical Impression Statement: A: Introduced GRASP HEP. Reviewed half of exercises with patient return demonstration.  OT Plan: P: Finish reviewing GRASP HEP and update g code.   Goals Short Term Goals Time to Complete Short Term Goals: 6 weeks Short Term Goal 1: Patient will be educated on HEP. Short Term Goal 2: Patient will increase AROM in his left shoulder to 75% for increased ablility to reach for objects when completing functional activiites.  Short Term Goal 3: Patient will increase left grip strength by 10 pounds and pinch strength by 5 pounds for increased ability to open containers when working on his cars. Short Term Goal 4: Patient will complete Nine Hole Peg Test in standardized fashion. Short Term Goal 5: Patient will attend to his left side 75% of the time when completing functional activities. Additional Short Term  Goals?: Yes Short Term Goal 6: Patient will dress himself and bathe himself with min pa. Short Term Goal 7: Patient will decrease edema in left hand to minimal as he increases his functional use of his left hand.  Short Term Goal 8: Patient will use his left hand as a gross assist Ily with all daily activities. Short Term Goal 9: Patient will be able to reach forward and grasp his toothbrush with his left hand with minimal facilitation at his left shoulder.  Long Term Goals Time to Complete Long Term Goals: 12 weeks Long Term Goal 1: Patient will return to [facility] host activities and be able to feed himself a biscuit using his left hand. Long Term Goal 2: Patient will increase AROM in his left shoulder to 90% for increased ablility to reach for objects when completing functional activiites.  Long Term Goal 3: Patient will increase left grip strength by 40 pounds and pich strength by 10  pounds for increased ability to open containers when working on his cars. Long Term Goal 4: Patient will improve fine motor coordination in order to complete Nine Hole Peg Test in 57' or less. Long Term Goal 5: Patient will attend to his left side 95% of the time when completing functional activities. Additional Long Term Goals?: Yes Long Term Goal 6: Patient will dress himself and bathe himself independently. Long Term Goal 7: Patient will improve to Brunnstrom stage V-VI in his left upper extremity in order to use his left arm as an active assist with all daily activiites.  Long Term Goal 8: Patient will increase coordination by decreasing completion time with Nine Hole Peg Test to less than 5'. Long Term Goal  9: Patient will improve proximal shoulder strength from poor to fair + for increased independence with washing his truck.   Problem List Patient Active Problem List   Diagnosis Date Noted  . Muscle weakness (generalized) 05/31/2012  . Difficulty in walking 05/31/2012  . Lack of coordination  05/31/2012  . Left-sided neglect 05/31/2012  . Cytotoxic cerebral edema 03/19/2012  . Aortic thromboembolism 03/19/2012  . Stroke, acute, embolic, right 03/18/2012  . Seizure 03/18/2012  . Stented coronary artery   . Hemiplegia, unspecified, affecting nondominant side 03/17/2012  . Atrophy of right kidney 09/15/2011  . Kidney calculi 09/15/2011  . Acute pancreatitis 09/14/2011  . Cholelithiasis 09/14/2011  . Hyperglycemia 09/14/2011  . Current every day smoker 09/14/2011    End of Session Activity Tolerance: Patient tolerated treatment well General Behavior During Therapy: [facility] for tasks assessed/performed Cognition: WFL for tasks performed OT Plan of Care OT Home Exercise Plan: GRASP  OT Patient Instructions: handout, verbal, and demonstration Consulted and Agree with Plan of Care: Patient    Limmie Patricia, OTR/L,CBIS   09/09/2012, 11:39 AM

## 2012-09-12 ENCOUNTER — Ambulatory Visit (HOSPITAL_COMMUNITY)
Admission: RE | Admit: 2012-09-12 | Discharge: 2012-09-12 | Disposition: A | Discharge status: Home / Self Care | Payer: Medicare Other | Source: Ambulatory Visit | Attending: Physical Medicine & Rehabilitation | Admitting: Physical Medicine & Rehabilitation

## 2012-09-12 NOTE — Evaluation (Signed)
Occupational Therapy Re-Evaluation  Patient Details  Name: Shaun Pugh MRN: [id number] Date of Birth: March 06, 1951  Today's Date: 09/12/2012 Time: 1340-1430 OT Time Calculation (min): 50 min Therex 1340- 1348 8' Reassess [id number] 42'  Visit#: 42 of 45  Re-eval: 09/16/12     Authorization: Medicare Blue   Authorization Time Period: before 52 visit  Authorization Visit#: 42 of 52   Past Medical History:  Past Medical History  Diagnosis Date  . High cholesterol   . Atrophy of right kidney 09/15/2011  . Kidney calculi 09/15/2011  . Acute pancreatitis 09/14/2011  . Cholelithiasis 09/14/2011  . Chronic back pain   . Sleep apnea     STOP BANG 5  . TIA (transient ischemic attack)   . CAD (coronary artery disease)   . Stented coronary artery approx 2004  . Stroke    Past Surgical History:  Past Surgical History  Procedure Laterality Date  . Back surgery      x 4   . Cholecystectomy  09/25/2011    Procedure: LAPAROSCOPIC CHOLECYSTECTOMY;  Surgeon: Fabio Bering, MD;  Location: [facility];  Service: General;  Laterality: N/A;    Subjective Symptoms/Limitations Symptoms: S: My arm was so sore Sunday from what we did on Friday. I took the day off and did nothing.  Pain Assessment Currently in Pain?: No/denies  Precautions/Restrictions  Precautions Precautions: Fall   Assessment Sensation/Coordination/Edema Coordination 9 Hole Peg Test: 7'58" (last progress note: 9'51")  Additional Assessments LUE AROM (degrees) LUE Overall AROM Comments: assessed in seated Left Shoulder Flexion: 75 Degrees (last ) Left Shoulder ABduction: 75 Degrees (last progress note: 75) Left Shoulder Internal Rotation: 80 Degrees (last progress note: 80) Left Shoulder External Rotation: 10 Degrees (last progress note: 0 degrees) Left Forearm Pronation: 90 Degrees (last progress note: 90) Left Forearm Supination: 20 Degrees (last progress note: 20) Left Wrist Extension: 50 Degrees (last progress note:  50) Left Wrist Flexion: 40 Degrees (last progress note: 38) LUE Strength LUE Overall Strength Comments: assessed in seated Left Shoulder Flexion: 2/5 Left Shoulder ABduction: 2/5 Left Shoulder Internal Rotation: 3+/5 (last progress note: 2/5) Left Shoulder External Rotation: 2/5 Left Elbow Flexion: 4/5 Left Elbow Extension: 4/5 Left Forearm Pronation: 4/5 Left Forearm Supination: 3/5 Left Wrist Flexion: 4/5 Left Wrist Extension: 4/5 LUE Tone LUE Tone Comments: Brunnstrom stage IV-V in arm and hand (stage III)     Exercise/Treatments Seated Other Seated Exercises: nu-step using UE only. Level 1 .22 miles 8' to stretch BUE shoulders and increase endurance and strength.       Occupational Therapy Assessment and Plan OT Assessment and Plan Clinical Impression Statement: A: See MD note for progress. patient has made improvement with 9 hole peg test and pinch strength.  OT Plan: P: cont. therapy for 2 more visit to review HEP and D/C on last visit.    Goals Short Term Goals Time to Complete Short Term Goals: 6 weeks Short Term Goal 1: Patient will be educated on HEP. Short Term Goal 2: Patient will increase AROM in his left shoulder to 75% for increased ablility to reach for objects when completing functional activiites.  Short Term Goal 2 Progress: Partly met Short Term Goal 3: Patient will increase left grip strength by 10 pounds and pinch strength by 5 pounds for increased ability to open containers when working on his cars. Short Term Goal 4: Patient will complete Nine Hole Peg Test in standardized fashion. Short Term Goal 5: Patient will attend to his left  side 75% of the time when completing functional activities. Additional Short Term Goals?: Yes Short Term Goal 6: Patient will dress himself and bathe himself with min pa. Short Term Goal 7: Patient will decrease edema in left hand to minimal as he increases his functional use of his left hand.  Short Term Goal 8: Patient  will use his left hand as a gross assist Ily with all daily activities. Short Term Goal 8 Progress: Met Short Term Goal 9: Patient will be able to reach forward and grasp his toothbrush with his left hand with minimal facilitation at his left shoulder.  Long Term Goals Time to Complete Long Term Goals: 12 weeks Long Term Goal 1: Patient will return to [facility] host activities and be able to feed himself a biscuit using his left hand. Long Term Goal 1 Progress: Met Long Term Goal 2: Patient will increase AROM in his left shoulder to 90% for increased ablility to reach for objects when completing functional activiites.  Long Term Goal 2 Progress: Not met Long Term Goal 3: Patient will increase left grip strength by 40 pounds and pich strength by 10  pounds for increased ability to open containers when working on his cars. Long Term Goal 3 Progress: Partly met Long Term Goal 4: Patient will improve fine motor coordination in order to complete Nine Hole Peg Test in 6' or less. Long Term Goal 5: Patient will attend to his left side 95% of the time when completing functional activities. Additional Long Term Goals?: Yes Long Term Goal 6: Patient will dress himself and bathe himself independently. Long Term Goal 6 Progress: Partly met Long Term Goal 7: Patient will improve to Brunnstrom stage V-VI in his left upper extremity in order to use his left arm as an active assist with all daily activiites.  Long Term Goal 7 Progress: Not met Long Term Goal 8: Patient will increase coordination by decreasing completion time with Nine Hole Peg Test to less than 5'. Long Term Goal 8 Progress: Partly met Long Term Goal 9: Patient will improve proximal shoulder strength from poor to fair + for increased independence with washing his truck.  Long Term Goal 9 Progress: Met  Problem List Patient Active Problem List   Diagnosis Date Noted  . Muscle weakness (generalized) 05/31/2012  . Difficulty in walking  05/31/2012  . Lack of coordination 05/31/2012  . Left-sided neglect 05/31/2012  . Cytotoxic cerebral edema 03/19/2012  . Aortic thromboembolism 03/19/2012  . Stroke, acute, embolic, right 03/18/2012  . Seizure 03/18/2012  . Stented coronary artery   . Hemiplegia, unspecified, affecting nondominant side 03/17/2012  . Atrophy of right kidney 09/15/2011  . Kidney calculi 09/15/2011  . Acute pancreatitis 09/14/2011  . Cholelithiasis 09/14/2011  . Hyperglycemia 09/14/2011  . Current every day smoker 09/14/2011    End of Session Activity Tolerance: Patient tolerated treatment well General Behavior During Therapy: [facility] for tasks assessed/performed Cognition: WFL for tasks performed OT Plan of Care Consulted and Agree with Plan of Care: Patient  GO Functional Assessment Tool Used: FAQ 52/72= 72.2% independent 27.8% impaired Functional Limitation: Self care Self Care Current Status ([id number]): At least 20 percent but less than 40 percent impaired, limited or restricted Self Care Goal Status (X3244): At least 1 percent but less than 20 percent impaired, limited or restricted  Limmie Patricia, OTR/L,CBIS   09/12/2012, 4:13 PM  Physician Documentation Your signature is required to indicate approval of the treatment plan as stated above.  Please sign and  either send electronically or make a copy of this report for your files and return this physician signed original.  Please mark one 1.__approve of plan  2. ___approve of plan with the following conditions.   ______________________________                                                          _____________________ Physician Signature                                                                                                             Date

## 2012-09-14 ENCOUNTER — Ambulatory Visit (HOSPITAL_COMMUNITY)
Admission: RE | Admit: 2012-09-14 | Discharge: 2012-09-14 | Disposition: A | Discharge status: Home / Self Care | Payer: Medicare Other | Source: Ambulatory Visit | Attending: Physical Medicine & Rehabilitation | Admitting: Physical Medicine & Rehabilitation

## 2012-09-14 DIAGNOSIS — R279 Unspecified lack of coordination: Secondary | ICD-10-CM

## 2012-09-14 DIAGNOSIS — R414 Neurologic neglect syndrome: Secondary | ICD-10-CM

## 2012-09-14 NOTE — Progress Notes (Signed)
Occupational Therapy Treatment Patient Details  Name: Shaun Pugh MRN: [id number] Date of Birth: Sep 14, 1950  Today's Date: 09/14/2012 Time: 4098-1191 OT Time Calculation (min): 48 min Therex 4782-9562 48'  Visit#: 43 of 45  Re-eval: 09/16/12    Authorization: Medicare Blue   Authorization Time Period: before 52 visit  Authorization Visit#: 43 of 52  Subjective Symptoms/Limitations Symptoms: S: My arm has been stretched and pulled to the max lately. It's tired. Pain Assessment Currently in Pain?: No/denies  Precautions/Restrictions  Precautions Precautions: Fall  Exercise/Treatments Seated Other Seated Exercises: nu-step using UE only. Level 1 .22 miles 8' to stretch BUE shoulders and increase endurance and strength.    Activities of Daily Living Activities of Daily Living: GRASP HEP Theraputty -cutting, finger strength. Waiter - ball, cup. Advanced waiter  Occupational Therapy Assessment and Plan OT Assessment and Plan Clinical Impression Statement: A: Continued to review GRASP HEP. Patient had difficulty with advanced waiter. Pt did not perform activity safely and had difficulty performing task with proper form. Activity will be eliminated.  OT Plan: P: Cont. reviewing activities.    Goals Short Term Goals Time to Complete Short Term Goals: 6 weeks Short Term Goal 1: Patient will be educated on HEP. Short Term Goal 2: Patient will increase AROM in his left shoulder to 75% for increased ablility to reach for objects when completing functional activiites.  Short Term Goal 3: Patient will increase left grip strength by 10 pounds and pinch strength by 5 pounds for increased ability to open containers when working on his cars. Short Term Goal 4: Patient will complete Nine Hole Peg Test in standardized fashion. Short Term Goal 5: Patient will attend to his left side 75% of the time when completing functional activities. Additional Short Term Goals?: Yes Short Term Goal 6:  Patient will dress himself and bathe himself with min pa. Short Term Goal 7: Patient will decrease edema in left hand to minimal as he increases his functional use of his left hand.  Short Term Goal 8: Patient will use his left hand as a gross assist Ily with all daily activities. Short Term Goal 9: Patient will be able to reach forward and grasp his toothbrush with his left hand with minimal facilitation at his left shoulder.  Long Term Goals Time to Complete Long Term Goals: 12 weeks Long Term Goal 1: Patient will return to [facility] host activities and be able to feed himself a biscuit using his left hand. Long Term Goal 2: Patient will increase AROM in his left shoulder to 90% for increased ablility to reach for objects when completing functional activiites.  Long Term Goal 3: Patient will increase left grip strength by 40 pounds and pich strength by 10  pounds for increased ability to open containers when working on his cars. Long Term Goal 4: Patient will improve fine motor coordination in order to complete Nine Hole Peg Test in 110' or less. Long Term Goal 5: Patient will attend to his left side 95% of the time when completing functional activities. Additional Long Term Goals?: Yes Long Term Goal 6: Patient will dress himself and bathe himself independently. Long Term Goal 7: Patient will improve to Brunnstrom stage V-VI in his left upper extremity in order to use his left arm as an active assist with all daily activiites.  Long Term Goal 8: Patient will increase coordination by decreasing completion time with Nine Hole Peg Test to less than 5'. Long Term Goal 9: Patient will improve proximal  shoulder strength from poor to fair + for increased independence with washing his truck.   Problem List Patient Active Problem List   Diagnosis Date Noted  . Muscle weakness (generalized) 05/31/2012  . Difficulty in walking 05/31/2012  . Lack of coordination 05/31/2012  . Left-sided neglect  05/31/2012  . Cytotoxic cerebral edema 03/19/2012  . Aortic thromboembolism 03/19/2012  . Stroke, acute, embolic, right 03/18/2012  . Seizure 03/18/2012  . Stented coronary artery   . Hemiplegia, unspecified, affecting nondominant side 03/17/2012  . Atrophy of right kidney 09/15/2011  . Kidney calculi 09/15/2011  . Acute pancreatitis 09/14/2011  . Cholelithiasis 09/14/2011  . Hyperglycemia 09/14/2011  . Current every day smoker 09/14/2011    End of Session Activity Tolerance: Patient tolerated treatment well General Behavior During Therapy: [facility] for tasks assessed/performed Cognition: WFL for tasks performed OT Plan of Care OT Home Exercise Plan: GRASP  Consulted and Agree with Plan of Care: Patient   Limmie Patricia, OTR/L,CBIS   09/14/2012, 3:04 PM

## 2012-09-16 ENCOUNTER — Ambulatory Visit (HOSPITAL_COMMUNITY)
Admission: RE | Admit: 2012-09-16 | Discharge: 2012-09-16 | Disposition: A | Discharge status: Home / Self Care | Payer: Medicare Other | Source: Ambulatory Visit | Attending: Physical Medicine & Rehabilitation | Admitting: Physical Medicine & Rehabilitation

## 2012-09-16 DIAGNOSIS — R279 Unspecified lack of coordination: Secondary | ICD-10-CM

## 2012-09-16 DIAGNOSIS — R414 Neurologic neglect syndrome: Secondary | ICD-10-CM

## 2012-09-16 NOTE — Progress Notes (Signed)
Occupational Therapy Treatment Patient Details  Name: Shaun Pugh MRN: [id number] Date of Birth: [birth year]  Today's Date: 09/16/2012 Time: 0920-1015 OT Time Calculation (min): 55 min  Visit#: 44 of 45  Re-eval: 09/16/12    Authorization: Medicare Blue   Authorization Time Period: before 52 visit  Authorization Visit#: 44 of 52  Subjective Symptoms/Limitations Symptoms: S: I was up every hour last night. It feels like I built a pool. Pain Assessment Currently in Pain?: No/denies  Precautions/Restrictions  Precautions Precautions: Fall  Exercise/Treatments Seated Other Seated Exercises: nu-step using UE only. Level 1 .22 miles 8' to stretch BUE shoulders and increase endurance and strength.    Activities of Daily Living Activities of Daily Living: GRASP HEP Pouring water, ball rolling with partner and without, folding towels, clothespins, stacking blocks, opening/closing jar, drying off  Occupational Therapy Assessment and Plan OT Assessment and Plan Clinical Impression Statement: A: Finished reviewing GRASP HEP. Patient to be D/C'd from therapy this date. OT Plan: P: D/C from therapy.   Goals Short Term Goals Time to Complete Short Term Goals: 6 weeks Short Term Goal 1: Patient will be educated on HEP. Short Term Goal 2: Patient will increase AROM in his left shoulder to 75% for increased ablility to reach for objects when completing functional activiites.  Short Term Goal 3: Patient will increase left grip strength by 10 pounds and pinch strength by 5 pounds for increased ability to open containers when working on his cars. Short Term Goal 4: Patient will complete Nine Hole Peg Test in standardized fashion. Short Term Goal 5: Patient will attend to his left side 75% of the time when completing functional activities. Additional Short Term Goals?: Yes Short Term Goal 6: Patient will dress himself and bathe himself with min pa. Short Term Goal 7: Patient will decrease  edema in left hand to minimal as he increases his functional use of his left hand.  Short Term Goal 8: Patient will use his left hand as a gross assist Ily with all daily activities. Short Term Goal 9: Patient will be able to reach forward and grasp his toothbrush with his left hand with minimal facilitation at his left shoulder.  Long Term Goals Time to Complete Long Term Goals: 12 weeks Long Term Goal 1: Patient will return to [facility] host activities and be able to feed himself a biscuit using his left hand. Long Term Goal 2: Patient will increase AROM in his left shoulder to 90% for increased ablility to reach for objects when completing functional activiites.  Long Term Goal 3: Patient will increase left grip strength by 40 pounds and pich strength by 10  pounds for increased ability to open containers when working on his cars. Long Term Goal 4: Patient will improve fine motor coordination in order to complete Nine Hole Peg Test in 66' or less. Long Term Goal 5: Patient will attend to his left side 95% of the time when completing functional activities. Additional Long Term Goals?: Yes Long Term Goal 6: Patient will dress himself and bathe himself independently. Long Term Goal 7: Patient will improve to Brunnstrom stage V-VI in his left upper extremity in order to use his left arm as an active assist with all daily activiites.  Long Term Goal 8: Patient will increase coordination by decreasing completion time with Nine Hole Peg Test to less than 5'. Long Term Goal 9: Patient will improve proximal shoulder strength from poor to fair + for increased independence with washing his truck.  Problem List Patient Active Problem List   Diagnosis Date Noted  . Muscle weakness (generalized) 05/31/2012  . Difficulty in walking 05/31/2012  . Lack of coordination 05/31/2012  . Left-sided neglect 05/31/2012  . Cytotoxic cerebral edema 03/19/2012  . Aortic thromboembolism 03/19/2012  . Stroke, acute,  embolic, right 03/18/2012  . Seizure 03/18/2012  . Stented coronary artery   . Hemiplegia, unspecified, affecting nondominant side 03/17/2012  . Atrophy of right kidney 09/15/2011  . Kidney calculi 09/15/2011  . Acute pancreatitis 09/14/2011  . Cholelithiasis 09/14/2011  . Hyperglycemia 09/14/2011  . Current every day smoker 09/14/2011    End of Session Activity Tolerance: Patient tolerated treatment well General Behavior During Therapy: [facility] for tasks assessed/performed Cognition: WFL for tasks performed OT Plan of Care OT Home Exercise Plan: GRASP  OT Patient Instructions: handout Consulted and Agree with Plan of Care: Patient;Family member/caregiver Family Member Consulted: wife   Limmie Patricia, OTR/L,CBIS   09/16/2012, 11:37 AM

## 2012-09-29 ENCOUNTER — Encounter: Payer: Self-pay | Admitting: Physical Medicine & Rehabilitation

## 2012-09-29 ENCOUNTER — Encounter: Payer: Medicare Other | Attending: Physical Medicine & Rehabilitation

## 2012-09-29 ENCOUNTER — Ambulatory Visit (HOSPITAL_BASED_OUTPATIENT_CLINIC_OR_DEPARTMENT_OTHER): Payer: Medicare Other | Admitting: Physical Medicine & Rehabilitation

## 2012-09-29 VITALS — BP 172/93 | HR 98 | Resp 16 | Ht 73.0 in | Wt 259.0 lb

## 2012-09-29 DIAGNOSIS — G8110 Spastic hemiplegia affecting unspecified side: Secondary | ICD-10-CM

## 2012-09-29 DIAGNOSIS — I69919 Unspecified symptoms and signs involving cognitive functions following unspecified cerebrovascular disease: Secondary | ICD-10-CM | POA: Insufficient documentation

## 2012-09-29 DIAGNOSIS — I2510 Atherosclerotic heart disease of native coronary artery without angina pectoris: Secondary | ICD-10-CM | POA: Insufficient documentation

## 2012-09-29 DIAGNOSIS — G4730 Sleep apnea, unspecified: Secondary | ICD-10-CM | POA: Insufficient documentation

## 2012-09-29 DIAGNOSIS — I69959 Hemiplegia and hemiparesis following unspecified cerebrovascular disease affecting unspecified side: Secondary | ICD-10-CM | POA: Insufficient documentation

## 2012-09-29 DIAGNOSIS — R339 Retention of urine, unspecified: Secondary | ICD-10-CM | POA: Insufficient documentation

## 2012-09-29 NOTE — Progress Notes (Signed)
Botox Injection for spasticity using needle EMG guidance  Dilution: 50 Units/ml Indication: Severe spasticity which interferes with ADL,mobility and/or  hygiene and is unresponsive to medication management and other conservative care Informed consent was obtained after describing risks and benefits of the procedure with the patient. This includes bleeding, bruising, infection, excessive weakness, or medication side effects. A REMS form is on file and signed. Needle: 26 gauge 50mm  needle electrode Number of units per muscle Pectoralis125 Subscapularis 25 units  Pronator teres 50 units  All injections were done after obtaining appropriate EMG activity and after negative drawback for blood. The patient tolerated the procedure well. Post procedure instructions were given. A followup appointment was made.

## 2012-09-29 NOTE — Patient Instructions (Signed)

## 2012-09-30 ENCOUNTER — Ambulatory Visit: Payer: Medicare Other | Admitting: Physical Medicine & Rehabilitation

## 2012-10-21 ENCOUNTER — Encounter: Payer: Self-pay | Admitting: Physical Medicine & Rehabilitation

## 2012-10-21 ENCOUNTER — Encounter: Payer: Medicare Other | Attending: Physical Medicine & Rehabilitation

## 2012-10-21 ENCOUNTER — Ambulatory Visit (HOSPITAL_BASED_OUTPATIENT_CLINIC_OR_DEPARTMENT_OTHER): Payer: Medicare Other | Admitting: Physical Medicine & Rehabilitation

## 2012-10-21 VITALS — BP 155/82 | HR 84 | Resp 14 | Ht 73.0 in | Wt 258.8 lb

## 2012-10-21 DIAGNOSIS — G4730 Sleep apnea, unspecified: Secondary | ICD-10-CM | POA: Insufficient documentation

## 2012-10-21 DIAGNOSIS — I69919 Unspecified symptoms and signs involving cognitive functions following unspecified cerebrovascular disease: Secondary | ICD-10-CM | POA: Insufficient documentation

## 2012-10-21 DIAGNOSIS — M79609 Pain in unspecified limb: Secondary | ICD-10-CM

## 2012-10-21 DIAGNOSIS — R339 Retention of urine, unspecified: Secondary | ICD-10-CM | POA: Insufficient documentation

## 2012-10-21 DIAGNOSIS — I69959 Hemiplegia and hemiparesis following unspecified cerebrovascular disease affecting unspecified side: Secondary | ICD-10-CM | POA: Insufficient documentation

## 2012-10-21 DIAGNOSIS — G8929 Other chronic pain: Secondary | ICD-10-CM

## 2012-10-21 DIAGNOSIS — I2510 Atherosclerotic heart disease of native coronary artery without angina pectoris: Secondary | ICD-10-CM | POA: Insufficient documentation

## 2012-10-21 NOTE — Progress Notes (Signed)
Subjective:    Patient ID: Shaun Pugh, male    DOB: 1951/03/08, 62 y.o.   MRN: [id number] history of coronary artery disease and TIA with left visual field  deficits who was admitted March 17, 2012, with headache, left-sided  weakness, numbness, and slurred speech. He was treated with tPA. CTA  head and neck showed hypodensity at right superior gyrus, ulcerated  plaque at right ICA with 60% ICA stenosis and suspicion of 10-mm focal  thrombus proximal aortic arch. The patient developed combativeness with  agitation as well as focal motor seizures of left upper extremity on  March 18, 2012. He was treated with Ativan and was started on Keppra  for treatment. Followup CT showed extensive area of low density  affecting cortical and subcortical right hemisphere in the MCA  distribution. 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 He was placed on aspirin and  no other anticoagulation due to large stroke and risk for bleeding    HPI Done with PT/OT No falls, Started aquatic therapy on his own.  No significant improvement in left shoulder tightness or pain after Botox. L thumb PIP pain, no trauma Pain Inventory Average Pain 0 Pain Right Now 0 My pain is no pain  In the last 24 hours, has pain interfered with the following? General activity 0 Relation with others 0 Enjoyment of life 0 What TIME of day is your pain at its worst? no pain Sleep (in general) Good  Pain is worse with: no pain Pain improves with: no pain Relief from Meds: no pain  Mobility walk without assistance ability to climb steps?  yes do you drive?  no  Function retired  Neuro/Psych No problems in this area  Prior Studies Any changes since last visit?  no  Physicians involved in your care Any changes since last visit?  no   Family History  Problem Relation Age of Onset  . Anesthesia problems Neg Hx   . Hypotension Neg Hx   .  Malignant hyperthermia Neg Hx   . Pseudochol deficiency Neg Hx   . Congestive Heart Failure Mother   . Diabetes Father   . Heart attack Father    History   Social History  . Marital Status: Married    Spouse Name: N/A    Number of Children: N/A  . Years of Education: N/A   Social History Main Topics  . Smoking status: Former Smoker -- 1.00 packs/day for 45 years    Types: Cigarettes    Quit date: 03/18/2012  . Smokeless tobacco: Never Used  . Alcohol Use: No  . Drug Use: No  . Sexually Active: Yes    Birth Control/ Protection: None   Other Topics Concern  . None   Social History Narrative  . None   Past Surgical History  Procedure Laterality Date  . Back surgery      x 4   . Cholecystectomy  09/25/2011    Procedure: LAPAROSCOPIC CHOLECYSTECTOMY;  Surgeon: Fabio Bering, MD;  Location: [facility];  Service: General;  Laterality: N/A;   Past Medical History  Diagnosis Date  . High cholesterol   . Atrophy of right kidney 09/15/2011  . Kidney calculi 09/15/2011  . Acute pancreatitis 09/14/2011  . Cholelithiasis 09/14/2011  . Chronic back pain   . Sleep apnea     STOP BANG 5  . TIA (transient ischemic attack)   . CAD (coronary artery disease)   .  Stented coronary artery approx 2004  . Stroke    BP 155/82  Pulse 84  Resp 14  Ht 6\' 1"  (1.854 m)  Wt 258 lb 12.8 oz (117.391 kg)  BMI 34.15 kg/m2  SpO2 96%    Review of Systems  All other systems reviewed and are negative.       Objective:   Physical Exam  Head: Normocephalic and atraumatic.  Eyes: Conjunctivae and EOM are normal. Pupils are equal, round, and reactive to light. Visual fields intact Neck: Normal range of motion.  Musculoskeletal:  Left shoulder: He exhibits decreased range of motion, pain and spasm.  Increased pectoralis tone  Neurological: He is alert and oriented to person, place, and time. He has normal reflexes. He displays no atrophy. No cranial nerve deficit or sensory deficit. He exhibits  abnormal muscle tone. Coordination and gait abnormal.  No left neglect on visual confrontation testing.  Decreased fine motor left upper extremity Pain with range of motion  left shoulder and left thumb  Swelling of dorsum left hand No hypersensitivity to touch  Skin: Skin is warm and dry.  Psychiatric: He has a normal mood and affect.   Decreased passive external rotation of the left upper extremity. Also problems with resisted supination with increased  Left pronator tone      Assessment & Plan:  1. 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  Focal motor seizures documented 7 months ago at onset of CVA, no recurrence. Seizure free off Keppra  Graduated return to driving instructions were provided. It is recommended that the patient first drives with another licensed driver in an empty parking lot. If the patient does well with this, and they can drive on a quiet street with the licensed driver. If the patient does well with this they can drive on a busy street with a licensed driver. If the patient does well with this, the next time out they can go by himself. For the first month after resuming driving, I recommend no nighttime or Interstate driving.

## 2012-10-21 NOTE — Patient Instructions (Signed)

## 2012-11-03 ENCOUNTER — Ambulatory Visit (HOSPITAL_COMMUNITY)
Admission: RE | Admit: 2012-11-03 | Discharge: 2012-11-03 | Disposition: A | Discharge status: Home / Self Care | Payer: Medicare Other | Source: Ambulatory Visit | Attending: Physical Medicine & Rehabilitation | Admitting: Physical Medicine & Rehabilitation

## 2012-11-03 DIAGNOSIS — M79609 Pain in unspecified limb: Secondary | ICD-10-CM | POA: Insufficient documentation

## 2012-11-03 DIAGNOSIS — M899 Disorder of bone, unspecified: Secondary | ICD-10-CM | POA: Insufficient documentation

## 2012-11-03 DIAGNOSIS — G8929 Other chronic pain: Secondary | ICD-10-CM

## 2012-11-15 ENCOUNTER — Encounter: Payer: Self-pay | Admitting: Nurse Practitioner

## 2012-11-15 ENCOUNTER — Ambulatory Visit (INDEPENDENT_AMBULATORY_CARE_PROVIDER_SITE_OTHER): Payer: Medicare Other | Admitting: Nurse Practitioner

## 2012-11-15 VITALS — BP 163/89 | HR 89 | Ht 72.0 in | Wt 259.0 lb

## 2012-11-15 DIAGNOSIS — I6340 Cerebral infarction due to embolism of unspecified cerebral artery: Secondary | ICD-10-CM

## 2012-11-15 DIAGNOSIS — I639 Cerebral infarction, unspecified: Secondary | ICD-10-CM

## 2012-11-15 DIAGNOSIS — G8190 Hemiplegia, unspecified affecting unspecified side: Secondary | ICD-10-CM

## 2012-11-15 DIAGNOSIS — R262 Difficulty in walking, not elsewhere classified: Secondary | ICD-10-CM

## 2012-11-15 NOTE — Progress Notes (Signed)
HPI:  Shaun Pugh is a 14 year  Caucasian male who is seen for followup. Hospital admission for stroke on 03/17/12. He presented with sudden onset of slurred speech and left-sided weakness. He was treated with IV TPA with partial improvement of his deficits. He remained stable but had residual left-sided weakness. CT angiogram showed 60% proximal right ICA stenosis with ulceration as well as a small 10 mm  possible thrombus in the proximal aortic arch. This  was felt to be the etiology of his stroke. He was placed on aspirin and Plavix. CT head showed a low-density in the right frontal region. His  course was complicated by developing focal seizures for which Keppra.was started.  EEG showed no epileptiform activity but was abnormal secondary to increased voltage and sharp activity over the left hemisphere with phase reversal at [facility]. Marland Kitchen He also developed left lower lobe pneumonia which was treated with antibiotics. Transthoracic echo showed normal ejection fraction with moderate left ventricular hypertrophy. Vascular is factors identified included hypertension, hyperlipidemia, obesity  and coronary artery disease. He was transferred to inpatient rehabilitation and in 4 weeks  showed gradual improvement. He is now able to ambulate with a cane and short distances without it. He still has significant weakness in his left hand and he can bend his fingers but cannot use it the to perform most of activities. He received Botox without benefit. Remains on Plavix  and is tolerating  well without significant bleeding, bruising or other side effects. His outpatient therapies have concluded. He stopped his seizure medication in March due to irritability. He did not call into the office he has not been on any medication . Wife also states that he snores and stops breathing. He has never been evaluated for sleep apnea  ROS: Shortness of breath, easy bruising   Physical Exam General: well developed, obese male (BMI 35) seated, in  no evident distress Head: head normocephalic and atraumatic. Oropharynx benign Neck: supple with no carotid  bruits Cardiovascular: regular rate and rhythm, no murmurs  Neurologic Exam Mental Status: Awake and fully alert. Oriented to place and time. Follows all commands. Speech and language normal.   Cranial Nerves: Fundoscopic exam reveals sharp disc margins. Pupils equal, briskly reactive to light. Extraocular movements full without nystagmus. Visual fields full to confrontation. Hearing intact and symmetric to finger snap. Facial sensation intact. Face, tongue, palate move normally and symmetrically. Neck flexion and extension normal.  Motor: Spastic left hemiparesis with 3/5 strength in the upper extremity proximally and 2/5 distally. Left lower extremity 4/5 with weakness of ankle dorsiflexion and hip flexors. Right side normal  Sensory.: intact to touch and pinprick and vibratory.  Coordination: Impaired on the left normal on the right  Gait and Station: Arises from chair without difficulty. Hemiplegia gait with circumduction of the left leg.   Reflexes: Brisker on the left , 2+ and symmetric on the right. Toes downgoing.     ASSESSMENT: Right MCA branch infarct November 2013 likely from right internal carotid artery stenosis or aortic  arch thrombosis, treated with IV TPA with modest improvement in symptoms Developed seizure disorder and complications of pneumonia while hospitalized. Patient stopped Keppra 3 months ago and did not notify us due to irritability on the drug Vascular risk factors of carotid stenosis, aortic arch thrombosis, hypertension, hyperlipidemia, coronary artery disease, suspected sleep apnea Neck circumference 19 inches BMI greater than 35     PLAN: Depakote 1 tab daily for 4 days then 2 daily for seizure  disorder samples given will call for refill Continue Plavix for secondary stroke prevention  strict control of hypertension with blood pressure below  130/90 LDL cholesterol below 100 Continue home exercise program Will obtain sleep study Followup in 3 months Nilda Riggs, GNP-BC APRN

## 2012-11-15 NOTE — Patient Instructions (Addendum)
Depakote 1 tab daily for 4 days then 2 daily Continue Plavix for secondary stroke prevention strict control of hypertension with blood pressure below 130/90 LDL cholesterol below 100 Continue home exercise program Will obtain sleep study

## 2012-11-18 ENCOUNTER — Encounter: Payer: Self-pay | Admitting: Physical Medicine & Rehabilitation

## 2012-11-18 ENCOUNTER — Encounter: Payer: Medicare Other | Attending: Physical Medicine & Rehabilitation

## 2012-11-18 ENCOUNTER — Telehealth: Payer: Self-pay | Admitting: Neurology

## 2012-11-18 ENCOUNTER — Ambulatory Visit (HOSPITAL_BASED_OUTPATIENT_CLINIC_OR_DEPARTMENT_OTHER): Payer: Medicare Other | Admitting: Physical Medicine & Rehabilitation

## 2012-11-18 VITALS — BP 165/87 | HR 80 | Resp 14 | Ht 73.0 in | Wt 259.6 lb

## 2012-11-18 DIAGNOSIS — G8190 Hemiplegia, unspecified affecting unspecified side: Secondary | ICD-10-CM

## 2012-11-18 DIAGNOSIS — R339 Retention of urine, unspecified: Secondary | ICD-10-CM | POA: Insufficient documentation

## 2012-11-18 DIAGNOSIS — I2510 Atherosclerotic heart disease of native coronary artery without angina pectoris: Secondary | ICD-10-CM | POA: Insufficient documentation

## 2012-11-18 DIAGNOSIS — G4733 Obstructive sleep apnea (adult) (pediatric): Secondary | ICD-10-CM

## 2012-11-18 DIAGNOSIS — I69959 Hemiplegia and hemiparesis following unspecified cerebrovascular disease affecting unspecified side: Secondary | ICD-10-CM | POA: Insufficient documentation

## 2012-11-18 DIAGNOSIS — I69919 Unspecified symptoms and signs involving cognitive functions following unspecified cerebrovascular disease: Secondary | ICD-10-CM | POA: Insufficient documentation

## 2012-11-18 DIAGNOSIS — G4730 Sleep apnea, unspecified: Secondary | ICD-10-CM | POA: Insufficient documentation

## 2012-11-18 MED ORDER — DICLOFENAC SODIUM 1 % TD GEL: 2.0000 g | Freq: Four times a day (QID) | TRANSDERMAL | Status: DC

## 2012-11-18 NOTE — Patient Instructions (Signed)
I recommend no nighttime or Interstate driving  Diclofenac gel is for the left thumb

## 2012-11-18 NOTE — Progress Notes (Signed)
Subjective:    Patient ID: Shaun Pugh, male    DOB: 1950/12/09, 62 y.o.   MRN: [id number] history of coronary artery disease and TIA with left visual field  deficits who was admitted March 17, 2012, with headache, left-sided  weakness, numbness, and slurred speech. He was treated with tPA. CTA  head and neck showed hypodensity at right superior gyrus, ulcerated  plaque at right ICA with 60% ICA stenosis and suspicion of 10-mm focal  thrombus proximal aortic arch. The patient developed combativeness with  agitation as well as focal motor seizures of left upper extremity on  March 18, 2012. He was treated with Ativan and was started on Keppra  for treatment. Followup CT showed extensive area of low density  affecting cortical and subcortical right hemisphere in the MCA  distribution. 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 He was placed on aspirin and  no other anticoagulation due to large stroke and risk for bleeding   HPI No falls at home Has written short distances after being cleared. No nighttime or Interstate driving thus far. Goes to [facility] to work out on a regular basis Followed up with neurology  Pain Inventory Average Pain 0 Pain Right Now 0 My pain is no pain  In the last 24 hours, has pain interfered with the following? General activity 0 Relation with others 0 Enjoyment of life 0 What TIME of day is your pain at its worst? no pain Sleep (in general) Good  Pain is worse with: no pain Pain improves with: no pain Relief from Meds: no pain  Mobility walk without assistance  Function retired  Neuro/Psych No problems in this area  Prior Studies Any changes since last visit?  no  Physicians involved in your care Any changes since last visit?  no   Family History  Problem Relation Age of Onset  . Anesthesia problems Neg Hx   . Hypotension Neg Hx   . Malignant hyperthermia Neg Hx   .  Pseudochol deficiency Neg Hx   . Congestive Heart Failure Mother   . Diabetes Father   . Heart attack Father    History   Social History  . Marital Status: Married    Spouse Name: N/A    Number of Children: N/A  . Years of Education: N/A   Social History Main Topics  . Smoking status: Former Smoker -- 1.00 packs/day for 45 years    Types: Cigarettes    Quit date: 03/18/2012  . Smokeless tobacco: Never Used  . Alcohol Use: No  . Drug Use: No  . Sexually Active: Yes    Birth Control/ Protection: None   Other Topics Concern  . None   Social History Narrative  . None   Past Surgical History  Procedure Laterality Date  . Back surgery      x 4   . Cholecystectomy  09/25/2011    Procedure: LAPAROSCOPIC CHOLECYSTECTOMY;  Surgeon: Fabio Bering, MD;  Location: [facility];  Service: General;  Laterality: N/A;   Past Medical History  Diagnosis Date  . High cholesterol   . Atrophy of right kidney 09/15/2011  . Kidney calculi 09/15/2011  . Acute pancreatitis 09/14/2011  . Cholelithiasis 09/14/2011  . Chronic back pain   . Sleep apnea     STOP BANG 5  . TIA (transient ischemic attack)   . CAD (coronary artery disease)   . Stented coronary artery approx 2004  . Stroke   .  Seizures    BP 165/87  Pulse 80  Resp 14  Ht 6\' 1"  (1.854 m)  Wt 259 lb 9.6 oz (117.754 kg)  BMI 34.26 kg/m2  SpO2 97%   Review of Systems  All other systems reviewed and are negative.       Objective:   Physical Exam  Head: Normocephalic and atraumatic.  Eyes: Conjunctivae and EOM are normal. Pupils are equal, round, and reactive to light. Visual fields intact  Neck: Normal range of motion.  Musculoskeletal:  Left shoulder: He exhibits decreased range of motion, pain and spasm.  Increased pectoralis tone  Neurological: He is alert and oriented to person, place, and time. He has normal reflexes. He displays no atrophy. No cranial nerve deficit or sensory deficit. He exhibits abnormal muscle tone.  Coordination and gait abnormal.  No left neglect on visual confrontation testing.  Decreased fine motor left upper extremity Pain with range of motion left shoulder and left thumb  Swelling of dorsum left hand No hypersensitivity to touch  Skin: Skin is warm and dry.  Psychiatric: He has a normal mood and affect.  Decreased passive external rotation of the left upper extremity. Also problems with resisted supination with increased Left pronator tone  Ambulates with occasional foot drag on the left side.     Assessment & Plan:  1. 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 Focal motor seizures documented 7 months ago at onset of CVA, no recurrence. Seizure free off Keppra I recommend no nighttime or Interstate driving Followup in 3 months. At that point we will decide whether to lift nighttime and Interstate driving restrictions

## 2012-11-18 NOTE — Telephone Encounter (Signed)
. °  Shaun Angel, NP is referring Shaun Pugh, 62 y.o. y/o male, for the evaluation of sleep apnea.  Wt: 259 lbs. Ht: 72 in. BMI: 35.12  Diagnoses: Stroke - 03/17/2012 Seizure Witnessed Apneas Snoring Hypertension Hyperlipidemia Obesity CAD   Medication List: Current Outpatient Prescriptions  Medication Sig Dispense Refill   clopidogrel (PLAVIX) 75 MG tablet Take 1 tablet (75 mg total) by mouth daily with breakfast.  30 tablet  1   diclofenac sodium (VOLTAREN) 1 % GEL Apply 2 g topically 4 (four) times daily.  3 Tube  1   divalproex (DEPAKOTE ER) 500 MG 24 hr tablet Take 1,000 mg by mouth daily.       hydrochlorothiazide (HYDRODIURIL) 25 MG tablet Take 1 tablet (25 mg total) by mouth daily.  30 tablet  1   potassium chloride (K-DUR,KLOR-CON) 10 MEQ tablet Take 1 tablet (10 mEq total) by mouth 2 (two) times daily.  60 tablet  1   rosuvastatin (CRESTOR) 10 MG tablet Take 10 mg by mouth daily.       No current facility-administered medications for this visit.    Shaun Angel, NP is referring this patient due to reports from spouse that he stops breathing while sleeping and snoring.  Patient has a neck circumference of 19 and is obese with a bmi of 35.  Had a stroke on 03/17/2012 and then developed focal seizures.  Pt has several cardiovascular risk factors such as carotid stenosis, aortic arch thrombosis, hypertension, hyperlipidemia, and coronary artery disease.  Patient needs an attended sleep study to rule out sleep apnea and for secondary stroke prevention.   Insurance:  [employer] - Prior approval is not required

## 2012-11-18 NOTE — Telephone Encounter (Signed)
Placed sleep study order in chart, thx s

## 2012-11-24 ENCOUNTER — Ambulatory Visit (INDEPENDENT_AMBULATORY_CARE_PROVIDER_SITE_OTHER): Payer: Medicare Other | Admitting: Neurology

## 2012-11-24 VITALS — BP 162/79 | HR 76 | Ht 72.0 in | Wt 256.0 lb

## 2012-11-24 DIAGNOSIS — G4733 Obstructive sleep apnea (adult) (pediatric): Secondary | ICD-10-CM

## 2012-11-24 DIAGNOSIS — G479 Sleep disorder, unspecified: Secondary | ICD-10-CM

## 2012-12-02 ENCOUNTER — Telehealth: Payer: Self-pay | Admitting: Neurology

## 2012-12-09 ENCOUNTER — Telehealth: Payer: Self-pay | Admitting: Neurology

## 2012-12-09 NOTE — Telephone Encounter (Signed)
[place]: Please call this patient and set up a new patient consultation with me, since he had a recent sleep study. I would like to go over the test results with him and see if we can talk about treatment options. Please advise him that he does have obstructive sleep apnea and would most likely benefit from CPAP or BiPAP. I would like to meet him and talked to him about his sleep study results as well as his experience with CPAP. He obviously did not sleep much at all while on CPAP. And the baseline portion of the study slept very little. I am copying Darrol Angel on this as well.

## 2012-12-11 NOTE — Telephone Encounter (Signed)
Called patient to discuss sleep study results.  Discussed findings, recommendations and follow up care.  Patient understood well and all questions were answered.   Spoke with patient's spouse and scheduled a follow up appt with Dr. Frances Furbish on 12/20/12 at 9:00 AM to discuss treatment options.  A copy of report will be mailed to patient's home and PCP and referring MD will be notified of results as well.

## 2012-12-13 ENCOUNTER — Encounter: Payer: Self-pay | Admitting: *Deleted

## 2012-12-13 NOTE — Sleep Study (Signed)
See media tab for full report  

## 2012-12-20 ENCOUNTER — Encounter: Payer: Self-pay | Admitting: Neurology

## 2012-12-20 ENCOUNTER — Ambulatory Visit (INDEPENDENT_AMBULATORY_CARE_PROVIDER_SITE_OTHER): Payer: Medicare Other | Admitting: Neurology

## 2012-12-20 VITALS — BP 150/79 | HR 81 | Ht 72.0 in | Wt 264.0 lb

## 2012-12-20 DIAGNOSIS — Z789 Other specified health status: Secondary | ICD-10-CM

## 2012-12-20 DIAGNOSIS — G4733 Obstructive sleep apnea (adult) (pediatric): Secondary | ICD-10-CM

## 2012-12-20 DIAGNOSIS — R0981 Nasal congestion: Secondary | ICD-10-CM

## 2012-12-20 DIAGNOSIS — Z8673 Personal history of transient ischemic attack (TIA), and cerebral infarction without residual deficits: Secondary | ICD-10-CM

## 2012-12-20 DIAGNOSIS — J3489 Other specified disorders of nose and nasal sinuses: Secondary | ICD-10-CM

## 2012-12-20 MED ORDER — FLUTICASONE PROPIONATE 50 MCG/ACT NA SUSP: 2.0000 | Freq: Every day | NASAL | Status: DC

## 2012-12-20 NOTE — Patient Instructions (Addendum)
You have sleep apnea and would benefit from treatment with a CPAP machine. I would like for your to try a machine at home. Use flonase in your nostrils at night to help reduce the nasal swelling. I will see you back soon. Pls let Darrol Angel know, that you have not been taking the depakote and that you have had no further seizures.   Please remember, common seizure triggers are: sleep deprivation, dehydration, overheating, stress, hypoglycemia or skipping meals, certain medications or excessive alcohol use. If you have a prolonged seizure over 2-5 minutes or back to back seizures, have someone call 911 or take you to the nearest emergency room. You cannot drive a car or operate any other machinery or vehicle within 6 months of a seizure. Please do not swim alone or take a tub bath or cook with large quantities of boiling water or oil for safety. Take your medicine for seizure prevention regularly and do not skip doses. Try to get a refill on your antiepileptic medication ahead of time, so you are not at risk of running out.

## 2012-12-20 NOTE — Progress Notes (Signed)
Subjective:    Patient ID: Shaun Pugh is a 62 y.o. male.  HPI  Shaun Foley, MD, PhD [facility] [address] [address] [place], [place] [address]  Dear Shaun Pugh,   I saw your patient, Shaun Pugh, upon your kind request in my clinic today for initial consultation of his sleep disorder, and to discuss his recent sleep study findings. He is accompanied by his wife today. As you know, Shaun Pugh is a very pleasant 62 year old right-handed gentleman with an underlying medical history of hypertension, hyperlipidemia, coronary artery disease, obesity, stroke and seizures, who has been complaining of snoring and witnessed apneas. He had a split-night or attempted split-night sleep study on 11/24/2012 and I went over his test results with him in detail today. His baseline sleep efficiency was reduced at 40.1% with a long latency to sleep of 44 minutes and wake after sleep onset of 65 minutes with moderate sleep fragmentation noted. He had an elevated arousal index of 22.2 per hour based on primarily apneas and hypopneas. He had an increased percentage of stage I and 2 sleep and absence of slow-wave or REM sleep. He had one obstructive and 7 central apneas as well as 50 for obstructive hypopneas, rendering in overall AHI of 51 per hour. His baseline oxygen saturation was 94%, his nadir was 90%. He was started on CPAP but had been significant difficulty acclimatizing to CPAP and had trouble breathing through his nose and was noted to have mouth breathing. He was given a full face mask and was titrated on a low pressure of 5-6 cm but became more and more congested and anxious and requested that the study be discontinued. The he achieved only minimal sleep on CPAP and at approximately 3:30 AM the study was ended.  Of note, he was admitted to the hospital on 03/17/2012 with new onset of slurring of speech and left-sided weakness. He was treated with TPA with some improvement in  his deficits. He had full stroke workup including CT angiogram of head and neck and was started on aspirin and Plavix subsequently. He did develop a focal seizure and was started on Keppra but he stopped that in March. Post discharge he went through outpatient physical therapy. He had inpatient therapy before. He had received Botox injections without significant benefit. He now is on Plavix. He stopped his seizure medication because of irritability and did not notify you at the time. You had seen him recently on 11/15/2012 at which time he asked him to undergo a sleep study due to his history of snoring and breathing pauses reported by his wife. He also started him on Depakote he only took it for about 2 days as he was fearful of side effects and his wife read the side effect profile which included a possibility for pancreatitis and since he had a past history of pancreatitis he decided not to take it. He has had no recent seizure-like events or auras. He has been obese for years. He lost about 40 pounds while hospitalized and around the time he had the stroke but has gained it back practically all his weight. Of note, he has stopped smoking since his stroke. His typical bedtime is around 9:30 or 10 he falls asleep fairly quickly. He does wake up multiple times in the night and has to go to the bathroom about 3 times in an average night. He does not always wake up rested. He does have some daytime tiredness.  He  reports ongoing issues with nasal congestion. He uses nasal saline spray for that only. In the hospital he was given some Flonase while hospitalized. He tolerated that well. He denies any restless leg symptoms and is not known to take in his sleep. He had no PLMS during the sleep study. I explained to him that while his lowest oxygen value was 90 during the sleep study the fact that he had no REM sleep probably underestimates the severity of his partial sleep apnea. He is reluctant to try CPAP again but  feels that perhaps in the home setting he would have a somewhat easier time.  His Past Medical History Is Significant For: Past Medical History  Diagnosis Date  . High cholesterol   . Atrophy of right kidney 09/15/2011  . Kidney calculi 09/15/2011  . Acute pancreatitis 09/14/2011  . Cholelithiasis 09/14/2011  . Chronic back pain   . Sleep apnea     STOP BANG 5  . TIA (transient ischemic attack)   . CAD (coronary artery disease)   . Stented coronary artery approx 2004  . Stroke   . Seizures     His Past Surgical History Is Significant For: Past Surgical History  Procedure Laterality Date  . Back surgery      x 4   . Cholecystectomy  09/25/2011    Procedure: LAPAROSCOPIC CHOLECYSTECTOMY;  Surgeon: Fabio Bering, MD;  Location: [facility];  Service: General;  Laterality: N/A;    His Family History Is Significant For: Family History  Problem Relation Age of Onset  . Anesthesia problems Neg Hx   . Hypotension Neg Hx   . Malignant hyperthermia Neg Hx   . Pseudochol deficiency Neg Hx   . Congestive Heart Failure Mother   . Diabetes Father   . Heart attack Father     His Social History Is Significant For: History   Social History  . Marital Status: Married    Spouse Name: N/A    Number of Children: N/A  . Years of Education: N/A   Social History Main Topics  . Smoking status: Former Smoker -- 1.00 packs/day for 45 years    Types: Cigarettes    Quit date: 03/18/2012  . Smokeless tobacco: Never Used  . Alcohol Use: No  . Drug Use: No  . Sexually Active: Yes    Birth Control/ Protection: None   Other Topics Concern  . None   Social History Narrative  . None    His Allergies Are:  No Known Allergies:   His Current Medications Are:  Outpatient Encounter Prescriptions as of 12/20/2012  Medication Sig Dispense Refill  . clopidogrel (PLAVIX) 75 MG tablet Take 1 tablet (75 mg total) by mouth daily with breakfast.  30 tablet  1  . diclofenac sodium (VOLTAREN) 1 % GEL Apply  2 g topically 4 (four) times daily.  3 Tube  1  . fluticasone (FLONASE) 50 MCG/ACT nasal spray Place 2 sprays into the nose daily.  16 g  3  . hydrochlorothiazide (HYDRODIURIL) 25 MG tablet Take 1 tablet (25 mg total) by mouth daily.  30 tablet  1  . potassium chloride (K-DUR,KLOR-CON) 10 MEQ tablet Take 1 tablet (10 mEq total) by mouth 2 (two) times daily.  60 tablet  1  . rosuvastatin (CRESTOR) 10 MG tablet Take 10 mg by mouth daily.      . [DISCONTINUED] divalproex (DEPAKOTE ER) 500 MG 24 hr tablet Take 1,000 mg by mouth daily.  No facility-administered encounter medications on file as of 12/20/2012.  : Review of Systems  All other systems reviewed and are negative.    Objective:  Neurologic Exam  Physical Exam Physical Examination:   Filed Vitals:   12/20/12 0843  BP: 150/79  Pulse: 81    General Examination: The patient is a very pleasant 62 y.o. male in no acute distress. He appears well-developed and well-nourished and well groomed. He is obese.  HEENT: Normocephalic, atraumatic, pupils are equal, round and reactive to light and accommodation. Extraocular tracking is good without limitation to gaze excursion or nystagmus noted. Normal smooth pursuit is noted. Hearing is grossly intact. Nasal inspection shows significant mucosal thickening and swelling. No significant erythema or discharge is noted. No significant septal deviation is noted. Face is symmetric with normal facial animation and normal facial sensation. Speech is clear with no dysarthria noted. There is no hypophonia. There is no lip, neck/head, jaw or voice tremor. Neck is supple with full range of passive and active motion. There are no carotid bruits on auscultation. Oropharynx exam reveals: mild mouth dryness, adequate dental hygiene and moderate airway crowding, due to larger tongue and wider uvula as well as redundant soft palate. Mallampati is class II. Tongue protrudes centrally and palate elevates  symmetrically. Neck size is 19.5 inches.   Chest: Clear to auscultation without wheezing, rhonchi or crackles noted.  Heart: S1+S2+0, regular and normal without murmurs, rubs or gallops noted.   Abdomen: Soft, non-tender and non-distended with normal bowel sounds appreciated on auscultation.  Extremities: There is no pitting edema in the distal lower extremities bilaterally. Pedal pulses are intact.  Skin: Warm and dry without trophic changes noted. There are no varicose veins.  Musculoskeletal: exam reveals no obvious joint deformities, tenderness or joint swelling or erythema.   Neurologically:  Mental status: The patient is awake, alert and oriented in all 4 spheres. His memory, attention, language and knowledge are appropriate. There is no aphasia, agnosia, apraxia or anomia. Speech is clear with normal prosody and enunciation. Thought process is linear. Mood is congruent and affect is normal.  Cranial nerves are as described above under HEENT exam. In addition, shoulder shrug is normal with equal shoulder height noted. Motor exam: Normal bulk, strength and tone is noted on the right, and on the left he has 3/5 grip strength and 4-/5 proximal strength in the left upper extremity. In the left lower extremity he has about 4/5 strength. There is no drift, tremor or rebound. Romberg is negative. Reflexes are 2+ throughout. Cerebellar testing shows no dysmetria or intention tremor on finger to nose testing. There is no truncal or gait ataxia.  Sensory exam is intact to light touch.  Gait, station and balance: He stands up with mild difficulty and does have circumduction of the left leg while walking. He does flex his left arm involuntarily while walking. Balance is slightly impaired. He turns in 3 steps. He does not require a walking aid or assistance.  Assessment and Plan:  Assessment and Plan:  In summary, Shaun Pugh is a very pleasant 62 y.o.-year old male with a history of recent stroke  with residual left-sided weakness, obesity, diagnosis of seizures at the time of his stroke, with a recent confirmation of obstructive sleep apnea. Unfortunately he was not able to tolerate CPAP during the sleep study. I talked to him and his wife at length about the sleep test results and also pointed out that perhaps we have underestimated the degree of  his sleep apnea based on absence of REM sleep in the baseline portion of the test. I would like to suggest an auto Pap trial at home for about 30 days. To help him tolerate the nasal mask I would like to try him on nasal steroid spray in preparation but also during CPAP treatment. He was agreeable to trying this. To that end I sent a DME order for on her Pap trial for about a month and I would like to see him back in about 6 weeks. Of note he has not taken any seizure medication essentially since March but thankfully has not had any seizure-like events. He is advised to bring this up with you and also keep his appointment with you in early October. We talked about potential seizure triggers and seizure precautions as well today. I answered all their questions today and the patient and his wife were in agreement with the above outlined plan. I would like to see the patient back in 6 weeks, sooner if the need arises and encouraged them to call with any interim questions, concerns, problems or updates.   Thank you very much for allowing me to participate in the care of this nice patient. If I can be of any further assistance to you please do not hesitate to call me.  Sincerely,   Shaun Foley, MD, PhD

## 2013-01-31 ENCOUNTER — Encounter: Payer: Self-pay | Admitting: Neurology

## 2013-01-31 ENCOUNTER — Ambulatory Visit (INDEPENDENT_AMBULATORY_CARE_PROVIDER_SITE_OTHER): Payer: Medicare Other | Admitting: Neurology

## 2013-01-31 VITALS — BP 165/88 | HR 80 | Temp 96.7°F | Ht 72.0 in | Wt 270.0 lb

## 2013-01-31 DIAGNOSIS — R0981 Nasal congestion: Secondary | ICD-10-CM

## 2013-01-31 DIAGNOSIS — G4733 Obstructive sleep apnea (adult) (pediatric): Secondary | ICD-10-CM

## 2013-01-31 DIAGNOSIS — Z8673 Personal history of transient ischemic attack (TIA), and cerebral infarction without residual deficits: Secondary | ICD-10-CM

## 2013-01-31 DIAGNOSIS — J3489 Other specified disorders of nose and nasal sinuses: Secondary | ICD-10-CM

## 2013-01-31 DIAGNOSIS — R569 Unspecified convulsions: Secondary | ICD-10-CM

## 2013-01-31 DIAGNOSIS — Z789 Other specified health status: Secondary | ICD-10-CM

## 2013-01-31 NOTE — Patient Instructions (Signed)
I am going to set your CPAP pressure at 10 cm. Please reduce your humidifier to 1 or even none for better comfort.  Please continue using your CPAP regularly. While your insurance requires that you use CPAP at least 4 hours each night on 70% of the nights, I recommend, that you not skip any nights and use it throughout the night if you can. Getting used to CPAP does take time and patience and discipline. Untreated obstructive sleep apnea when it is moderate to severe can have an adverse impact on cardiovascular health and raise her risk for heart disease, arrhythmias, hypertension, congestive heart failure, stroke and diabetes. Untreated obstructive sleep apnea causes sleep disruption, nonrestorative sleep, and sleep deprivation. This can have an impact on your day to day functioning and cause daytime sleepiness and impairment of cognitive function, memory loss, mood disturbance, and problems focussing. Using CPAP regularly can improve these symptoms.

## 2013-01-31 NOTE — Progress Notes (Signed)
Subjective:    Patient ID: Shaun Pugh is a 62 y.o. male.  HPI  Interim history:   Shaun Pugh is a very pleasant 62 year old right-handed gentleman with an underlying medical history of hypertension, hyperlipidemia, coronary artery disease, obesity, stroke and seizures, who presents for followup consultation of his obstructive sleep apnea. He is accompanied by his wife again today.  I first met him on 12/20/2012 at which time he was advised about his recent split-night sleep study results. He has sleep study on 11/24/2012 and I discussed the test results with him and his wife in detail at the time. His baseline sleep efficiency was reduced at 40.1% with a long latency to sleep of 44 minutes and wake after sleep onset of 65 minutes with moderate sleep fragmentation noted. He had an elevated arousal index of 22.2 per hour based on primarily apneas and hypopneas. He had an increased percentage of stage I and 2 sleep and absence of slow-wave or REM sleep. He had one obstructive and 7 central apneas as well as 50 for obstructive hypopneas, rendering in overall AHI of 51 per hour. His baseline oxygen saturation was 94%, his nadir was 90%. He was started on CPAP but had been significant difficulty acclimatizing to CPAP and had trouble breathing through his nose and was noted to have mouth breathing. He was given a full face mask and was titrated on a low pressure of 5-6 cm but became more and more congested and anxious and requested that the study be discontinued. The he achieved only minimal sleep on CPAP and at approximately 3:30 AM the study was ended. At the time of his visit in August with me I suggested that he start using Flonase to help decrease nasal congestion and I suggested that he try AutoPap at home for about a month. I reviewed his compliance data from 12/21/2012 through 01/03/2013 which is a total of 14 days during which time he used it every day except for 2 days. Percent used days greater than  4 hours was 43% indicating suboptimal compliance. Average usage for all days was 3 hours and 36 minutes and average usage for the days he was on it was 4 hours and 12 minutes. His pressure was 5-10 cm on auto set. His median pressure was 7.9, the 95th percentile of his pressure was 9.6. His residual AHI was 3.6, indicating an adequate blood pressure arrange for him. He reports using the machine every night since he got it on 8/15. The compliance period is not correct. He does take it off after about 4 hours, he feels uncomfortable with the heated humidity. His humidifier was turned up to 4.5 which increased the moisture too much, then they reduced it to 2.5. He still has nasal congestion and uses Ocean spray and Flonase 1 h before. He is off of VPA because of the potential SE of pancreatitis and has had no new weakness, numbness, slurring of speech, no droopy face, no seizure-like event. He does not like to use the CPAP but has been compliant and I congratulated him on this.  His Past Medical History Is Significant For: Past Medical History  Diagnosis Date  . High cholesterol   . Atrophy of right kidney 09/15/2011  . Kidney calculi 09/15/2011  . Acute pancreatitis 09/14/2011  . Cholelithiasis 09/14/2011  . Chronic back pain   . Sleep apnea     STOP BANG 5  . TIA (transient ischemic attack)   . CAD (coronary artery disease)   .  Stented coronary artery approx 2004  . Stroke   . Seizures     His Past Surgical History Is Significant For: Past Surgical History  Procedure Laterality Date  . Back surgery      x 4   . Cholecystectomy  09/25/2011    Procedure: LAPAROSCOPIC CHOLECYSTECTOMY;  Surgeon: Fabio Bering, MD;  Location: [facility];  Service: General;  Laterality: N/A;    His Family History Is Significant For: Family History  Problem Relation Age of Onset  . Anesthesia problems Neg Hx   . Hypotension Neg Hx   . Malignant hyperthermia Neg Hx   . Pseudochol deficiency Neg Hx   . Congestive  Heart Failure Mother   . Diabetes Father   . Heart attack Father     His Social History Is Significant For: History   Social History  . Marital Status: Married    Spouse Name: N/A    Number of Children: N/A  . Years of Education: N/A   Social History Main Topics  . Smoking status: Former Smoker -- 1.00 packs/day for 45 years    Types: Cigarettes    Quit date: 03/18/2012  . Smokeless tobacco: Never Used  . Alcohol Use: No  . Drug Use: No  . Sexual Activity: Yes    Birth Control/ Protection: None   Other Topics Concern  . None   Social History Narrative  . None    His Allergies Are:  No Known Allergies:   His Current Medications Are:  Outpatient Encounter Prescriptions as of 01/31/2013  Medication Sig Dispense Refill  . clopidogrel (PLAVIX) 75 MG tablet Take 1 tablet (75 mg total) by mouth daily with breakfast.  30 tablet  1  . diclofenac sodium (VOLTAREN) 1 % GEL Apply 2 g topically 4 (four) times daily.  3 Tube  1  . fluticasone (FLONASE) 50 MCG/ACT nasal spray Place 2 sprays into the nose daily.  16 g  3  . hydrochlorothiazide (HYDRODIURIL) 25 MG tablet Take 1 tablet (25 mg total) by mouth daily.  30 tablet  1  . losartan (COZAAR) 50 MG tablet Take 50 mg by mouth daily.      . potassium chloride (K-DUR,KLOR-CON) 10 MEQ tablet Take 1 tablet (10 mEq total) by mouth 2 (two) times daily.  60 tablet  1  . rosuvastatin (CRESTOR) 10 MG tablet Take 10 mg by mouth daily.       No facility-administered encounter medications on file as of 01/31/2013.  : Review of Systems  All other systems reviewed and are negative.    Objective:  Neurologic Exam  Physical Exam Physical Examination:   Filed Vitals:   01/31/13 1426  BP: 165/88  Pulse: 80  Temp: 96.7 F (35.9 C)    General Examination: The patient is a very pleasant 62 y.o. male in no acute distress. He appears well-developed and well-nourished and well groomed. He is obese.  HEENT: Normocephalic, atraumatic,  pupils are equal, round and reactive to light and accommodation. Extraocular tracking is good without limitation to gaze excursion or nystagmus noted. Normal smooth pursuit is noted. Hearing is grossly intact. Nasal inspection shows significant mucosal thickening and swelling. No significant erythema or discharge is noted. No significant septal deviation is noted. Face is symmetric with normal facial animation and normal facial sensation. Speech is clear with no dysarthria noted. There is no hypophonia. There is no lip, neck/head, jaw or voice tremor. Neck is supple with full range of passive and active motion. There  are no carotid bruits on auscultation. Oropharynx exam reveals: mild mouth dryness, adequate dental hygiene and moderate airway crowding, due to larger tongue and wider uvula as well as redundant soft palate. Mallampati is class II. Tongue protrudes centrally and palate elevates symmetrically. Nasal inspection shows no significant nasal mucosal swelling or bogginess.   Chest: Clear to auscultation without wheezing, rhonchi or crackles noted.  Heart: S1+S2+0, regular and normal without murmurs, rubs or gallops noted.   Abdomen: Soft, non-tender and non-distended with normal bowel sounds appreciated on auscultation.  Extremities: There is no pitting edema in the distal lower extremities bilaterally. Pedal pulses are intact.  Skin: Warm and dry without trophic changes noted. There are no varicose veins.  Musculoskeletal: exam reveals no obvious joint deformities, tenderness or joint swelling or erythema.   Neurologically:  Mental status: The patient is awake, alert and oriented in all 4 spheres. His memory, attention, language and knowledge are appropriate. There is no aphasia, agnosia, apraxia or anomia. Speech is clear with normal prosody and enunciation. Thought process is linear. Mood is congruent and affect is normal.  Cranial nerves are as described above under HEENT exam. In  addition, shoulder shrug is normal with equal shoulder height noted. Motor exam: Normal bulk, strength and tone is noted on the right, and on the left he has 3/5 grip strength and 4-/5 proximal strength in the left upper extremity. In the left lower extremity he has about 4/5 strength. There is no drift, tremor or rebound. Romberg is negative. Reflexes are 2+ throughout. Cerebellar testing shows no dysmetria or intention tremor on finger to nose testing. There is no truncal or gait ataxia.  Sensory exam is intact to light touch.  Gait, station and balance: He stands up with mild difficulty and does have circumduction of the left leg while walking. He does flex his left arm involuntarily while walking. Balance is slightly impaired. He turns in 3 steps. He does not require a walking aid or assistance.  Assessment and Plan:   In summary, DAQUAWN SEELMAN is a very pleasant 62 y.o.-year old male with a history of recent stroke with residual left-sided weakness, obesity, diagnosis of seizures at the time of his stroke, with a recent confirmation of obstructive sleep apnea. He was not able to tolerate CPAP during the sleep study. He has tried autoPAP for the past month and has been compliant and his residual AHI was 3.6/h with the 95th percentile of his pressure at 9.6 cm. Therefore, I will prescribe CPAP at the set pressure of 10 cm for him. He is advised to reduce the humidifier setting to 1 or 0 at this time and continue saline spray and flonase for decongestion. His physical exam is stable and still struggles with compliance and tolerance. I encouraged him to continue to use CPAP regularly to help reduce cardiovascular risk.   We also talked about trying to maintaining a healthy lifestyle in general. I encouraged the patient to eat healthy, exercise daily and keep well hydrated, to keep a scheduled bedtime and wake time routine, to not skip any meals and eat healthy snacks in between meals and to have protein  with every meal. I stressed the importance of regular exercise and weight loss. He is advised to keep his followup appointment with Darrol Angel, NP, on 02/14/2013 and his appointment with the rehabilitation doctor on 02/17/2013.  I answered all their questions today and the patient and his wife were in agreement with the above outlined plan. I  would like to see the patient back in 3 months, sooner if the need arises and encouraged them to call with any interim questions, concerns, problems or updates.

## 2013-02-14 ENCOUNTER — Ambulatory Visit: Payer: Medicare Other | Admitting: Nurse Practitioner

## 2013-02-16 ENCOUNTER — Ambulatory Visit: Payer: Medicare Other | Admitting: Physical Medicine & Rehabilitation

## 2013-02-17 ENCOUNTER — Ambulatory Visit: Payer: Medicare Other | Admitting: Physical Medicine & Rehabilitation

## 2013-03-09 ENCOUNTER — Ambulatory Visit: Payer: Medicare Other | Admitting: Nurse Practitioner

## 2013-04-17 ENCOUNTER — Encounter: Payer: Self-pay | Admitting: Neurology

## 2013-04-19 NOTE — Progress Notes (Signed)
Quick Note:  I reviewed the patient's CPAP compliance data from 02/03/2013 to 04/13/2013, which is a total of 70 days, during which time the patient used CPAP every day except for 1 day. The average usage for all days was 3 hours and 20 minutes. The percent used days greater than 4 hours was 21 % only, indicating poor compliance. The residual AHI was 3.4 per hour, indicating an appropriate treatment pressure of 10 cwp with EPR of 3. We will get in touch with the patient's home health company to see if there is any immediate mask or pressure related or equipment related issues that prevent the patient from being compliant. We will also get in touch with the patient regarding his compliance. I will review this data with the patient at the next office visit, which is scheduled for 06/08/2012, provide feedback and additional troubleshooting if need be. We can offer the patient a sooner appointment as well.  [name], MD, PhD [facility] ([facility]   ______

## 2013-04-20 ENCOUNTER — Encounter: Payer: Self-pay | Admitting: Neurology

## 2013-05-20 ENCOUNTER — Emergency Department (HOSPITAL_COMMUNITY)
Admission: EM | Admit: 2013-05-20 | Discharge: 2013-05-21 | Disposition: A | Discharge status: Home / Self Care | Payer: Medicare Other | Attending: Emergency Medicine | Admitting: Emergency Medicine

## 2013-05-20 ENCOUNTER — Emergency Department (HOSPITAL_COMMUNITY): Payer: Medicare Other

## 2013-05-20 ENCOUNTER — Encounter (HOSPITAL_COMMUNITY): Payer: Self-pay | Admitting: Emergency Medicine

## 2013-05-20 DIAGNOSIS — Z7902 Long term (current) use of antithrombotics/antiplatelets: Secondary | ICD-10-CM | POA: Insufficient documentation

## 2013-05-20 DIAGNOSIS — Z8673 Personal history of transient ischemic attack (TIA), and cerebral infarction without residual deficits: Secondary | ICD-10-CM | POA: Insufficient documentation

## 2013-05-20 DIAGNOSIS — Z87442 Personal history of urinary calculi: Secondary | ICD-10-CM | POA: Insufficient documentation

## 2013-05-20 DIAGNOSIS — E7800 Pure hypercholesterolemia, unspecified: Secondary | ICD-10-CM | POA: Insufficient documentation

## 2013-05-20 DIAGNOSIS — I2510 Atherosclerotic heart disease of native coronary artery without angina pectoris: Secondary | ICD-10-CM | POA: Insufficient documentation

## 2013-05-20 DIAGNOSIS — Z87891 Personal history of nicotine dependence: Secondary | ICD-10-CM | POA: Insufficient documentation

## 2013-05-20 DIAGNOSIS — Z791 Long term (current) use of non-steroidal anti-inflammatories (NSAID): Secondary | ICD-10-CM | POA: Insufficient documentation

## 2013-05-20 DIAGNOSIS — Z87448 Personal history of other diseases of urinary system: Secondary | ICD-10-CM | POA: Insufficient documentation

## 2013-05-20 DIAGNOSIS — G8929 Other chronic pain: Secondary | ICD-10-CM | POA: Insufficient documentation

## 2013-05-20 DIAGNOSIS — Z9861 Coronary angioplasty status: Secondary | ICD-10-CM | POA: Insufficient documentation

## 2013-05-20 DIAGNOSIS — G4730 Sleep apnea, unspecified: Secondary | ICD-10-CM | POA: Insufficient documentation

## 2013-05-20 DIAGNOSIS — E669 Obesity, unspecified: Secondary | ICD-10-CM | POA: Insufficient documentation

## 2013-05-20 DIAGNOSIS — Z79899 Other long term (current) drug therapy: Secondary | ICD-10-CM | POA: Insufficient documentation

## 2013-05-20 DIAGNOSIS — R110 Nausea: Secondary | ICD-10-CM | POA: Insufficient documentation

## 2013-05-20 DIAGNOSIS — Z8719 Personal history of other diseases of the digestive system: Secondary | ICD-10-CM | POA: Insufficient documentation

## 2013-05-20 DIAGNOSIS — N23 Unspecified renal colic: Secondary | ICD-10-CM

## 2013-05-20 DIAGNOSIS — N1330 Unspecified hydronephrosis: Secondary | ICD-10-CM

## 2013-05-20 DIAGNOSIS — IMO0002 Reserved for concepts with insufficient information to code with codable children: Secondary | ICD-10-CM | POA: Insufficient documentation

## 2013-05-20 DIAGNOSIS — R339 Retention of urine, unspecified: Secondary | ICD-10-CM | POA: Insufficient documentation

## 2013-05-20 HISTORY — DX: Unspecified hydronephrosis: N13.30

## 2013-05-20 LAB — CBC WITH DIFFERENTIAL/PLATELET
BASOS PCT: 0 % (ref 0–1)
Basophils Absolute: 0 10*3/uL (ref 0.0–0.1)
Eosinophils Absolute: 0 10*3/uL (ref 0.0–0.7)
Eosinophils Relative: 0 % (ref 0–5)
HCT: 39.9 % (ref 39.0–52.0)
Hemoglobin: 13.8 g/dL (ref 13.0–17.0)
LYMPHS PCT: 7 % — AB (ref 12–46)
Lymphs Abs: 1.3 10*3/uL (ref 0.7–4.0)
MCH: 29.9 pg (ref 26.0–34.0)
MCHC: 34.6 g/dL (ref 30.0–36.0)
MCV: 86.4 fL (ref 78.0–100.0)
Monocytes Absolute: 1.2 10*3/uL — ABNORMAL HIGH (ref 0.1–1.0)
Monocytes Relative: 6 % (ref 3–12)
NEUTROS ABS: 16.2 10*3/uL — AB (ref 1.7–7.7)
Neutrophils Relative %: 87 % — ABNORMAL HIGH (ref 43–77)
PLATELETS: 209 10*3/uL (ref 150–400)
RBC: 4.62 MIL/uL (ref 4.22–5.81)
RDW: 13.7 % (ref 11.5–15.5)
WBC: 18.7 10*3/uL — AB (ref 4.0–10.5)

## 2013-05-20 LAB — URINALYSIS, ROUTINE W REFLEX MICROSCOPIC
Bilirubin Urine: NEGATIVE
GLUCOSE, UA: NEGATIVE mg/dL
Ketones, ur: NEGATIVE mg/dL
Leukocytes, UA: NEGATIVE
Nitrite: POSITIVE — AB
Protein, ur: NEGATIVE mg/dL
Urobilinogen, UA: 0.2 mg/dL (ref 0.0–1.0)
pH: 5.5 (ref 5.0–8.0)

## 2013-05-20 LAB — BASIC METABOLIC PANEL
BUN: 23 mg/dL (ref 6–23)
CALCIUM: 9.1 mg/dL (ref 8.4–10.5)
CHLORIDE: 100 meq/L (ref 96–112)
CO2: 23 meq/L (ref 19–32)
CREATININE: 1.65 mg/dL — AB (ref 0.50–1.35)
GFR calc non Af Amer: 43 mL/min — ABNORMAL LOW (ref >90)
GFR, EST AFRICAN AMERICAN: 49 mL/min — AB (ref >90)
Glucose, Bld: 145 mg/dL — ABNORMAL HIGH (ref 70–99)
Potassium: 4.4 mEq/L (ref 3.7–5.3)
SODIUM: 137 meq/L (ref 137–147)

## 2013-05-20 LAB — URINE MICROSCOPIC-ADD ON

## 2013-05-20 MED ORDER — KETOROLAC TROMETHAMINE 30 MG/ML IJ SOLN
30.0000 mg | Freq: Once | INTRAMUSCULAR | Status: AC
  Administered 2013-05-20: 30 mg via INTRAVENOUS
  Filled 2013-05-20: qty 1

## 2013-05-20 MED ORDER — ONDANSETRON HCL 4 MG/2ML IJ SOLN
4.0000 mg | Freq: Once | INTRAMUSCULAR | Status: AC
  Administered 2013-05-20: 4 mg via INTRAVENOUS
  Filled 2013-05-20: qty 2

## 2013-05-20 MED ORDER — DEXTROSE 5 % IV SOLN
1.0000 g | Freq: Once | INTRAVENOUS | Status: AC
  Administered 2013-05-20: 1 g via INTRAVENOUS
  Filled 2013-05-20: qty 10

## 2013-05-20 MED ORDER — MORPHINE SULFATE 4 MG/ML IJ SOLN
8.0000 mg | Freq: Once | INTRAMUSCULAR | Status: AC
  Administered 2013-05-20: 8 mg via INTRAVENOUS
  Filled 2013-05-20: qty 2

## 2013-05-20 MED ORDER — SODIUM CHLORIDE 0.9 % IV BOLUS (SEPSIS)
1000.0000 mL | Freq: Once | INTRAVENOUS | Status: AC
  Administered 2013-05-20: 1000 mL via INTRAVENOUS

## 2013-05-20 MED ORDER — CEPHALEXIN 500 MG PO CAPS: ORAL_CAPSULE | ORAL | Status: DC

## 2013-05-20 MED ORDER — METOCLOPRAMIDE HCL 10 MG PO TABS: 10.0000 mg | ORAL_TABLET | Freq: Four times a day (QID) | ORAL | Status: DC | PRN

## 2013-05-20 MED ORDER — MORPHINE SULFATE 4 MG/ML IJ SOLN: 4.0000 mg | Freq: Once | INTRAMUSCULAR | Status: DC

## 2013-05-20 MED ORDER — OXYCODONE-ACETAMINOPHEN 5-325 MG PO TABS: 2.0000 | ORAL_TABLET | ORAL | Status: DC | PRN

## 2013-05-20 MED ORDER — ONDANSETRON 8 MG PO TBDP: ORAL_TABLET | ORAL | Status: DC

## 2013-05-20 NOTE — ED Notes (Signed)
Pt complaining of right flank pain along with urinary retention since 0430 today. States "this doesn't feel like the last kidney stone that I had."

## 2013-05-20 NOTE — Discharge Instructions (Signed)
Kidney Stones Kidney stones (urolithiasis) are solid masses that form inside your kidneys. The intense pain is caused by the stone moving through the kidney, ureter, bladder, and urethra (urinary tract). When the stone moves, the ureter starts to spasm around the stone. The stone is usually passed in your pee (urine).  HOME CARE  Drink enough fluids to keep your pee clear or pale yellow. This helps to get the stone out.  Strain all pee through the provided strainer. Do not pee without peeing through the strainer, not even once. If you pee the stone out, catch it in the strainer. The stone may be as small as a grain of salt. Take this to your doctor. This will help your doctor figure out what you can do to try to prevent more kidney stones.  Only take medicine as told by your doctor.  Follow up with your doctor as told.  Get follow-up X-rays as told by your doctor. GET HELP IF: You have pain that gets worse even if you have been taking pain medicine. GET HELP RIGHT AWAY IF:   Your pain does not get better with medicine.  You have a fever or shaking chills.  Your pain increases and gets worse over 18 hours.  You have uncontrolled vomiting.  You have new belly (abdominal) pain.  You feel faint or pass out.  You are unable to pee. MAKE SURE YOU:   Understand these instructions.  Will watch your condition.  Will get help right away if you are not doing well or get worse. Document Released: 10/14/2007 Document Revised: 12/28/2012 Document Reviewed: 09/28/2012 [facility] Patient Information 2014 [place], [place].

## 2013-05-20 NOTE — ED Notes (Signed)
Asked pt for urine sample but pt says just voided.  Instructed pt to notify staff when could void again so we obtain urine sample.

## 2013-05-20 NOTE — ED Provider Notes (Signed)
CSN: [id number]     Arrival date & time 05/20/13  1424 History   First MD Initiated Contact with Patient 05/20/13 1705     Chief Complaint  Patient presents with  . Flank Pain  . Urinary Retention   (Consider location/radiation/quality/duration/timing/severity/associated sxs/prior Treatment) HPI 63 year old male history of obesity prior stroke left-sided hemiparesis CPAP for sleep apnea and kidney stones presents with 12 hours right flank colicky pain with nausea without vomiting no fever no chest pain no shortness breath no abdominal pain no testicular pain no dysuria no gross hematuria no new weakness or numbness to his legs no treatment prior to arrival has moderately severe. Past Medical History  Diagnosis Date  . High cholesterol   . Atrophy of right kidney 09/15/2011  . Kidney calculi 09/15/2011  . Acute pancreatitis 09/14/2011  . Cholelithiasis 09/14/2011  . Chronic back pain   . Sleep apnea     STOP BANG 5  . TIA (transient ischemic attack)   . CAD (coronary artery disease)   . Stented coronary artery approx 2004  . Stroke   . Seizures    Past Surgical History  Procedure Laterality Date  . Back surgery      x 4   . Cholecystectomy  09/25/2011    Procedure: LAPAROSCOPIC CHOLECYSTECTOMY;  Surgeon: Fabio Bering, MD;  Location: [facility];  Service: General;  Laterality: N/A;   Family History  Problem Relation Age of Onset  . Anesthesia problems Neg Hx   . Hypotension Neg Hx   . Malignant hyperthermia Neg Hx   . Pseudochol deficiency Neg Hx   . Congestive Heart Failure Mother   . Diabetes Father   . Heart attack Father    History  Substance Use Topics  . Smoking status: Former Smoker -- 1.00 packs/day for 45 years    Types: Cigarettes    Quit date: 03/18/2012  . Smokeless tobacco: Never Used  . Alcohol Use: No    Review of Systems 10 Systems reviewed and are negative for acute change except as noted in the HPI. Allergies  Review of patient's allergies indicates no  known allergies.  Home Medications   Current Outpatient Rx  Name  Route  Sig  Dispense  Refill  . clopidogrel (PLAVIX) 75 MG tablet   Oral   Take 75 mg by mouth at bedtime.         . fluticasone (FLONASE) 50 MCG/ACT nasal spray   Each Nare   Place 2 sprays into both nostrils at bedtime.         Marland Kitchen losartan-hydrochlorothiazide (HYZAAR) 50-12.5 MG per tablet   Oral   Take 1 tablet by mouth daily.         . potassium chloride (K-DUR) 10 MEQ tablet   Oral   Take 10 mEq by mouth 2 (two) times daily.         . potassium chloride SA (K-DUR,KLOR-CON) 20 MEQ tablet   Oral   Take 20 mEq by mouth 2 (two) times daily.         . rosuvastatin (CRESTOR) 10 MG tablet   Oral   Take 10 mg by mouth every morning.         . cephALEXin (KEFLEX) 500 MG capsule      2 caps po bid x 7 days   28 capsule   0   . diclofenac sodium (VOLTAREN) 1 % GEL   Topical   Apply 2 g topically 4 (four) times daily.  3 Tube   1   . metoCLOPramide (REGLAN) 10 MG tablet   Oral   Take 1 tablet (10 mg total) by mouth every 6 (six) hours as needed for nausea (nausea/headache).   6 tablet   0   . ondansetron (ZOFRAN ODT) 8 MG disintegrating tablet      8mg  ODT q4 hours prn nausea   4 tablet   0   . oxyCODONE-acetaminophen (PERCOCET) 5-325 MG per tablet   Oral   Take 2 tablets by mouth every 4 (four) hours as needed.   20 tablet   0    BP 109/93  Pulse 92  Temp(Src) 97.3 F (36.3 C) (Oral)  Resp 18  Ht 6' (1.829 m)  Wt 260 lb (117.935 kg)  BMI 35.25 kg/m2  SpO2 96% Physical Exam  Nursing note and vitals reviewed. Constitutional:  Awake, alert, nontoxic appearance.  HENT:  Head: Atraumatic.  Eyes: Right eye exhibits no discharge. Left eye exhibits no discharge.  Neck: Neck supple.  Cardiovascular: Normal rate and regular rhythm.   No murmur heard. Pulmonary/Chest: Effort normal and breath sounds normal. No respiratory distress. He has no wheezes. He has no rales. He  exhibits no tenderness.  Abdominal: Soft. Bowel sounds are normal. He exhibits no distension and no mass. There is no tenderness. There is no rebound and no guarding.  Genitourinary:  Scrotum nontender positive right CVA tenderness  Musculoskeletal: He exhibits no tenderness.  Baseline ROM, no obvious new focal weakness.  Neurological:  Mental status and motor strength appears baseline for patient and situation. Baseline left hemiparesis.  Skin: No rash noted.  Psychiatric: He has a normal mood and affect.    ED Course  Procedures (including critical care time) Pain well-controlled wants discharge. Will cover for possible UTI, mild renal insufficiency but feel outpatient f/u reasonable, Pt has been to [facility] in past.Patient / Family / Caregiver informed of clinical course, understand medical decision-making process, and agree with plan. Labs Review Labs Reviewed  URINALYSIS, ROUTINE W REFLEX MICROSCOPIC - Abnormal; Notable for the following:    Specific Gravity, Urine >1.030 (*)    Hgb urine dipstick SMALL (*)    Nitrite POSITIVE (*)    All other components within normal limits  CBC WITH DIFFERENTIAL - Abnormal; Notable for the following:    WBC 18.7 (*)    Neutrophils Relative % 87 (*)    Neutro Abs 16.2 (*)    Lymphocytes Relative 7 (*)    Monocytes Absolute 1.2 (*)    All other components within normal limits  BASIC METABOLIC PANEL - Abnormal; Notable for the following:    Glucose, Bld 145 (*)    Creatinine, Ser 1.65 (*)    GFR calc non Af Amer 43 (*)    GFR calc Af Amer 49 (*)    All other components within normal limits  URINE MICROSCOPIC-ADD ON - Abnormal; Notable for the following:    Squamous Epithelial / LPF FEW (*)    Bacteria, UA FEW (*)    All other components within normal limits  URINE CULTURE   Imaging Review Ct Abdomen Pelvis Wo Contrast  05/20/2013   CLINICAL DATA:  Flank pain.  EXAM: CT ABDOMEN AND PELVIS WITHOUT CONTRAST  TECHNIQUE:  Multidetector CT imaging of the abdomen and pelvis was performed following the standard protocol without intravenous contrast.  COMPARISON:  CT scan of Sep 14, 2011.  FINDINGS: Status post surgical stabilization of L4-5 disc space. Visualized lung bases appear normal.  Status post cholecystectomy. These unenhanced images demonstrate no focal abnormality of the liver, spleen or pancreas. Adrenal glands appear normal. Left kidney and ureter appear normal. Moderate to severe right hydroureteronephrosis is noted secondary to 10 x 7 mm calculus in the distal right ureter. Nonobstructive calculi are noted in midpole collecting system of right kidney. Mild right perinephric stranding is noted. There is no evidence of bowel obstruction. No abnormal fluid collection is noted. Urinary bladder is decompressed. No significant adenopathy is noted. Mild atherosclerotic calcifications of abdominal aorta are noted without aneurysm formation. Stable adenopathy is noted in porta hepatis region. Stable complex low density lesion seen arising posteriorly from midpole of left kidney. Stable exophytic abnormality arising anteriorly from midpole of left kidney.  IMPRESSION: Moderate to severe right hydroureteronephrosis with mild perinephric stranding secondary to 10 x 7 mm calculus in distal right ureter. Right nephrolithiasis is noted as well.   Electronically Signed   By: Roque Lias M.D.   On: 05/20/2013 18:19    EKG Interpretation   None       MDM   1. Renal colic    I doubt any other EMC precluding discharge at this time including, but not necessarily limited to the following:sepsis.    Hurman Horn, MD 05/21/13 1254

## 2013-05-23 ENCOUNTER — Encounter (HOSPITAL_COMMUNITY): Payer: Self-pay | Admitting: Pharmacy Technician

## 2013-05-23 ENCOUNTER — Other Ambulatory Visit: Payer: Self-pay | Admitting: Urology

## 2013-05-23 NOTE — Progress Notes (Signed)
Surgery scheduled for 05/26/13.  Need orders in EPIC. Thank You.  

## 2013-05-23 NOTE — Progress Notes (Signed)
Preop on 05/24/13 at 1000am.  Surgery on 05/26/13.  Marland Kitchen.  Need orders in EPIC.  Thank You.

## 2013-05-24 ENCOUNTER — Ambulatory Visit (HOSPITAL_COMMUNITY)
Admission: RE | Admit: 2013-05-24 | Discharge: 2013-05-24 | Disposition: A | Discharge status: Home / Self Care | Payer: Medicare Other | Source: Ambulatory Visit | Attending: Urology | Admitting: Urology

## 2013-05-24 ENCOUNTER — Telehealth (HOSPITAL_COMMUNITY): Payer: Self-pay | Admitting: *Deleted

## 2013-05-24 ENCOUNTER — Encounter (HOSPITAL_COMMUNITY): Payer: Self-pay

## 2013-05-24 ENCOUNTER — Encounter (HOSPITAL_COMMUNITY)
Admission: RE | Admit: 2013-05-24 | Discharge: 2013-05-24 | Disposition: A | Discharge status: Home / Self Care | Payer: Medicare Other | Source: Ambulatory Visit | Attending: Urology | Admitting: Urology

## 2013-05-24 DIAGNOSIS — N201 Calculus of ureter: Secondary | ICD-10-CM | POA: Insufficient documentation

## 2013-05-24 DIAGNOSIS — Z01810 Encounter for preprocedural cardiovascular examination: Secondary | ICD-10-CM | POA: Insufficient documentation

## 2013-05-24 DIAGNOSIS — Z01818 Encounter for other preprocedural examination: Secondary | ICD-10-CM | POA: Insufficient documentation

## 2013-05-24 DIAGNOSIS — Z01812 Encounter for preprocedural laboratory examination: Secondary | ICD-10-CM | POA: Insufficient documentation

## 2013-05-24 LAB — BASIC METABOLIC PANEL
BUN: 32 mg/dL — ABNORMAL HIGH (ref 6–23)
CHLORIDE: 97 meq/L (ref 96–112)
CO2: 25 meq/L (ref 19–32)
Calcium: 9.1 mg/dL (ref 8.4–10.5)
Creatinine, Ser: 1.72 mg/dL — ABNORMAL HIGH (ref 0.50–1.35)
GFR calc non Af Amer: 41 mL/min — ABNORMAL LOW (ref >90)
GFR, EST AFRICAN AMERICAN: 47 mL/min — AB (ref >90)
Glucose, Bld: 140 mg/dL — ABNORMAL HIGH (ref 70–99)
POTASSIUM: 4.1 meq/L (ref 3.7–5.3)
Sodium: 135 mEq/L — ABNORMAL LOW (ref 137–147)

## 2013-05-24 LAB — URINE CULTURE: Colony Count: >=100000

## 2013-05-24 LAB — CBC
HCT: 37.5 % — ABNORMAL LOW (ref 39.0–52.0)
Hemoglobin: 13.2 g/dL (ref 13.0–17.0)
MCH: 29.9 pg (ref 26.0–34.0)
MCHC: 35.2 g/dL (ref 30.0–36.0)
MCV: 85 fL (ref 78.0–100.0)
PLATELETS: 227 10*3/uL (ref 150–400)
RBC: 4.41 MIL/uL (ref 4.22–5.81)
RDW: 13.5 % (ref 11.5–15.5)
WBC: 10.9 10*3/uL — ABNORMAL HIGH (ref 4.0–10.5)

## 2013-05-24 NOTE — Pre-Procedure Instructions (Signed)
PT'S PREOP BMET REPORT FAXED TO DR. HERRICK'S OFFICE FOR REVIEW OF ELEVATED BUN, CREATININE.

## 2013-05-24 NOTE — Pre-Procedure Instructions (Signed)
EKG AND CXR WERE DONE TODAY - PREOP AT [facility]. 

## 2013-05-24 NOTE — ED Notes (Signed)
rx called to pharmacy by Shannon [facility] 

## 2013-05-24 NOTE — ED Notes (Signed)
Post ED Visit - Positive Culture Follow-up: Successful Patient Follow-Up  Culture assessed and recommendations reviewed by: [x]  Wes Dulaney, Pharm.D., BCPS []  Celedonio MiyamotoJeremy Frens, Pharm.D., BCPS []  Georgina PillionElizabeth Martin, Pharm.D., BCPS []  GranvilleMinh Pham, 17[address] Rainbow BoulevardPharm.D., BCPS, AAHIVP []  Estella HuskMichelle Turner, Pharm.D., BCPS, AAHIVP  Positive urine culture  []  Patient discharged without antimicrobial prescription and treatment is now indicated [x]  Organism is resistant to prescribed ED discharge antimicrobial []  Patient with positive blood cultures  Changes discussed with ED provider: Rubye OaksErin O'Mallery New antibiotic prescription Stop Kelfex-.Start Macrobid 100 mg po BID x 7 days Called to [facility]   Contacted patient Patient contacetd date 05/24/2013 time 1641  Larena Soxunnally, Meghin Thivierge Marie 05/24/2013, 4:31 PM

## 2013-05-24 NOTE — Patient Instructions (Signed)
[facility]  ON:   Friday  1/16  REPORT TO  SHORT STAY CENTER AT:  10:00 AM      PHONE # FOR SHORT STAY IS [phone]  DO NOT EAT OR DRINK ANYTHING AFTER MIDNIGHT THE NIGHT BEFORE YOUR SURGERY.  YOU MAY BRUSH YOUR TEETH, RINSE OUT YOUR MOUTH--BUT NO WATER, NO FOOD, NO CHEWING GUM, NO MINTS, NO CANDIES, NO CHEWING TOBACCO.  PLEASE TAKE THE FOLLOWING MEDICATIONS THE AM OF YOUR SURGERY WITH A FEW SIPS OF WATER:  CRESTOR.  MAY TAKE ONDANSETRON IF NEEDED FOR NAUSEA AND MAY TAKE OXYCODONE / ACETAMINOPHEN FOR PAIN IF NEEDED.   DO NOT BRING VALUABLES, MONEY, CREDIT CARDS.  DO NOT WEAR JEWELRY, MAKE-UP, NAIL POLISH AND NO METAL PINS OR CLIPS IN YOUR HAIR. CONTACT LENS, DENTURES / PARTIALS, GLASSES SHOULD NOT BE WORN TO SURGERY AND IN MOST CASES-HEARING AIDS WILL NEED TO BE REMOVED.  BRING YOUR GLASSES CASE, ANY EQUIPMENT NEEDED FOR YOUR CONTACT LENS. FOR PATIENTS ADMITTED TO THE HOSPITAL--CHECK OUT TIME THE DAY OF DISCHARGE IS 11:00 AM.  ALL INPATIENT ROOMS ARE PRIVATE - WITH BATHROOM, TELEPHONE, TELEVISION AND WIFI INTERNET.  IF YOU ARE BEING DISCHARGED THE SAME DAY OF YOUR SURGERY--YOU CAN NOT DRIVE YOURSELF HOME--AND SHOULD NOT GO HOME ALONE BY TAXI OR BUS.  NO DRIVING OR OPERATING MACHINERY, DO NOT MAKE LEGAL DECISIONS FOR 24 HOURS FOLLOWING ANESTHESIA / PAIN MEDICATIONS.  PLEASE MAKE ARRANGEMENTS FOR SOMEONE TO BE WITH YOU AT HOME THE FIRST 24 HOURS AFTER SURGERY. RESPONSIBLE DRIVER'S NAME / PHONE                                                    FAILURE TO FOLLOW THESE INSTRUCTIONS MAY RESULT IN THE CANCELLATION OF YOUR SURGERY. PLEASE BE AWARE THAT YOU MAY NEED ADDITIONAL BLOOD DRAWN DAY OF YOUR SURGERY  PATIENT SIGNATURE_________________________________

## 2013-05-24 NOTE — Progress Notes (Signed)
ED Antimicrobial Stewardship Positive Culture Follow Up   Ronney LionRoy Orihuela is an 63 y.o. male who presented to [facility] on 05/20/2013 with a chief complaint of  Chief Complaint  Patient presents with  . Flank Pain  . Urinary Retention    Recent Results (from the past 720 hour(s))  URINE CULTURE     Status: None   Collection Time    05/20/13  9:58 PM      Result Value Range Status   Specimen Description URINE, CATHETERIZED   Final   Special Requests NONE   Final   Culture  Setup Time     Final   Value: 05/21/2013 19:58     Performed at [facility]   Colony Count     Final   Value: >=100,000 COLONIES/ML     Performed at [facility]   Culture     Final   Value: STAPHYLOCOCCUS SPECIES (COAGULASE NEGATIVE)     Note: RIFAMPIN AND GENTAMICIN SHOULD NOT BE USED AS SINGLE DRUGS FOR TREATMENT OF STAPH INFECTIONS.     Performed at [facility]   Report Status 05/24/2013 FINAL   Final   Organism ID, Bacteria STAPHYLOCOCCUS SPECIES (COAGULASE NEGATIVE)   Final    [x]  Treated with Keflex, organism resistant to prescribed antimicrobial []  Patient discharged originally without antimicrobial agent and treatment is now indicated  Recommendation: Stop Keflex. Start Macrobid 100mg  PO BID x 7 days and follow-up with urologist.   ED Provider: Junius FinnerErin O'Malley, PA-C   Cleon DewDulaney, Saticoy Robert 05/24/2013, 9:51 AM Infectious Diseases Pharmacist Phone# [phone]

## 2013-05-26 ENCOUNTER — Encounter (HOSPITAL_COMMUNITY): Payer: Self-pay | Admitting: *Deleted

## 2013-05-26 ENCOUNTER — Ambulatory Visit (HOSPITAL_COMMUNITY)
Admission: RE | Admit: 2013-05-26 | Discharge: 2013-05-26 | Disposition: A | Discharge status: Home / Self Care | Payer: Medicare Other | Source: Ambulatory Visit | Attending: Urology | Admitting: Urology

## 2013-05-26 ENCOUNTER — Encounter (HOSPITAL_COMMUNITY)
Admission: RE | Disposition: A | Discharge status: Home / Self Care | Payer: Self-pay | Source: Ambulatory Visit | Attending: Urology

## 2013-05-26 ENCOUNTER — Encounter (HOSPITAL_COMMUNITY): Payer: Medicare Other | Admitting: Certified Registered Nurse Anesthetist

## 2013-05-26 ENCOUNTER — Ambulatory Visit (HOSPITAL_COMMUNITY): Payer: Medicare Other | Admitting: Certified Registered Nurse Anesthetist

## 2013-05-26 DIAGNOSIS — E669 Obesity, unspecified: Secondary | ICD-10-CM | POA: Insufficient documentation

## 2013-05-26 DIAGNOSIS — I4891 Unspecified atrial fibrillation: Secondary | ICD-10-CM | POA: Insufficient documentation

## 2013-05-26 DIAGNOSIS — N201 Calculus of ureter: Secondary | ICD-10-CM | POA: Insufficient documentation

## 2013-05-26 DIAGNOSIS — Z7902 Long term (current) use of antithrombotics/antiplatelets: Secondary | ICD-10-CM | POA: Insufficient documentation

## 2013-05-26 DIAGNOSIS — N210 Calculus in bladder: Secondary | ICD-10-CM | POA: Insufficient documentation

## 2013-05-26 DIAGNOSIS — N390 Urinary tract infection, site not specified: Secondary | ICD-10-CM | POA: Insufficient documentation

## 2013-05-26 DIAGNOSIS — I69959 Hemiplegia and hemiparesis following unspecified cerebrovascular disease affecting unspecified side: Secondary | ICD-10-CM | POA: Insufficient documentation

## 2013-05-26 DIAGNOSIS — R339 Retention of urine, unspecified: Secondary | ICD-10-CM | POA: Insufficient documentation

## 2013-05-26 DIAGNOSIS — Z8674 Personal history of sudden cardiac arrest: Secondary | ICD-10-CM | POA: Insufficient documentation

## 2013-05-26 DIAGNOSIS — I10 Essential (primary) hypertension: Secondary | ICD-10-CM | POA: Insufficient documentation

## 2013-05-26 DIAGNOSIS — N135 Crossing vessel and stricture of ureter without hydronephrosis: Secondary | ICD-10-CM | POA: Insufficient documentation

## 2013-05-26 DIAGNOSIS — R351 Nocturia: Secondary | ICD-10-CM | POA: Insufficient documentation

## 2013-05-26 DIAGNOSIS — A4901 Methicillin susceptible Staphylococcus aureus infection, unspecified site: Secondary | ICD-10-CM | POA: Insufficient documentation

## 2013-05-26 DIAGNOSIS — N200 Calculus of kidney: Secondary | ICD-10-CM

## 2013-05-26 DIAGNOSIS — E7800 Pure hypercholesterolemia, unspecified: Secondary | ICD-10-CM | POA: Insufficient documentation

## 2013-05-26 DIAGNOSIS — R39198 Other difficulties with micturition: Secondary | ICD-10-CM | POA: Insufficient documentation

## 2013-05-26 HISTORY — PX: CYSTOSCOPY WITH RETROGRADE PYELOGRAM, URETEROSCOPY AND STENT PLACEMENT: SHX5789

## 2013-05-26 SURGERY — CYSTOURETEROSCOPY, WITH RETROGRADE PYELOGRAM AND STENT INSERTION
Anesthesia: General | Laterality: Right

## 2013-05-26 MED ORDER — IOHEXOL 300 MG/ML  SOLN
INTRAMUSCULAR | Status: DC | PRN
  Administered 2013-05-26: 40 mL via URETHRAL

## 2013-05-26 MED ORDER — LACTATED RINGERS IV SOLN: INTRAVENOUS | Status: DC

## 2013-05-26 MED ORDER — PHENYLEPHRINE 40 MCG/ML (10ML) SYRINGE FOR IV PUSH (FOR BLOOD PRESSURE SUPPORT)
PREFILLED_SYRINGE | INTRAVENOUS | Status: AC
  Filled 2013-05-26: qty 10

## 2013-05-26 MED ORDER — FENTANYL CITRATE 0.05 MG/ML IJ SOLN
INTRAMUSCULAR | Status: DC | PRN
  Administered 2013-05-26 (×2): 50 ug via INTRAVENOUS
  Administered 2013-05-26: 100 ug via INTRAVENOUS
  Administered 2013-05-26: 50 ug via INTRAVENOUS

## 2013-05-26 MED ORDER — ROCURONIUM BROMIDE 100 MG/10ML IV SOLN
INTRAVENOUS | Status: DC | PRN
  Administered 2013-05-26: 40 mg via INTRAVENOUS

## 2013-05-26 MED ORDER — MIDAZOLAM HCL 2 MG/2ML IJ SOLN
INTRAMUSCULAR | Status: AC
  Filled 2013-05-26: qty 2

## 2013-05-26 MED ORDER — SODIUM CHLORIDE 0.9 % IJ SOLN
INTRAMUSCULAR | Status: AC
  Filled 2013-05-26: qty 10

## 2013-05-26 MED ORDER — ONDANSETRON HCL 4 MG/2ML IJ SOLN
INTRAMUSCULAR | Status: AC
  Filled 2013-05-26: qty 2

## 2013-05-26 MED ORDER — MIDAZOLAM HCL 5 MG/5ML IJ SOLN
INTRAMUSCULAR | Status: DC | PRN
  Administered 2013-05-26: 1 mg via INTRAVENOUS

## 2013-05-26 MED ORDER — CIPROFLOXACIN IN D5W 400 MG/200ML IV SOLN
INTRAVENOUS | Status: AC
  Filled 2013-05-26: qty 200

## 2013-05-26 MED ORDER — FENTANYL CITRATE 0.05 MG/ML IJ SOLN
INTRAMUSCULAR | Status: AC
  Filled 2013-05-26: qty 5

## 2013-05-26 MED ORDER — GLYCOPYRROLATE 0.2 MG/ML IJ SOLN
INTRAMUSCULAR | Status: AC
  Filled 2013-05-26: qty 1

## 2013-05-26 MED ORDER — GLYCOPYRROLATE 0.2 MG/ML IJ SOLN
INTRAMUSCULAR | Status: AC
  Filled 2013-05-26: qty 3

## 2013-05-26 MED ORDER — VANCOMYCIN HCL IN DEXTROSE 1-5 GM/200ML-% IV SOLN
INTRAVENOUS | Status: AC
  Filled 2013-05-26: qty 200

## 2013-05-26 MED ORDER — NEOSTIGMINE METHYLSULFATE 1 MG/ML IJ SOLN
INTRAMUSCULAR | Status: DC | PRN
  Administered 2013-05-26: 4 mg via INTRAVENOUS

## 2013-05-26 MED ORDER — PROMETHAZINE HCL 25 MG/ML IJ SOLN
12.5000 mg | INTRAMUSCULAR | Status: DC | PRN
  Administered 2013-05-26: 6.25 mg via INTRAVENOUS

## 2013-05-26 MED ORDER — EPHEDRINE SULFATE 50 MG/ML IJ SOLN
INTRAMUSCULAR | Status: AC
  Filled 2013-05-26: qty 1

## 2013-05-26 MED ORDER — FENTANYL CITRATE 0.05 MG/ML IJ SOLN
INTRAMUSCULAR | Status: AC
  Filled 2013-05-26: qty 2

## 2013-05-26 MED ORDER — LIDOCAINE HCL (CARDIAC) 20 MG/ML IV SOLN
INTRAVENOUS | Status: AC
  Filled 2013-05-26: qty 5

## 2013-05-26 MED ORDER — LIDOCAINE HCL 2 % EX GEL
CUTANEOUS | Status: DC | PRN
  Administered 2013-05-26: 1 via URETHRAL

## 2013-05-26 MED ORDER — SODIUM CHLORIDE 0.9 % IR SOLN
Status: DC | PRN
  Administered 2013-05-26: 4000 mL via INTRAVESICAL

## 2013-05-26 MED ORDER — ROCURONIUM BROMIDE 100 MG/10ML IV SOLN
INTRAVENOUS | Status: AC
  Filled 2013-05-26: qty 1

## 2013-05-26 MED ORDER — NITROFURANTOIN MONOHYD MACRO 100 MG PO CAPS: 100.0000 mg | ORAL_CAPSULE | Freq: Two times a day (BID) | ORAL | Status: AC

## 2013-05-26 MED ORDER — FENTANYL CITRATE 0.05 MG/ML IJ SOLN
25.0000 ug | INTRAMUSCULAR | Status: DC | PRN
  Administered 2013-05-26 (×2): 25 ug via INTRAVENOUS

## 2013-05-26 MED ORDER — NEOSTIGMINE METHYLSULFATE 1 MG/ML IJ SOLN
INTRAMUSCULAR | Status: AC
  Filled 2013-05-26: qty 10

## 2013-05-26 MED ORDER — ONDANSETRON HCL 4 MG/2ML IJ SOLN
INTRAMUSCULAR | Status: DC | PRN
  Administered 2013-05-26: 4 mg via INTRAVENOUS

## 2013-05-26 MED ORDER — SUCCINYLCHOLINE CHLORIDE 20 MG/ML IJ SOLN
INTRAMUSCULAR | Status: DC | PRN
  Administered 2013-05-26: 140 mg via INTRAVENOUS

## 2013-05-26 MED ORDER — LIDOCAINE HCL 2 % EX GEL
CUTANEOUS | Status: AC
  Filled 2013-05-26: qty 10

## 2013-05-26 MED ORDER — GLYCOPYRROLATE 0.2 MG/ML IJ SOLN
INTRAMUSCULAR | Status: DC | PRN
  Administered 2013-05-26: 0.6 mg via INTRAVENOUS

## 2013-05-26 MED ORDER — PROMETHAZINE HCL 25 MG/ML IJ SOLN
INTRAMUSCULAR | Status: AC
  Filled 2013-05-26: qty 1

## 2013-05-26 MED ORDER — EPHEDRINE SULFATE 50 MG/ML IJ SOLN
INTRAMUSCULAR | Status: DC | PRN
  Administered 2013-05-26: 10 mg via INTRAVENOUS

## 2013-05-26 MED ORDER — LIDOCAINE HCL (CARDIAC) 20 MG/ML IV SOLN
INTRAVENOUS | Status: DC | PRN
  Administered 2013-05-26: 100 mg via INTRAVENOUS

## 2013-05-26 MED ORDER — PHENYLEPHRINE HCL 10 MG/ML IJ SOLN
INTRAMUSCULAR | Status: DC | PRN
  Administered 2013-05-26: 80 ug via INTRAVENOUS
  Administered 2013-05-26: 40 ug via INTRAVENOUS
  Administered 2013-05-26 (×2): 80 ug via INTRAVENOUS
  Administered 2013-05-26 (×2): 40 ug via INTRAVENOUS
  Administered 2013-05-26 (×3): 80 ug via INTRAVENOUS

## 2013-05-26 MED ORDER — SODIUM CHLORIDE 0.9 % IV SOLN
1000.0000 mg | INTRAVENOUS | Status: DC | PRN
  Administered 2013-05-26: 1000 mg via INTRAVENOUS

## 2013-05-26 MED ORDER — PROPOFOL 10 MG/ML IV BOLUS
INTRAVENOUS | Status: DC | PRN
  Administered 2013-05-26: 170 mg via INTRAVENOUS

## 2013-05-26 MED ORDER — CIPROFLOXACIN IN D5W 400 MG/200ML IV SOLN
400.0000 mg | INTRAVENOUS | Status: AC
  Administered 2013-05-26: 400 mg via INTRAVENOUS

## 2013-05-26 MED ORDER — PROPOFOL 10 MG/ML IV BOLUS
INTRAVENOUS | Status: AC
  Filled 2013-05-26: qty 20

## 2013-05-26 MED ORDER — LACTATED RINGERS IV SOLN
INTRAVENOUS | Status: DC
  Administered 2013-05-26: 14:00:00 via INTRAVENOUS
  Administered 2013-05-26: 1000 mL via INTRAVENOUS

## 2013-05-26 SURGICAL SUPPLY — 17 items
BAG URO CATCHER STRL LF (DRAPE) ×2 IMPLANT
BASKET ZERO TIP NITINOL 2.4FR (BASKET) ×1 IMPLANT
BSKT STON RTRVL ZERO TP 2.4FR (BASKET) ×1
CATH URET 5FR 28IN CONE TIP (BALLOONS)
CATH URET 5FR 28IN OPEN ENDED (CATHETERS) ×2 IMPLANT
CATH URET 5FR 70CM CONE TIP (BALLOONS) ×1 IMPLANT
CLOTH BEACON ORANGE TIMEOUT ST (SAFETY) ×2 IMPLANT
DRAPE CAMERA CLOSED 9X96 (DRAPES) ×2 IMPLANT
GLOVE BIOGEL M STRL SZ7.5 (GLOVE) ×8 IMPLANT
GOWN STRL REUS W/TWL XL LVL3 (GOWN DISPOSABLE) ×5 IMPLANT
GUIDEWIRE STR DUAL SENSOR (WIRE) ×3 IMPLANT
MANIFOLD NEPTUNE II (INSTRUMENTS) ×2 IMPLANT
PACK CYSTO (CUSTOM PROCEDURE TRAY) ×2 IMPLANT
SHEATH ACCESS URETERAL 38CM (SHEATH) ×1 IMPLANT
STENT CONTOUR 6FRX28X.038 (STENTS) ×2 IMPLANT
TUBING CONNECTING 10 (TUBING) ×2 IMPLANT
WIRE COONS/BENSON .038X145CM (WIRE) IMPLANT

## 2013-05-26 NOTE — Discharge Instructions (Signed)
DISCHARGE INSTRUCTIONS FOR KIDNEY STONES OR URETERAL STENT   MEDICATIONS:  1. DO NOT RESUME YOUR ASPIRIN, or any other medicines like ibuprofen, motrin, excedrin, advil, aleve, vitamin E, fish oil as these can all cause bleeding x 7 days.  2. Resume all your other meds from home - except do not take any other pain meds that you may have at home.  3. Take Nitrofurantoin twice daily for 5 days, starting today.  ACTIVITY:  1. No strenuous activity x 1week  2. No driving while on narcotic pain medications  3. Drink plenty of water  4. Continue to walk at home - you can still get blood clots when you are at home, so keep active, but don't over do it.  5. May return to work in 3 days.   BATHING:  1. You can shower and we recommend daily showers  2. If you have a string coming from your urethra: The stent string is attached to your ureteral stent. Do not pull on this.   SIGNS/SYMPTOMS TO CALL:  Please call us if you have a fever greater than 101.5, uncontrolled nausea/vomiting, uncontrolled pain, dizziness, unable to urinate, bloody urine, chest pain, shortness of breath, leg swelling, leg pain, redness around wound, drainage from wound, or any other concerns or questions.   You can reach us at [phone].   FOLLOW-UP:  1. You have an appointment in 6 weeks with a ultrasound of your kidneys prior.  2. If you have a string attached to your stent, you may remove it on Wednesday, January 21st in the AM. To do this, pull the strings until the stents are completely removed. You may feel an odd sensation in your back.

## 2013-05-26 NOTE — Preoperative (Signed)
Beta Blockers   Reason not to administer Beta Blockers:Not Applicable 

## 2013-05-26 NOTE — Op Note (Signed)
Preoperative diagnosis: right ureteral calculus  Postoperative diagnosis: right renal pelvis calculus, bladder stone, right midureteral stricture  Procedure:  1. Cystoscopy 2. Cystolitholapaxy less than 2 cm 3. right ureteroscopy and renal pelvis stone removal 4. Right ureteral wall biopsy 5. right 38F x 28 cm ureteral stent placement  6. right retrograde pyelography with interpretation  Surgeon: Crist FatBenjamin W. Alayah Knouff, MD  Anesthesia: General  Complications: None  Intraoperative findings: The obstructing ureteral stone was found sitting in the bladder, this was quite a large stone has passed his ureter. right retrograde pyelography demonstrated a narrowing within the right ureter and proximal hydroureteronephrosis. There were no appreciable filling defects. The narrowed area had a significant amount of what appeared to be fibrinous tissue, this may have been from long-standing impacted urolithiasis, a biopsy was taken from this area. The large stones in the right renal pelvis were basketed and removed, there were several smaller ones that were small enough to pass on room.  EBL: Minimal  Specimens: 1. right bladder and renal pelvis calculi 2. Right ureteral wall biopsy  Disposition of specimens: [facility] for stone analysis  Indication: Shaun Pugh is a 63 y.o.   patient with urolithiasis. After reviewing the management options for treatment, the patient elected to proceed with the above surgical procedure(s). We have discussed the potential benefits and risks of the procedure, side effects of the proposed treatment, the likelihood of the patient achieving the goals of the procedure, and any potential problems that might occur during the procedure or recuperation. Informed consent has been obtained.  Description of procedure:  The patient was taken to the operating room and general anesthesia was induced.  The patient was placed in the dorsal lithotomy position,  prepped and draped in the usual sterile fashion, and preoperative antibiotics were administered. A preoperative time-out was performed.   Cystourethroscopy was performed.  The patient's urethra was examined and demonstrated bilobar prostatic hypertrophy. The bladder was then systematically examined in its entirety, and a stone was noted. This was evacuated through the 22 French cystoscopic sheath without fragmentation. There was no evidence for any bladder tumors or other mucosal pathology.    Attention then turned to the right ureteral orifice and a ureteral catheter was used to intubate the ureteral orifice.  Omnipaque contrast was injected through the ureteral catheter and a retrograde pyelogram was performed with findings as dictated above.  A 0.38 sensor guidewire was then advanced up the right ureter into the renal pelvis under fluoroscopic guidance. The 6 Fr semirigid ureteroscope was then advanced into the ureter next to the guidewire and the narrowed segment was identified. I was able to grasp some of the material with a 0 tip Nitinol basket which I sent for a permanent ureteral biopsy. I then passed a second wire through the rigid scope and remove the scope. I then passed the flexible ureteroscope over the wire and into the renal pelvis. Once I was able to empty the renal pelvis I then performed a pyeloscopy under fluoroscopic guidance and identified several more stones, the largest one about 6 mm size. Prior to removing the stones I passed a 35 cm 12/14 ureteral access sheath. Using the access sheath and the flexible ureteroscope, the stones were all removed with the 0 tip Nitinol basket. No laser fragmentation was necessary.   Reinspection of the ureter revealed no remaining visible stones or fragments.   The wire was then backloaded through the cystoscope and a ureteral stent was advance over the wire using  Seldinger technique.  The stent was positioned appropriately under fluoroscopic and  cystoscopic guidance.  The wire was then removed with an adequate stent curl noted in the renal pelvis as well as in the bladder. The stent tether was left on and secured to the dorsum of the penis.  The bladder was then emptied and the procedure ended.  The patient appeared to tolerate the procedure well and without complications.  The patient was able to be awakened and transferred to the recovery unit in satisfactory condition.    Disposition: The patient will be discharged home with instructions to remove his stent in 5 days. He will be placed on nitrofurantoin 100 mg twice daily x5 days to cover his previously cultured staff aureus UTI. He will followup in our office in 6 weeks for renal ultrasound.

## 2013-05-26 NOTE — Anesthesia Preprocedure Evaluation (Addendum)
Anesthesia Evaluation  Patient identified by MRN, date of birth, ID band Patient awake    Reviewed: Allergy & Precautions, H&P , NPO status , Patient's Chart, lab work & pertinent test results  Airway Mallampati: II TM Distance: >3 FB Neck ROM: full    Dental  (+) Edentulous Upper and Edentulous Lower   Pulmonary sleep apnea , former smoker,  breath sounds clear to auscultation  Pulmonary exam normal       Cardiovascular Exercise Tolerance: Good hypertension, Pt. on medications + CAD, + Past MI and + Cardiac Stents Rhythm:regular Rate:Normal  Stents 10 years ago   Neuro/Psych Seizures -,  CVA 11/13 TIACVA, Residual Symptoms negative psych ROS   GI/Hepatic negative GI ROS, Neg liver ROS, Acute pancreatitis 2013   Endo/Other  negative endocrine ROS  Renal/GU negative Renal ROS  negative genitourinary   Musculoskeletal   Abdominal   Peds  Hematology negative hematology ROS (+)   Anesthesia Other Findings   Reproductive/Obstetrics negative OB ROS                       Anesthesia Physical Anesthesia Plan  ASA: III  Anesthesia Plan: General   Post-op Pain Management:    Induction: Intravenous  Airway Management Planned: Oral ETT  Additional Equipment:   Intra-op Plan:   Post-operative Plan: Extubation in OR  Informed Consent: I have reviewed the patients History and Physical, chart, labs and discussed the procedure including the risks, benefits and alternatives for the proposed anesthesia with the patient or authorized representative who has indicated his/her understanding and acceptance.   Dental Advisory Given  Plan Discussed with: CRNA and Surgeon  Anesthesia Plan Comments:        Anesthesia Quick Evaluation

## 2013-05-26 NOTE — Anesthesia Postprocedure Evaluation (Signed)
  Anesthesia Post-op Note  Patient: Shaun Pugh  Procedure(s) Performed: Procedure(s) (LRB): CYSTOSCOPY, RIGHT RETROGRADE PYELOGRAM, RIGHT URETEROSCOPY AND RIGHT STENT PLACEMENT, stone extraction, biopsy (Right)  Patient Location: PACU  Anesthesia Type: General  Level of Consciousness: awake and alert   Airway and Oxygen Therapy: Patient Spontanous Breathing  Post-op Pain: mild  Post-op Assessment: Post-op Vital signs reviewed, Patient's Cardiovascular Status Stable, Respiratory Function Stable, Patent Airway and No signs of Nausea or vomiting  Last Vitals:  Filed Vitals:   05/26/13 1514  BP: 144/71  Pulse: 66  Temp: 36.4 C  Resp: 16    Post-op Vital Signs: stable   Complications: No apparent anesthesia complications

## 2013-05-26 NOTE — H&P (Signed)
Reason For Visit Right mid-ureteral stone   Active Problems Problems  1. Incomplete bladder emptying (788.21) 2. Nephrolithiasis (592.0) 3. Nocturia (788.43) 4. Weak urinary stream (788.62)  History of Present Illness This is a 63 year old male referred by Dr. Roselee Nova for evaluation and management of right native ureteral stone. The patient has a history of stroke with left hemiparesis in 2013. At that time was also noted to have right renal stones. The patient presented to the emergency department proximally 3 days ago with severe right-sided a large nature ureteral stone. He was discharged on medical therapy and antibiotics. Then seen by Dr. Jacquelyne Balint who ordered a KUB, the stone was not immediately recognizable on the KUB secondary to bowel gas and patient's obesity. The patient had mild discomfort, but no significant renal call symptoms. Afebrile.    The patient has a host of comorbidities including history of CVA with hemiparesis, obesity on Plavix.     Interval: The patient presents today in significant discomfort. He has had emesis today as well. He was otherwise doing well. Denies any fevers or chills. Has not had any noticeable gross hematuria. He continues on Rapaflo, Percocet, and Zofran. Patient eager to have stone removed.    Patient has a history of MI, has 2 stents placed. This was approximately 10 years ago. He is on Plavix for A. fib and had a stroke 2 years ago. The patient has had multiple general anesthesia and has tolerated them all very well.   Past Medical History Problems  1. History of Cardiac Arrest (427.5) 2. History of hypercholesterolemia (V12.29) 3. History of hypertension (V12.59) 4. History of Stroke syndrome (436)  Surgical History Problems  1. History of Back Surgery 2. History of Gallbladder Surgery  Current Meds 1. Crestor 10 MG Oral Tablet;  Therapy: (Recorded:31Dec2013) to Recorded 2. Flomax CAPS;  Therapy: (Recorded:31Dec2013)  to Recorded 3. Hydrochlorothiazide 25 MG Oral Tablet;  Therapy: (Recorded:31Dec2013) to Recorded 4. Plavix 75 MG Oral Tablet;  Therapy: (Recorded:31Dec2013) to Recorded 5. Potassium TABS;  Therapy: (Recorded:31Dec2013) to Recorded  Allergies Medication  1. No Known Drug Allergies  Family History Problems  1. Family history of Cardiac Arrest : Father 2. Family history of Death In The Family Father : Father   father passed @ age 64heart attack 3. Family history of Death In The Family Mother : Mother   mother passed @ age 85unknown 43. Family history of Diabetes Mellitus (V18.0) : Father 5. Family history of Family Health Status Number Of Children   1 son3 daughters 6. Family history of Hypertension (V17.49) : Father 7. Family history of Hypertension (V17.49) : Mother  Social History Problems  1. Denied: History of Alcohol Use 2. Caffeine Use   2 drinks daily 3. History of tobacco use (V15.82)   smoked one pack dailyquit smoking 2 months ago 4. Marital History - Currently Married 5. Occupation: Retired  Research scientist (life sciences) Vital Signs [Data Includes: Last 1 Day]  Recorded: 13Jan2015 12:14PM  Blood Pressure: 116 / 72 Heart Rate: 94 Recorded: 12Jan2015 11:44AM  Blood Pressure: 131 / 79 Temperature: 97.5 F Heart Rate: 97  Physical Exam Constitutional: Well nourished and well developed . Mild distress.  Neck: The appearance of the neck is normal and no neck mass is present.  Pulmonary: No respiratory distress, normal respiratory rhythm and effort and clear bilateral breath sounds.  Cardiovascular: Heart rate and rhythm are normal.  Neuro/Psych:. Mood and affect are appropriate.    Results/Data CT on 05/20/13, stone protocol: I independently reviewed  the images.  Moderate to severe right hydroureteronephrosis with mild perinephric  stranding secondary to 10 x 7 mm calculus in distal right ureter.  Right nephrolithiasis is noted as well.       Assessment Symptomatic  right distal obstructing ureteral stone without evidence of infection.   Plan Nephrolithiasis  1. Follow-up Schedule Surgery Office  Follow-up  Status: Hold For - Appointment   Requested for: 13Jan2015  Discussion/Summary Patient would like to have this removed ASAP. He is in significant discomfort. Shockwave lithotripsy is not a viable option given its location and his ongoing need for Plavix. As such, we have discussed ureteroscopy and stent placement. I went over the operation in detail. He understands that there is 20-30% chance of not being able to get into his ureter and as such placing a stent. Otherwise, we will attempt to remove the stone its entirety. He understands that Plavix complicates the issue and may make things more challenging. He also understands that he will have postoperative stent irritation and hematuria. We will get the patient scheduled as soon as possible. Given his significant comorbidities, I will ask Dr. Dwana MelenaZack Hall for preoperative clearance.     Cc Dr. Heath LarkWilliam Pichardo, M.D.  cc: Dr. Dwana MelenaZack Hall, M.D.  cc: Dr. Alfredo MartinezScott MacDiarmid, MD   Signatures Electronically signed by : Berniece SalinesBenjamin Daira Hine, M.D.; May 23 2013 12:56PM EST

## 2013-05-26 NOTE — Transfer of Care (Signed)
Immediate Anesthesia Transfer of Care Note  Patient: Shaun Pugh  Procedure(s) Performed: Procedure(s) (LRB): CYSTOSCOPY, RIGHT RETROGRADE PYELOGRAM, RIGHT URETEROSCOPY AND RIGHT STENT PLACEMENT, stone extraction, biopsy (Right)  Patient Location: PACU  Anesthesia Type: General  Level of Consciousness: sedated, patient cooperative and responds to stimulation  Airway & Oxygen Therapy: Patient Spontanous Breathing and Patient connected to face mask oxgen  Post-op Assessment: Report given to PACU RN and Post -op Vital signs reviewed and stable  Post vital signs: Reviewed and stable  Complications: No apparent anesthesia complications

## 2013-05-29 ENCOUNTER — Encounter (HOSPITAL_COMMUNITY): Payer: Self-pay | Admitting: Urology

## 2013-06-08 ENCOUNTER — Ambulatory Visit: Payer: Medicare Other | Admitting: Neurology

## 2014-01-30 NOTE — Telephone Encounter (Signed)
Close encounter 

## 2015-02-15 ENCOUNTER — Encounter (INDEPENDENT_AMBULATORY_CARE_PROVIDER_SITE_OTHER): Payer: Self-pay | Admitting: *Deleted

## 2015-06-19 DIAGNOSIS — H524 Presbyopia: Secondary | ICD-10-CM | POA: Diagnosis not present

## 2015-07-25 DIAGNOSIS — E782 Mixed hyperlipidemia: Secondary | ICD-10-CM | POA: Diagnosis not present

## 2015-07-25 DIAGNOSIS — E119 Type 2 diabetes mellitus without complications: Secondary | ICD-10-CM | POA: Diagnosis not present

## 2015-07-31 DIAGNOSIS — E119 Type 2 diabetes mellitus without complications: Secondary | ICD-10-CM | POA: Diagnosis not present

## 2015-07-31 DIAGNOSIS — I639 Cerebral infarction, unspecified: Secondary | ICD-10-CM | POA: Diagnosis not present

## 2015-07-31 DIAGNOSIS — E782 Mixed hyperlipidemia: Secondary | ICD-10-CM | POA: Diagnosis not present

## 2015-07-31 DIAGNOSIS — I10 Essential (primary) hypertension: Secondary | ICD-10-CM | POA: Diagnosis not present

## 2015-08-12 DIAGNOSIS — E119 Type 2 diabetes mellitus without complications: Secondary | ICD-10-CM | POA: Diagnosis not present

## 2015-09-20 DIAGNOSIS — R296 Repeated falls: Secondary | ICD-10-CM | POA: Diagnosis not present

## 2015-09-20 DIAGNOSIS — M25561 Pain in right knee: Secondary | ICD-10-CM | POA: Diagnosis not present

## 2015-09-20 DIAGNOSIS — M25562 Pain in left knee: Secondary | ICD-10-CM | POA: Diagnosis not present

## 2015-09-26 ENCOUNTER — Ambulatory Visit (INDEPENDENT_AMBULATORY_CARE_PROVIDER_SITE_OTHER): Payer: Medicare Other | Admitting: Orthopaedic Surgery

## 2015-09-26 ENCOUNTER — Encounter: Payer: Self-pay | Admitting: Orthopaedic Surgery

## 2015-09-26 ENCOUNTER — Ambulatory Visit (INDEPENDENT_AMBULATORY_CARE_PROVIDER_SITE_OTHER): Payer: Medicare Other

## 2015-09-26 VITALS — BP 209/107 | HR 89 | Temp 98.1°F | Ht 73.0 in | Wt 269.0 lb

## 2015-09-26 DIAGNOSIS — G8190 Hemiplegia, unspecified affecting unspecified side: Secondary | ICD-10-CM

## 2015-09-26 DIAGNOSIS — M6281 Muscle weakness (generalized): Secondary | ICD-10-CM

## 2015-09-26 DIAGNOSIS — R279 Unspecified lack of coordination: Secondary | ICD-10-CM | POA: Diagnosis not present

## 2015-09-26 DIAGNOSIS — R262 Difficulty in walking, not elsewhere classified: Secondary | ICD-10-CM | POA: Diagnosis not present

## 2015-09-26 DIAGNOSIS — M25562 Pain in left knee: Secondary | ICD-10-CM | POA: Diagnosis not present

## 2015-09-26 NOTE — Progress Notes (Signed)
Subjective: My left knee gives way.  I have fallen a lot.    Patient ID: Shaun Pugh, male    DOB: July 14, 1950, 65 y.o.   MRN: [id number]  Knee Pain  There was no injury mechanism. The pain is present in the left knee. The quality of the pain is described as aching. The pain is at a severity of 3/10. The pain is mild. The pain has been worsening since onset. Associated symptoms include a loss of motion, muscle weakness and numbness. Pertinent negatives include no inability to bear weight, loss of sensation or tingling. The symptoms are aggravated by weight bearing. He has tried rest for the symptoms. The treatment provided mild relief.   He had a CVA several years ago and has left sided weakness.  It affects his left hand severely with deformity of the hand in flexion and he has weakness of the left shoulder with decreased motion.  His left leg is weak but he can walk fairly well.  He went to [place] recently for a vacation with his wife for a special occasion.  His left knee gave way while there and since then his left knee gives way several times a day.  He has had no injury with the giving way.  He has swelling of the left knee and popping.  Sometimes when he falls he has to have someone come and get him up as his left side is so weak at times he cannot get up.  He saw Dr. Hughie Closs about this May 12th.  I have copies of the notes and have reviewed them.  He has hypertension well controlled.  He has had kidney problems in the past and is being seen for that.  He has sleep apnea.  Review of Systems  HENT: Positive for congestion.   Respiratory: Negative for cough and shortness of breath.   Cardiovascular: Negative for chest pain and leg swelling.  Endocrine: Positive for cold intolerance.  Musculoskeletal: Positive for back pain, joint swelling, arthralgias and gait problem.  Allergic/Immunologic: Positive for environmental allergies.  Neurological: Positive for weakness and numbness.  Negative for tingling.   Past Medical History  Diagnosis Date  . High cholesterol   . Atrophy of right kidney 09/15/2011  . Kidney calculi 09/15/2011  . Acute pancreatitis 09/14/2011  . Chronic back pain   . TIA (transient ischemic attack)   . Stented coronary artery approx 2004  . CAD (coronary artery disease)     HX OF TWO HEART STENTS PLACED APPROX 10 OR 11 YRS AGO- STATES RELEASED BY CARDIOLOGIST- NO LONGER SEES  . Myocardial infarction (HCC)   . Hypertension   . Sleep apnea     STATES TRIED CPAP - DID NOT TOLERATE - RETURNED CPAP - DOES NOT HAVE ANYMORE  . Stroke (HCC) 03/18/2012    LEFT SIDED WEAKNESS - STILL HAS SOME WEAKNESS - ABLE TO AMBULATE WITH OR WITHOUT CANE BUT DOES NOT HAVE USE OF LEFT ARM  . Seizures (HCC)     AFTER THE STROKE - BUT NONE SINCE    Past Surgical History  Procedure Laterality Date  . Cholecystectomy  09/25/2011    Procedure: LAPAROSCOPIC CHOLECYSTECTOMY;  Surgeon: Fabio Bering, MD;  Location: [facility];  Service: General;  Laterality: N/A;  . Back surgery      LUMBAR SURGERIES x 4 - INCLUDING FUSIONS  . Carpal tunnel repair Left   . Hernia repair      INGUINAL HERNIA REPARI MAYBE 30 YRS  AGO  . Multiple tooth extractions    . Cystoscopy with retrograde pyelogram, ureteroscopy and stent placement Right 05/26/2013    Procedure: CYSTOSCOPY, RIGHT RETROGRADE PYELOGRAM, RIGHT URETEROSCOPY AND RIGHT STENT PLACEMENT, stone extraction, biopsy;  Surgeon: Crist FatBenjamin W Herrick, MD;  Location: [facility];  Service: Urology;  Laterality: Right;    No current outpatient prescriptions on file prior to visit.   No current facility-administered medications on file prior to visit.    Social History   Social History  . Marital Status: Married    Spouse Name: N/A  . Number of Children: N/A  . Years of Education: N/A   Occupational History  . Not on file.   Social History Main Topics  . Smoking status: Former Smoker -- 1.00 packs/day for 45 years    Types: Cigarettes      Quit date: 03/18/2012  . Smokeless tobacco: Never Used  . Alcohol Use: No  . Drug Use: No  . Sexual Activity: Yes    Birth Control/ Protection: None   Other Topics Concern  . Not on file   Social History Narrative    BP 209/107 mmHg  Pulse 89  Temp(Src) 98.1 F (36.7 C)  Ht 6\' 1"  (1.854 m)  Wt 269 lb (122.018 kg)  BMI 35.50 kg/m2     Objective:   Physical Exam  Constitutional: He is oriented to person, place, and time. He appears well-developed and well-nourished.  HENT:  Head: Normocephalic and atraumatic.  Eyes: Conjunctivae and EOM are normal. Pupils are equal, round, and reactive to light.  Neck: Normal range of motion. Neck supple.  Cardiovascular: Normal rate, regular rhythm and intact distal pulses.   Pulmonary/Chest: Effort normal.  Abdominal: Soft.  Musculoskeletal: He exhibits tenderness (Left knee is tender with effusion.  ROM 0 to 100.  Weakness of the left lower leg, 4 of 5.  Limp to the left.  Knee with positive medial McMurray.).       Left knee: He exhibits decreased range of motion and effusion. Tenderness found. Medial joint line tenderness noted.       Legs: Neurological: He is alert and oriented to person, place, and time. No cranial nerve deficit. He exhibits abnormal muscle tone. Coordination abnormal.  He has left sided weakness with more pronounced in the upper extremity with flexion deformity of the left hand and decreased motion of the left shoulder.  Limp to the left.  Skin: Skin is warm and dry.  Psychiatric: He has a normal mood and affect. His behavior is normal. Judgment and thought content normal.          Assessment & Plan:   Encounter Diagnoses  Name Primary?  . Left knee pain Yes  . Hemiplegia affecting nondominant side (HCC)   . Lack of coordination   . Difficulty walking   . Muscle weakness (generalized)     I am concerned about a medial meniscus tear.  He has medial joint line pain, giving way, instability of the knee.   He has weakness on the left side secondary to old CVA.  I have ordered a MRI of the knee.  PROCEDURE NOTE:  The patient requests injections of the left knee , verbal consent was obtained.  The left knee was prepped appropriately after time out was performed.   Sterile technique was observed and injection of 1 cc of Depo-Medrol 40 mg with several cc's of plain xylocaine. Anesthesia was provided by ethyl chloride and a 20-gauge needle was used to inject  the knee area. The injection was tolerated well.  A band aid dressing was applied.  The patient was advised to apply ice later today and tomorrow to the injection sight as needed.  I will see him after the MRI or in two weeks if the MRI is denied.  Precautions given.  He should get a cane or crutch for the right side.

## 2015-10-02 ENCOUNTER — Other Ambulatory Visit: Payer: Self-pay | Admitting: Orthopaedic Surgery

## 2015-10-02 ENCOUNTER — Ambulatory Visit
Admission: RE | Admit: 2015-10-02 | Discharge: 2015-10-02 | Disposition: A | Discharge status: Home / Self Care | Payer: Medicare Other | Source: Ambulatory Visit | Attending: Orthopaedic Surgery | Admitting: Orthopaedic Surgery

## 2015-10-02 DIAGNOSIS — Z77018 Contact with and (suspected) exposure to other hazardous metals: Secondary | ICD-10-CM

## 2015-10-02 DIAGNOSIS — M25562 Pain in left knee: Secondary | ICD-10-CM

## 2015-10-08 ENCOUNTER — Telehealth: Payer: Self-pay | Admitting: Orthopaedic Surgery

## 2015-10-08 NOTE — Telephone Encounter (Signed)
Patient/wife called to relay that his MRI has been [facility], due to patient requiring Xray for any possible foreign bodies of the eye, prior to MRI.  States has been advised to contact our office to make sure pre-cert still effective for the 10/10/15 MRI. PH [phone]

## 2015-10-09 NOTE — Telephone Encounter (Signed)
Shaun Pugh does not expire until 10/25/15

## 2015-10-10 ENCOUNTER — Ambulatory Visit
Admission: RE | Admit: 2015-10-10 | Discharge: 2015-10-10 | Disposition: A | Discharge status: Home / Self Care | Payer: Medicare Other | Source: Ambulatory Visit | Attending: Orthopaedic Surgery | Admitting: Orthopaedic Surgery

## 2015-10-10 ENCOUNTER — Ambulatory Visit: Payer: Self-pay | Admitting: Orthopaedic Surgery

## 2015-10-10 DIAGNOSIS — Z77018 Contact with and (suspected) exposure to other hazardous metals: Secondary | ICD-10-CM

## 2015-10-10 DIAGNOSIS — Z01818 Encounter for other preprocedural examination: Secondary | ICD-10-CM | POA: Diagnosis not present

## 2015-10-10 DIAGNOSIS — M179 Osteoarthritis of knee, unspecified: Secondary | ICD-10-CM | POA: Diagnosis not present

## 2015-10-15 ENCOUNTER — Encounter: Payer: Self-pay | Admitting: Orthopaedic Surgery

## 2015-10-15 ENCOUNTER — Ambulatory Visit (INDEPENDENT_AMBULATORY_CARE_PROVIDER_SITE_OTHER): Payer: Medicare Other | Admitting: Orthopaedic Surgery

## 2015-10-15 VITALS — BP 156/71 | HR 82 | Temp 97.9°F | Resp 16 | Ht 73.0 in | Wt 274.0 lb

## 2015-10-15 DIAGNOSIS — M25562 Pain in left knee: Secondary | ICD-10-CM | POA: Diagnosis not present

## 2015-10-15 DIAGNOSIS — R279 Unspecified lack of coordination: Secondary | ICD-10-CM

## 2015-10-15 DIAGNOSIS — G8190 Hemiplegia, unspecified affecting unspecified side: Secondary | ICD-10-CM

## 2015-10-15 DIAGNOSIS — R262 Difficulty in walking, not elsewhere classified: Secondary | ICD-10-CM | POA: Diagnosis not present

## 2015-10-15 DIAGNOSIS — M6281 Muscle weakness (generalized): Secondary | ICD-10-CM

## 2015-10-15 NOTE — Patient Instructions (Addendum)
MRI results explained.  Continue current medications. Weight reduction discussed. Pool exercises recommended if possible. Continue Voltaren gel. Stoevep the Meloxicam and take one Aleve twice a day.

## 2015-10-15 NOTE — Progress Notes (Signed)
Patient ID:Shaun Pugh, male DOB:01-11-1951, 65 y.o. YQM:[id number]  Chief Complaint  Patient presents with  . Follow-up    review MRI left knee    HPI  Shaun Pugh is a 65 y.o. male he has less pain of the left knee.  He got the MRI and it showed: IMPRESSION: 1. No acute findings or evidence of internal derangement. 2. Mild medial compartment degenerative chondrosis with subchondral cyst formation in the medial femoral condyle.  I went over the findings with him.  He does not need surgery.  He will continue his exercises and try to lose weight.  He has left sided hemiparesis which affects his balance and his walking.   HPI  Body mass index is 36.16 kg/(m^2).  ROS  Review of Systems  HENT: Positive for congestion.   Respiratory: Negative for cough and shortness of breath.   Cardiovascular: Negative for chest pain and leg swelling.  Endocrine: Positive for cold intolerance.  Musculoskeletal: Positive for back pain, joint swelling, arthralgias and gait problem.  Allergic/Immunologic: Positive for environmental allergies.  Neurological: Positive for weakness and numbness.    Past Medical History  Diagnosis Date  . High cholesterol   . Atrophy of right kidney 09/15/2011  . Kidney calculi 09/15/2011  . Acute pancreatitis 09/14/2011  . Chronic back pain   . TIA (transient ischemic attack)   . Stented coronary artery approx 2004  . CAD (coronary artery disease)     HX OF TWO HEART STENTS PLACED APPROX 10 OR 11 YRS AGO- STATES RELEASED BY CARDIOLOGIST- NO LONGER SEES  . Myocardial infarction (HCC)   . Hypertension   . Sleep apnea     STATES TRIED CPAP - DID NOT TOLERATE - RETURNED CPAP - DOES NOT HAVE ANYMORE  . Stroke (HCC) 03/18/2012    LEFT SIDED WEAKNESS - STILL HAS SOME WEAKNESS - ABLE TO AMBULATE WITH OR WITHOUT CANE BUT DOES NOT HAVE USE OF LEFT ARM  . Seizures (HCC)     AFTER THE STROKE - BUT NONE SINCE    Past Surgical History  Procedure Laterality Date  .  Cholecystectomy  09/25/2011    Procedure: LAPAROSCOPIC CHOLECYSTECTOMY;  Surgeon: Fabio BeringBrent C Ziegler, MD;  Location: [facility];  Service: General;  Laterality: N/A;  . Back surgery      LUMBAR SURGERIES x 4 - INCLUDING FUSIONS  . Carpal tunnel repair Left   . Hernia repair      INGUINAL HERNIA REPARI MAYBE 30 YRS AGO  . Multiple tooth extractions    . Cystoscopy with retrograde pyelogram, ureteroscopy and stent placement Right 05/26/2013    Procedure: CYSTOSCOPY, RIGHT RETROGRADE PYELOGRAM, RIGHT URETEROSCOPY AND RIGHT STENT PLACEMENT, stone extraction, biopsy;  Surgeon: Crist FatBenjamin W Herrick, MD;  Location: [facility];  Service: Urology;  Laterality: Right;    Family History  Problem Relation Age of Onset  . Anesthesia problems Neg Hx   . Hypotension Neg Hx   . Malignant hyperthermia Neg Hx   . Pseudochol deficiency Neg Hx   . Congestive Heart Failure Mother   . Diabetes Father   . Heart attack Father     Social History Social History  Substance Use Topics  . Smoking status: Former Smoker -- 1.00 packs/day for 45 years    Types: Cigarettes    Quit date: 03/18/2012  . Smokeless tobacco: Never Used  . Alcohol Use: No    No Known Allergies  Current Outpatient Prescriptions  Medication Sig Dispense Refill  . clobetasol cream (TEMOVATE) 0.05 %  Apply 1 application topically 2 (two) times daily.    . fenofibrate 160 MG tablet Take 160 mg by mouth daily.    . fluticasone (CUTIVATE) 0.05 % cream Apply topically 2 (two) times daily.    Marland Kitchen glipiZIDE (GLUCOTROL XL) 10 MG 24 hr tablet Take 10 mg by mouth daily with breakfast.    . meloxicam (MOBIC) 15 MG tablet Take 15 mg by mouth daily.    . metFORMIN (GLUCOPHAGE) 500 MG tablet Take 500 mg by mouth 2 (two) times daily with a meal.     No current facility-administered medications for this visit.     Physical Exam  Blood pressure 156/71, pulse 82, temperature 97.9 F (36.6 C), resp. rate 16, height  (1.854 m), weight 274 lb (124.286  kg).  Constitutional: overall normal hygiene, normal nutrition, well developed, normal grooming, normal body habitus. Assistive device:none  Musculoskeletal: gait and station Limp left, muscle tone and strength are normal on the right, he has hemiparesis on the left with decreased tone and weakness, no tremors are present.  .  Neurological: coordination overall normal.  Deep tendon reflex/nerve stretch intact.  Sensation normal.  Cranial nerves II-XII intact.   Skin:   normal overall no scars, lesions, ulcers or rashes. No psoriasis.  Psychiatric: Alert and oriented x 3.  Recent memory intact, remote memory unclear.  Normal mood and affect. Well groomed.  Good eye contact.  Cardiovascular: overall no swelling, no varicosities, no edema bilaterally, normal temperatures of the legs and arms, no clubbing, cyanosis and good capillary refill.  Lymphatic: palpation is normal.  His left knee has ROM of 0 to 95 with crepitus, effusion and tenderness.  He has limp to the left.  He has hemiparesis on the left more on the left upper arm.   The patient has been educated about the nature of the problem(s) and counseled on treatment options.  The patient appeared to understand what I have discussed and is in agreement with it.  Encounter Diagnoses  Name Primary?  . Left knee pain Yes  . Hemiplegia affecting nondominant side (HCC)   . Lack of coordination   . Difficulty walking   . Muscle weakness (generalized)     PLAN Call if any problems.  Precautions discussed.  Continue current medications.   Return to clinic 6 weeks   Do exercises.

## 2015-11-26 ENCOUNTER — Ambulatory Visit (INDEPENDENT_AMBULATORY_CARE_PROVIDER_SITE_OTHER): Payer: Medicare Other | Admitting: Orthopaedic Surgery

## 2015-11-26 ENCOUNTER — Encounter: Payer: Self-pay | Admitting: Orthopaedic Surgery

## 2015-11-26 VITALS — BP 174/88 | HR 76 | Temp 97.7°F | Ht 71.0 in | Wt 279.6 lb

## 2015-11-26 DIAGNOSIS — G8190 Hemiplegia, unspecified affecting unspecified side: Secondary | ICD-10-CM

## 2015-11-26 DIAGNOSIS — R262 Difficulty in walking, not elsewhere classified: Secondary | ICD-10-CM | POA: Diagnosis not present

## 2015-11-26 DIAGNOSIS — M25562 Pain in left knee: Secondary | ICD-10-CM

## 2015-11-26 DIAGNOSIS — R279 Unspecified lack of coordination: Secondary | ICD-10-CM | POA: Diagnosis not present

## 2015-11-26 DIAGNOSIS — M6281 Muscle weakness (generalized): Secondary | ICD-10-CM

## 2015-11-26 NOTE — Progress Notes (Signed)
Patient ID:Shaun Pugh, male DOB:1951-04-29, 65 y.o. RUE:[id number]  Chief Complaint  Patient presents with  . Follow-up    Left knee    HPI  Shaun Pugh is a 65 y.o. male who has chronic left knee pain.  His MRI done recently showed mild medial compartment degenerative chondrosis.  He has been taking his Aleve and using his cane.  He has no giving way and no locking.  He has swelling and popping.  He is active.  HPI  Body mass index is 39.01 kg/(m^2).  ROS  Review of Systems  HENT: Positive for congestion.   Respiratory: Negative for cough and shortness of breath.   Cardiovascular: Negative for chest pain and leg swelling.  Endocrine: Positive for cold intolerance.  Musculoskeletal: Positive for back pain, joint swelling, arthralgias and gait problem.  Allergic/Immunologic: Positive for environmental allergies.  Neurological: Positive for weakness and numbness.    Past Medical History  Diagnosis Date  . High cholesterol   . Atrophy of right kidney 09/15/2011  . Kidney calculi 09/15/2011  . Acute pancreatitis 09/14/2011  . Chronic back pain   . TIA (transient ischemic attack)   . Stented coronary artery approx 2004  . CAD (coronary artery disease)     HX OF TWO HEART STENTS PLACED APPROX 10 OR 11 YRS AGO- STATES RELEASED BY CARDIOLOGIST- NO LONGER SEES  . Myocardial infarction (HCC)   . Hypertension   . Sleep apnea     STATES TRIED CPAP - DID NOT TOLERATE - RETURNED CPAP - DOES NOT HAVE ANYMORE  . Stroke (HCC) 03/18/2012    LEFT SIDED WEAKNESS - STILL HAS SOME WEAKNESS - ABLE TO AMBULATE WITH OR WITHOUT CANE BUT DOES NOT HAVE USE OF LEFT ARM  . Seizures (HCC)     AFTER THE STROKE - BUT NONE SINCE    Past Surgical History  Procedure Laterality Date  . Cholecystectomy  09/25/2011    Procedure: LAPAROSCOPIC CHOLECYSTECTOMY;  Surgeon: Fabio BeringBrent C Ziegler, MD;  Location: [facility];  Service: General;  Laterality: N/A;  . Back surgery      LUMBAR SURGERIES x 4 - INCLUDING FUSIONS  .  Carpal tunnel repair Left   . Hernia repair      INGUINAL HERNIA REPARI MAYBE 30 YRS AGO  . Multiple tooth extractions    . Cystoscopy with retrograde pyelogram, ureteroscopy and stent placement Right 05/26/2013    Procedure: CYSTOSCOPY, RIGHT RETROGRADE PYELOGRAM, RIGHT URETEROSCOPY AND RIGHT STENT PLACEMENT, stone extraction, biopsy;  Surgeon: Crist FatBenjamin W Herrick, MD;  Location: [facility];  Service: Urology;  Laterality: Right;    Family History  Problem Relation Age of Onset  . Anesthesia problems Neg Hx   . Hypotension Neg Hx   . Malignant hyperthermia Neg Hx   . Pseudochol deficiency Neg Hx   . Congestive Heart Failure Mother   . Diabetes Father   . Heart attack Father     Social History Social History  Substance Use Topics  . Smoking status: Former Smoker -- 1.00 packs/day for 45 years    Types: Cigarettes    Quit date: 03/18/2012  . Smokeless tobacco: Never Used  . Alcohol Use: No    No Known Allergies  Current Outpatient Prescriptions  Medication Sig Dispense Refill  . clobetasol cream (TEMOVATE) 0.05 % Apply 1 application topically 2 (two) times daily.    . fenofibrate 160 MG tablet Take 160 mg by mouth daily.    . fluticasone (CUTIVATE) 0.05 % cream Apply topically 2 (two)  times daily.    Marland Kitchen glipiZIDE (GLUCOTROL XL) 10 MG 24 hr tablet Take 10 mg by mouth daily with breakfast.    . meloxicam (MOBIC) 15 MG tablet Take 15 mg by mouth daily.    . metFORMIN (GLUCOPHAGE) 500 MG tablet Take 500 mg by mouth 2 (two) times daily with a meal.     No current facility-administered medications for this visit.     Physical Exam  Blood pressure 174/88, pulse 76, temperature 97.7 F (36.5 C), height  (1.803 m), weight 279 lb 9.6 oz (126.826 kg).  Constitutional: overall normal hygiene, normal nutrition, well developed, normal grooming, normal body habitus. Assistive device:cane  Musculoskeletal: gait and station Limp left, muscle tone and strength are normal on the right,  weakness on the left, no tremors or atrophy is present on right, atrophy on left.  .  Neurological: coordination overall abnormal secondary to left sided weakness from old CVA.  Deep tendon reflex/nerve stretch intact.  Sensation normal.  Cranial nerves II-XII intact.   Skin:   normaal overall no scars, lesions, ulcers or rashes. No psoriasis.  Psychiatric: Alert and oriented x 3.  Recent memory intact, remote memory unclear.  Normal mood and affect. Well groomed.  Good eye contact.  Cardiovascular: overall no swelling, no varicosities, no edema bilaterally, normal temperatures of the legs and arms, no clubbing, cyanosis and good capillary refill.  Lymphatic: palpation is normal.  The left lower extremity is examined:  Inspection:  Thigh:  Non-tender and no defects  Knee has swelling 1+ effusion.                        Joint tenderness is present                        Patient is tender over the medial joint line  Lower Leg:  Has normal appearance and no tenderness or defects  Ankle:  Non-tender and no defects  Foot:  Non-tender and no defects Range of Motion:  Knee:  Range of motion is: 0-105                        Crepitus is  present  Ankle:  Range of motion is normal. Strength and Tone:  The left lower extremity has weakness from old CVA and atrophy Stability:  Knee:  The knee is stable.  Ankle:  The ankle is stable.    The patient has been educated about the nature of the problem(s) and counseled on treatment options.  The patient appeared to understand what I have discussed and is in agreement with it.  Encounter Diagnoses  Name Primary?  . Left knee pain Yes  . Hemiplegia affecting nondominant side (HCC)   . Lack of coordination   . Difficulty walking   . Muscle weakness (generalized)     PLAN Call if any problems.  Precautions discussed.  Continue current medications.   Return to clinic 6 weeks   Electronically Signed Darreld Mclean, MD 7/18/20179:21  AM

## 2015-12-12 DIAGNOSIS — E119 Type 2 diabetes mellitus without complications: Secondary | ICD-10-CM | POA: Diagnosis not present

## 2015-12-12 DIAGNOSIS — E782 Mixed hyperlipidemia: Secondary | ICD-10-CM | POA: Diagnosis not present

## 2015-12-16 DIAGNOSIS — E119 Type 2 diabetes mellitus without complications: Secondary | ICD-10-CM | POA: Diagnosis not present

## 2015-12-17 DIAGNOSIS — E6609 Other obesity due to excess calories: Secondary | ICD-10-CM | POA: Diagnosis not present

## 2015-12-17 DIAGNOSIS — I10 Essential (primary) hypertension: Secondary | ICD-10-CM | POA: Diagnosis not present

## 2015-12-17 DIAGNOSIS — R944 Abnormal results of kidney function studies: Secondary | ICD-10-CM | POA: Diagnosis not present

## 2015-12-17 DIAGNOSIS — E119 Type 2 diabetes mellitus without complications: Secondary | ICD-10-CM | POA: Diagnosis not present

## 2015-12-17 DIAGNOSIS — L409 Psoriasis, unspecified: Secondary | ICD-10-CM | POA: Diagnosis not present

## 2015-12-17 DIAGNOSIS — R42 Dizziness and giddiness: Secondary | ICD-10-CM | POA: Diagnosis not present

## 2015-12-17 DIAGNOSIS — E782 Mixed hyperlipidemia: Secondary | ICD-10-CM | POA: Diagnosis not present

## 2015-12-17 DIAGNOSIS — R531 Weakness: Secondary | ICD-10-CM | POA: Diagnosis not present

## 2015-12-17 DIAGNOSIS — H6120 Impacted cerumen, unspecified ear: Secondary | ICD-10-CM | POA: Diagnosis not present

## 2015-12-17 DIAGNOSIS — I639 Cerebral infarction, unspecified: Secondary | ICD-10-CM | POA: Diagnosis not present

## 2015-12-25 DIAGNOSIS — R296 Repeated falls: Secondary | ICD-10-CM | POA: Diagnosis not present

## 2015-12-25 DIAGNOSIS — I2510 Atherosclerotic heart disease of native coronary artery without angina pectoris: Secondary | ICD-10-CM | POA: Diagnosis not present

## 2015-12-25 DIAGNOSIS — R42 Dizziness and giddiness: Secondary | ICD-10-CM | POA: Diagnosis not present

## 2015-12-25 DIAGNOSIS — E119 Type 2 diabetes mellitus without complications: Secondary | ICD-10-CM | POA: Diagnosis not present

## 2015-12-25 DIAGNOSIS — I10 Essential (primary) hypertension: Secondary | ICD-10-CM | POA: Diagnosis not present

## 2015-12-25 DIAGNOSIS — I69354 Hemiplegia and hemiparesis following cerebral infarction affecting left non-dominant side: Secondary | ICD-10-CM | POA: Diagnosis not present

## 2016-01-07 ENCOUNTER — Ambulatory Visit: Payer: Self-pay | Admitting: Orthopaedic Surgery

## 2016-01-14 DIAGNOSIS — K219 Gastro-esophageal reflux disease without esophagitis: Secondary | ICD-10-CM | POA: Diagnosis not present

## 2016-01-14 DIAGNOSIS — F39 Unspecified mood [affective] disorder: Secondary | ICD-10-CM | POA: Diagnosis not present

## 2016-02-11 DIAGNOSIS — I10 Essential (primary) hypertension: Secondary | ICD-10-CM | POA: Diagnosis not present

## 2016-02-11 DIAGNOSIS — M25562 Pain in left knee: Secondary | ICD-10-CM | POA: Diagnosis not present

## 2016-02-11 DIAGNOSIS — F330 Major depressive disorder, recurrent, mild: Secondary | ICD-10-CM | POA: Diagnosis not present

## 2016-02-11 DIAGNOSIS — K219 Gastro-esophageal reflux disease without esophagitis: Secondary | ICD-10-CM | POA: Diagnosis not present

## 2016-02-11 DIAGNOSIS — E119 Type 2 diabetes mellitus without complications: Secondary | ICD-10-CM | POA: Diagnosis not present

## 2016-02-11 DIAGNOSIS — L600 Ingrowing nail: Secondary | ICD-10-CM | POA: Diagnosis not present

## 2016-03-13 ENCOUNTER — Emergency Department (HOSPITAL_COMMUNITY): Payer: Medicare Other

## 2016-03-13 ENCOUNTER — Inpatient Hospital Stay (HOSPITAL_COMMUNITY)
Admission: EM | Admit: 2016-03-13 | Discharge: 2016-03-17 | DRG: 065 | Disposition: A | Discharge status: Home / Self Care | Payer: Medicare Other | Attending: Internal Medicine | Admitting: Internal Medicine

## 2016-03-13 ENCOUNTER — Encounter (HOSPITAL_COMMUNITY): Payer: Self-pay

## 2016-03-13 DIAGNOSIS — I63432 Cerebral infarction due to embolism of left posterior cerebral artery: Principal | ICD-10-CM | POA: Diagnosis present

## 2016-03-13 DIAGNOSIS — I10 Essential (primary) hypertension: Secondary | ICD-10-CM | POA: Diagnosis not present

## 2016-03-13 DIAGNOSIS — Z955 Presence of coronary angioplasty implant and graft: Secondary | ICD-10-CM | POA: Diagnosis not present

## 2016-03-13 DIAGNOSIS — E1159 Type 2 diabetes mellitus with other circulatory complications: Secondary | ICD-10-CM

## 2016-03-13 DIAGNOSIS — G8190 Hemiplegia, unspecified affecting unspecified side: Secondary | ICD-10-CM | POA: Diagnosis not present

## 2016-03-13 DIAGNOSIS — I63332 Cerebral infarction due to thrombosis of left posterior cerebral artery: Secondary | ICD-10-CM | POA: Diagnosis not present

## 2016-03-13 DIAGNOSIS — Z87891 Personal history of nicotine dependence: Secondary | ICD-10-CM | POA: Diagnosis not present

## 2016-03-13 DIAGNOSIS — R51 Headache: Secondary | ICD-10-CM | POA: Diagnosis not present

## 2016-03-13 DIAGNOSIS — Z7982 Long term (current) use of aspirin: Secondary | ICD-10-CM | POA: Diagnosis not present

## 2016-03-13 DIAGNOSIS — I252 Old myocardial infarction: Secondary | ICD-10-CM

## 2016-03-13 DIAGNOSIS — H53461 Homonymous bilateral field defects, right side: Secondary | ICD-10-CM | POA: Diagnosis present

## 2016-03-13 DIAGNOSIS — Z7984 Long term (current) use of oral hypoglycemic drugs: Secondary | ICD-10-CM

## 2016-03-13 DIAGNOSIS — E0822 Diabetes mellitus due to underlying condition with diabetic chronic kidney disease: Secondary | ICD-10-CM | POA: Diagnosis not present

## 2016-03-13 DIAGNOSIS — N319 Neuromuscular dysfunction of bladder, unspecified: Secondary | ICD-10-CM | POA: Diagnosis present

## 2016-03-13 DIAGNOSIS — R339 Retention of urine, unspecified: Secondary | ICD-10-CM | POA: Diagnosis present

## 2016-03-13 DIAGNOSIS — N183 Chronic kidney disease, stage 3 (moderate): Secondary | ICD-10-CM | POA: Diagnosis not present

## 2016-03-13 DIAGNOSIS — E7800 Pure hypercholesterolemia, unspecified: Secondary | ICD-10-CM | POA: Diagnosis present

## 2016-03-13 DIAGNOSIS — Z8249 Family history of ischemic heart disease and other diseases of the circulatory system: Secondary | ICD-10-CM

## 2016-03-13 DIAGNOSIS — I69354 Hemiplegia and hemiparesis following cerebral infarction affecting left non-dominant side: Secondary | ICD-10-CM | POA: Diagnosis not present

## 2016-03-13 DIAGNOSIS — N3000 Acute cystitis without hematuria: Secondary | ICD-10-CM | POA: Diagnosis not present

## 2016-03-13 DIAGNOSIS — H539 Unspecified visual disturbance: Secondary | ICD-10-CM | POA: Diagnosis not present

## 2016-03-13 DIAGNOSIS — I639 Cerebral infarction, unspecified: Secondary | ICD-10-CM | POA: Diagnosis not present

## 2016-03-13 DIAGNOSIS — G459 Transient cerebral ischemic attack, unspecified: Secondary | ICD-10-CM | POA: Diagnosis not present

## 2016-03-13 DIAGNOSIS — E7849 Other hyperlipidemia: Secondary | ICD-10-CM | POA: Diagnosis present

## 2016-03-13 DIAGNOSIS — Z66 Do not resuscitate: Secondary | ICD-10-CM | POA: Diagnosis present

## 2016-03-13 DIAGNOSIS — E118 Type 2 diabetes mellitus with unspecified complications: Secondary | ICD-10-CM | POA: Diagnosis not present

## 2016-03-13 DIAGNOSIS — I6622 Occlusion and stenosis of left posterior cerebral artery: Secondary | ICD-10-CM | POA: Diagnosis not present

## 2016-03-13 DIAGNOSIS — I2510 Atherosclerotic heart disease of native coronary artery without angina pectoris: Secondary | ICD-10-CM | POA: Diagnosis present

## 2016-03-13 DIAGNOSIS — Z833 Family history of diabetes mellitus: Secondary | ICD-10-CM | POA: Diagnosis not present

## 2016-03-13 DIAGNOSIS — E1151 Type 2 diabetes mellitus with diabetic peripheral angiopathy without gangrene: Secondary | ICD-10-CM | POA: Diagnosis present

## 2016-03-13 DIAGNOSIS — E1122 Type 2 diabetes mellitus with diabetic chronic kidney disease: Secondary | ICD-10-CM | POA: Diagnosis present

## 2016-03-13 DIAGNOSIS — E119 Type 2 diabetes mellitus without complications: Secondary | ICD-10-CM

## 2016-03-13 DIAGNOSIS — I129 Hypertensive chronic kidney disease with stage 1 through stage 4 chronic kidney disease, or unspecified chronic kidney disease: Secondary | ICD-10-CM | POA: Diagnosis not present

## 2016-03-13 DIAGNOSIS — H538 Other visual disturbances: Secondary | ICD-10-CM | POA: Diagnosis not present

## 2016-03-13 DIAGNOSIS — E785 Hyperlipidemia, unspecified: Secondary | ICD-10-CM | POA: Diagnosis not present

## 2016-03-13 DIAGNOSIS — G4733 Obstructive sleep apnea (adult) (pediatric): Secondary | ICD-10-CM | POA: Diagnosis present

## 2016-03-13 DIAGNOSIS — Z794 Long term (current) use of insulin: Secondary | ICD-10-CM

## 2016-03-13 DIAGNOSIS — I63231 Cerebral infarction due to unspecified occlusion or stenosis of right carotid arteries: Secondary | ICD-10-CM | POA: Diagnosis not present

## 2016-03-13 DIAGNOSIS — E784 Other hyperlipidemia: Secondary | ICD-10-CM | POA: Diagnosis not present

## 2016-03-13 LAB — DIFFERENTIAL
BASOS ABS: 0 10*3/uL (ref 0.0–0.1)
Basophils Relative: 0 %
EOS PCT: 2 %
Eosinophils Absolute: 0.3 10*3/uL (ref 0.0–0.7)
LYMPHS ABS: 4.1 10*3/uL — AB (ref 0.7–4.0)
Lymphocytes Relative: 26 %
MONO ABS: 1.6 10*3/uL — AB (ref 0.1–1.0)
Monocytes Relative: 10 %
NEUTROS ABS: 9.8 10*3/uL — AB (ref 1.7–7.7)
NEUTROS PCT: 62 %

## 2016-03-13 LAB — URINALYSIS, ROUTINE W REFLEX MICROSCOPIC
Bilirubin Urine: NEGATIVE
GLUCOSE, UA: NEGATIVE mg/dL
HGB URINE DIPSTICK: NEGATIVE
Ketones, ur: NEGATIVE mg/dL
Leukocytes, UA: NEGATIVE
Nitrite: NEGATIVE
PH: 6 (ref 5.0–8.0)
PROTEIN: NEGATIVE mg/dL
Specific Gravity, Urine: 1.01 (ref 1.005–1.030)

## 2016-03-13 LAB — I-STAT CHEM 8, ED
BUN: 21 mg/dL — ABNORMAL HIGH (ref 6–20)
CREATININE: 1.5 mg/dL — AB (ref 0.61–1.24)
Calcium, Ion: 1.21 mmol/L (ref 1.15–1.40)
Chloride: 99 mmol/L — ABNORMAL LOW (ref 101–111)
Glucose, Bld: 119 mg/dL — ABNORMAL HIGH (ref 65–99)
HEMATOCRIT: 45 % (ref 39.0–52.0)
HEMOGLOBIN: 15.3 g/dL (ref 13.0–17.0)
POTASSIUM: 4 mmol/L (ref 3.5–5.1)
SODIUM: 139 mmol/L (ref 135–145)
TCO2: 26 mmol/L (ref 0–100)

## 2016-03-13 LAB — COMPREHENSIVE METABOLIC PANEL
ALBUMIN: 4.2 g/dL (ref 3.5–5.0)
ALT: 33 U/L (ref 17–63)
ANION GAP: 8 (ref 5–15)
AST: 23 U/L (ref 15–41)
Alkaline Phosphatase: 81 U/L (ref 38–126)
BILIRUBIN TOTAL: 0.5 mg/dL (ref 0.3–1.2)
BUN: 21 mg/dL — ABNORMAL HIGH (ref 6–20)
CALCIUM: 9.4 mg/dL (ref 8.9–10.3)
CO2: 26 mmol/L (ref 22–32)
Chloride: 99 mmol/L — ABNORMAL LOW (ref 101–111)
Creatinine, Ser: 1.43 mg/dL — ABNORMAL HIGH (ref 0.61–1.24)
GFR calc non Af Amer: 50 mL/min — ABNORMAL LOW (ref >60)
GFR, EST AFRICAN AMERICAN: 57 mL/min — AB (ref >60)
GLUCOSE: 119 mg/dL — AB (ref 65–99)
POTASSIUM: 3.9 mmol/L (ref 3.5–5.1)
Sodium: 133 mmol/L — ABNORMAL LOW (ref 135–145)
TOTAL PROTEIN: 8.4 g/dL — AB (ref 6.5–8.1)

## 2016-03-13 LAB — CBG MONITORING, ED: GLUCOSE-CAPILLARY: 134 mg/dL — AB (ref 65–99)

## 2016-03-13 LAB — TROPONIN I

## 2016-03-13 LAB — CBC
HEMATOCRIT: 43.8 % (ref 39.0–52.0)
Hemoglobin: 15.5 g/dL (ref 13.0–17.0)
MCH: 30.5 pg (ref 26.0–34.0)
MCHC: 35.4 g/dL (ref 30.0–36.0)
MCV: 86.2 fL (ref 78.0–100.0)
Platelets: 287 10*3/uL (ref 150–400)
RBC: 5.08 MIL/uL (ref 4.22–5.81)
RDW: 13.4 % (ref 11.5–15.5)
WBC: 15.8 10*3/uL — ABNORMAL HIGH (ref 4.0–10.5)

## 2016-03-13 LAB — PROTIME-INR
INR: 0.92
Prothrombin Time: 12.4 seconds (ref 11.4–15.2)

## 2016-03-13 LAB — APTT: APTT: 26 s (ref 24–36)

## 2016-03-13 MED ORDER — SODIUM CHLORIDE 0.9 % IV BOLUS (SEPSIS)
1000.0000 mL | Freq: Once | INTRAVENOUS | Status: AC
  Administered 2016-03-13: 1000 mL via INTRAVENOUS

## 2016-03-13 MED ORDER — ATORVASTATIN CALCIUM 40 MG PO TABS
40.0000 mg | ORAL_TABLET | Freq: Every day | ORAL | Status: DC
  Administered 2016-03-14 (×2): 40 mg via ORAL
  Filled 2016-03-13 (×3): qty 1

## 2016-03-13 NOTE — H&P (Signed)
History and Physical    Shaun Pugh WUJ:[id number] DOB: June 27, 1950 DOA: 03/13/2016  PCP: [name], MD Consultants:  None Patient coming from: home - lives with wife; NOK: wife, [phone]  Chief Complaint: vision loss  HPI: Shaun Pugh is a 65 y.o. male with medical history significant of CVA affecting vision prior without pain behind the left eye but resulting in some long-term vision loss and peripheral vision loss on the left;  CVA resulting in left-sided hemiparesis of the left upper > lower extremity; OSA (unable to tolerate CPAP); DM, HTN, HLD, and CAD presenting because he "can't see."  Started this afternoon.  Awoke from a nap with the problem.  Sees light but no color - everything is in black and white.  Eyes are both affected.  Initially clear, then gets blurry, then disappears.  Some peripheral vision is preserved.  Pain behind the left eye, described as a headache.  Came on spontaneously, affects both eyes symmetrically.  No h/o similar.  No typical stroke symptoms (dysphagia, dysarthria, change in motor weakness/numbness).  No h/o glaucoma.     ED Course:  Per Dr. Verdie Mosher: 65 year old male who presents with blurry vision since 1 PM yesterday. Is not within the TPA window. On exam, unable to count fingers up close on visual acuity testing. This is new for him. Subtle numbness over left face, but remainder of neuro exam in tact. CT head shows a hypodense lesion in the left occipital region, new since 2013, but question subacute versus old by radiology. Discussed with Dr. Roseanne Reno from neurology. He is recommending transfer to [facility] for hospitalist admission and MRI. Spoke with Dr. Ophelia Charter who will arrange admission at [facility].  Review of Systems: As per HPI; otherwise 10 point review of systems reviewed and negative.   Ambulatory Status:  Ambulates with a cane  Past Medical History:  Diagnosis Date  . Acute pancreatitis 09/14/2011  . Atrophy of right kidney 09/15/2011  . CAD  (coronary artery disease)    HX OF TWO HEART STENTS PLACED APPROX 10 OR 11 YRS AGO- STATES RELEASED BY CARDIOLOGIST- NO LONGER SEES  . Chronic back pain   . Diabetes (HCC)   . High cholesterol   . Hypertension   . Kidney calculi 09/15/2011  . Myocardial infarction   . Seizures (HCC)    AFTER THE STROKE - BUT NONE SINCE  . Sleep apnea    STATES TRIED CPAP - DID NOT TOLERATE - RETURNED CPAP - DOES NOT HAVE ANYMORE  . Stented coronary artery approx 2004  . Stroke (HCC) 03/18/2012   LEFT SIDED WEAKNESS - STILL HAS SOME LUE>LLE WEAKNESS - ABLE TO AMBULATE WITH OR WITHOUT CANE BUT DOES NOT HAVE USE OF LEFT ARM  . TIA (transient ischemic attack)     Past Surgical History:  Procedure Laterality Date  . BACK SURGERY     LUMBAR SURGERIES x 4 - INCLUDING FUSIONS  . CARPAL TUNNEL REPAIR Left   . CHOLECYSTECTOMY  09/25/2011   Procedure: LAPAROSCOPIC CHOLECYSTECTOMY;  Surgeon: Fabio Bering, MD;  Location: [facility];  Service: General;  Laterality: N/A;  . CYSTOSCOPY WITH RETROGRADE PYELOGRAM, URETEROSCOPY AND STENT PLACEMENT Right 05/26/2013   Procedure: CYSTOSCOPY, RIGHT RETROGRADE PYELOGRAM, RIGHT URETEROSCOPY AND RIGHT STENT PLACEMENT, stone extraction, biopsy;  Surgeon: Crist Fat, MD;  Location: [facility];  Service: Urology;  Laterality: Right;  . HERNIA REPAIR     INGUINAL HERNIA REPARI MAYBE 30 YRS AGO  . MULTIPLE TOOTH EXTRACTIONS  Social History   Social History  . Marital status: Married    Spouse name: N/A  . Number of children: N/A  . Years of education: N/A   Occupational History  . Not on file.   Social History Main Topics  . Smoking status: Former Smoker    Packs/day: 1.00    Years: 45.00    Types: Cigarettes    Quit date: 03/18/2012  . Smokeless tobacco: Never Used  . Alcohol use No  . Drug use: No  . Sexual activity: Yes    Birth control/ protection: None   Other Topics Concern  . Not on file   Social History Narrative  . No narrative on file    No  Known Allergies  Family History  Problem Relation Age of Onset  . Congestive Heart Failure Mother 65  . Diabetes Father 38  . Heart attack Father   . Anesthesia problems Neg Hx   . Hypotension Neg Hx   . Malignant hyperthermia Neg Hx   . Pseudochol deficiency Neg Hx     Prior to Admission medications   Medication Sig Start Date End Date Taking? Authorizing Provider  calcium-vitamin D (OSCAL WITH D) 500-200 MG-UNIT tablet Take 1 tablet by mouth daily with breakfast.   Yes Historical Provider, MD  clobetasol cream (TEMOVATE) 0.05 % Apply 1 application topically 2 (two) times daily.   Yes Historical Provider, MD  escitalopram (LEXAPRO) 5 MG tablet Take 1 tablet by mouth daily. 03/11/16  Yes Historical Provider, MD  fluticasone (CUTIVATE) 0.05 % cream Apply topically 2 (two) times daily.   Yes Historical Provider, MD  glipiZIDE (GLUCOTROL XL) 10 MG 24 hr tablet Take 10 mg by mouth daily with breakfast.   Yes Historical Provider, MD  lisinopril-hydrochlorothiazide (PRINZIDE,ZESTORETIC) 10-12.5 MG tablet Take 1 tablet by mouth daily. 02/18/16  Yes Historical Provider, MD  metFORMIN (GLUCOPHAGE) 500 MG tablet Take 500 mg by mouth 2 (two) times daily with a meal.   Yes Historical Provider, MD  naproxen sodium (ALEVE) 220 MG tablet Take 220 mg by mouth 2 (two) times daily with a meal.   Yes Historical Provider, MD  Omega-3 Fatty Acids (FISH OIL) 1000 MG CAPS Take 1 capsule by mouth daily.   Yes Historical Provider, MD  pantoprazole (PROTONIX) 40 MG tablet Take 1 tablet by mouth daily. 03/11/16  Yes Historical Provider, MD  vitamin B-12 (CYANOCOBALAMIN) 1000 MCG tablet Take 1,000 mcg by mouth daily.   Yes Historical Provider, MD    Physical Exam: Vitals:   03/13/16 2145 03/13/16 2200 03/13/16 2230 03/13/16 2245  BP: 135/67 121/70 130/86 149/79  Pulse:   72 73  Resp: 20 21 22 19   Temp:      SpO2:   97% 98%  Weight:      Height:         General:  Appears calm and comfortable and is NAD; he  does seem to notice when new people arrive in the room based on sound but is unable to distinguish who they are by sight (including his daughter) Eyes: PERRL, EOMI, normal lids, iris, does not focus or accommodate well. No gaze deviation.  Difficulty seeing up close objects and decreased peripheral vision.  ENT:  grossly normal hearing, lips & tongue, mmm Neck:  no LAD, masses or thyromegaly Cardiovascular:  RRR, no m/r/g. No LE edema.  Respiratory:  CTA bilaterally, no w/r/r. Normal respiratory effort. Abdomen:  soft, ntnd, NABS Skin:  no rash or induration seen on limited  exam Musculoskeletal:  grossly normal tone BUE/BLE, with contracture of left hand/wrist, good ROM, no bony abnormality Psychiatric:  eccentric affect, makes jokes and so it is difficult to tell what his true deficits are, speech fluent and appropriate, AOx3 Neurologic:  CN 2-12 grossly intact, moves all extremities in coordinated fashion, sensation intact, mild decrease in left upper and lower extremity strength  Labs on Admission: I have personally reviewed following labs and imaging studies  CBC:  Recent Labs Lab 03/13/16 1931 03/13/16 1947  WBC 15.8*  --   NEUTROABS 9.8*  --   HGB 15.5 15.3  HCT 43.8 45.0  MCV 86.2  --   PLT 287  --    Basic Metabolic Panel:  Recent Labs Lab 03/13/16 1931 03/13/16 1947  NA 133* 139  K 3.9 4.0  CL 99* 99*  CO2 26  --   GLUCOSE 119* 119*  BUN 21* 21*  CREATININE 1.43* 1.50*  CALCIUM 9.4  --    GFR: Estimated Creatinine Clearance: 66.1 mL/min (by C-G formula based on SCr of 1.5 mg/dL (H)). Liver Function Tests:  Recent Labs Lab 03/13/16 1931  AST 23  ALT 33  ALKPHOS 81  BILITOT 0.5  PROT 8.4*  ALBUMIN 4.2   No results for input(s): LIPASE, AMYLASE in the last 168 hours. No results for input(s): AMMONIA in the last 168 hours. Coagulation Profile:  Recent Labs Lab 03/13/16 1931  INR 0.92   Cardiac Enzymes:  Recent Labs Lab 03/13/16 1931    TROPONINI <0.03   BNP (last 3 results) No results for input(s): PROBNP in the last 8760 hours. HbA1C: No results for input(s): HGBA1C in the last 72 hours. CBG:  Recent Labs Lab 03/13/16 1939  GLUCAP 134*   Lipid Profile: No results for input(s): CHOL, HDL, LDLCALC, TRIG, CHOLHDL, LDLDIRECT in the last 72 hours. Thyroid Function Tests: No results for input(s): TSH, T4TOTAL, FREET4, T3FREE, THYROIDAB in the last 72 hours. Anemia Panel: No results for input(s): VITAMINB12, FOLATE, FERRITIN, TIBC, IRON, RETICCTPCT in the last 72 hours. Urine analysis:    Component Value Date/Time   COLORURINE YELLOW 05/20/2013 2220   APPEARANCEUR CLEAR 05/20/2013 2220   LABSPEC >1.030 (H) 05/20/2013 2220   PHURINE 5.5 05/20/2013 2220   GLUCOSEU NEGATIVE 05/20/2013 2220   HGBUR SMALL (A) 05/20/2013 2220   BILIRUBINUR NEGATIVE 05/20/2013 2220   KETONESUR NEGATIVE 05/20/2013 2220   PROTEINUR NEGATIVE 05/20/2013 2220   UROBILINOGEN 0.2 05/20/2013 2220   NITRITE POSITIVE (A) 05/20/2013 2220   LEUKOCYTESUR NEGATIVE 05/20/2013 2220    Creatinine Clearance: Estimated Creatinine Clearance: 66.1 mL/min (by C-G formula based on SCr of 1.5 mg/dL (H)).  Sepsis Labs: @LABRCNTIP (procalcitonin:4,lacticidven:4) )No results found for this or any previous visit (from the past 240 hour(s)).   Radiological Exams on Admission: Ct Head Wo Contrast  Result Date: 03/13/2016 CLINICAL DATA:  Headache EXAM: CT HEAD WITHOUT CONTRAST TECHNIQUE: Contiguous axial images were obtained from the base of the skull through the vertex without intravenous contrast. COMPARISON:  03/20/2012, 03/17/2012 FINDINGS: Brain: There is no acute hemorrhage identified. There is no focal mass, mass effect or midline shift. Evidence of large old right-sided infarct involving the occipital, temporal, frontal and parietal lobes. Mild associated distortion of right lateral ventricle. There is hypodensity within the left paracentral occipital  lobe, new compared with 2013 study. No significant surrounding mass effect. Periventricular white matter hypodensities consistent with small vessel disease. Old appearing lacunar infarcts within the bilateral the thalamus. Old small infarct in the right  cerebellar hemisphere. Vascular: No hyperdense vessels. Mild calcifications within the carotid arteries at the skullbase. Skull: No fracture. Minimal opacifications of the inferior mastoids. Sinuses/Orbits: Mild mucosal thickening in paranasal sinuses. No acute orbital abnormality. Other: None IMPRESSION: 1. No acute hemorrhage or focal mass lesion visualized. 2. Evidence of old large right-sided hemispheric infarct. 3. Focal hypodense area within the left parasagittal occipital lobe, new since 2013, likely old given absence of significant surrounding mass effect, however MRI could be obtained to confirm that this does not represent subacute ischemic change. 4. Old lacunar infarcts within the thalamus. Old right cerebellar infarct. Electronically Signed   By: Jasmine PangKim  Fujinaga M.D.   On: 03/13/2016 20:23    EKG: Independently reviewed.  NSR with rate 91; RBBB with no evidence of acute ischemia  Assessment/Plan Principal Problem:   CVA (cerebral vascular accident) (HCC) Active Problems:   Hemiplegia affecting nondominant side (HCC)   Diabetes (HCC)   Essential hypertension   Hyperlipidemia   CVA -Acute onset of visual symptoms are quite concerning for CVA, particularly since he did have a prior CVA affecting a similar area -CT with old vs. Subacute infarct in occipital region, which would correspond to visual center -[facility] and he recommends transfer for neuro evaluation and complete stroke work-up -I also discussed the patient with ophthalmology to ensure that there were no emergent eye conditions that could lead to these bilateral symptoms; the ophthalmologist felt confident that this was stroke-related and would  require CVA evaluation rather than eye exam -[facility]/TIA evaluation -Telemetry monitoring -MRI/MRA -Carotid dopplers -Echo -Risk stratification with FLP, A1c -Will check UDS -ASA daily - unclear why he was not previously waking antiplatelet therapy -PT/OT/ST/Nutrition consults -May need specialized rehab to ensure optimization of vision in the future  HTN -Allow permissive HTN -Hold ACE/HCTZ and plan to restart in 48-72 hours -BP well controlled while in the ER  HLD -Check FLP -It is unclear why this patient with prior strokes with debility and no drug allergies is not taking statin; will start Lipitor 40 mg qhs for now -Can resume fish oil as an outpatient  DM -Check A1c -Reasonable control on admission -Hold oral meds (Glucophage, Glucotrol) and cover with SSI  DVT prophylaxis: Lovenox  Code Status: DNR - confirmed with patient/family Family Communication: Wife, daughter present at bedside Disposition Plan: To be determined Consults called: Neurology, ophthalmology (telephone only) Admission status: Admit - It is my clinical opinion that admission to INPATIENT is reasonable and necessary because this patient will require at least 2 midnights in the hospital to treat this condition based on the medical complexity of the problems presented.  Given the aforementioned information, the predictability of an adverse outcome is felt to be significant.    Jonah BlueJennifer Jerik Falletta MD [facility]  If 7PM-7AM, please contact night-coverage [email or web address] Password [email or web address]  03/13/2016, 10:54 PM

## 2016-03-13 NOTE — ED Triage Notes (Signed)
Patient reports of headache that stared last night then improved. States he took nap and woke up today at lunch with blurred vision. Had stroke 4 years ago and has left arm draw with weakness. Patient denies any change. Complains of headache at this time.

## 2016-03-13 NOTE — ED Provider Notes (Signed)
[facility] Provider Note   CSN: [id number] Arrival date & time: 03/13/16  1910     History   Chief Complaint Chief Complaint  Patient presents with  . Blurred Vision    HPI Ronney LionRoy Stobaugh is a 65 y.o. male.  The history is provided by the patient.   [age] year old male who presents with blurry vision. History of CVA w/ residual left sided hemiplegia, HTN, HLD, CAD w/ stent. Reports onset of bilateral blurry vision upon waking up from a nap around 1 PM yesterday. Symptoms constant, unchanged to today. Associated with mild numbness over left side of face. No speech changes, focal numbness/weakness that is new of the arms or legs. Associated with left retroorbital headache, but no nausea or vomiting. No chest pain or difficulty breathing. No aggravating or alleviating factors. No recent illness including fever, cough, URI sx, GI sx, or urinary sx.   Past Medical History:  Diagnosis Date  . Acute pancreatitis 09/14/2011  . Atrophy of right kidney 09/15/2011  . CAD (coronary artery disease)    HX OF TWO HEART STENTS PLACED APPROX 10 OR 11 YRS AGO- STATES RELEASED BY CARDIOLOGIST- NO LONGER SEES  . Chronic back pain   . Diabetes (HCC)   . High cholesterol   . Hypertension   . Kidney calculi 09/15/2011  . Myocardial infarction   . Seizures (HCC)    AFTER THE STROKE - BUT NONE SINCE  . Sleep apnea    STATES TRIED CPAP - DID NOT TOLERATE - RETURNED CPAP - DOES NOT HAVE ANYMORE  . Stented coronary artery approx 2004  . Stroke (HCC) 03/18/2012   LEFT SIDED WEAKNESS - STILL HAS SOME LUE>LLE WEAKNESS - ABLE TO AMBULATE WITH OR WITHOUT CANE BUT DOES NOT HAVE USE OF LEFT ARM  . TIA (transient ischemic attack)     Patient Active Problem List   Diagnosis Date Noted  . CVA (cerebral vascular accident) (HCC) 03/13/2016  . Muscle weakness (generalized) 05/31/2012  . Difficulty walking 05/31/2012  . Lack of coordination 05/31/2012  . Cytotoxic cerebral edema (HCC) 03/19/2012  . Aortic  thromboembolism (HCC) 03/19/2012  . Stroke, acute, embolic, right 03/18/2012  . Seizure (HCC) 03/18/2012  . Stented coronary artery   . Hemiplegia affecting nondominant side (HCC) 03/17/2012  . Atrophy of right kidney 09/15/2011  . Kidney calculi 09/15/2011  . Acute pancreatitis 09/14/2011  . Cholelithiasis 09/14/2011  . Hyperglycemia 09/14/2011    Past Surgical History:  Procedure Laterality Date  . BACK SURGERY     LUMBAR SURGERIES x 4 - INCLUDING FUSIONS  . CARPAL TUNNEL REPAIR Left   . CHOLECYSTECTOMY  09/25/2011   Procedure: LAPAROSCOPIC CHOLECYSTECTOMY;  Surgeon: Fabio BeringBrent C Ziegler, MD;  Location: [facility];  Service: General;  Laterality: N/A;  . CYSTOSCOPY WITH RETROGRADE PYELOGRAM, URETEROSCOPY AND STENT PLACEMENT Right 05/26/2013   Procedure: CYSTOSCOPY, RIGHT RETROGRADE PYELOGRAM, RIGHT URETEROSCOPY AND RIGHT STENT PLACEMENT, stone extraction, biopsy;  Surgeon: Crist FatBenjamin W Herrick, MD;  Location: [facility];  Service: Urology;  Laterality: Right;  . HERNIA REPAIR     INGUINAL HERNIA REPARI MAYBE 30 YRS AGO  . MULTIPLE TOOTH EXTRACTIONS         Home Medications    Prior to Admission medications   Medication Sig Start Date End Date Taking? Authorizing Provider  calcium-vitamin D (OSCAL WITH D) 500-200 MG-UNIT tablet Take 1 tablet by mouth daily with breakfast.   Yes Historical Provider, MD  clobetasol cream (TEMOVATE) 0.05 % Apply 1 application topically 2 (  two) times daily.   Yes Historical Provider, MD  escitalopram (LEXAPRO) 5 MG tablet Take 1 tablet by mouth daily. 03/11/16  Yes Historical Provider, MD  fluticasone (CUTIVATE) 0.05 % cream Apply topically 2 (two) times daily.   Yes Historical Provider, MD  glipiZIDE (GLUCOTROL XL) 10 MG 24 hr tablet Take 10 mg by mouth daily with breakfast.   Yes Historical Provider, MD  lisinopril-hydrochlorothiazide (PRINZIDE,ZESTORETIC) 10-12.5 MG tablet Take 1 tablet by mouth daily. 02/18/16  Yes Historical Provider, MD  metFORMIN  (GLUCOPHAGE) 500 MG tablet Take 500 mg by mouth 2 (two) times daily with a meal.   Yes Historical Provider, MD  naproxen sodium (ALEVE) 220 MG tablet Take 220 mg by mouth 2 (two) times daily with a meal.   Yes Historical Provider, MD  Omega-3 Fatty Acids (FISH OIL) 1000 MG CAPS Take 1 capsule by mouth daily.   Yes Historical Provider, MD  pantoprazole (PROTONIX) 40 MG tablet Take 1 tablet by mouth daily. 03/11/16  Yes Historical Provider, MD  vitamin B-12 (CYANOCOBALAMIN) 1000 MCG tablet Take 1,000 mcg by mouth daily.   Yes Historical Provider, MD    Family History Family History  Problem Relation Age of Onset  . Congestive Heart Failure Mother 68  . Diabetes Father 82  . Heart attack Father   . Anesthesia problems Neg Hx   . Hypotension Neg Hx   . Malignant hyperthermia Neg Hx   . Pseudochol deficiency Neg Hx     Social History Social History  Substance Use Topics  . Smoking status: Former Smoker    Packs/day: 1.00    Years: 45.00    Types: Cigarettes    Quit date: 03/18/2012  . Smokeless tobacco: Never Used  . Alcohol use No     Allergies   Review of patient's allergies indicates no known allergies.   Review of Systems Review of Systems 10/14 systems reviewed and are negative other than those stated in the HPI   Physical Exam Updated Vital Signs BP 143/68   Pulse 75   Temp 97.5 F (36.4 C)   Resp 22   Ht 6' (1.829 m)   Wt 275 lb (124.7 kg)   SpO2 98%   BMI 37.30 kg/m   Physical Exam Physical Exam  Nursing note and vitals reviewed. Constitutional: non-toxic, and in no acute distress Head: Normocephalic and atraumatic.  Mouth/Throat: Oropharynx is clear and moist.  Neck: Normal range of motion. Neck supple.  Cardiovascular: Normal rate and regular rhythm.   Pulmonary/Chest: Effort normal and breath sounds normal.  Abdominal: Soft. There is no tenderness. There is no rebound and no guarding.  Musculoskeletal: no deformities Skin: Skin is warm and dry.    Psychiatric: Cooperative Neurological:  Alert, oriented to person, place, time, and situation. Memory grossly in tact. Fluent speech. No dysarthria or aphasia.  Cranial nerves: Unable to count fingers up close in bilateral eyes.  Pupils are symmetric, and reactive to light. EOMI without nystagmus. No gaze deviation. Facial muscles symmetric with activation. Sensation to light touch over face slightly diminished over left face. Hearing grossly in tact. Palate elevates symmetrically. Head turn and shoulder shrug are intact. Tongue midline.  Reflexes defered.  Muscle bulk and tone normal of b/l LE and RUE. LUE in slight contracture. Full strength RUE and RLE against gravity. Weakness in LUE/LLE baseline. Sensation to light touch is grossly in tact throughout in bilateral upper and lower extremities. Unable to perform coordination testing due to vision problem.  ED Treatments / Results  Labs (all labs ordered are listed, but only abnormal results are displayed) Labs Reviewed  CBC - Abnormal; Notable for the following:       Result Value   WBC 15.8 (*)    All other components within normal limits  DIFFERENTIAL - Abnormal; Notable for the following:    Neutro Abs 9.8 (*)    Lymphs Abs 4.1 (*)    Monocytes Absolute 1.6 (*)    All other components within normal limits  COMPREHENSIVE METABOLIC PANEL - Abnormal; Notable for the following:    Sodium 133 (*)    Chloride 99 (*)    Glucose, Bld 119 (*)    BUN 21 (*)    Creatinine, Ser 1.43 (*)    Total Protein 8.4 (*)    GFR calc non Af Amer 50 (*)    GFR calc Af Amer 57 (*)    All other components within normal limits  CBG MONITORING, ED - Abnormal; Notable for the following:    Glucose-Capillary 134 (*)    All other components within normal limits  I-STAT CHEM 8, ED - Abnormal; Notable for the following:    Chloride 99 (*)    BUN 21 (*)    Creatinine, Ser 1.50 (*)    Glucose, Bld 119 (*)    All other components within normal  limits  PROTIME-INR  APTT  TROPONIN I  URINALYSIS, ROUTINE W REFLEX MICROSCOPIC (NOT [facility])    EKG  EKG Interpretation  Date/Time:  Friday March 13 2016 19:16:28 EDT Ventricular Rate:  91 PR Interval:    QRS Duration: 152 QT Interval:  395 QTC Calculation: 486 R Axis:   77 Text Interpretation:  Sinus rhythm Right bundle branch block No significant change since last tracing Confirmed by Kemper Heupel MD, Nari Vannatter [phone]([id number]) on 03/13/2016 8:36:12 PM       Radiology Ct Head Wo Contrast  Result Date: 03/13/2016 CLINICAL DATA:  Headache EXAM: CT HEAD WITHOUT CONTRAST TECHNIQUE: Contiguous axial images were obtained from the base of the skull through the vertex without intravenous contrast. COMPARISON:  03/20/2012, 03/17/2012 FINDINGS: Brain: There is no acute hemorrhage identified. There is no focal mass, mass effect or midline shift. Evidence of large old right-sided infarct involving the occipital, temporal, frontal and parietal lobes. Mild associated distortion of right lateral ventricle. There is hypodensity within the left paracentral occipital lobe, new compared with 2013 study. No significant surrounding mass effect. Periventricular white matter hypodensities consistent with small vessel disease. Old appearing lacunar infarcts within the bilateral the thalamus. Old small infarct in the right cerebellar hemisphere. Vascular: No hyperdense vessels. Mild calcifications within the carotid arteries at the skullbase. Skull: No fracture. Minimal opacifications of the inferior mastoids. Sinuses/Orbits: Mild mucosal thickening in paranasal sinuses. No acute orbital abnormality. Other: None IMPRESSION: 1. No acute hemorrhage or focal mass lesion visualized. 2. Evidence of old large right-sided hemispheric infarct. 3. Focal hypodense area within the left parasagittal occipital lobe, new since 2013, likely old given absence of significant surrounding mass effect, however MRI could be obtained to confirm that this  does not represent subacute ischemic change. 4. Old lacunar infarcts within the thalamus. Old right cerebellar infarct. Electronically Signed   By: Jasmine PangKim  Fujinaga M.D.   On: 03/13/2016 20:23    Procedures Procedures (including critical care time)  Medications Ordered in ED Medications  sodium chloride 0.9 % bolus 1,000 mL (0 mLs Intravenous Stopped 03/13/16 2117)     Initial Impression / Assessment  and Plan / ED Course  I have reviewed the triage vital signs and the nursing notes.  Pertinent labs & imaging results that were available during my care of the patient were reviewed by me and considered in my medical decision making (see chart for details).  Clinical Course    65 year old male who presents with blurry vision since 1 PM yesterday. Is not within the TPA window. On exam, unable to count fingers up close on visual acuity testing. This is new for him. Subtle numbness over left face, but remainder of neuro exam in tact. CT head shows a hypodense lesion in the left occipital region, new since 2013, but question subacute versus old by radiology. Discussed with Dr. Roseanne Reno from neurology. He is recommending transfer to [facility] for hospitalist admission and MRI. Spoke with Dr. Ophelia Charter who will arrange admission at [facility].  Final Clinical Impressions(s) / ED Diagnoses   Final diagnoses:  Blurry vision, bilateral    New Prescriptions New Prescriptions   No medications on file     Lavera Guise, MD 03/13/16 2205

## 2016-03-13 NOTE — ED Notes (Signed)
I informed dr zammit of [place] symptoms at [id number].

## 2016-03-13 NOTE — ED Notes (Signed)
Pt's wife states pt developed headache last night and did not fell will this morning and laid down for nap. Wife states she pt woke up not sure the time around 1200, pt had blurred vision. Pt still c/o headache to the back of the head.

## 2016-03-14 ENCOUNTER — Inpatient Hospital Stay (HOSPITAL_COMMUNITY): Payer: Medicare Other

## 2016-03-14 DIAGNOSIS — I63332 Cerebral infarction due to thrombosis of left posterior cerebral artery: Secondary | ICD-10-CM

## 2016-03-14 DIAGNOSIS — E784 Other hyperlipidemia: Secondary | ICD-10-CM

## 2016-03-14 DIAGNOSIS — E0822 Diabetes mellitus due to underlying condition with diabetic chronic kidney disease: Secondary | ICD-10-CM

## 2016-03-14 DIAGNOSIS — N183 Chronic kidney disease, stage 3 (moderate): Secondary | ICD-10-CM

## 2016-03-14 DIAGNOSIS — I10 Essential (primary) hypertension: Secondary | ICD-10-CM

## 2016-03-14 LAB — RAPID URINE DRUG SCREEN, HOSP PERFORMED
Amphetamines: NOT DETECTED
Barbiturates: NOT DETECTED
Benzodiazepines: NOT DETECTED
COCAINE: NOT DETECTED
OPIATES: NOT DETECTED
Tetrahydrocannabinol: NOT DETECTED

## 2016-03-14 LAB — GLUCOSE, CAPILLARY
GLUCOSE-CAPILLARY: 133 mg/dL — AB (ref 65–99)
GLUCOSE-CAPILLARY: 187 mg/dL — AB (ref 65–99)
GLUCOSE-CAPILLARY: 117 mg/dL — AB (ref 65–99)
Glucose-Capillary: 143 mg/dL — ABNORMAL HIGH (ref 65–99)
Glucose-Capillary: 119 mg/dL — ABNORMAL HIGH (ref 65–99)

## 2016-03-14 LAB — LIPID PANEL
CHOL/HDL RATIO: 7.6 ratio
Cholesterol: 189 mg/dL (ref 0–200)
HDL: 25 mg/dL — AB (ref >40)
LDL CALC: 113 mg/dL — AB (ref 0–99)
TRIGLYCERIDES: 255 mg/dL — AB (ref <150)
VLDL: 51 mg/dL — AB (ref 0–40)

## 2016-03-14 MED ORDER — ACETAMINOPHEN 325 MG PO TABS
650.0000 mg | ORAL_TABLET | ORAL | Status: DC | PRN
  Administered 2016-03-14 – 2016-03-16 (×6): 650 mg via ORAL
  Filled 2016-03-14 (×6): qty 2

## 2016-03-14 MED ORDER — SODIUM CHLORIDE 0.9 % IV SOLN
INTRAVENOUS | Status: DC
  Administered 2016-03-14: 01:00:00 via INTRAVENOUS

## 2016-03-14 MED ORDER — SENNOSIDES-DOCUSATE SODIUM 8.6-50 MG PO TABS: 1.0000 | ORAL_TABLET | Freq: Every evening | ORAL | Status: DC | PRN

## 2016-03-14 MED ORDER — ASPIRIN 300 MG RE SUPP: 300.0000 mg | Freq: Every day | RECTAL | Status: DC

## 2016-03-14 MED ORDER — ESCITALOPRAM OXALATE 10 MG PO TABS: 10.0000 mg | ORAL_TABLET | Freq: Every day | ORAL | Status: DC

## 2016-03-14 MED ORDER — STROKE: EARLY STAGES OF RECOVERY BOOK
Freq: Once | Status: AC
  Administered 2016-03-14: 02:00:00
  Filled 2016-03-14: qty 1

## 2016-03-14 MED ORDER — LORAZEPAM 2 MG/ML IJ SOLN
2.0000 mg | Freq: Once | INTRAMUSCULAR | Status: AC
  Administered 2016-03-14: 2 mg via INTRAVENOUS
  Filled 2016-03-14 (×2): qty 1

## 2016-03-14 MED ORDER — PANTOPRAZOLE SODIUM 40 MG PO TBEC
40.0000 mg | DELAYED_RELEASE_TABLET | Freq: Every day | ORAL | Status: DC
  Administered 2016-03-14 – 2016-03-17 (×4): 40 mg via ORAL
  Filled 2016-03-14 (×4): qty 1

## 2016-03-14 MED ORDER — INSULIN ASPART 100 UNIT/ML ~~LOC~~ SOLN
0.0000 [IU] | Freq: Three times a day (TID) | SUBCUTANEOUS | Status: DC
  Administered 2016-03-14: 3 [IU] via SUBCUTANEOUS
  Administered 2016-03-15: 2 [IU] via SUBCUTANEOUS
  Administered 2016-03-15: 3 [IU] via SUBCUTANEOUS
  Administered 2016-03-16 (×2): 2 [IU] via SUBCUTANEOUS
  Administered 2016-03-16 – 2016-03-17 (×2): 3 [IU] via SUBCUTANEOUS
  Administered 2016-03-17: 2 [IU] via SUBCUTANEOUS

## 2016-03-14 MED ORDER — ACETAMINOPHEN 650 MG RE SUPP: 650.0000 mg | RECTAL | Status: DC | PRN

## 2016-03-14 MED ORDER — CITALOPRAM HYDROBROMIDE 20 MG PO TABS
20.0000 mg | ORAL_TABLET | Freq: Every day | ORAL | Status: DC
  Administered 2016-03-14: 20 mg via ORAL
  Filled 2016-03-14: qty 1

## 2016-03-14 MED ORDER — ENOXAPARIN SODIUM 40 MG/0.4ML ~~LOC~~ SOLN
40.0000 mg | SUBCUTANEOUS | Status: DC
  Administered 2016-03-14 – 2016-03-17 (×4): 40 mg via SUBCUTANEOUS
  Filled 2016-03-14 (×4): qty 0.4

## 2016-03-14 MED ORDER — ASPIRIN 325 MG PO TABS
325.0000 mg | ORAL_TABLET | Freq: Every day | ORAL | Status: DC
  Administered 2016-03-14 – 2016-03-17 (×4): 325 mg via ORAL
  Filled 2016-03-14 (×4): qty 1

## 2016-03-14 MED ORDER — ESCITALOPRAM OXALATE 10 MG PO TABS
5.0000 mg | ORAL_TABLET | Freq: Every day | ORAL | Status: DC
  Administered 2016-03-14 – 2016-03-17 (×4): 5 mg via ORAL
  Filled 2016-03-14 (×3): qty 1

## 2016-03-14 NOTE — Progress Notes (Signed)
VASCULAR LAB PRELIMINARY  PRELIMINARY  PRELIMINARY  PRELIMINARY  Carotid duplex completed.    Preliminary report:  1-39% ICA plaquing.  Vertebral artery flow is antegrade.   Felma Pfefferle, RVT 03/14/2016, 1:38 PM

## 2016-03-14 NOTE — Progress Notes (Signed)
PT Cancellation Note  Patient Details Name: Shaun Pugh MRN: [id number] DOB: 1950/05/28   Cancelled Treatment:    Reason Eval/Treat Not Completed: Patient not medically ready.  Patient remains on bedrest per MD orders.  *MD:  Please write activity orders when appropriate for patient.  PT will initiate evaluation at that time.  Thank you.   Vena AustriaDavis, Yunique Dearcos H 03/14/2016, 2:05 PM Durenda HurtSusan H. Renaldo Fiddleravis, PT, [facility] Acute Rehab Services Pager [phone]

## 2016-03-14 NOTE — Progress Notes (Signed)
Nutrition Education Note  RD consulted for CVA.   Lipid Panel     Component Value Date/Time   CHOL 189 03/14/2016 0106   TRIG 255 (H) 03/14/2016 0106   HDL 25 (L) 03/14/2016 0106   CHOLHDL 7.6 03/14/2016 0106   VLDL 51 (H) 03/14/2016 0106   LDLCALC 113 (H) 03/14/2016 0106    Pt states that he doesn't think he has received any heart healthy nutrition education in the past. Wife at bedside states that she thinks pt has, but it goes in one ear and out the other. RD provided "Heart Healthy Nutrition Therapy" and "20 ways to Enjoy more Fruits and Vegetables" handouts from the [employer] of [employer]etics. Encouraged fresh fruits and vegetables as well as whole grain sources of carbohydrates to maximize fiber intake. Reviewed heart healthy fats (olive oil, nuts etc) and encouraged intake of these over other fats. Recommended decreasing intake of processed meats and increasing intake of fish, nuts, and lean protein. Pt very lethargic at time of visit and kept closing eyes. RD name and contact information provided. Encouraged pt to contact RD with questions.    Body mass index is 35.24 kg/m. Pt meets criteria for Obesity based on current BMI.  Current diet order is Heart healthy, patient is consuming approximately 50-100% of meals at this time. He reports having a good appetite and eating well. Labs and medications reviewed. No further nutrition interventions warranted at this time. If additional nutrition issues arise, please re-consult RD.  Dorothea Ogleeanne Shanon Becvar RD, CSP, LDN Inpatient Clinical Dietitian Pager: [phone] After Hours Pager: [phone]

## 2016-03-14 NOTE — Progress Notes (Signed)
Bladder scan completed, higets amout scanned was 799 ml.  MD paged. Will continue to monitor and await return call and new orders.  Forbes Cellarelcine Isabella Roemmich, RN

## 2016-03-14 NOTE — Progress Notes (Signed)
PROGRESS NOTE        PATIENT DETAILS Name: Shaun Pugh Age: 65 y.o. Sex: male Date of Birth: 11-30-1950 Admit Date: 03/13/2016 Admitting Physician Jonah BlueJennifer Yates, MD ZOX:[id number]:Zack Margo AyeHall, MD  Brief Narrative: Patient is a 65 y.o. male with prior history of CVA with resultant left eye blindness and hemiparesis (LUE>LLE) presented with sudden onset of visual loss from her right eye. MRI of the brain confirms left occipital PCA infarct.  Subjective: No change in vision  Assessment/Plan: Acute CVA: Presenting as visual loss, MRI of the brain confirms acute left occipital PCA infarct, it also shows a large chronic right cerebral hemispheric infarct. Patient already had very poor vision in his left eye, and now unfortunately has developed right eye visual loss as well. Has chronic left-sided hemiparesis (LUE>LLE) that is essentially unchanged. Carotid Doppler does not show any significant stenosis. Await echo and A1c. LDL elevated at 113, started on 40 mg of Lipitor. Await further recommendations from stroke service.  Hypertension: Allow permissive hypertension in the setting of acute CVA, resume antihypertensives over the next few days.  Type 2 diabetes: Continue to hold oral hypoglycemics, A1c pending. CBGs stable with SSI.  Chronic kidney disease stage III: Creatinine close to usual baseline, follow  Leukocytosis: No indication of infection at this time, follow  Chronic urinary retention: Per spouse-the patient has had issues with urinary retention for a while-and has had urology evaluation-and likely has a neurogenic bladder. Have encouraged ambulation and voiding in a bedside commode. Follow serial bladder ultrasounds. Hoping to avoid Foley catheter placement as much as possible.  DVT Prophylaxis: Prophylactic Lovenox   Code Status: Full code   Family Communication: Spouse at bedside  Disposition Plan: Remain inpatient-may require SNF on  discharge  Antimicrobial agents: None  Procedures: Carotid/Dopplers 11/4>>-39% ICA plaquing.  Vertebral artery flow is antegrade  CONSULTS:  neurology  Time spent: 25- minutes-Greater than 50% of this time was spent in counseling, explanation of diagnosis, planning of further management, and coordination of care.  MEDICATIONS: Anti-infectives    None      Scheduled Meds: . aspirin  300 mg Rectal Daily   Or  . aspirin  325 mg Oral Daily  . atorvastatin  40 mg Oral q1800  . citalopram  20 mg Oral Daily  . enoxaparin (LOVENOX) injection  40 mg Subcutaneous Q24H  . insulin aspart  0-15 Units Subcutaneous TID WC  . pantoprazole  40 mg Oral Daily   Continuous Infusions: . sodium chloride 50 mL/hr at 03/14/16 0125   PRN Meds:.acetaminophen **OR** acetaminophen, senna-docusate   PHYSICAL EXAM: Vital signs: Vitals:   03/14/16 0808 03/14/16 0931 03/14/16 1130 03/14/16 1448  BP: (!) 133/44 (!) 128/56 (!) 166/79 100/70  Pulse: 70 70 73 87  Resp: 17 19 18 18   Temp: 98 F (36.7 C) 97.6 F (36.4 C) 97.6 F (36.4 C) 98 F (36.7 C)  TempSrc: Oral Oral Oral Oral  SpO2: 98% 99% 97% 99%  Weight:      Height:       Filed Weights   03/13/16 1915 03/14/16 0105  Weight: 124.7 kg (275 lb) 121.2 kg (267 lb 1.6 oz)   Body mass index is 35.24 kg/m.   General appearance :Awake, alert, not in any distress. Speech Clear. Chronically sick appearing. Eyes:patient was able to see light only HEENT: Atraumatic and Normocephalic Neck: supple,  no JVD. No cervical lymphadenopathy. No thyromegaly Resp:Good air entry bilaterally, no added sounds  CVS: S1 S2 regular, no murmurs.  GI: Bowel sounds present, Non tender and not distended with no gaurding, rigidity or rebound.No organomegaly Extremities: B/L Lower Ext shows no edema, both legs are warm to touch Neurology:  Left upper extremity, around 3+/5, left lower extremity 4/5-right upper/right lower around 5/5 Psychiatric: Alert and  oriented x 3. Normal mood. Musculoskeletal:No digital cyanosis Skin:No Rash, warm and dry Wounds:N/A  I have personally reviewed following labs and imaging studies  LABORATORY DATA: CBC:  Recent Labs Lab 03/13/16 1931 03/13/16 1947  WBC 15.8*  --   NEUTROABS 9.8*  --   HGB 15.5 15.3  HCT 43.8 45.0  MCV 86.2  --   PLT 287  --     Basic Metabolic Panel:  Recent Labs Lab 03/13/16 1931 03/13/16 1947  NA 133* 139  K 3.9 4.0  CL 99* 99*  CO2 26  --   GLUCOSE 119* 119*  BUN 21* 21*  CREATININE 1.43* 1.50*  CALCIUM 9.4  --     GFR: Estimated Creatinine Clearance: 66.1 mL/min (by C-G formula based on SCr of 1.5 mg/dL (H)).  Liver Function Tests:  Recent Labs Lab 03/13/16 1931  AST 23  ALT 33  ALKPHOS 81  BILITOT 0.5  PROT 8.4*  ALBUMIN 4.2   No results for input(s): LIPASE, AMYLASE in the last 168 hours. No results for input(s): AMMONIA in the last 168 hours.  Coagulation Profile:  Recent Labs Lab 03/13/16 1931  INR 0.92    Cardiac Enzymes:  Recent Labs Lab 03/13/16 1931  TROPONINI <0.03    BNP (last 3 results) No results for input(s): PROBNP in the last 8760 hours.  HbA1C: No results for input(s): HGBA1C in the last 72 hours.  CBG:  Recent Labs Lab 03/13/16 1939 03/14/16 0058 03/14/16 0805 03/14/16 1154  GLUCAP 134* 143* 119* 133*    Lipid Profile:  Recent Labs  03/14/16 0106  CHOL 189  HDL 25*  LDLCALC 113*  TRIG 255*  CHOLHDL 7.6    Thyroid Function Tests: No results for input(s): TSH, T4TOTAL, FREET4, T3FREE, THYROIDAB in the last 72 hours.  Anemia Panel: No results for input(s): VITAMINB12, FOLATE, FERRITIN, TIBC, IRON, RETICCTPCT in the last 72 hours.  Urine analysis:    Component Value Date/Time   COLORURINE YELLOW 03/13/2016 2022   APPEARANCEUR CLEAR 03/13/2016 2022   LABSPEC 1.010 03/13/2016 2022   PHURINE 6.0 03/13/2016 2022   GLUCOSEU NEGATIVE 03/13/2016 2022   HGBUR NEGATIVE 03/13/2016 2022    BILIRUBINUR NEGATIVE 03/13/2016 2022   KETONESUR NEGATIVE 03/13/2016 2022   PROTEINUR NEGATIVE 03/13/2016 2022   UROBILINOGEN 0.2 05/20/2013 2220   NITRITE NEGATIVE 03/13/2016 2022   LEUKOCYTESUR NEGATIVE 03/13/2016 2022    Sepsis Labs: Lactic Acid, Venous No results found for: LATICACIDVEN  MICROBIOLOGY: No results found for this or any previous visit (from the past 240 hour(s)).  RADIOLOGY STUDIES/RESULTS: Ct Head Wo Contrast  Result Date: 03/13/2016 CLINICAL DATA:  Headache EXAM: CT HEAD WITHOUT CONTRAST TECHNIQUE: Contiguous axial images were obtained from the base of the skull through the vertex without intravenous contrast. COMPARISON:  03/20/2012, 03/17/2012 FINDINGS: Brain: There is no acute hemorrhage identified. There is no focal mass, mass effect or midline shift. Evidence of large old right-sided infarct involving the occipital, temporal, frontal and parietal lobes. Mild associated distortion of right lateral ventricle. There is hypodensity within the left paracentral occipital lobe, new compared  with 2013 study. No significant surrounding mass effect. Periventricular white matter hypodensities consistent with small vessel disease. Old appearing lacunar infarcts within the bilateral the thalamus. Old small infarct in the right cerebellar hemisphere. Vascular: No hyperdense vessels. Mild calcifications within the carotid arteries at the skullbase. Skull: No fracture. Minimal opacifications of the inferior mastoids. Sinuses/Orbits: Mild mucosal thickening in paranasal sinuses. No acute orbital abnormality. Other: None IMPRESSION: 1. No acute hemorrhage or focal mass lesion visualized. 2. Evidence of old large right-sided hemispheric infarct. 3. Focal hypodense area within the left parasagittal occipital lobe, new since 2013, likely old given absence of significant surrounding mass effect, however MRI could be obtained to confirm that this does not represent subacute ischemic change. 4.  Old lacunar infarcts within the thalamus. Old right cerebellar infarct. Electronically Signed   By: Jasmine Pang M.D.   On: 03/13/2016 20:23   Mr Brain Wo Contrast  Result Date: 03/14/2016 CLINICAL DATA:  Acute onset visual loss.  Prior stroke. EXAM: MRI HEAD WITHOUT CONTRAST MRA HEAD WITHOUT CONTRAST TECHNIQUE: Multiplanar, multiecho pulse sequences of the brain and surrounding structures were obtained without intravenous contrast. Angiographic images of the head were obtained using MRA technique without contrast. COMPARISON:  Head CT 03/13/2016 and CTA 03/17/2012 FINDINGS: MRI HEAD FINDINGS The examination had to be discontinued prior to completion due to patient claustrophobia. Axial and coronal diffusion, sagittal T1, and axial T2* gradient echo sequences were obtained. There is a 5 cm acute infarct in the left PCA territory involving the medial occipital lobe. A focus of microhemorrhage is noted near the posterior superior aspect of the infarct. A large chronic right cerebral hemispheric infarct is again seen with associated chronic blood products and ex vacuo enlargement of the right lateral ventricle. There are also small chronic infarcts in the thalami and cerebellum bilaterally. There is moderate cerebral atrophy. No significant mass effect or evidence of extra-axial fluid collection on this limited study. MRA HEAD FINDINGS The study is moderately to severely motion degraded. The visualized distal left vertebral artery is patent. No flow related enhancement is seen in the distal right vertebral artery, which was patent but hypoplastic on the prior CTA. The basilar artery is patent without evidence of significant stenosis. The P1 segments and proximal left P2 are grossly patent. Signal dropout at the level of the proximal right P2 segment may reflect a high-grade stenosis or segmental occlusion, with evaluation severely limited by motion artifact through this level. The internal carotid arteries are  patent from skullbase to carotid termini. Cavernous segment scratch sec cavernous and supraclinoid segment evaluation is limited by motion artifact. The M1 segments and left A1 segment are grossly patent. The right A1 segment was patent on the prior CTA but is not identified on this examination. Anterior circulation branch vessel evaluation is severely limited by motion artifact, although there is a decreased number of right MCA branch vessels corresponding to the large chronic infarct. IMPRESSION: 1. Incomplete MRI.  Acute left occipital lobe PCA infarct. 2. Large chronic right cerebral hemispheric infarct. 3. Motion degraded head MRA. P1 and proximal left P2 segments are grossly patent. Suspected right P2 occlusion or high-grade stenosis. 4. Suspected interval distal right vertebral artery occlusion. Electronically Signed   By: Sebastian Ache M.D.   On: 03/14/2016 13:47   Mr Maxine Glenn Head/brain ZO Cm  Result Date: 03/14/2016 CLINICAL DATA:  Acute onset visual loss.  Prior stroke. EXAM: MRI HEAD WITHOUT CONTRAST MRA HEAD WITHOUT CONTRAST TECHNIQUE: Multiplanar, multiecho pulse sequences of the  brain and surrounding structures were obtained without intravenous contrast. Angiographic images of the head were obtained using MRA technique without contrast. COMPARISON:  Head CT 03/13/2016 and CTA 03/17/2012 FINDINGS: MRI HEAD FINDINGS The examination had to be discontinued prior to completion due to patient claustrophobia. Axial and coronal diffusion, sagittal T1, and axial T2* gradient echo sequences were obtained. There is a 5 cm acute infarct in the left PCA territory involving the medial occipital lobe. A focus of microhemorrhage is noted near the posterior superior aspect of the infarct. A large chronic right cerebral hemispheric infarct is again seen with associated chronic blood products and ex vacuo enlargement of the right lateral ventricle. There are also small chronic infarcts in the thalami and cerebellum  bilaterally. There is moderate cerebral atrophy. No significant mass effect or evidence of extra-axial fluid collection on this limited study. MRA HEAD FINDINGS The study is moderately to severely motion degraded. The visualized distal left vertebral artery is patent. No flow related enhancement is seen in the distal right vertebral artery, which was patent but hypoplastic on the prior CTA. The basilar artery is patent without evidence of significant stenosis. The P1 segments and proximal left P2 are grossly patent. Signal dropout at the level of the proximal right P2 segment may reflect a high-grade stenosis or segmental occlusion, with evaluation severely limited by motion artifact through this level. The internal carotid arteries are patent from skullbase to carotid termini. Cavernous segment scratch sec cavernous and supraclinoid segment evaluation is limited by motion artifact. The M1 segments and left A1 segment are grossly patent. The right A1 segment was patent on the prior CTA but is not identified on this examination. Anterior circulation branch vessel evaluation is severely limited by motion artifact, although there is a decreased number of right MCA branch vessels corresponding to the large chronic infarct. IMPRESSION: 1. Incomplete MRI.  Acute left occipital lobe PCA infarct. 2. Large chronic right cerebral hemispheric infarct. 3. Motion degraded head MRA. P1 and proximal left P2 segments are grossly patent. Suspected right P2 occlusion or high-grade stenosis. 4. Suspected interval distal right vertebral artery occlusion. Electronically Signed   By: Sebastian AcheAllen  Grady M.D.   On: 03/14/2016 13:47     LOS: 1 day   Jeoffrey MassedGHIMIRE,SHANKER, MD  [facility] Pager:[phone]  If 7PM-7AM, please contact night-coverage [email or web address] Password [email or web address] 03/14/2016, 4:11 PM

## 2016-03-14 NOTE — Consult Note (Signed)
Admission H&P    Chief Complaint: Acute loss of vision.  HPI: Shaun Pugh is an 65 y.o. male with a history of large right MCA stroke 3 years ago, retention, hyperlipidemia, diabetes mellitus and coronary artery disease presenting with loss of residual vision acutely on 03/13/2016. He had left visual field defect from his previous stroke and developed sudden loss of vision on the right side with centrally total blindness except for minimal peripheral remaining vision on the left. He was last known well at 2:30 PM on 03/13/2016. There was no change in speech. There was no weakness involving right extremities normal right facial droop. ET scan of his head showed old large convexity infarction with encephalomalacia, as well as an area of hypodensity involving the medial left occipital region which was not present on his last CT scan in 2013. Subacute PCA infarction cannot be ruled out.  LSN: 2:30 PM on 03/13/2016 tPA Given: No: Beyond time window for treatment when he presented to the ED m[facility]in:  Past Medical History:  Diagnosis Date  . Acute pancreatitis 09/14/2011  . Atrophy of right kidney 09/15/2011  . CAD (coronary artery disease)    HX OF TWO HEART STENTS PLACED APPROX 10 OR 11 YRS AGO- STATES RELEASED BY CARDIOLOGIST- NO LONGER SEES  . Chronic back pain   . Diabetes ([place])   . High cholesterol   . Hypertension   . Kidney calculi 09/15/2011  . Myocardial infarction   . Seizures ([facility])    AFTER THE STROKE - BUT NONE SINCE  . Sleep apnea    STATES TRIED CPAP - DID NOT TOLERATE - RETURNED CPAP - DOES NOT HAVE ANYMORE  . Stented coronary artery approx 2004  . Stroke ([place]) 03/18/2012   LEFT SIDED WEAKNESS - STILL HAS SOME LUE>LLE WEAKNESS - ABLE TO AMBULATE WITH OR WITHOUT CANE BUT DOES NOT HAVE USE OF LEFT ARM  . TIA (transient ischemic attack)     Past Surgical History:  Procedure Laterality Date  . BACK SURGERY     LUMBAR SURGERIES x 4 - INCLUDING FUSIONS  . CARPAL TUNNEL REPAIR Left    . CHOLECYSTECTOMY  09/25/2011   Procedure: LAPAROSCOPIC CHOLECYSTECTOMY;  Surgeon: Donato Heinz, MD;  Location: [facility];  Service: General;  Laterality: N/A;  . CYSTOSCOPY WITH RETROGRADE PYELOGRAM, URETEROSCOPY AND STENT PLACEMENT Right 05/26/2013   Procedure: CYSTOSCOPY, RIGHT RETROGRADE PYELOGRAM, RIGHT URETEROSCOPY AND RIGHT STENT PLACEMENT, stone extraction, biopsy;  Surgeon: Ardis Hughs, MD;  Location: [facility];  Service: Urology;  Laterality: Right;  . HERNIA REPAIR     INGUINAL HERNIA REPARI MAYBE 30 YRS AGO  . MULTIPLE TOOTH EXTRACTIONS      Family History  Problem Relation Age of Onset  . Congestive Heart Failure Mother 62  . Diabetes Father 34  . Heart attack Father   . Anesthesia problems Neg Hx   . Hypotension Neg Hx   . Malignant hyperthermia Neg Hx   . Pseudochol deficiency Neg Hx    Social History:  reports that he quit smoking about 3 years ago. His smoking use included Cigarettes. He has a 45.00 pack-year smoking history. He has never used smokeless tobacco. He reports that he does not drink alcohol or use drugs.  Allergies: No Known Allergies  Medications Prior to Admission  Medication Sig Dispense Refill  . calcium-vitamin D (OSCAL WITH D) 500-200 MG-UNIT tablet Take 1 tablet by mouth daily with breakfast.    . clobetasol cream (TEMOVATE) 0.99 % Apply 1 application  topically 2 (two) times daily.    Marland Kitchen escitalopram (LEXAPRO) 5 MG tablet Take 1 tablet by mouth daily.  2  . fluticasone (CUTIVATE) 0.05 % cream Apply topically 2 (two) times daily.    Marland Kitchen glipiZIDE (GLUCOTROL XL) 10 MG 24 hr tablet Take 10 mg by mouth daily with breakfast.    . lisinopril-hydrochlorothiazide (PRINZIDE,ZESTORETIC) 10-12.5 MG tablet Take 1 tablet by mouth daily.  0  . metFORMIN (GLUCOPHAGE) 500 MG tablet Take 500 mg by mouth 2 (two) times daily with a meal.    . naproxen sodium (ALEVE) 220 MG tablet Take 220 mg by mouth 2 (two) times daily with a meal.    . Omega-3 Fatty Acids (FISH  OIL) 1000 MG CAPS Take 1 capsule by mouth daily.    . pantoprazole (PROTONIX) 40 MG tablet Take 1 tablet by mouth daily.  2  . vitamin B-12 (CYANOCOBALAMIN) 1000 MCG tablet Take 1,000 mcg by mouth daily.      ROS: History obtained from the patient  General ROS: negative for - chills, fatigue, fever, night sweats, weight gain or weight loss Psychological ROS: negative for - behavioral disorder, hallucinations, memory difficulties, mood swings or suicidal ideation Ophthalmic ROS: negative for - blurry vision, double vision, eye pain or loss of vision ENT ROS: negative for - epistaxis, nasal discharge, oral lesions, sore throat, tinnitus or vertigo Allergy and Immunology ROS: negative for - hives or itchy/watery eyes Hematological and Lymphatic ROS: negative for - bleeding problems, bruising or swollen lymph nodes Endocrine ROS: negative for - galactorrhea, hair pattern changes, polydipsia/polyuria or temperature intolerance Respiratory ROS: negative for - cough, hemoptysis, shortness of breath or wheezing Cardiovascular ROS: negative for - chest pain, dyspnea on exertion, edema or irregular heartbeat Gastrointestinal ROS: negative for - abdominal pain, diarrhea, hematemesis, nausea/vomiting or stool incontinence Genito-Urinary ROS: negative for - dysuria, hematuria, incontinence or urinary frequency/urgency Musculoskeletal ROS: negative for - joint swelling or muscular weakness Neurological ROS: as noted in HPI Dermatological ROS: negative for rash and skin lesion changes  Physical Examination: Blood pressure (!) 147/88, pulse 82, temperature 97.6 F (36.4 C), temperature source Oral, resp. rate 17, height 6' (1.829 m), weight 124.7 kg (275 lb), SpO2 98 %.  HEENT-  Normocephalic, no lesions, without obvious abnormality.  Normal external eye and conjunctiva.  Normal TM's bilaterally.  Normal auditory canals and external ears. Normal external nose, mucus membranes and septum.  Normal  pharynx. Neck supple with no masses, nodes, nodules or enlargement. Cardiovascular - regular rate and rhythm, S1, S2 normal, no murmur, click, rub or gallop Lungs - chest clear, no wheezing, rales, normal symmetric air entry Abdomen - soft, non-tender; bowel sounds normal; no masses,  no organomegaly Extremities - no joint deformities, effusion, or inflammation  Neurologic Examination: Mental Status: Alert, disoriented to current age and month, no acute distress.  Speech fluent without evidence of aphasia. Able to follow commands without difficulty. Cranial Nerves: II-patient was able to see light only, with slightly better acuity at the periphery on the left. III/IV/VI-Pupils were equal and reacted. Extraocular movements were full and conjugate.    V/VII-mild facial numbness on the left; equivocal left lower facial weakness. VIII-normal. X-normal speech. XI: trapezius strength/neck flexion strength normal bilaterally XII-midline tongue extension with normal strength. Motor: Mild weakness proximally and moderately severe weakness distally of left upper extremity with increased muscle tone; mild weakness proximally of left lower extremity with increase in extremity muscle tone; normal strength and tone of right extremities. Sensory: Reduced perception  of tactile sensation over left upper extremity compared to right; normal sensation in lower extremities. Deep Tendon Reflexes: Asymmetric with greater responses elicited from left extremities compared to right extremities. Plantars: Flexor on the right and extensor on the left Cerebellar: Normal finger-to-nose testing with use of right upper extremity, impaired coordination of left upper extremity. Carotid auscultation: Normal  Results for orders placed or performed during the hospital encounter of 03/13/16 (from the past 48 hour(s))  Protime-INR     Status: None   Collection Time: 03/13/16  7:31 PM  Result Value Ref Range   Prothrombin Time  12.4 11.4 - 15.2 seconds   INR 0.92   APTT     Status: None   Collection Time: 03/13/16  7:31 PM  Result Value Ref Range   aPTT 26 24 - 36 seconds  CBC     Status: Abnormal   Collection Time: 03/13/16  7:31 PM  Result Value Ref Range   WBC 15.8 (H) 4.0 - 10.5 K/uL   RBC 5.08 4.22 - 5.81 MIL/uL   Hemoglobin 15.5 13.0 - 17.0 g/dL   HCT 43.8 39.0 - 52.0 %   MCV 86.2 78.0 - 100.0 fL   MCH 30.5 26.0 - 34.0 pg   MCHC 35.4 30.0 - 36.0 g/dL   RDW 13.4 11.5 - 15.5 %   Platelets 287 150 - 400 K/uL  Differential     Status: Abnormal   Collection Time: 03/13/16  7:31 PM  Result Value Ref Range   Neutrophils Relative % 62 %   Lymphocytes Relative 26 %   Monocytes Relative 10 %   Eosinophils Relative 2 %   Basophils Relative 0 %   Neutro Abs 9.8 (H) 1.7 - 7.7 K/uL   Lymphs Abs 4.1 (H) 0.7 - 4.0 K/uL   Monocytes Absolute 1.6 (H) 0.1 - 1.0 K/uL   Eosinophils Absolute 0.3 0.0 - 0.7 K/uL   Basophils Absolute 0.0 0.0 - 0.1 K/uL   WBC Morphology ATYPICAL LYMPHOCYTES   Comprehensive metabolic panel     Status: Abnormal   Collection Time: 03/13/16  7:31 PM  Result Value Ref Range   Sodium 133 (L) 135 - 145 mmol/L   Potassium 3.9 3.5 - 5.1 mmol/L   Chloride 99 (L) 101 - 111 mmol/L   CO2 26 22 - 32 mmol/L   Glucose, Bld 119 (H) 65 - 99 mg/dL   BUN 21 (H) 6 - 20 mg/dL   Creatinine, Ser 1.43 (H) 0.61 - 1.24 mg/dL   Calcium 9.4 8.9 - 10.3 mg/dL   Total Protein 8.4 (H) 6.5 - 8.1 g/dL   Albumin 4.2 3.5 - 5.0 g/dL   AST 23 15 - 41 U/L   ALT 33 17 - 63 U/L   Alkaline Phosphatase 81 38 - 126 U/L   Total Bilirubin 0.5 0.3 - 1.2 mg/dL   GFR calc non Af Amer 50 (L) >60 mL/min   GFR calc Af Amer 57 (L) >60 mL/min    Comment: (NOTE) The eGFR has been calculated using the CKD EPI equation. This calculation has not been validated in all clinical situations. eGFR's persistently <60 mL/min signify possible Chronic Kidney Disease.    Anion gap 8 5 - 15  Troponin I     Status: None   Collection Time:  03/13/16  7:31 PM  Result Value Ref Range   Troponin I <0.03 <0.03 ng/mL  CBG monitoring, ED     Status: Abnormal   Collection Time: 03/13/16  7:39  PM  Result Value Ref Range   Glucose-Capillary 134 (H) 65 - 99 mg/dL  I-Stat Chem 8, ED     Status: Abnormal   Collection Time: 03/13/16  7:47 PM  Result Value Ref Range   Sodium 139 135 - 145 mmol/L   Potassium 4.0 3.5 - 5.1 mmol/L   Chloride 99 (L) 101 - 111 mmol/L   BUN 21 (H) 6 - 20 mg/dL   Creatinine, Ser 1.50 (H) 0.61 - 1.24 mg/dL   Glucose, Bld 119 (H) 65 - 99 mg/dL   Calcium, Ion 1.21 1.15 - 1.40 mmol/L   TCO2 26 0 - 100 mmol/L   Hemoglobin 15.3 13.0 - 17.0 g/dL   HCT 45.0 39.0 - 52.0 %  Urinalysis, Routine w reflex microscopic (not at [facility])     Status: None   Collection Time: 03/13/16  8:22 PM  Result Value Ref Range   Color, Urine YELLOW YELLOW   APPearance CLEAR CLEAR   Specific Gravity, Urine 1.010 1.005 - 1.030   pH 6.0 5.0 - 8.0   Glucose, UA NEGATIVE NEGATIVE mg/dL   Hgb urine dipstick NEGATIVE NEGATIVE   Bilirubin Urine NEGATIVE NEGATIVE   Ketones, ur NEGATIVE NEGATIVE mg/dL   Protein, ur NEGATIVE NEGATIVE mg/dL   Nitrite NEGATIVE NEGATIVE   Leukocytes, UA NEGATIVE NEGATIVE    Comment: MICROSCOPIC NOT DONE ON URINES WITH NEGATIVE PROTEIN, BLOOD, LEUKOCYTES, NITRITE, OR GLUCOSE <1000 mg/dL.   Ct Head Wo Contrast  Result Date: 03/13/2016 CLINICAL DATA:  Headache EXAM: CT HEAD WITHOUT CONTRAST TECHNIQUE: Contiguous axial images were obtained from the base of the skull through the vertex without intravenous contrast. COMPARISON:  03/20/2012, 03/17/2012 FINDINGS: Brain: There is no acute hemorrhage identified. There is no focal mass, mass effect or midline shift. Evidence of large old right-sided infarct involving the occipital, temporal, frontal and parietal lobes. Mild associated distortion of right lateral ventricle. There is hypodensity within the left paracentral occipital lobe, new compared with 2013 study. No  significant surrounding mass effect. Periventricular white matter hypodensities consistent with small vessel disease. Old appearing lacunar infarcts within the bilateral the thalamus. Old small infarct in the right cerebellar hemisphere. Vascular: No hyperdense vessels. Mild calcifications within the carotid arteries at the skullbase. Skull: No fracture. Minimal opacifications of the inferior mastoids. Sinuses/Orbits: Mild mucosal thickening in paranasal sinuses. No acute orbital abnormality. Other: None IMPRESSION: 1. No acute hemorrhage or focal mass lesion visualized. 2. Evidence of old large right-sided hemispheric infarct. 3. Focal hypodense area within the left parasagittal occipital lobe, new since 2013, likely old given absence of significant surrounding mass effect, however MRI could be obtained to confirm that this does not represent subacute ischemic change. 4. Old lacunar infarcts within the thalamus. Old right cerebellar infarct. Electronically Signed   By: Donavan Foil M.D.   On: 03/13/2016 20:23    Assessment: 65 y.o. male with multiple risk factors for stroke as well as previous cerebral infarction presenting with probable new onset right PCA territory ischemic infarction with loss of vision involving right visual field, in addition to left visual field defect from previous stroke.  Stroke Risk Factors - diabetes mellitus, hyperlipidemia and hypertension  Plan: 1. HgbA1c, fasting lipid panel 2. MRI, MRA  of the brain without contrast 3. PT consult, OT consult, Speech consult 4. Echocardiogram 5. Carotid dopplers 6. Prophylactic therapy-Antiplatelet med: Aspirin - 7. Risk factor modification 8. Telemetry monitoring  C.R. Nicole Kindred, MD [facility] [phone]  03/14/2016, 1:26 AM

## 2016-03-14 NOTE — Progress Notes (Addendum)
Patient has not urinated since admission. Bladder scan showed 125 cc. Pt states he is experiencing discomfort.during report. Dr.Ghimire made aware. Will continue to monitor

## 2016-03-14 NOTE — Progress Notes (Signed)
Return call from Dr. Jerral RalphGhimire orders to do another bladder scan.  Will complete orders and continue to monitor.  [employer] Derric Dealmeida, RN

## 2016-03-14 NOTE — Progress Notes (Signed)
STROKE TEAM PROGRESS NOTE   HISTORY OF PRESENT ILLNESS (per record) Shaun Pugh is an 65 y.o. male with a history of large right MCA stroke 3 years ago, hypertension, hyperlipidemia, diabetes mellitus and coronary artery disease presenting with loss of residual vision acutely on 03/13/2016. He had left visual field defect from his previous stroke and developed sudden loss of vision on the right side with centrally total blindness except for minimal peripheral remaining vision on the left. He was last known well at 2:30 PM on 03/13/2016. There was no change in speech. There was no weakness involving right extremities normal right facial droop. CT scan of his head showed old large convexity infarction with encephalomalacia, as well as an area of hypodensity involving the medial left occipital region which was not present on his last CT scan in 2013. Subacute PCA infarction cannot be ruled out.  LSN: 2:30 PM on 03/13/2016 tPA Given: No: Beyond time window for treatment when he presented to the ED [facility]:   SUBJECTIVE (INTERVAL HISTORY) His family was at the bedside.  Overall he feels his condition is unchanged.  He still complains of significant visual impairment   OBJECTIVE Temp:  [97.3 F (36.3 C)-98 F (36.7 C)] 98 F (36.7 C) (11/04 1448) Pulse Rate:  [69-90] 87 (11/04 1448) Cardiac Rhythm: Normal sinus rhythm (11/04 0700) Resp:  [17-23] 18 (11/04 1448) BP: (100-166)/(44-110) 100/70 (11/04 1448) SpO2:  [95 %-99 %] 99 % (11/04 1448) Weight:  [121.2 kg (267 lb 1.6 oz)-124.7 kg (275 lb)] 121.2 kg (267 lb 1.6 oz) (11/04 0105)  CBC:   Recent Labs Lab 03/13/16 1931 03/13/16 1947  WBC 15.8*  --   NEUTROABS 9.8*  --   HGB 15.5 15.3  HCT 43.8 45.0  MCV 86.2  --   PLT 287  --     Basic Metabolic Panel:   Recent Labs Lab 03/13/16 1931 03/13/16 1947  NA 133* 139  K 3.9 4.0  CL 99* 99*  CO2 26  --   GLUCOSE 119* 119*  BUN 21* 21*  CREATININE 1.43* 1.50*  CALCIUM 9.4  --      Lipid Panel:     Component Value Date/Time   CHOL 189 03/14/2016 0106   TRIG 255 (H) 03/14/2016 0106   HDL 25 (L) 03/14/2016 0106   CHOLHDL 7.6 03/14/2016 0106   VLDL 51 (H) 03/14/2016 0106   LDLCALC 113 (H) 03/14/2016 0106   HgbA1c:  Lab Results  Component Value Date   HGBA1C 5.9 (H) 03/18/2012   Urine Drug Screen:     Component Value Date/Time   LABOPIA NONE DETECTED 03/14/2016 0909   COCAINSCRNUR NONE DETECTED 03/14/2016 0909   LABBENZ NONE DETECTED 03/14/2016 0909   AMPHETMU NONE DETECTED 03/14/2016 0909   THCU NONE DETECTED 03/14/2016 0909   LABBARB NONE DETECTED 03/14/2016 0909      IMAGING  Ct Head Wo Contrast  03/13/2016 1. No acute hemorrhage or focal mass lesion visualized.  2. Evidence of old large right-sided hemispheric infarct.  3. Focal hypodense area within the left parasagittal occipital lobe, new since 2013, likely old given absence of significant surrounding mass effect, however MRI could be obtained to confirm that this does not represent subacute ischemic change.  4. Old lacunar infarcts within the thalamus. Old right cerebellar infarct.    MR MRA Head / Brain Wo Contrast (motion degraded) 03/14/2016 1. Incomplete MRI.  Acute left occipital lobe PCA infarct. 2. Large chronic right cerebral hemispheric infarct. 3. Motion degraded  head MRA. P1 and proximal left P2 segments are grossly patent. Suspected right P2 occlusion or high-grade stenosis. 4. Suspected interval distal right vertebral artery occlusion.    PHYSICAL EXAM HEENT-  Normocephalic, no lesions, without obvious abnormality.  Normal external eye and conjunctiva.  Normal TM's bilaterally.  Normal auditory canals and external ears. Normal external nose, mucus membranes and septum.  Normal pharynx. Neck supple with no masses, nodes, nodules or enlargement. Cardiovascular - regular rate and rhythm, S1, S2 normal, no murmur, click, rub or gallop Lungs - chest clear, no wheezing, rales,  normal symmetric air entry Abdomen - soft, non-tender; bowel sounds normal; no masses,  no organomegaly Extremities - no joint deformities, effusion, or inflammation  Neurologic Examination: Mental Status: Alert, disoriented to current age and month, no acute distress.  Speech fluent without evidence of aphasia. Able to follow commands without difficulty. Cranial Nerves: II-patient was able to see light only, with slightly better acuity at the periphery on the left. III/IV/VI-Pupils were equal and reacted. Extraocular movements were full and conjugate.    V/VII-mild facial numbness on the left; equivocal left lower facial weakness. VIII-normal. X-normal speech. XI: trapezius strength/neck flexion strength normal bilaterally XII-midline tongue extension with normal strength. Motor: Mild weakness proximally and moderately severe weakness distally of left upper extremity with increased muscle tone; mild weakness proximally of left lower extremity with increase in extremity muscle tone; normal strength and tone of right extremities. Sensory: Reduced perception of tactile sensation over left upper extremity compared to right; normal sensation in lower extremities. Deep Tendon Reflexes: Asymmetric with greater responses elicited from left extremities compared to right extremities. Plantars: Flexor on the right and extensor on the left Cerebellar: Normal finger-to-nose testing with use of right upper extremity, impaired coordination of left upper extremity. Carotid auscultation: Normal    ASSESSMENT/PLAN [name] is a 65 y.o. male with history of TIAs, previous stroke, coronary artery disease with previous Mi, cardiac stent, diabetes mellitus, atrophy of the right kidney, acute pancreatitis, hyperlipidemia, obstructive sleep apnea, hypertension, and seizures, presenting with visual deficits. He did not receive IV t-PA due to late presentation.  Stroke:  Dominant infarct probably embolic  from an unknown source.  Resultant  Right homonymous hemianopia; this is superimposed on chronic visualmimpairment  MRI -  Acute left occipital lobe PCA infarct.  MRA - Suspected right P2 occlusion or high-grade stenosis. Suspected interval distal right vertebral artery occlusion.  Carotid Doppler -  1-39% ICA plaquing.  Vertebral artery flow is antegrade.   2D Echo - pending  LDL 113  HgbA1c pending  VTE prophylaxis - Lovenox Diet Heart Room service appropriate? Yes; Fluid consistency: Thin  No antithrombotic prior to admission, now on aspirin 325 mg daily  Patient counseled to be compliant with his antithrombotic medications  Ongoing aggressive stroke risk factor management  Therapy recommendations:  pending  Disposition: Pending  Hypertension  Occasionally somewhat low blood pressures  Permissive hypertension (OK if < 220/120) but gradually normalize in 5-7 days  Long-term BP goal normotensive  Hyperlipidemia  Home meds:  No lipid lowering medications prior to admission  LDL 113, goal < 70  Now on Lipitor 40 mg daily  Continue statin at discharge  Diabetes  HgbA1c pending, goal < 7.0  Controlled  Other Stroke Risk Factors  Advanced age  Former smoker - quit 3 years ago  Obesity, Body mass index is 35.24 kg/m., recommend weight loss, diet and exercise as appropriate   Hx stroke/TIA  Coronary artery disease  Obstructive sleep apnea  Other Active Problems  BUN - 21 ; creatinine - 1.5  Hospital day # 1  ATTENDING NOTE: Patient was seen and examined by me personally. Documentation reflects findings. The laboratory and radiographic studies reviewed by me. ROS completed by me personally and pertinent positives fully documented   Condition: stable  Assessment and plan completed by me personally and fully documented above. Plans/Recommendations include:     ECHO pending at time of exam  Statin therapy as above  Rehab eval and  treat  SIGNED BY: Dr. Sula Soda    To contact Stroke Continuity provider, please refer to [email or web address]. After hours, contact General Neurology

## 2016-03-15 DIAGNOSIS — I639 Cerebral infarction, unspecified: Secondary | ICD-10-CM

## 2016-03-15 LAB — CBC WITH DIFFERENTIAL/PLATELET
Basophils Absolute: 0.1 10*3/uL (ref 0.0–0.1)
Basophils Relative: 1 %
EOS ABS: 0.3 10*3/uL (ref 0.0–0.7)
EOS PCT: 3 %
HCT: 40.2 % (ref 39.0–52.0)
Hemoglobin: 13.8 g/dL (ref 13.0–17.0)
LYMPHS ABS: 2.6 10*3/uL (ref 0.7–4.0)
Lymphocytes Relative: 22 %
MCH: 29.4 pg (ref 26.0–34.0)
MCHC: 34.3 g/dL (ref 30.0–36.0)
MCV: 85.7 fL (ref 78.0–100.0)
MONOS PCT: 9 %
Monocytes Absolute: 1 10*3/uL (ref 0.1–1.0)
Neutro Abs: 7.8 10*3/uL — ABNORMAL HIGH (ref 1.7–7.7)
Neutrophils Relative %: 65 %
PLATELETS: 199 10*3/uL (ref 150–400)
RBC: 4.69 MIL/uL (ref 4.22–5.81)
RDW: 13.5 % (ref 11.5–15.5)
WBC: 11.7 10*3/uL — ABNORMAL HIGH (ref 4.0–10.5)

## 2016-03-15 LAB — GLUCOSE, CAPILLARY
GLUCOSE-CAPILLARY: 165 mg/dL — AB (ref 65–99)
GLUCOSE-CAPILLARY: 139 mg/dL — AB (ref 65–99)
GLUCOSE-CAPILLARY: 198 mg/dL — AB (ref 65–99)
Glucose-Capillary: 114 mg/dL — ABNORMAL HIGH (ref 65–99)

## 2016-03-15 LAB — BASIC METABOLIC PANEL
Anion gap: 9 (ref 5–15)
BUN: 18 mg/dL (ref 6–20)
CO2: 24 mmol/L (ref 22–32)
CREATININE: 1.46 mg/dL — AB (ref 0.61–1.24)
Calcium: 9.3 mg/dL (ref 8.9–10.3)
Chloride: 103 mmol/L (ref 101–111)
GFR calc Af Amer: 56 mL/min — ABNORMAL LOW (ref >60)
GFR, EST NON AFRICAN AMERICAN: 48 mL/min — AB (ref >60)
Glucose, Bld: 143 mg/dL — ABNORMAL HIGH (ref 65–99)
Potassium: 4.1 mmol/L (ref 3.5–5.1)
SODIUM: 136 mmol/L (ref 135–145)

## 2016-03-15 LAB — HEMOGLOBIN A1C
Hgb A1c MFr Bld: 6.2 % — ABNORMAL HIGH (ref 4.8–5.6)
Mean Plasma Glucose: 131 mg/dL

## 2016-03-15 MED ORDER — ATORVASTATIN CALCIUM 20 MG PO TABS
20.0000 mg | ORAL_TABLET | Freq: Every day | ORAL | Status: DC
  Administered 2016-03-16: 20 mg via ORAL
  Filled 2016-03-15: qty 1

## 2016-03-15 NOTE — Progress Notes (Signed)
PROGRESS NOTE        PATIENT DETAILS Name: Shaun Pugh Age: 65 y.o. Sex: male Date of Birth: 01/23/1951 Admit Date: 03/13/2016 Admitting Physician [name], MD ZOX:[id number] [name], MD  Brief Narrative: Patient is a 65 y.o. male with prior history of CVA with resultant left eye blindness and hemiparesis (LUE>LLE) presented with sudden onset of visual loss from her right eye. MRI of the brain confirms left occipital PCA infarct.  Subjective: Some improvement in his vision-able to now get a better outline of things in his visual field-able to see my 2 fingers that I held up this morning.   Assessment/Plan: Acute CVA: Presenting as right-sided visual loss, MRI of the brain confirms acute left occipital PCA infarct, it also shows a large chronic right cerebral hemispheric infarct. Patient already had very poor vision in his left eye, and unfortunately developed right eye visual loss-pain-free that has improved somewhat today.Has chronic left-sided hemiparesis (LUE>LLE) that is essentially unchanged. Carotid Doppler does not show any significant stenosis. Await echo and A1c. LDL elevated at 113- per spouse-he has been intolerant to several statins in the past and are requesting not to be restarted on any statins at this time. Await further recommendations from stroke service.  Hypertension: Allow permissive hypertension in the setting of acute CVA, resume antihypertensives over the next few days.  Type 2 diabetes: Continue to hold oral hypoglycemics, A1c pending. CBGs stable with SSI.  Chronic kidney disease stage III: Creatinine close to usual baseline, follow  Leukocytosis: No indication of infection at this time, follow  Chronic urinary retention: Per spouse-the patient has had issues with urinary retention for a while-and has had urology evaluation-and likely has a neurogenic bladder. He is now able to void at the bedside commode-continue to follow serial bladder  ultrasounds. Hopefully we can avoid placing a Foley catheter at this time.  DVT Prophylaxis: Prophylactic Lovenox   Code Status: Full code   Family Communication: Spouse at bedside  Disposition Plan: Remain inpatient-may require SNF on discharge-await PT eval  Antimicrobial agents: None  Procedures: Carotid/Dopplers 11/4>>-39% ICA plaquing.  Vertebral artery flow is antegrade  CONSULTS:  neurology  Time spent: 25- minutes-Greater than 50% of this time was spent in counseling, explanation of diagnosis, planning of further management, and coordination of care.  MEDICATIONS: Anti-infectives    None      Scheduled Meds: . aspirin  300 mg Rectal Daily   Or  . aspirin  325 mg Oral Daily  . atorvastatin  40 mg Oral q1800  . enoxaparin (LOVENOX) injection  40 mg Subcutaneous Q24H  . escitalopram  5 mg Oral Daily  . insulin aspart  0-15 Units Subcutaneous TID WC  . pantoprazole  40 mg Oral Daily   Continuous Infusions:  PRN Meds:.acetaminophen **OR** acetaminophen, senna-docusate   PHYSICAL EXAM: Vital signs: Vitals:   03/14/16 1448 03/14/16 2159 03/14/16 2227 03/15/16 0506  BP: 100/70 (!) 167/83 135/69 109/73  Pulse: 87 92 86 76  Resp: 18 (!) 22 18 18   Temp: 98 F (36.7 C) 98 F (36.7 C)  97.4 F (36.3 C)  TempSrc: Oral Oral  Oral  SpO2: 99% 98%  97%  Weight:      Height:       Filed Weights   03/13/16 1915 03/14/16 0105  Weight: 124.7 kg (275 lb) 121.2 kg (267 lb 1.6 oz)  Body mass index is 35.24 kg/m.   General appearance :Awake, alert, not in any distress. Speech Clear. Chronically sick appearing. Eyes:Now able to make out outlines of figures HEENT: Atraumatic and Normocephalic Neck: supple, no JVD. No cervical lymphadenopathy. No thyromegaly Resp:Good air entry bilaterally, no added sounds  CVS: S1 S2 regular, no murmurs.  GI: Bowel sounds present, Non tender and not distended with no gaurding, rigidity or rebound.No organomegaly Extremities:  B/L Lower Ext shows no edema, both legs are warm to touch Neurology:  Left upper extremity, around 3+/5, left lower extremity 4/5-right upper/right lower around 5/5 Psychiatric: Alert and oriented x 3. Normal mood. Musculoskeletal:No digital cyanosis Skin:No Rash, warm and dry Wounds:N/A  I have personally reviewed following labs and imaging studies  LABORATORY DATA: CBC:  Recent Labs Lab 03/13/16 1931 03/13/16 1947 03/15/16 0518  WBC 15.8*  --  11.7*  NEUTROABS 9.8*  --  7.8*  HGB 15.5 15.3 13.8  HCT 43.8 45.0 40.2  MCV 86.2  --  85.7  PLT 287  --  199    Basic Metabolic Panel:  Recent Labs Lab 03/13/16 1931 03/13/16 1947 03/15/16 0518  NA 133* 139 136  K 3.9 4.0 4.1  CL 99* 99* 103  CO2 26  --  24  GLUCOSE 119* 119* 143*  BUN 21* 21* 18  CREATININE 1.43* 1.50* 1.46*  CALCIUM 9.4  --  9.3    GFR: Estimated Creatinine Clearance: 67.9 mL/min (by C-G formula based on SCr of 1.46 mg/dL (H)).  Liver Function Tests:  Recent Labs Lab 03/13/16 1931  AST 23  ALT 33  ALKPHOS 81  BILITOT 0.5  PROT 8.4*  ALBUMIN 4.2   No results for input(s): LIPASE, AMYLASE in the last 168 hours. No results for input(s): AMMONIA in the last 168 hours.  Coagulation Profile:  Recent Labs Lab 03/13/16 1931  INR 0.92    Cardiac Enzymes:  Recent Labs Lab 03/13/16 1931  TROPONINI <0.03    BNP (last 3 results) No results for input(s): PROBNP in the last 8760 hours.  HbA1C: No results for input(s): HGBA1C in the last 72 hours.  CBG:  Recent Labs Lab 03/14/16 1154 03/14/16 1705 03/14/16 2155 03/15/16 0804 03/15/16 1229  GLUCAP 133* 187* 117* 165* 114*    Lipid Profile:  Recent Labs  03/14/16 0106  CHOL 189  HDL 25*  LDLCALC 113*  TRIG 255*  CHOLHDL 7.6    Thyroid Function Tests: No results for input(s): TSH, T4TOTAL, FREET4, T3FREE, THYROIDAB in the last 72 hours.  Anemia Panel: No results for input(s): VITAMINB12, FOLATE, FERRITIN, TIBC,  IRON, RETICCTPCT in the last 72 hours.  Urine analysis:    Component Value Date/Time   COLORURINE YELLOW 03/13/2016 2022   APPEARANCEUR CLEAR 03/13/2016 2022   LABSPEC 1.010 03/13/2016 2022   PHURINE 6.0 03/13/2016 2022   GLUCOSEU NEGATIVE 03/13/2016 2022   HGBUR NEGATIVE 03/13/2016 2022   BILIRUBINUR NEGATIVE 03/13/2016 2022   KETONESUR NEGATIVE 03/13/2016 2022   PROTEINUR NEGATIVE 03/13/2016 2022   UROBILINOGEN 0.2 05/20/2013 2220   NITRITE NEGATIVE 03/13/2016 2022   LEUKOCYTESUR NEGATIVE 03/13/2016 2022    Sepsis Labs: Lactic Acid, Venous No results found for: LATICACIDVEN  MICROBIOLOGY: No results found for this or any previous visit (from the past 240 hour(s)).  RADIOLOGY STUDIES/RESULTS: Ct Head Wo Contrast  Result Date: 03/13/2016 CLINICAL DATA:  Headache EXAM: CT HEAD WITHOUT CONTRAST TECHNIQUE: Contiguous axial images were obtained from the base of the skull through the vertex without  intravenous contrast. COMPARISON:  03/20/2012, 03/17/2012 FINDINGS: Brain: There is no acute hemorrhage identified. There is no focal mass, mass effect or midline shift. Evidence of large old right-sided infarct involving the occipital, temporal, frontal and parietal lobes. Mild associated distortion of right lateral ventricle. There is hypodensity within the left paracentral occipital lobe, new compared with 2013 study. No significant surrounding mass effect. Periventricular white matter hypodensities consistent with small vessel disease. Old appearing lacunar infarcts within the bilateral the thalamus. Old small infarct in the right cerebellar hemisphere. Vascular: No hyperdense vessels. Mild calcifications within the carotid arteries at the skullbase. Skull: No fracture. Minimal opacifications of the inferior mastoids. Sinuses/Orbits: Mild mucosal thickening in paranasal sinuses. No acute orbital abnormality. Other: None IMPRESSION: 1. No acute hemorrhage or focal mass lesion visualized. 2.  Evidence of old large right-sided hemispheric infarct. 3. Focal hypodense area within the left parasagittal occipital lobe, new since 2013, likely old given absence of significant surrounding mass effect, however MRI could be obtained to confirm that this does not represent subacute ischemic change. 4. Old lacunar infarcts within the thalamus. Old right cerebellar infarct. Electronically Signed   By: Jasmine PangKim  Fujinaga M.D.   On: 03/13/2016 20:23   Mr Brain Wo Contrast  Result Date: 03/14/2016 CLINICAL DATA:  Acute onset visual loss.  Prior stroke. EXAM: MRI HEAD WITHOUT CONTRAST MRA HEAD WITHOUT CONTRAST TECHNIQUE: Multiplanar, multiecho pulse sequences of the brain and surrounding structures were obtained without intravenous contrast. Angiographic images of the head were obtained using MRA technique without contrast. COMPARISON:  Head CT 03/13/2016 and CTA 03/17/2012 FINDINGS: MRI HEAD FINDINGS The examination had to be discontinued prior to completion due to patient claustrophobia. Axial and coronal diffusion, sagittal T1, and axial T2* gradient echo sequences were obtained. There is a 5 cm acute infarct in the left PCA territory involving the medial occipital lobe. A focus of microhemorrhage is noted near the posterior superior aspect of the infarct. A large chronic right cerebral hemispheric infarct is again seen with associated chronic blood products and ex vacuo enlargement of the right lateral ventricle. There are also small chronic infarcts in the thalami and cerebellum bilaterally. There is moderate cerebral atrophy. No significant mass effect or evidence of extra-axial fluid collection on this limited study. MRA HEAD FINDINGS The study is moderately to severely motion degraded. The visualized distal left vertebral artery is patent. No flow related enhancement is seen in the distal right vertebral artery, which was patent but hypoplastic on the prior CTA. The basilar artery is patent without evidence of  significant stenosis. The P1 segments and proximal left P2 are grossly patent. Signal dropout at the level of the proximal right P2 segment may reflect a high-grade stenosis or segmental occlusion, with evaluation severely limited by motion artifact through this level. The internal carotid arteries are patent from skullbase to carotid termini. Cavernous segment scratch sec cavernous and supraclinoid segment evaluation is limited by motion artifact. The M1 segments and left A1 segment are grossly patent. The right A1 segment was patent on the prior CTA but is not identified on this examination. Anterior circulation branch vessel evaluation is severely limited by motion artifact, although there is a decreased number of right MCA branch vessels corresponding to the large chronic infarct. IMPRESSION: 1. Incomplete MRI.  Acute left occipital lobe PCA infarct. 2. Large chronic right cerebral hemispheric infarct. 3. Motion degraded head MRA. P1 and proximal left P2 segments are grossly patent. Suspected right P2 occlusion or high-grade stenosis. 4. Suspected interval  distal right vertebral artery occlusion. Electronically Signed   By: Sebastian Ache M.D.   On: 03/14/2016 13:47   Mr Maxine Glenn Head/brain ZO Cm  Result Date: 03/14/2016 CLINICAL DATA:  Acute onset visual loss.  Prior stroke. EXAM: MRI HEAD WITHOUT CONTRAST MRA HEAD WITHOUT CONTRAST TECHNIQUE: Multiplanar, multiecho pulse sequences of the brain and surrounding structures were obtained without intravenous contrast. Angiographic images of the head were obtained using MRA technique without contrast. COMPARISON:  Head CT 03/13/2016 and CTA 03/17/2012 FINDINGS: MRI HEAD FINDINGS The examination had to be discontinued prior to completion due to patient claustrophobia. Axial and coronal diffusion, sagittal T1, and axial T2* gradient echo sequences were obtained. There is a 5 cm acute infarct in the left PCA territory involving the medial occipital lobe. A focus of  microhemorrhage is noted near the posterior superior aspect of the infarct. A large chronic right cerebral hemispheric infarct is again seen with associated chronic blood products and ex vacuo enlargement of the right lateral ventricle. There are also small chronic infarcts in the thalami and cerebellum bilaterally. There is moderate cerebral atrophy. No significant mass effect or evidence of extra-axial fluid collection on this limited study. MRA HEAD FINDINGS The study is moderately to severely motion degraded. The visualized distal left vertebral artery is patent. No flow related enhancement is seen in the distal right vertebral artery, which was patent but hypoplastic on the prior CTA. The basilar artery is patent without evidence of significant stenosis. The P1 segments and proximal left P2 are grossly patent. Signal dropout at the level of the proximal right P2 segment may reflect a high-grade stenosis or segmental occlusion, with evaluation severely limited by motion artifact through this level. The internal carotid arteries are patent from skullbase to carotid termini. Cavernous segment scratch sec cavernous and supraclinoid segment evaluation is limited by motion artifact. The M1 segments and left A1 segment are grossly patent. The right A1 segment was patent on the prior CTA but is not identified on this examination. Anterior circulation branch vessel evaluation is severely limited by motion artifact, although there is a decreased number of right MCA branch vessels corresponding to the large chronic infarct. IMPRESSION: 1. Incomplete MRI.  Acute left occipital lobe PCA infarct. 2. Large chronic right cerebral hemispheric infarct. 3. Motion degraded head MRA. P1 and proximal left P2 segments are grossly patent. Suspected right P2 occlusion or high-grade stenosis. 4. Suspected interval distal right vertebral artery occlusion. Electronically Signed   By: Sebastian Ache M.D.   On: 03/14/2016 13:47     LOS: 2  days   Jeoffrey Massed, MD  [facility] Pager:[phone]  If 7PM-7AM, please contact night-coverage [email or web address] Password [email or web address] 03/15/2016, 1:25 PM

## 2016-03-15 NOTE — Progress Notes (Signed)
STROKE TEAM PROGRESS NOTE   HISTORY OF PRESENT ILLNESS (per record) Shaun Pugh is an 65 y.o. male with a history of large right MCA stroke 3 years ago, hypertension, hyperlipidemia, diabetes mellitus and coronary artery disease presenting with loss of residual vision acutely on 03/13/2016. He had left visual field defect from his previous stroke and developed sudden loss of vision on the right side with centrally total blindness except for minimal peripheral remaining vision on the left. He was last known well at 2:30 PM on 03/13/2016. There was no change in speech. There was no weakness involving right extremities normal right facial droop. CT scan of his head showed old large convexity infarction with encephalomalacia, as well as an area of hypodensity involving the medial left occipital region which was not present on his last CT scan in 2013. Subacute PCA infarction cannot be ruled out.  LSN: 2:30 PM on 03/13/2016 tPA Given: No: Beyond time window for treatment when he presented to the ED mRankin:   SUBJECTIVE (INTERVAL HISTORY) His wife was at the bedside.  Overall he feels his condition is unchanged.  He still complains of significant visual impairment.  PT/OT at bedside doing evaluation   OBJECTIVE Temp:  [97.4 F (36.3 C)-98.4 F (36.9 C)] 98.4 F (36.9 C) (11/05 1502) Pulse Rate:  [76-92] 88 (11/05 1502) Cardiac Rhythm: Normal sinus rhythm;Bundle branch block (11/05 0700) Resp:  [18-22] 18 (11/05 1502) BP: (109-167)/(68-83) 119/68 (11/05 1502) SpO2:  [96 %-98 %] 96 % (11/05 1502)  CBC:   Recent Labs Lab 03/13/16 1931 03/13/16 1947 03/15/16 0518  WBC 15.8*  --  11.7*  NEUTROABS 9.8*  --  7.8*  HGB 15.5 15.3 13.8  HCT 43.8 45.0 40.2  MCV 86.2  --  85.7  PLT 287  --  199    Basic Metabolic Panel:   Recent Labs Lab 03/13/16 1931 03/13/16 1947 03/15/16 0518  NA 133* 139 136  K 3.9 4.0 4.1  CL 99* 99* 103  CO2 26  --  24  GLUCOSE 119* 119* 143*  BUN 21* 21* 18   CREATININE 1.43* 1.50* 1.46*  CALCIUM 9.4  --  9.3    Lipid Panel:     Component Value Date/Time   CHOL 189 03/14/2016 0106   TRIG 255 (H) 03/14/2016 0106   HDL 25 (L) 03/14/2016 0106   CHOLHDL 7.6 03/14/2016 0106   VLDL 51 (H) 03/14/2016 0106   LDLCALC 113 (H) 03/14/2016 0106   HgbA1c:  Lab Results  Component Value Date   HGBA1C 6.2 (H) 03/14/2016   Urine Drug Screen:     Component Value Date/Time   LABOPIA NONE DETECTED 03/14/2016 0909   COCAINSCRNUR NONE DETECTED 03/14/2016 0909   LABBENZ NONE DETECTED 03/14/2016 0909   AMPHETMU NONE DETECTED 03/14/2016 0909   THCU NONE DETECTED 03/14/2016 0909   LABBARB NONE DETECTED 03/14/2016 0909      IMAGING  Ct Head Wo Contrast 03/13/2016 1. No acute hemorrhage or focal mass lesion visualized.  2. Evidence of old large right-sided hemispheric infarct.  3. Focal hypodense area within the left parasagittal occipital lobe, new since 2013, likely old given absence of significant surrounding mass effect, however MRI could be obtained to confirm that this does not represent subacute ischemic change.  4. Old lacunar infarcts within the thalamus. Old right cerebellar infarct.    MR MRA Head / Brain Wo Contrast (motion degraded) 03/14/2016 1. Incomplete MRI.  Acute left occipital lobe PCA infarct. 2. Large chronic right  cerebral hemispheric infarct. 3. Motion degraded head MRA. P1 and proximal left P2 segments are grossly patent. Suspected right P2 occlusion or high-grade stenosis. 4. Suspected interval distal right vertebral artery occlusion.    Neurologic Examination:  Exam brief through observation of PT/OT Mental Status: Alert, oriented to self, place and year.  Speech fluent without evidence of aphasia. Able to follow commands without difficulty.  Cranial Nerves: II-patient was able to see light and some finger movement with OT.  He also saw the color of the safety belt.  He still describes visual field cuts consistent  with chronic and now acute stroke V/VII-mild facial numbness on the left; equivocal left lower facial weakness. VIII-normal. X-normal speech. XI: trapezius strength/neck flexion strength normal bilaterally XII-midline tongue extension with normal strength.  Motor: Mild weakness proximally and moderately severe weakness distally of left upper extremity with increased muscle tone; mild weakness proximally of left lower extremity with increase in extremity muscle tone; normal strength and tone of right extremities.  Sensory: Reduced perception of tactile sensation over left upper extremity compared to right; normal sensation in lower extremities.  Gait was observed to be wide-based and unsteady.  Was able to stand with minial assistance   ASSESSMENT/PLAN Shaun Pugh is a 65 y.o. male with history of TIAs, previous stroke, coronary artery disease with previous Mi, cardiac stent, diabetes mellitus, atrophy of the right kidney, acute pancreatitis, hyperlipidemia, obstructive sleep apnea, hypertension, and seizures, presenting with visual deficits. He did not receive IV t-PA due to late presentation.  Stroke:  Dominant infarct probably embolic from an unknown source.  Resultant  Right homonymous hemianopia; this is superimposed on chronic visualmimpairment  MRI -  Acute left occipital lobe PCA infarct.  MRA - Suspected right P2 occlusion or high-grade stenosis. Suspected interval distal right vertebral artery occlusion.  Carotid Doppler -  1-39% ICA plaquing.  Vertebral artery flow is antegrade.   2D Echo - pending  LDL 113  HgbA1c - 6.2  VTE prophylaxis - Lovenox Diet Heart Room service appropriate? Yes; Fluid consistency: Thin  No antithrombotic prior to admission, now on aspirin 325 mg daily  Patient counseled to be compliant with his antithrombotic medications  Ongoing aggressive stroke risk factor management  Therapy recommendations:  pending  Disposition:  Pending  Hypertension  Occasionally somewhat low blood pressures  Permissive hypertension (OK if < 220/120) but gradually normalize in 5-7 days  Long-term BP goal normotensive  Hyperlipidemia  Home meds:  No lipid lowering medications prior to admission  LDL 113, goal < 70  Now on Lipitor 40 mg daily  Continue statin at discharge  Diabetes  HgbA1c pending, goal < 7.0  Controlled  Other Stroke Risk Factors  Advanced age  Former smoker - quit 3 years ago  Obesity, Body mass index is 35.24 kg/m., recommend weight loss, diet and exercise as appropriate   Hx stroke/TIA  Coronary artery disease  Obstructive sleep apnea  Other Active Problems  BUN - 21 ; creatinine - 1.5  Hospital day # 2  ATTENDING NOTE: Patient was seen and examined by me personally. Documentation reflects findings. The laboratory and radiographic studies reviewed by me. ROS completed by me personally and pertinent positives fully documented   Condition: stable  Assessment and plan completed by me personally and fully documented above. Plans/Recommendations include:     ECHO pending at time of exam  Statin therapy as above  Rehab eval and treat  SIGNED BY: Dr. Sula Sodahere Teyla Skidgel    To contact  Stroke Continuity provider, please refer to [email or web address] After hours, contact General Neurology

## 2016-03-15 NOTE — Evaluation (Addendum)
Physical Therapy Evaluation Patient Details Name: Shaun Pugh MRN: [id number] DOB: [birth year] Today's Date: 03/15/2016   History of Present Illness  Patient is a 65 yo male admitted 03/13/16 with sudden loss of vision in Rt eye.  MRI report states Lt occipital PCA infarct.    PMH:  CVA with Lt hemiparesis and vision loss on left side., HTN, HLD, CM, CAD, MI, stent, OSA, seizures    Clinical Impression  Patient presents with problems listed below.  Patient with mobility and gait at baseline level - able to ambulate 200' with hand hold assist (mimicing cane) with good balance.  Patient has 24 hour assist at home.  See OT note to address vision.  No PT needs at this time - PT will sign off.    Follow Up Recommendations No PT follow up;Supervision for mobility/OOB    Equipment Recommendations  None recommended by PT    Recommendations for Other Services       Precautions / Restrictions Precautions Precautions: Fall Precaution Comments: Decreased vision Restrictions Weight Bearing Restrictions: No      Mobility  Bed Mobility Overal bed mobility: Needs Assistance Bed Mobility: Rolling;Sidelying to Sit;Sit to Supine Rolling: Modified independent (Device/Increase time) Sidelying to sit: Min assist   Sit to supine: Min assist   General bed mobility comments: Assist to bring trunk to sitting from Lt sidelying.  Unable to use LUE to push trunk up.  Good sitting balance.   Assist to bring LE's onto bed to return to supine.  Transfers Overall transfer level: Needs assistance Equipment used: 1 person hand held assist Transfers: Sit to/from Stand Sit to Stand: Min assist         General transfer comment: Min assist to steady during transfer.  Ambulation/Gait Ambulation/Gait assistance: Min assist Ambulation Distance (Feet): 200 Feet Assistive device: 1 person hand held assist Gait Pattern/deviations: Step-to pattern;Decreased stance time - left;Decreased step length -  left;Decreased step length - right;Decreased stride length Gait velocity: decreased Gait velocity interpretation: Below normal speed for age/gender General Gait Details: Patient with short step length with LLE.  DF present - clears toes during swing phase.  No loss of balance during gait.  Per patient and wife, gait at baseline.  Stairs            Wheelchair Mobility    Modified Rankin (Stroke Patients Only) Modified Rankin (Stroke Patients Only) Pre-Morbid Rankin Score: Moderately severe disability Modified Rankin: Moderately severe disability     Balance Overall balance assessment: Needs assistance         Standing balance support: Single extremity supported Standing balance-Leahy Scale: Poor Standing balance comment: UE support for dynamic activities                             Pertinent Vitals/Pain Pain Assessment: No/denies pain    Home Living Family/patient expects to be discharged to:: Private residence Living Arrangements: Spouse/significant other Available Help at Discharge: Family;Available 24 hours/day Type of Home: House Home Access: Stairs to enter Entrance Stairs-Rails: Right Entrance Stairs-Number of Steps: 3 Home Layout: One level Home Equipment: Cane - single point;Grab bars - tub/shower;Adaptive equipment      Prior Function Level of Independence: Independent with assistive device(s);Needs assistance   Gait / Transfers Assistance Needed: Uses cane for ambulation.  Assist due to vision loss.  ADL's / Homemaking Assistance Needed: Wife assists with bathing, dressing, meal prep.        Hand Dominance  Extremity/Trunk Assessment   Upper Extremity Assessment: Defer to OT evaluation           Lower Extremity Assessment: LLE deficits/detail   LLE Deficits / Details: Noted increased tone with movement.  Decreased strength - especially DF at 3-/5.  Hip and knee movement approx 4-/5.     Communication    Communication: No difficulties  Cognition Arousal/Alertness: Awake/alert Behavior During Therapy: WFL for tasks assessed/performed Overall Cognitive Status: Within Functional Limits for tasks assessed (For basic tasks; Decreased safety awareness)                      General Comments General comments (skin integrity, edema, etc.): Poor vision - see OT note    Exercises     Assessment/Plan    PT Assessment Patent does not need any further PT services  PT Problem List            PT Treatment Interventions      PT Goals (Current goals can be found in the Care Plan section)  Acute Rehab PT Goals PT Goal Formulation: All assessment and education complete, DC therapy    Frequency     Barriers to discharge        Co-evaluation PT/OT/SLP Co-Evaluation/Treatment: Yes Reason for Co-Treatment: Complexity of the patient's impairments (multi-system involvement) PT goals addressed during session: Mobility/safety with mobility         End of Session Equipment Utilized During Treatment: Gait belt Activity Tolerance: Patient tolerated treatment well Patient left: in bed;with call bell/phone within reach;with bed alarm set;with family/visitor present Nurse Communication: Mobility status         Time: [id number] PT Time Calculation (min) (ACUTE ONLY): 25 min   Charges:   PT Evaluation $PT Eval Moderate Complexity: 1 Procedure     PT G CodesVena Austria:        Krina Mraz H 03/15/2016, 5:00 PM Durenda HurtSusan H. Renaldo Fiddleravis, PT, [facility] Acute Rehab Services Pager [phone]

## 2016-03-15 NOTE — Evaluation (Signed)
Occupational Therapy Evaluation Patient Details Name: Shaun Pugh MRN: [id number] DOB: 1951-01-29 Today's Date: 03/15/2016    History of Present Illness Patient is a 65 yo male admitted 03/13/16 with sudden loss of vision in Rt eye.  MRI report states Lt occipital PCA infarct.    PMH:  CVA with Lt hemiparesis and vision loss on left side., HTN, HLD, CM, CAD, MI, stent, OSA, seizures   Clinical Impression   Pt reports he required assist from wife with ADL PTA. Currently pt requires min assist for functional mobility and mod-max assist for ADL. Pt presenting with impaired balance, decreased strength/ROM/coordination in LUE, visual deficits, and decreased awareness of safety impacting his independence and safety with ADL and functional mobility. Pt planning to d/c home with 24/7 assist from his wife. Recommending outpatient OT for follow up for vision. Pt would benefit from continued skilled OT to address established goals.    Follow Up Recommendations  Outpatient OT;Supervision/Assistance - 24 hour (for vision)    Equipment Recommendations  None recommended by OT    Recommendations for Other Services       Precautions / Restrictions Precautions Precautions: Fall Precaution Comments: Decreased vision Restrictions Weight Bearing Restrictions: No      Mobility Bed Mobility Overal bed mobility: Needs Assistance Bed Mobility: Rolling;Sidelying to Sit;Sit to Supine Rolling: Modified independent (Device/Increase time) Sidelying to sit: Min assist   Sit to supine: Min assist   General bed mobility comments: Assist to bring trunk to sitting from Lt sidelying.  Unable to use LUE to push trunk up.  Good sitting balance.   Assist to bring LE's onto bed to return to supine.  Transfers Overall transfer level: Needs assistance Equipment used: 1 person hand held assist Transfers: Sit to/from Stand Sit to Stand: Min assist         General transfer comment: Min assist to steady during  transfer.    Balance Overall balance assessment: Needs assistance Sitting-balance support: Feet supported;No upper extremity supported Sitting balance-Leahy Scale: Good     Standing balance support: Single extremity supported Standing balance-Leahy Scale: Poor Standing balance comment: UE support for dynamic activities                            ADL Overall ADL's : Needs assistance/impaired     Grooming: Min guard;Standing   Upper Body Bathing: Moderate assistance;Sitting   Lower Body Bathing: Maximal assistance;Sit to/from stand   Upper Body Dressing : Moderate assistance;Sitting   Lower Body Dressing: Maximal assistance;Sit to/from stand   Toilet Transfer: Minimal assistance;Ambulation;Comfort height toilet (hand held assist)   Toileting- Clothing Manipulation and Hygiene: Min guard;Sit to/from stand       Functional mobility during ADLs: Minimal assistance (hand held assist) General ADL Comments: Educated pt and family on home safety and follow up with outpatient OT for vision.     Vision Vision Assessment?: Yes Eye Alignment: Within Functional Limits Ocular Range of Motion: Within Functional Limits Visual Fields: Left homonymous hemianopsia;Right inferior homonymous quadranopsia Additional Comments: Pt reports he cannot see in L visual field and has blurred vision in R superior quad when viewing items in near vision. Pt able to identify objects in all visual fields standing in the hallway.   Perception     Praxis      Pertinent Vitals/Pain Pain Assessment: No/denies pain     Hand Dominance Right   Extremity/Trunk Assessment Upper Extremity Assessment Upper Extremity Assessment: LUE deficits/detail LUE  Deficits / Details: Pt noted to have minimal shoulder AROM, does have elbow and hand AROM but limited. Strength grossly 3+/5. LUE Coordination: decreased fine motor;decreased gross motor   Lower Extremity Assessment Lower Extremity Assessment:  Defer to PT evaluation    Cervical / Trunk Assessment Cervical / Trunk Assessment: Kyphotic   Communication Communication Communication: No difficulties   Cognition Arousal/Alertness: Awake/alert Behavior During Therapy: WFL for tasks assessed/performed Overall Cognitive Status: Within Functional Limits for tasks assessed (decreased safety awareness during basic tasks)                     General Comments       Exercises       Shoulder Instructions      Home Living Family/patient expects to be discharged to:: Private residence Living Arrangements: Spouse/significant other Available Help at Discharge: Family;Available 24 hours/day Type of Home: House Home Access: Stairs to enter Enter[employer] CorporationEntrance Stairs-Number of Steps: 3 Entrance Stairs-Rails: Right Home Layout: One level     Bathroom Shower/Tub: Producer, television/film/videoWalk-in shower   Bathroom Toilet: Standard     Home Equipment: Cane - single point;Grab bars - tub/shower;Adaptive equipment Adaptive Equipment: Sock aid        Prior Functioning/Environment Level of Independence: Independent with assistive device(s);Needs assistance  Gait / Transfers Assistance Needed: Uses cane for ambulation.  Assist due to vision loss. ADL's / Homemaking Assistance Needed: Wife assists with bathing, dressing, meal prep.            OT Problem List: Decreased activity tolerance;Impaired balance (sitting and/or standing);Impaired vision/perception;Decreased strength;Decreased range of motion;Decreased coordination;Decreased knowledge of use of DME or AE;Decreased knowledge of precautions;Obesity;Impaired UE functional use   OT Treatment/Interventions: Self-care/ADL training;Therapeutic exercise;Neuromuscular education;Energy conservation;DME and/or AE instruction;Therapeutic activities;Visual/perceptual remediation/compensation;Patient/family education    OT Goals(Current goals can be found in the care plan section) Acute Rehab OT Goals Patient  Stated Goal: return home OT Goal Formulation: With patient/family Time For Goal Achievement: 03/29/16 Potential to Achieve Goals: Good ADL Goals Pt Will Transfer to Toilet: ambulating;regular height toilet;with supervision Pt Will Perform Toileting - Clothing Manipulation and hygiene: with supervision;sit to/from stand Pt Will Perform Tub/Shower Transfer: Shower transfer;with supervision;ambulating Pt/caregiver will Perform Home Exercise Program: Increased ROM;Increased strength;Left upper extremity;Independently;With written HEP provided Additional ADL Goal #1: Pt will independently identify 5 objects in R visual field during ADL.  OT Frequency: Min 2X/week   Barriers to D/C:            Co-evaluation PT/OT/SLP Co-Evaluation/Treatment: Yes Reason for Co-Treatment: Complexity of the patient's impairments (multi-system involvement) PT goals addressed during session: Mobility/safety with mobility OT goals addressed during session: ADL's and self-care      End of Session Equipment Utilized During Treatment: Gait belt Nurse Communication: Mobility status  Activity Tolerance: Patient tolerated treatment well Patient left: in bed;with call bell/phone within reach;with bed alarm set;with family/visitor present   Time: [id number] OT Time Calculation (min): 27 min Charges:  OT General Charges $OT Visit: 1 Procedure OT Evaluation $OT Eval Moderate Complexity: 1 Procedure G-Codes:     Gaye AlkenBailey A Aryan Bello M.S., Shaun Pugh Pager: [phone]  03/15/2016, 5:07 PM

## 2016-03-16 ENCOUNTER — Inpatient Hospital Stay (HOSPITAL_COMMUNITY): Payer: Medicare Other

## 2016-03-16 ENCOUNTER — Encounter (HOSPITAL_COMMUNITY): Payer: Self-pay | Admitting: Radiology

## 2016-03-16 DIAGNOSIS — I639 Cerebral infarction, unspecified: Secondary | ICD-10-CM

## 2016-03-16 DIAGNOSIS — I63432 Cerebral infarction due to embolism of left posterior cerebral artery: Principal | ICD-10-CM

## 2016-03-16 DIAGNOSIS — G459 Transient cerebral ischemic attack, unspecified: Secondary | ICD-10-CM

## 2016-03-16 DIAGNOSIS — N3000 Acute cystitis without hematuria: Secondary | ICD-10-CM

## 2016-03-16 LAB — URINE MICROSCOPIC-ADD ON

## 2016-03-16 LAB — URINALYSIS, ROUTINE W REFLEX MICROSCOPIC
Bilirubin Urine: NEGATIVE
Glucose, UA: NEGATIVE mg/dL
Ketones, ur: NEGATIVE mg/dL
Nitrite: POSITIVE — AB
Protein, ur: 30 mg/dL — AB
Specific Gravity, Urine: 1.031 — ABNORMAL HIGH (ref 1.005–1.030)
pH: 6.5 (ref 5.0–8.0)

## 2016-03-16 LAB — ECHOCARDIOGRAM COMPLETE
Height: 73 in
Weight: 4273.6 oz

## 2016-03-16 LAB — GLUCOSE, CAPILLARY
GLUCOSE-CAPILLARY: 167 mg/dL — AB (ref 65–99)
GLUCOSE-CAPILLARY: 144 mg/dL — AB (ref 65–99)
Glucose-Capillary: 136 mg/dL — ABNORMAL HIGH (ref 65–99)
Glucose-Capillary: 147 mg/dL — ABNORMAL HIGH (ref 65–99)

## 2016-03-16 MED ORDER — IOPAMIDOL (ISOVUE-370) INJECTION 76%
50.0000 mL | Freq: Once | INTRAVENOUS | Status: AC | PRN
  Administered 2016-03-16: 50 mL via INTRAVENOUS

## 2016-03-16 MED ORDER — DEXTROSE 5 % IV SOLN
1.0000 g | INTRAVENOUS | Status: DC
  Administered 2016-03-16: 1 g via INTRAVENOUS
  Filled 2016-03-16 (×2): qty 10

## 2016-03-16 MED ORDER — PERFLUTREN LIPID MICROSPHERE
INTRAVENOUS | Status: AC
  Filled 2016-03-16: qty 10

## 2016-03-16 MED ORDER — PERFLUTREN LIPID MICROSPHERE
1.0000 mL | INTRAVENOUS | Status: AC | PRN
  Administered 2016-03-16: 2 mL via INTRAVENOUS
  Filled 2016-03-16: qty 10

## 2016-03-16 NOTE — Progress Notes (Signed)
PROGRESS NOTE        PATIENT DETAILS Name: Shaun Pugh Age: 65 y.o. Sex: male Date of Birth: 03/16/1951 Admit Date: 03/13/2016 Admitting Physician [name], MD KGM:WNUU [name], MD  Brief Narrative: Patient is a 65 y.o. male with prior history of CVA with resultant left eye blindness and hemiparesis (LUE>LLE) presented with sudden onset of visual loss from her right eye. MRI of the brain confirms left occipital PCA infarct.  Subjective: Continue to have improvement in his right visual filed- able to see things more clearly today. Developed dysuria and frequency.  Assessment/Plan: Acute CVA: Presenting as right-sided visual loss, MRI of the brain confirms acute left occipital PCA infarct, it also shows a large chronic right cerebral hemispheric infarct. Patient already had very poor vision in his left eye, and unfortunately developed right eye visual loss-pain-fortunately this continues to improve. Has chronic left-sided hemiparesis (LUE>LLE) that is essentially unchanged. Carotid Doppler does not show any significant stenosis-but CTA neck/head shows 80-90% stenosis in the intracranial portion of right intracranial carotid artery-also shows thrombus in the left subclavian artery. [name] recommending coumadin-spoke with patient and spouse they will d/w stroke MD before starting coumadin.A1C is 6.2, Echo did not show any embolic foci-EF was preserved.  LDL elevated at 113- per spouse-he has been intolerant to several statins in the past and are requesting not to be restarted on any statins at this time.   UTI: send Urine cx-start Rocephin-has symptoms and a positive UA-at risk for UTI-due to chronic neurogenic bladder and urinary retention issues.   Hypertension: Allow permissive hypertension in the setting of acute CVA, resume antihypertensives over the next few days.  Type 2 diabetes: Continue to hold oral hypoglycemics, CBGs stable with SSI.  Chronic kidney  disease stage III: Creatinine close to usual baseline, follow  Leukocytosis: No indication of infection at this time, follow  Chronic urinary retention: Per spouse-the patient has had issues with urinary retention for a while-and has had urology evaluation-and likely has a neurogenic bladder. He is now able to void at the bedside commode-continue to follow serial bladder ultrasounds. Hopefully we can avoid placing a Foley catheter at this time.  DVT Prophylaxis: Prophylactic Lovenox   Code Status: Full code   Family Communication: Spouse at bedside  Disposition Plan: Remain inpatient-home likely 11/7  Antimicrobial agents: None  Procedures: Carotid/Dopplers 11/4>>-39% ICA plaquing.  Vertebral artery flow is antegrade Echo 11/6>>EF 50-55%  CONSULTS:  neurology  Time spent: 25- minutes-Greater than 50% of this time was spent in counseling, explanation of diagnosis, planning of further management, and coordination of care.  MEDICATIONS: Anti-infectives    Start     Dose/Rate Route Frequency Ordered Stop   03/16/16 1330  cefTRIAXone (ROCEPHIN) 1 g in dextrose 5 % 50 mL IVPB     1 g 100 mL/hr over 30 Minutes Intravenous Every 24 hours 03/16/16 1230        Scheduled Meds: . aspirin  300 mg Rectal Daily   Or  . aspirin  325 mg Oral Daily  . atorvastatin  20 mg Oral q1800  . cefTRIAXone (ROCEPHIN)  IV  1 g Intravenous Q24H  . enoxaparin (LOVENOX) injection  40 mg Subcutaneous Q24H  . escitalopram  5 mg Oral Daily  . insulin aspart  0-15 Units Subcutaneous TID WC  . pantoprazole  40 mg Oral Daily  Continuous Infusions:  PRN Meds:.acetaminophen **OR** acetaminophen, senna-docusate   PHYSICAL EXAM: Vital signs: Vitals:   03/15/16 1502 03/15/16 2116 03/16/16 0205 03/16/16 0619  BP: 119/68 133/78 119/62 108/83  Pulse: 88 93 97 97  Resp: 18 18 18 18   Temp: 98.4 F (36.9 C) 98.2 F (36.8 C)  97.9 F (36.6 C)  TempSrc: Oral Oral Oral Oral  SpO2: 96% 97% 95% 93%    Weight:      Height:       Filed Weights   03/13/16 1915 03/14/16 0105  Weight: 124.7 kg (275 lb) 121.2 kg (267 lb 1.6 oz)   Body mass index is 35.24 kg/m.   General appearance :Awake, alert, not in any distress. Speech Clear. Chronically sick appearing. Eyes:Now able to make out outlines of figures HEENT: Atraumatic and Normocephalic Neck: supple, no JVD. No cervical lymphadenopathy. No thyromegaly Resp:Good air entry bilaterally, no added sounds  CVS: S1 S2 regular, no murmurs.  GI: Bowel sounds present, Non tender and not distended with no gaurding, rigidity or rebound.No organomegaly Extremities: B/L Lower Ext shows no edema, both legs are warm to touch Neurology:  Left upper extremity, around 3+/5, left lower extremity 4/5-right upper/right lower around 5/5 Psychiatric: Alert and oriented x 3. Normal mood. Musculoskeletal:No digital cyanosis Skin:No Rash, warm and dry Wounds:N/A  I have personally reviewed following labs and imaging studies  LABORATORY DATA: CBC:  Recent Labs Lab 03/13/16 1931 03/13/16 1947 03/15/16 0518  WBC 15.8*  --  11.7*  NEUTROABS 9.8*  --  7.8*  HGB 15.5 15.3 13.8  HCT 43.8 45.0 40.2  MCV 86.2  --  85.7  PLT 287  --  199    Basic Metabolic Panel:  Recent Labs Lab 03/13/16 1931 03/13/16 1947 03/15/16 0518  NA 133* 139 136  K 3.9 4.0 4.1  CL 99* 99* 103  CO2 26  --  24  GLUCOSE 119* 119* 143*  BUN 21* 21* 18  CREATININE 1.43* 1.50* 1.46*  CALCIUM 9.4  --  9.3    GFR: Estimated Creatinine Clearance: 67.9 mL/min (by C-G formula based on SCr of 1.46 mg/dL (H)).  Liver Function Tests:  Recent Labs Lab 03/13/16 1931  AST 23  ALT 33  ALKPHOS 81  BILITOT 0.5  PROT 8.4*  ALBUMIN 4.2   No results for input(s): LIPASE, AMYLASE in the last 168 hours. No results for input(s): AMMONIA in the last 168 hours.  Coagulation Profile:  Recent Labs Lab 03/13/16 1931  INR 0.92    Cardiac Enzymes:  Recent Labs Lab  03/13/16 1931  TROPONINI <0.03    BNP (last 3 results) No results for input(s): PROBNP in the last 8760 hours.  HbA1C:  Recent Labs  03/14/16 0106  HGBA1C 6.2*    CBG:  Recent Labs Lab 03/15/16 1229 03/15/16 1705 03/15/16 2116 03/16/16 0802 03/16/16 1154  GLUCAP 114* 139* 198* 136* 167*    Lipid Profile:  Recent Labs  03/14/16 0106  CHOL 189  HDL 25*  LDLCALC 113*  TRIG 255*  CHOLHDL 7.6    Thyroid Function Tests: No results for input(s): TSH, T4TOTAL, FREET4, T3FREE, THYROIDAB in the last 72 hours.  Anemia Panel: No results for input(s): VITAMINB12, FOLATE, FERRITIN, TIBC, IRON, RETICCTPCT in the last 72 hours.  Urine analysis:    Component Value Date/Time   COLORURINE YELLOW 03/16/2016 0624   APPEARANCEUR CLOUDY (A) 03/16/2016 0624   LABSPEC 1.031 (H) 03/16/2016 0624   PHURINE 6.5 03/16/2016 [id number]   GLUCOSEU  NEGATIVE 03/16/2016 0624   HGBUR LARGE (A) 03/16/2016 0624   BILIRUBINUR NEGATIVE 03/16/2016 0624   KETONESUR NEGATIVE 03/16/2016 0624   PROTEINUR 30 (A) 03/16/2016 0624   UROBILINOGEN 0.2 05/20/2013 2220   NITRITE POSITIVE (A) 03/16/2016 0624   LEUKOCYTESUR LARGE (A) 03/16/2016 0624    Sepsis Labs: Lactic Acid, Venous No results found for: LATICACIDVEN  MICROBIOLOGY: No results found for this or any previous visit (from the past 240 hour(s)).  RADIOLOGY STUDIES/RESULTS: Ct Angio Head W Or Wo Contrast  Result Date: 03/16/2016 CLINICAL DATA:  Stroke.  Left PCA infarct. EXAM: CT ANGIOGRAPHY HEAD AND NECK TECHNIQUE: Multidetector CT imaging of the head and neck was performed using the standard protocol during bolus administration of intravenous contrast. Multiplanar CT image reconstructions and MIPs were obtained to evaluate the vascular anatomy. Carotid stenosis measurements (when applicable) are obtained utilizing NASCET criteria, using the distal internal carotid diameter as the denominator. CONTRAST:  50 mL IV Isovue 370 COMPARISON:   Brain MRI 03/14/2016, CTA 03/17/2012 FINDINGS: CT HEAD FINDINGS Brain: There is encephalomalacia and within the right frontal, parietal and occipital lobes, compatible with remote infarcts. There is hypoattenuation in the left PCA distribution that is consistent with the known acute left PCA infarct. There is no midline shift or significant mass effect. Prominence of the ventricles, greater than expected for age, persist. There is no intracranial hemorrhage. Vascular: No hyperdense vessel or unexpected calcification. Skull: Normal visualized skull base, calvarium and extracranial soft tissues. Sinuses/Orbits: No sinus fluid levels or advanced mucosal thickening. No mastoid effusion. Normal orbits. Review of the MIP images confirms the above findings CTA NECK FINDINGS Aortic arch: There is aortic atherosclerosis. There is E centric thrombus within the anterior aspect of the left subclavian artery (series 502, image 48). The right subclavian artery is normal. There is a linear filling defect within the distal aspect of the ascending thoracic aorta (image 23). Right carotid system: There is E eccentric noncalcified plaque within the proximal right internal carotid artery. This results in severe stenosis, measuring approximately 80-90%. This has worsened compared to the prior study of 03/17/2012. There is a web-like component of the plaque extending superiorly that is also increased from the prior exam. There is no focal stenosis of the remainder of the cervical right ICA. Left carotid system: There is partially calcified atherosclerotic disease of the left carotid bifurcation without hemodynamically significant stenosis. Vertebral arteries: Both vertebral artery origins are widely patent. The vertebral system is left dominant. No focal stenosis, aneurysm or dissection of the vertebral arteries to the level of the basilar. The diminutive right vertebral artery terminates in the posterior inferior cerebellar artery.  Skeleton: Negative Other neck: Negative Upper chest: Unremarkable. Review of the MIP images confirms the above findings CTA HEAD FINDINGS Anterior circulation: --Intracranial internal carotid arteries: Mild bilateral calcification of the proximal intracranial internal carotid arteries without hemodynamically significant stenosis. --Anterior cerebral arteries: Hypoplastic right A1 segment is a congenital variant. Otherwise normal. --Middle cerebral arteries: There is mild narrowing of the mid M1 segment of the left MCA. The right MCA is normal. --Posterior communicating arteries: Small P-comm is present on the right. Not clearly seen on the left. Posterior circulation: --Posterior cerebral arteries: Normal. --Superior cerebellar arteries: Normal. --Basilar artery: Normal. --Anterior inferior cerebellar arteries: Not visualized, which is not uncommon. --Posterior inferior cerebellar arteries: The right vertebral artery terminates in the posterior inferior cerebellar artery. Normal left PICA. Venous sinuses: As permitted by contrast timing, patent. Anatomic variants: Normal congenital variant hypoplastic right  A1 segment. Delayed phase: No abnormal intracranial enhancement. Review of the MIP images confirms the above findings IMPRESSION: 1. Re- demonstration of findings of acute left PCA infarct. Chronic extensive right hemispheric encephalomalacia. 2. No intracranial arterial occlusion. 3. Severe stenosis of the proximal right intracranial carotid artery, measuring 80 and 90%, worsened from the prior study. This is secondary to large volume non calcified, eccentric atherosclerotic plaque. 4. Large amount of plaque vs chronic thrombus within the proximal left subclavian artery, slightly worsened compared to 03/17/2012. 5. Linear filling defect within the aortic arch is decreased compared to 03/17/2012. Electronically Signed   By: Deatra RobinsonKevin  Herman M.D.   On: 03/16/2016 04:32   Ct Head Wo Contrast  Result Date:  03/13/2016 CLINICAL DATA:  Headache EXAM: CT HEAD WITHOUT CONTRAST TECHNIQUE: Contiguous axial images were obtained from the base of the skull through the vertex without intravenous contrast. COMPARISON:  03/20/2012, 03/17/2012 FINDINGS: Brain: There is no acute hemorrhage identified. There is no focal mass, mass effect or midline shift. Evidence of large old right-sided infarct involving the occipital, temporal, frontal and parietal lobes. Mild associated distortion of right lateral ventricle. There is hypodensity within the left paracentral occipital lobe, new compared with 2013 study. No significant surrounding mass effect. Periventricular white matter hypodensities consistent with small vessel disease. Old appearing lacunar infarcts within the bilateral the thalamus. Old small infarct in the right cerebellar hemisphere. Vascular: No hyperdense vessels. Mild calcifications within the carotid arteries at the skullbase. Skull: No fracture. Minimal opacifications of the inferior mastoids. Sinuses/Orbits: Mild mucosal thickening in paranasal sinuses. No acute orbital abnormality. Other: None IMPRESSION: 1. No acute hemorrhage or focal mass lesion visualized. 2. Evidence of old large right-sided hemispheric infarct. 3. Focal hypodense area within the left parasagittal occipital lobe, new since 2013, likely old given absence of significant surrounding mass effect, however MRI could be obtained to confirm that this does not represent subacute ischemic change. 4. Old lacunar infarcts within the thalamus. Old right cerebellar infarct. Electronically Signed   By: Jasmine PangKim  Fujinaga M.D.   On: 03/13/2016 20:23   Ct Angio Neck W Or Wo Contrast  Result Date: 03/16/2016 CLINICAL DATA:  Stroke.  Left PCA infarct. EXAM: CT ANGIOGRAPHY HEAD AND NECK TECHNIQUE: Multidetector CT imaging of the head and neck was performed using the standard protocol during bolus administration of intravenous contrast. Multiplanar CT image  reconstructions and MIPs were obtained to evaluate the vascular anatomy. Carotid stenosis measurements (when applicable) are obtained utilizing NASCET criteria, using the distal internal carotid diameter as the denominator. CONTRAST:  50 mL IV Isovue 370 COMPARISON:  Brain MRI 03/14/2016, CTA 03/17/2012 FINDINGS: CT HEAD FINDINGS Brain: There is encephalomalacia and within the right frontal, parietal and occipital lobes, compatible with remote infarcts. There is hypoattenuation in the left PCA distribution that is consistent with the known acute left PCA infarct. There is no midline shift or significant mass effect. Prominence of the ventricles, greater than expected for age, persist. There is no intracranial hemorrhage. Vascular: No hyperdense vessel or unexpected calcification. Skull: Normal visualized skull base, calvarium and extracranial soft tissues. Sinuses/Orbits: No sinus fluid levels or advanced mucosal thickening. No mastoid effusion. Normal orbits. Review of the MIP images confirms the above findings CTA NECK FINDINGS Aortic arch: There is aortic atherosclerosis. There is E centric thrombus within the anterior aspect of the left subclavian artery (series 502, image 48). The right subclavian artery is normal. There is a linear filling defect within the distal aspect of the ascending thoracic  aorta (image 23). Right carotid system: There is E eccentric noncalcified plaque within the proximal right internal carotid artery. This results in severe stenosis, measuring approximately 80-90%. This has worsened compared to the prior study of 03/17/2012. There is a web-like component of the plaque extending superiorly that is also increased from the prior exam. There is no focal stenosis of the remainder of the cervical right ICA. Left carotid system: There is partially calcified atherosclerotic disease of the left carotid bifurcation without hemodynamically significant stenosis. Vertebral arteries: Both vertebral  artery origins are widely patent. The vertebral system is left dominant. No focal stenosis, aneurysm or dissection of the vertebral arteries to the level of the basilar. The diminutive right vertebral artery terminates in the posterior inferior cerebellar artery. Skeleton: Negative Other neck: Negative Upper chest: Unremarkable. Review of the MIP images confirms the above findings CTA HEAD FINDINGS Anterior circulation: --Intracranial internal carotid arteries: Mild bilateral calcification of the proximal intracranial internal carotid arteries without hemodynamically significant stenosis. --Anterior cerebral arteries: Hypoplastic right A1 segment is a congenital variant. Otherwise normal. --Middle cerebral arteries: There is mild narrowing of the mid M1 segment of the left MCA. The right MCA is normal. --Posterior communicating arteries: Small P-comm is present on the right. Not clearly seen on the left. Posterior circulation: --Posterior cerebral arteries: Normal. --Superior cerebellar arteries: Normal. --Basilar artery: Normal. --Anterior inferior cerebellar arteries: Not visualized, which is not uncommon. --Posterior inferior cerebellar arteries: The right vertebral artery terminates in the posterior inferior cerebellar artery. Normal left PICA. Venous sinuses: As permitted by contrast timing, patent. Anatomic variants: Normal congenital variant hypoplastic right A1 segment. Delayed phase: No abnormal intracranial enhancement. Review of the MIP images confirms the above findings IMPRESSION: 1. Re- demonstration of findings of acute left PCA infarct. Chronic extensive right hemispheric encephalomalacia. 2. No intracranial arterial occlusion. 3. Severe stenosis of the proximal right intracranial carotid artery, measuring 80 and 90%, worsened from the prior study. This is secondary to large volume non calcified, eccentric atherosclerotic plaque. 4. Large amount of plaque vs chronic thrombus within the proximal left  subclavian artery, slightly worsened compared to 03/17/2012. 5. Linear filling defect within the aortic arch is decreased compared to 03/17/2012. Electronically Signed   By: Deatra Robinson M.D.   On: 03/16/2016 04:32   Mr Brain Wo Contrast  Result Date: 03/14/2016 CLINICAL DATA:  Acute onset visual loss.  Prior stroke. EXAM: MRI HEAD WITHOUT CONTRAST MRA HEAD WITHOUT CONTRAST TECHNIQUE: Multiplanar, multiecho pulse sequences of the brain and surrounding structures were obtained without intravenous contrast. Angiographic images of the head were obtained using MRA technique without contrast. COMPARISON:  Head CT 03/13/2016 and CTA 03/17/2012 FINDINGS: MRI HEAD FINDINGS The examination had to be discontinued prior to completion due to patient claustrophobia. Axial and coronal diffusion, sagittal T1, and axial T2* gradient echo sequences were obtained. There is a 5 cm acute infarct in the left PCA territory involving the medial occipital lobe. A focus of microhemorrhage is noted near the posterior superior aspect of the infarct. A large chronic right cerebral hemispheric infarct is again seen with associated chronic blood products and ex vacuo enlargement of the right lateral ventricle. There are also small chronic infarcts in the thalami and cerebellum bilaterally. There is moderate cerebral atrophy. No significant mass effect or evidence of extra-axial fluid collection on this limited study. MRA HEAD FINDINGS The study is moderately to severely motion degraded. The visualized distal left vertebral artery is patent. No flow related enhancement is seen in the  distal right vertebral artery, which was patent but hypoplastic on the prior CTA. The basilar artery is patent without evidence of significant stenosis. The P1 segments and proximal left P2 are grossly patent. Signal dropout at the level of the proximal right P2 segment may reflect a high-grade stenosis or segmental occlusion, with evaluation severely limited by  motion artifact through this level. The internal carotid arteries are patent from skullbase to carotid termini. Cavernous segment scratch sec cavernous and supraclinoid segment evaluation is limited by motion artifact. The M1 segments and left A1 segment are grossly patent. The right A1 segment was patent on the prior CTA but is not identified on this examination. Anterior circulation branch vessel evaluation is severely limited by motion artifact, although there is a decreased number of right MCA branch vessels corresponding to the large chronic infarct. IMPRESSION: 1. Incomplete MRI.  Acute left occipital lobe PCA infarct. 2. Large chronic right cerebral hemispheric infarct. 3. Motion degraded head MRA. P1 and proximal left P2 segments are grossly patent. Suspected right P2 occlusion or high-grade stenosis. 4. Suspected interval distal right vertebral artery occlusion. Electronically Signed   By: Sebastian Ache M.D.   On: 03/14/2016 13:47   Mr Maxine Glenn Head/brain ZO Cm  Result Date: 03/14/2016 CLINICAL DATA:  Acute onset visual loss.  Prior stroke. EXAM: MRI HEAD WITHOUT CONTRAST MRA HEAD WITHOUT CONTRAST TECHNIQUE: Multiplanar, multiecho pulse sequences of the brain and surrounding structures were obtained without intravenous contrast. Angiographic images of the head were obtained using MRA technique without contrast. COMPARISON:  Head CT 03/13/2016 and CTA 03/17/2012 FINDINGS: MRI HEAD FINDINGS The examination had to be discontinued prior to completion due to patient claustrophobia. Axial and coronal diffusion, sagittal T1, and axial T2* gradient echo sequences were obtained. There is a 5 cm acute infarct in the left PCA territory involving the medial occipital lobe. A focus of microhemorrhage is noted near the posterior superior aspect of the infarct. A large chronic right cerebral hemispheric infarct is again seen with associated chronic blood products and ex vacuo enlargement of the right lateral ventricle.  There are also small chronic infarcts in the thalami and cerebellum bilaterally. There is moderate cerebral atrophy. No significant mass effect or evidence of extra-axial fluid collection on this limited study. MRA HEAD FINDINGS The study is moderately to severely motion degraded. The visualized distal left vertebral artery is patent. No flow related enhancement is seen in the distal right vertebral artery, which was patent but hypoplastic on the prior CTA. The basilar artery is patent without evidence of significant stenosis. The P1 segments and proximal left P2 are grossly patent. Signal dropout at the level of the proximal right P2 segment may reflect a high-grade stenosis or segmental occlusion, with evaluation severely limited by motion artifact through this level. The internal carotid arteries are patent from skullbase to carotid termini. Cavernous segment scratch sec cavernous and supraclinoid segment evaluation is limited by motion artifact. The M1 segments and left A1 segment are grossly patent. The right A1 segment was patent on the prior CTA but is not identified on this examination. Anterior circulation branch vessel evaluation is severely limited by motion artifact, although there is a decreased number of right MCA branch vessels corresponding to the large chronic infarct. IMPRESSION: 1. Incomplete MRI.  Acute left occipital lobe PCA infarct. 2. Large chronic right cerebral hemispheric infarct. 3. Motion degraded head MRA. P1 and proximal left P2 segments are grossly patent. Suspected right P2 occlusion or high-grade stenosis. 4. Suspected interval distal  right vertebral artery occlusion. Electronically Signed   By: Sebastian Ache M.D.   On: 03/14/2016 13:47     LOS: 3 days   Jeoffrey Massed, MD  [facility] Pager:[phone]  If 7PM-7AM, please contact night-coverage [email or web address] Password [email or web address] 03/16/2016, 1:01 PM

## 2016-03-16 NOTE — Progress Notes (Signed)
  Echocardiogram 2D Echocardiogram with Definity has been performed.  Nolon RodBrown, Tony 03/16/2016, 10:39 AM

## 2016-03-16 NOTE — Progress Notes (Signed)
Pt has some complaints of urgency, discomfort and foul odor while urinating.  Notified the on call NP K. [facility].  Will continue to monitor and notify as needed.

## 2016-03-16 NOTE — Progress Notes (Signed)
VASCULAR LAB PRELIMINARY  PRELIMINARY  PRELIMINARY  PRELIMINARY  Bilateral lower extremity venous duplex completed.    Preliminary report:  Bilateral:  No evidence of DVT, superficial thrombosis, or Baker's Cyst.   Sher Hellinger, RVS 03/16/2016, 11:16 AM

## 2016-03-16 NOTE — Progress Notes (Signed)
STROKE TEAM PROGRESS NOTE   SUBJECTIVE (INTERVAL HISTORY) His wife was at the bedside. He still has right lower quadrantanopia. Right upper quadrant vision improved. Left visual field at baseline. CTA head and neck done showed improved aortic arch thrombus, but stable or worsening left subclavian A thrombus vs. Soft plaque proximal of VA take off.    OBJECTIVE Temp:  [97.9 F (36.6 C)-98.2 F (36.8 C)] 98 F (36.7 C) (11/06 2127) Pulse Rate:  [85-97] 92 (11/06 2127) Cardiac Rhythm: Normal sinus rhythm;Bundle branch block (11/06 1900) Resp:  [16-18] 18 (11/06 2127) BP: (108-156)/(62-83) 156/75 (11/06 2127) SpO2:  [93 %-97 %] 95 % (11/06 2127)  CBC:   Recent Labs Lab 03/13/16 1931 03/13/16 1947 03/15/16 0518  WBC 15.8*  --  11.7*  NEUTROABS 9.8*  --  7.8*  HGB 15.5 15.3 13.8  HCT 43.8 45.0 40.2  MCV 86.2  --  85.7  PLT 287  --  199    Basic Metabolic Panel:   Recent Labs Lab 03/13/16 1931 03/13/16 1947 03/15/16 0518  NA 133* 139 136  K 3.9 4.0 4.1  CL 99* 99* 103  CO2 26  --  24  GLUCOSE 119* 119* 143*  BUN 21* 21* 18  CREATININE 1.43* 1.50* 1.46*  CALCIUM 9.4  --  9.3    Lipid Panel:     Component Value Date/Time   CHOL 189 03/14/2016 0106   TRIG 255 (H) 03/14/2016 0106   HDL 25 (L) 03/14/2016 0106   CHOLHDL 7.6 03/14/2016 0106   VLDL 51 (H) 03/14/2016 0106   LDLCALC 113 (H) 03/14/2016 0106   HgbA1c:  Lab Results  Component Value Date   HGBA1C 6.2 (H) 03/14/2016   Urine Drug Screen:     Component Value Date/Time   LABOPIA NONE DETECTED 03/14/2016 0909   COCAINSCRNUR NONE DETECTED 03/14/2016 0909   LABBENZ NONE DETECTED 03/14/2016 0909   AMPHETMU NONE DETECTED 03/14/2016 0909   THCU NONE DETECTED 03/14/2016 0909   LABBARB NONE DETECTED 03/14/2016 0909      IMAGING I have personally reviewed the radiological images below and agree with the radiology interpretations.  Ct Head Wo Contrast 03/13/2016 1. No acute hemorrhage or focal mass lesion  visualized.  2. Evidence of old large right-sided hemispheric infarct.  3. Focal hypodense area within the left parasagittal occipital lobe, new since 2013, likely old given absence of significant surrounding mass effect, however MRI could be obtained to confirm that this does not represent subacute ischemic change.  4. Old lacunar infarcts within the thalamus. Old right cerebellar infarct.   MR MRA Head / Brain Wo Contrast (motion degraded) 03/14/2016 1. Incomplete MRI.  Acute left occipital lobe PCA infarct. 2. Large chronic right cerebral hemispheric infarct. 3. Motion degraded head MRA. P1 and proximal left P2 segments are grossly patent. Suspected right P2 occlusion or high-grade stenosis. 4. Suspected interval distal right vertebral artery occlusion.  LE venous doppler - Bilateral:  No evidence of DVT, superficial thrombosis, or Baker's Cyst.  CUS - Bilateral: 1-39% ICA stenosis. Vertebral artery flow is antegrade.  TTE - - Left ventricle: Poorly visualized. The cavity size was normal.   Wall thickness was increased in a pattern of moderate LVH.   Systolic function was normal. The estimated ejection fraction was   in the range of 50% to 55%. Doppler parameters are consistent   with abnormal left ventricular relaxation (grade 1 diastolic   dysfunction).  Ct Angio Head and neck W Or Wo Contrast 03/16/2016  IMPRESSION: 1. Re- demonstration of findings of acute left PCA infarct. Chronic extensive right hemispheric encephalomalacia. 2. No intracranial arterial occlusion. 3. Severe stenosis of the proximal right intracranial carotid artery, measuring 80 and 90%, worsened from the prior study. This is secondary to large volume non calcified, eccentric atherosclerotic plaque. 4. Large amount of plaque vs chronic thrombus within the proximal left subclavian artery, slightly worsened compared to 03/17/2012. 5. Linear filling defect within the aortic arch is decreased compared to 03/17/2012.      Physical exam  Temp:  [97.9 F (36.6 C)-98.2 F (36.8 C)] 98 F (36.7 C) (11/06 2127) Pulse Rate:  [85-97] 92 (11/06 2127) Resp:  [16-18] 18 (11/06 2127) BP: (108-156)/(62-83) 156/75 (11/06 2127) SpO2:  [93 %-97 %] 95 % (11/06 2127)  General - Well nourished, well developed, in no apparent distress.  Ophthalmologic - Fundi not visualized due to eye movement.  Cardiovascular - Regular rate and rhythm.  Mental Status -  Level of arousal and orientation to time, place, and person were intact. Language including expression, naming, repetition, comprehension was assessed and found intact. Fund of Knowledge was assessed and was intact.  Cranial Nerves II - XII - II - right lower quadrantanopia. III, IV, VI - Extraocular movements intact. V - Facial sensation intact bilaterally. VII - Facial movement intact bilaterally. VIII - Hearing & vestibular intact bilaterally. X - Palate elevates symmetrically. XI - Chin turning & shoulder shrug intact bilaterally. XII - Tongue protrusion intact.  Motor Strength - The patient's strength was normal in all extremities except LUE 4/5 proximal and 3/5 distally and pronator drift was present on the left.  Bulk was normal and fasciculations were absent.   Motor Tone - Muscle tone was assessed at the neck and appendages and was normal.  Reflexes - The patient's reflexes were 1+ in all extremities and he had no pathological reflexes.  Sensory - Light touch, temperature/pinprick were assessed and were symmetrical.    Coordination - The patient had normal movements in the hands with no ataxia or dysmetria.  Tremor was absent.  Gait and Station - not tested due to safety concerns.    ASSESSMENT/PLAN Shaun Pugh is a 65 y.o. male with history of TIAs, previous stroke, CAD/MI, cardiac stent, diabetes mellitus, atrophy of the right kidney, hyperlipidemia, obstructive sleep apnea, hypertension, and seizures, presenting with visual deficits.  He did not receive IV t-PA due to late presentation.  Stroke:  Left PCA infarct probably embolic from left subclavian thrombus proximal to VA take off. It is likely that his current left PCA occlusion is due to left subclavian thrombus and his old right MCA infarct was due to aortic arch thrombus close to innominate artery.   Resultant  Right lower quadrantanopia  MRI -  Acute left occipital lobe PCA infarct.  MRA - Suspected right P2 occlusion or high-grade stenosis. Suspected interval distal right vertebral artery occlusion.  Carotid Doppler -  1-39% ICA plaquing.  Vertebral artery flow is antegrade.   2D Echo - EF 50-55%  LDL 113  HgbA1c - 6.2  VTE prophylaxis - Lovenox Diet Heart Room service appropriate? Yes; Fluid consistency: Thin  No antithrombotic prior to admission, now on aspirin 325 mg daily. Recommend coumadin for stroke prevention due to left subclavian A thrombus as well as aortic arch thrombus. ASA bridge is reasonable.  Patient counseled to be compliant with his antithrombotic medications  Ongoing aggressive stroke risk factor management  Therapy recommendations:  pending  Disposition: Pending  Thrombus at aorta and left subclavian A  Aortic arch thrombus smaller than 2013  Subclavian A thrombus slightly bigger than 2013  Recommend anticoagulation with coumadin for stroke prevention  Repeat CTA neck in 3-6 months.   Hypertension  Occasionally somewhat low blood pressures  Permissive hypertension (OK if < 220/120) but gradually normalize in 5-7 days  Long-term BP goal normotensive  Hyperlipidemia  Home meds:  No lipid lowering medications prior to admission  LDL 113, goal < 70  Now on Lipitor 40 mg daily  Continue statin at discharge  Diabetes  HgbA1c 6.2, goal < 7.0  Controlled  SSI  Other Stroke Risk Factors  Advanced age  Former smoker - quit 3 years ago  Obesity, Body mass index is 35.24 kg/m., recommend weight loss, diet and  exercise as appropriate   Hx stroke/TIA  Coronary artery disease  Obstructive sleep apnea  Other Active Problems  BUN - 21 ; creatinine - 1.5  Hospital day # 3  Neurology will sign off. Please call with questions. [facility]. Thanks for the consult.   To contact Stroke Continuity provider, please refer to [email or web address]. After hours, contact General Neurology

## 2016-03-16 NOTE — Progress Notes (Signed)
03/16/16/ 1237  Called lab to see if UA Cx can be added to the UA sent at [id number] 03/16/16. Per lab they will add the test to the urine that was sent this morning.

## 2016-03-16 NOTE — Progress Notes (Signed)
Occupational Therapy Treatment Patient Details Name: Shaun LionRoy Pugh MRN: [id number] DOB: 31-Jan-1951 Today's Date: 03/16/2016    History of present illness Patient is a 65 yo male admitted 03/13/16 with sudden loss of vision in Rt eye.  MRI report states Lt occipital PCA infarct.    PMH:  CVA with Lt hemiparesis and vision loss on left side., HTN, HLD, CM, CAD, MI, stent, OSA, seizures   OT comments  Educated pt/wife on compensatory techniques for visual loss regarding use of lighting, contrast and organization/to increase function and reduce risk of falls. Pt states that he was driving after his last stroke. Emphasized importance of refraining from driving. Continue to recommend pt follow up at the low vision clinic at the neuro outpt center.   Follow Up Recommendations  Outpatient OT;Supervision/Assistance - 24 hour    Equipment Recommendations  None recommended by OT    Recommendations for Other Services      Precautions / Restrictions Precautions Precautions: Fall Precaution Comments: Decreased vision       Mobility Bed Mobility Mod I                  Transfers       Sit to Stand: Min assist              Balance                                   ADL    Educated pt on use of increasing contrast, improving lighting, especially task lighting and keeping areas uncluttered and organized to increase functional use of vision. Wife present for education. Also discussed mobility given new deficits and home modifications to reduce risk of falls. Wife/pt verbalied understanding.                                             Vision Eye Alignment: Within Functional Limits   Ocular Range of Motion: Within Functional Limits   Saccades: Additional eye shifts occurred during testing;Additional head turns occurred during testing       Additional Comments: Pt with difficulty seeing in R field and when object passed in front of his central  vision, states he can see it moving in and out of his vision.   Pt with limited visual field, although used an organized scanning pattern.   Perception     Praxis      Cognition   Behavior During Therapy: WFL for tasks assessed/performed Overall Cognitive Status: Within Functional Limits for tasks assessed                       Extremity/Trunk Assessment               Exercises     Shoulder Instructions       General Comments      Pertinent Vitals/ Pain       Pain Assessment: No/denies pain  Home Living                                          Prior Functioning/Environment              Frequency  Min 2X/week  Progress Toward Goals  OT Goals(current goals can now be found in the care plan section)  Progress towards OT goals: Progressing toward goals  Acute Rehab OT Goals Patient Stated Goal: return home OT Goal Formulation: With patient/family Time For Goal Achievement: 03/29/16 Potential to Achieve Goals: Good ADL Goals Pt Will Transfer to Toilet: ambulating;regular height toilet;with supervision Pt Will Perform Toileting - Clothing Manipulation and hygiene: with supervision;sit to/from stand Pt Will Perform Tub/Shower Transfer: Shower transfer;with supervision;ambulating Pt/caregiver will Perform Home Exercise Program: Increased ROM;Increased strength;Left upper extremity;Independently;With written HEP provided Additional ADL Goal #1: Pt will independently identify 5 objects in R visual field during ADL.  Plan Discharge plan remains appropriate    Co-evaluation                 End of Session     Activity Tolerance Patient tolerated treatment well   Patient Left in bed;with call bell/phone within reach;with bed alarm set;with family/visitor present   Nurse Communication Mobility status        Time: 1210-1230 OT Time Calculation (min): 20 min  Charges: OT General Charges $OT Visit: 1  Procedure OT Treatments $Self Care/Home Management : 8-22 mins  Jessilynn Taft,HILLARY 03/16/2016, 5:09 PM   [facility] Marena Witts, OTR/L  [phone] 03/16/2016

## 2016-03-16 NOTE — Care Management Note (Signed)
Case Management Note  Patient Details  Name: Ronney LionRoy Hymas MRN: [id number] Date of Birth: [birth year]  Subjective/Objective:                 Patient from home with spouse. + CVA, no HH needs.    Action/Plan:  Anticipate DC to home today after echo.  Expected Discharge Date:                  Expected Discharge Plan:  Home/Self Care  In-House Referral:  NA  Discharge planning Services  CM Consult  Post Acute Care Choice:  NA Choice offered to:  NA  DME Arranged:    DME Agency:     HH Arranged:    HH Agency:     Status of Service:  Completed, signed off  If discussed at [employer] Length of Stay Meetings, dates discussed:    Additional Comments:  Lawerance SabalDebbie Dez Stauffer, RN 03/16/2016, 10:30 AM

## 2016-03-16 NOTE — Progress Notes (Signed)
SLP Cancellation Note  Patient Details Name: Shaun Pugh MRN: [id number] DOB: [birth year]   Cancelled treatment:       Reason Eval/Treat Not Completed: SLP screened, no needs identified, will sign off   Effa Yarrow, Riley NearingBonnie Caroline 03/16/2016, 11:20 AM

## 2016-03-17 DIAGNOSIS — Z794 Long term (current) use of insulin: Secondary | ICD-10-CM

## 2016-03-17 DIAGNOSIS — H538 Other visual disturbances: Secondary | ICD-10-CM

## 2016-03-17 DIAGNOSIS — E1159 Type 2 diabetes mellitus with other circulatory complications: Secondary | ICD-10-CM

## 2016-03-17 LAB — VAS US CAROTID
LCCAPSYS: 55 cm/s
LEFT ECA DIAS: -13 cm/s
LEFT VERTEBRAL DIAS: 21 cm/s
LICADDIAS: -20 cm/s
LICAPSYS: -58 cm/s
Left CCA dist dias: 11 cm/s
Left CCA dist sys: 43 cm/s
Left CCA prox dias: 17 cm/s
Left ICA dist sys: -44 cm/s
Left ICA prox dias: -20 cm/s
RCCADSYS: -39 cm/s
RCCAPDIAS: 16 cm/s
RIGHT ECA DIAS: -19 cm/s
RIGHT VERTEBRAL DIAS: -3 cm/s
Right CCA prox sys: 61 cm/s

## 2016-03-17 LAB — GLUCOSE, CAPILLARY
GLUCOSE-CAPILLARY: 198 mg/dL — AB (ref 65–99)
Glucose-Capillary: 131 mg/dL — ABNORMAL HIGH (ref 65–99)

## 2016-03-17 MED ORDER — WARFARIN SODIUM 5 MG PO TABS
7.5000 mg | ORAL_TABLET | Freq: Every day | ORAL | 0 refills | Status: DC
Start: 2016-03-17 — End: 2016-06-24

## 2016-03-17 MED ORDER — COUMADIN BOOK
Freq: Once | Status: AC
  Administered 2016-03-17: 14:00:00
  Filled 2016-03-17 (×2): qty 1

## 2016-03-17 MED ORDER — CEPHALEXIN 500 MG PO CAPS: 500.0000 mg | ORAL_CAPSULE | Freq: Two times a day (BID) | ORAL | 0 refills | Status: DC

## 2016-03-17 MED ORDER — ASPIRIN 325 MG PO TABS: 325.0000 mg | ORAL_TABLET | Freq: Every day | ORAL | 0 refills | Status: DC

## 2016-03-17 MED ORDER — WARFARIN SODIUM 5 MG PO TABS: 10.0000 mg | ORAL_TABLET | Freq: Once | ORAL | Status: DC

## 2016-03-17 MED ORDER — WARFARIN VIDEO: Freq: Once | Status: DC

## 2016-03-17 MED ORDER — WARFARIN - PHARMACIST DOSING INPATIENT: Freq: Every day | Status: DC

## 2016-03-17 NOTE — Progress Notes (Signed)
Occupational Therapy Treatment Patient Details Name: Shaun Pugh MRN: [id number] DOB: [birth year] Today's Date: 03/17/2016    History of present illness Patient is a 65 yo male admitted 03/13/16 with sudden loss of vision in Rt eye.  MRI report states Lt occipital PCA infarct.    PMH:  CVA with Lt hemiparesis and vision loss on left side., HTN, HLD, CM, CAD, MI, stent, OSA, seizures   OT comments  Pt states vision is improving. Education on compensatory strategies it improve functional vision at home. Continue to recommend follow up with the low vision clinic with OT at the neuro outpt center.  Follow Up Recommendations  Outpatient OT;Supervision/Assistance - 24 hour    Equipment Recommendations  None recommended by OT    Recommendations for Other Services      Precautions / Restrictions Precautions Precautions: Fall Precaution Comments: Decreased vision Restrictions Weight Bearing Restrictions: No                                                                                             Vision                 Additional Comments: Pt states vision is improving. Unable to maintain visual fixation on target during pursuits. Eyes "jump". Pt states objects move in and out of his vision. Educated pt/wife on compensatory strategies for visual loss and increasing safety/reducing risk of falls.    Perception     Praxis      Cognition    at baseline                         Extremity/Trunk Assessment   at baseline            Exercises     Shoulder Instructions       General Comments      Pertinent Vitals/ Pain        no c/o pain  Home Living                                          Prior Functioning/Environment              Frequency           Progress Toward Goals  OT Goals(current goals can now be found in the care plan section)  Progress towards OT goals: Goals  met/education completed, patient discharged from OT  Acute Rehab OT Goals Patient Stated Goal: return home OT Goal Formulation: With patient/family Time For Goal Achievement: 03/29/16 Potential to Achieve Goals: Good ADL Goals Pt Will Transfer to Toilet: ambulating;regular height toilet;with supervision Pt Will Perform Toileting - Clothing Manipulation and hygiene: with supervision;sit to/from stand Pt Will Perform Tub/Shower Transfer: Shower transfer;with supervision;ambulating Pt/caregiver will Perform Home Exercise Program: Increased ROM;Increased strength;Left upper extremity;Independently;With written HEP provided Additional ADL Goal #1: Pt will independently identify 5 objects in R visual field during ADL.  Plan Discharge plan remains appropriate;All goals met and education completed, patient discharged from OT services (  follow up with outpt OT)    Co-evaluation                 End of Session     Activity Tolerance Patient tolerated treatment well   Patient Left in bed;with call bell/phone within reach;with bed alarm set;with family/visitor present   Nurse Communication Mobility status        Time: 5301-0404 OT Time Calculation (min): 12 min  Charges: OT General Charges $OT Visit: 1 Procedure OT Treatments $Self Care/Home Management : 8-22 mins  Shaun Pugh,Shaun Pugh 03/17/2016, 11:02 AM  Shaun Pugh, OT/L  [phone] 03/17/2016

## 2016-03-17 NOTE — Progress Notes (Addendum)
Nsg Discharge Note  Admit Date:  03/13/2016 Discharge date: 03/17/2016   Shaun Pugh to be D/C'd home per MD order.  AVS completed.  Copy for chart, and copy for patient signed, and dated. Patient/caregiver able to verbalize understanding.  Discharge Medication:   Medication List    STOP taking these medications   ALEVE 220 MG tablet Generic drug:  naproxen sodium     TAKE these medications   aspirin 325 MG tablet Take 1 tablet (325 mg total) by mouth daily. Stop aspirin when your INR level is more than 2   calcium-vitamin D 500-200 MG-UNIT tablet Commonly known as:  OSCAL WITH D Take 1 tablet by mouth daily with breakfast.   cephALEXin 500 MG capsule Commonly known as:  KEFLEX Take 1 capsule (500 mg total) by mouth 2 (two) times daily.   clobetasol cream 0.05 % Commonly known as:  TEMOVATE Apply 1 application topically 2 (two) times daily.   escitalopram 5 MG tablet Commonly known as:  LEXAPRO Take 1 tablet by mouth daily.   Fish Oil 1000 MG Caps Take 1 capsule by mouth daily.   fluticasone 0.05 % cream Commonly known as:  CUTIVATE Apply topically 2 (two) times daily.   glipiZIDE 10 MG 24 hr tablet Commonly known as:  GLUCOTROL XL Take 10 mg by mouth daily with breakfast.   lisinopril-hydrochlorothiazide 10-12.5 MG tablet Commonly known as:  PRINZIDE,ZESTORETIC Take 1 tablet by mouth daily.   metFORMIN 500 MG tablet Commonly known as:  GLUCOPHAGE Take 500 mg by mouth 2 (two) times daily with a meal.   pantoprazole 40 MG tablet Commonly known as:  PROTONIX Take 1 tablet by mouth daily.   vitamin B-12 1000 MCG tablet Commonly known as:  CYANOCOBALAMIN Take 1,000 mcg by mouth daily.   warfarin 5 MG tablet Commonly known as:  COUMADIN Take 1.5 tablets (7.5 mg total) by mouth daily at 6 PM.       Discharge Assessment: Vitals:   03/16/16 2127 03/17/16 0502  BP: (!) 156/75 (!) 141/62  Pulse: 92 88  Resp: 18 18  Temp: 98 F (36.7 C) 98.2 F (36.8  C)   Skin clean, dry and intact without evidence of skin break down, no evidence of skin tears noted. IV catheter discontinued intact. Site without signs and symptoms of complications - no redness or edema noted at insertion site, patient denies c/o pain - only slight tenderness at site.  Dressing with slight pressure applied.  D/c Instructions-Education: Discharge instructions given to patient/family with verbalized understanding. D/c education completed with patient/family including follow up instructions, medication list, d/c activities limitations if indicated, with other d/c instructions as indicated by MD - patient able to verbalize understanding, all questions fully answered. Patient instructed to return to ED, call 911, or call MD for any changes in condition. Verified with patient he has all belongings.  Patient escorted via WC, and D/C home via private auto.  Lenord CarboAubrey  Thorn Demas, RN 03/17/2016 1:54 PM

## 2016-03-17 NOTE — Progress Notes (Signed)
ANTICOAGULATION CONSULT NOTE - Initial Consult  Pharmacy Consult for Coumadin Indication: subclavian thrombus and CVA  No Known Allergies  Patient Measurements: Height: 6\' 1"  (185.4 cm) Weight: 267 lb 1.6 oz (121.2 kg) IBW/kg (Calculated) : 79.9   Vital Signs: Temp: 98.2 F (36.8 C) (11/07 0502) Temp Source: Oral (11/07 0502) BP: 141/62 (11/07 0502) Pulse Rate: 88 (11/07 0502)  Labs:  Recent Labs  03/15/16 0518  HGB 13.8  HCT 40.2  PLT 199  CREATININE 1.46*    Estimated Creatinine Clearance: 67.9 mL/min (by C-G formula based on SCr of 1.46 mg/dL (H)).   Medical History: Past Medical History:  Diagnosis Date  . Acute pancreatitis 09/14/2011  . Atrophy of right kidney 09/15/2011  . CAD (coronary artery disease)    HX OF TWO HEART STENTS PLACED APPROX 10 OR 11 YRS AGO- STATES RELEASED BY CARDIOLOGIST- NO LONGER SEES  . Chronic back pain   . Diabetes (HCC)   . High cholesterol   . Hypertension   . Kidney calculi 09/15/2011  . Myocardial infarction   . Seizures (HCC)    AFTER THE STROKE - BUT NONE SINCE  . Sleep apnea    STATES TRIED CPAP - DID NOT TOLERATE - RETURNED CPAP - DOES NOT HAVE ANYMORE  . Stented coronary artery approx 2004  . Stroke (HCC) 03/18/2012   LEFT SIDED WEAKNESS - STILL HAS SOME LUE>LLE WEAKNESS - ABLE TO AMBULATE WITH OR WITHOUT CANE BUT DOES NOT HAVE USE OF LEFT ARM  . TIA (transient ischemic attack)     Medications:  Prescriptions Prior to Admission  Medication Sig Dispense Refill Last Dose  . calcium-vitamin D (OSCAL WITH D) 500-200 MG-UNIT tablet Take 1 tablet by mouth daily with breakfast.   03/13/2016 at Unknown time  . clobetasol cream (TEMOVATE) 0.05 % Apply 1 application topically 2 (two) times daily.   Past Month at Unknown time  . escitalopram (LEXAPRO) 5 MG tablet Take 1 tablet by mouth daily.  2 03/13/2016 at Unknown time  . fluticasone (CUTIVATE) 0.05 % cream Apply topically 2 (two) times daily.   Past Month at Unknown time  .  glipiZIDE (GLUCOTROL XL) 10 MG 24 hr tablet Take 10 mg by mouth daily with breakfast.   03/13/2016 at Unknown time  . lisinopril-hydrochlorothiazide (PRINZIDE,ZESTORETIC) 10-12.5 MG tablet Take 1 tablet by mouth daily.  0 03/13/2016 at Unknown time  . metFORMIN (GLUCOPHAGE) 500 MG tablet Take 500 mg by mouth 2 (two) times daily with a meal.   03/13/2016 at Unknown time  . naproxen sodium (ALEVE) 220 MG tablet Take 220 mg by mouth 2 (two) times daily with a meal.   03/13/2016 at Unknown time  . Omega-3 Fatty Acids (FISH OIL) 1000 MG CAPS Take 1 capsule by mouth daily.   03/13/2016 at Unknown time  . pantoprazole (PROTONIX) 40 MG tablet Take 1 tablet by mouth daily.  2 03/13/2016 at Unknown time  . vitamin B-12 (CYANOCOBALAMIN) 1000 MCG tablet Take 1,000 mcg by mouth daily.   03/13/2016 at Unknown time   Scheduled:  . aspirin  300 mg Rectal Daily   Or  . aspirin  325 mg Oral Daily  . atorvastatin  20 mg Oral q1800  . cefTRIAXone (ROCEPHIN)  IV  1 g Intravenous Q24H  . enoxaparin (LOVENOX) injection  40 mg Subcutaneous Q24H  . escitalopram  5 mg Oral Daily  . insulin aspart  0-15 Units Subcutaneous TID WC  . pantoprazole  40 mg Oral Daily  Assessment: 65yo male admitted 11/4 w/ acute CVA, also found w/ subclavian thrombus, to start Coumadin as recommended by neuro.  Goal of Therapy:  INR 2-3   Plan:  Will give Coumadin 10mg  po x1 and monitor INR for dose adjustments; will begin Coumadin education.  Vernard GamblesVeronda Maryagnes Carrasco, PharmD, BCPS  03/17/2016,7:26 AM

## 2016-03-17 NOTE — Progress Notes (Signed)
Coumadin Education completed with patient and wife.  Verbalized understanding.  All questions answered.  Toys 'R' UsKimberly Selia Wareing, Pharm.D., BCPS Clinical Pharmacist Pager [phone] 03/17/2016 10:40 AM

## 2016-03-17 NOTE — Care Management Important Message (Signed)
Important Message  Patient Details  Name: Shaun Pugh MRN: [id number] Date of Birth: [birth year]   Medicare Important Message Given:  Yes    Shaun Pugh 03/17/2016, 9:22 AM

## 2016-03-17 NOTE — Discharge Summary (Signed)
PATIENT DETAILS Name: Shaun Pugh Age: 65 y.o. Sex: male Date of Birth: 26-Jun-1950 MRN: [id number]. Admitting Physician: [name], MD WUJ:[id number] Shaun Aye, MD  Admit Date: 03/13/2016 Discharge date: 03/17/2016  Recommendations for Outpatient Follow-up:  1. Follow up with PCP on 11/9 on 11/10 for INR check and post hospital discharge visit 2. Stop aspirin once INR is more than 2 3. Ensure follow-up with Dr. [name] you and the stroke clinic 4. Will require a repeat CT angiogram of the neck in 3-6 months 5. Please obtain BMP/CBC in one week 6. Intolerant to statins-consider PCSK-9 inhibitors 7. Follow urine cultures until final  Admitted From:  Home   Disposition: Home   Home Health: No  Equipment/Devices: None  Discharge Condition: Stable  CODE STATUS: FULL CODE  Diet recommendation:  Heart Healthy / Carb Modified  Brief Summary: See H&P, Labs, Consult and Test reports for all details in brief, Patient is a 65 y.o. male with prior history of CVA with resultant left eye blindness and hemiparesis (LUE>LLE) presented with sudden onset of visual loss from her right eye. MRI of the brain confirms left occipital PCA infarct. See below for further details  Brief Hospital Course: Acute CVA: Presenting as right-sided visual loss, MRI of the brain confirms acute left occipital PCA infarct, it also shows a large chronic right cerebral hemispheric infarct. Patient already had very poor vision in his left eye, and unfortunately developed right eye visual loss-pain-fortunately this continues to improve. Has chronic left-sided hemiparesis (LUE>LLE) that is essentially unchanged. Carotid Doppler does not show any significant stenosis-but CTA neck/head shows 80-90% stenosis in the intracranial portion of right intracranial carotid artery-also shows thrombus in the left subclavian artery. [name] (stroke M.D.) has discussed with patient/family and is recommending initiating Coumadin. I  subsequently spoke with the patient/ family on the day of discharge, and they are agreeable. Recommendations are to continue with aspirin until INR is therapeutic.A1C is 6.2, Echo did not show any embolic foci-EF was preserved.  LDL elevated at 113- per spouse-he has been intolerant to several statins in the past and are requesting not to be restarted on any statins at this time-suggest that PCP consider initiation of PCSK9 inhibitor as an outpatient.  UTI: Still awaiting Urine cx-started Rocephin-has symptoms and a positive UA-at risk for UTI-due to chronic neurogenic bladder and urinary retention issues. We will transition to Keflex on discharge, have instructed spouse to make sure that PCP follows urine culture at follow-up  Hypertension: Pemissive hypertension was allowed, plans are to resume antihypertensives on discharge.   Type 2 diabetes: Continue to hold oral hypoglycemics, CBGs stable with SSI.  Chronic kidney disease stage III: Creatinine close to usual baseline, follow  Chronic urinary retention: Per spouse-the patient has had issues with urinary retention for a while-and has had urology evaluation-and likely has a neurogenic bladder. He is now able to void at the bedside commode-he was monitored with serial bladder ultrasounds.   Procedures/Studies: Carotid/Dopplers 11/4>>-39% ICA plaquing. Vertebral artery flow is antegrade Echo 11/6>>EF 50-55%   Discharge Diagnoses:  Principal Problem:   CVA (cerebral vascular accident) [facility]) Active Problems:   Hemiplegia affecting nondominant side (HCC)   Diabetes (HCC)   Essential hypertension   Hyperlipidemia   Blurry vision, bilateral   Discharge Instructions:  Activity:  As tolerated with Full fall precautions use walker/cane & assistance as needed  Discharge Instructions    Ambulatory referral to Neurology    Complete by:  As directed    Pt will  follow up with Dr. Roda Shutters at [facility] in about 2 months. Thanks.   Call MD for:   persistant dizziness or light-headedness    Complete by:  As directed    Diet - low sodium heart healthy    Complete by:  As directed    Diet Carb Modified    Complete by:  As directed    Discharge instructions    Complete by:  As directed    Follow with Primary MD  [name], MD  and [name] (stroke MD)  Please make an appointment with your primary care practitioner  on 11/9 on 11/10 for a INR check. Stop aspirin once you're INR is more than 2  Please get a complete blood count and chemistry panel checked by your Primary MD at your next visit, and again as instructed by your Primary MD.  Get Medicines reviewed and adjusted: Please take all your medications with you for your next visit with your Primary MD  Laboratory/radiological data: Please request your Primary MD to go over all hospital tests and procedure/radiological results at the follow up, please ask your Primary MD to get all Hospital records sent to his/her office.  In some cases, they will be blood work, cultures and biopsy results pending at the time of your discharge. Please request that your primary care M.D. follows up on these results.  Also Note the following: If you experience worsening of your admission symptoms, develop shortness of breath, life threatening emergency, suicidal or homicidal thoughts you must seek medical attention immediately by calling 911 or calling your MD immediately  if symptoms less severe.  You must read complete instructions/literature along with all the possible adverse reactions/side effects for all the Medicines you take and that have been prescribed to you. Take any new Medicines after you have completely understood and accpet all the possible adverse reactions/side effects.   Do not drive when taking Pain medications or sleeping medications (Benzodaizepines)  Do not take more than prescribed Pain, Sleep and Anxiety Medications. It is not advisable to combine anxiety,sleep and pain  medications without talking with your primary care practitioner  Special Instructions: If you have smoked or chewed Tobacco  in the last 2 yrs please stop smoking, stop any regular Alcohol  and or any Recreational drug use.  Wear Seat belts while driving.  Please note: You were cared for by a hospitalist during your hospital stay. Once you are discharged, your primary care physician will handle any further medical issues. Please note that NO REFILLS for any discharge medications will be authorized once you are discharged, as it is imperative that you return to your primary care physician (or establish a relationship with a primary care physician if you do not have one) for your post hospital discharge needs so that they can reassess your need for medications and monitor your lab values.   Increase activity slowly    Complete by:  As directed        Medication List    STOP taking these medications   ALEVE 220 MG tablet Generic drug:  naproxen sodium     TAKE these medications   aspirin 325 MG tablet Take 1 tablet (325 mg total) by mouth daily. Stop aspirin when your INR level is more than 2   calcium-vitamin D 500-200 MG-UNIT tablet Commonly known as:  OSCAL WITH D Take 1 tablet by mouth daily with breakfast.   cephALEXin 500 MG capsule Commonly known as:  KEFLEX Take 1 capsule (  500 mg total) by mouth 2 (two) times daily.   clobetasol cream 0.05 % Commonly known as:  TEMOVATE Apply 1 application topically 2 (two) times daily.   escitalopram 5 MG tablet Commonly known as:  LEXAPRO Take 1 tablet by mouth daily.   Fish Oil 1000 MG Caps Take 1 capsule by mouth daily.   fluticasone 0.05 % cream Commonly known as:  CUTIVATE Apply topically 2 (two) times daily.   glipiZIDE 10 MG 24 hr tablet Commonly known as:  GLUCOTROL XL Take 10 mg by mouth daily with breakfast.   lisinopril-hydrochlorothiazide 10-12.5 MG tablet Commonly known as:  PRINZIDE,ZESTORETIC Take 1 tablet by  mouth daily.   metFORMIN 500 MG tablet Commonly known as:  GLUCOPHAGE Take 500 mg by mouth 2 (two) times daily with a meal.   pantoprazole 40 MG tablet Commonly known as:  PROTONIX Take 1 tablet by mouth daily.   vitamin B-12 1000 MCG tablet Commonly known as:  CYANOCOBALAMIN Take 1,000 mcg by mouth daily.   warfarin 5 MG tablet Commonly known as:  COUMADIN Take 1.5 tablets (7.5 mg total) by mouth daily at 6 PM.      Follow-up Information    Xu,Jindong, MD. Schedule an appointment as soon as possible for a visit in 6 week(s).   Specialty:  Neurology Contact information: [address] [place] [address] [phone]        [name], MD. Go on 03/20/2016.   Specialty:  Internal Medicine Why:  for INR check and post hospital discharge visit Contact information: [address] [place] [address] [phone]          No Known Allergies  Consultations:   neurology   Other Procedures/Studies: Ct Angio Head W Or Wo Contrast  Result Date: 03/16/2016 CLINICAL DATA:  Stroke.  Left PCA infarct. EXAM: CT ANGIOGRAPHY HEAD AND NECK TECHNIQUE: Multidetector CT imaging of the head and neck was performed using the standard protocol during bolus administration of intravenous contrast. Multiplanar CT image reconstructions and MIPs were obtained to evaluate the vascular anatomy. Carotid stenosis measurements (when applicable) are obtained utilizing NASCET criteria, using the distal internal carotid diameter as the denominator. CONTRAST:  50 mL IV Isovue 370 COMPARISON:  Brain MRI 03/14/2016, CTA 03/17/2012 FINDINGS: CT HEAD FINDINGS Brain: There is encephalomalacia and within the right frontal, parietal and occipital lobes, compatible with remote infarcts. There is hypoattenuation in the left PCA distribution that is consistent with the known acute left PCA infarct. There is no midline shift or significant mass effect. Prominence of the ventricles, greater  than expected for age, persist. There is no intracranial hemorrhage. Vascular: No hyperdense vessel or unexpected calcification. Skull: Normal visualized skull base, calvarium and extracranial soft tissues. Sinuses/Orbits: No sinus fluid levels or advanced mucosal thickening. No mastoid effusion. Normal orbits. Review of the MIP images confirms the above findings CTA NECK FINDINGS Aortic arch: There is aortic atherosclerosis. There is E centric thrombus within the anterior aspect of the left subclavian artery (series 502, image 48). The right subclavian artery is normal. There is a linear filling defect within the distal aspect of the ascending thoracic aorta (image 23). Right carotid system: There is E eccentric noncalcified plaque within the proximal right internal carotid artery. This results in severe stenosis, measuring approximately 80-90%. This has worsened compared to the prior study of 03/17/2012. There is a web-like component of the plaque extending superiorly that is also increased from the prior exam. There is no focal stenosis  of the remainder of the cervical right ICA. Left carotid system: There is partially calcified atherosclerotic disease of the left carotid bifurcation without hemodynamically significant stenosis. Vertebral arteries: Both vertebral artery origins are widely patent. The vertebral system is left dominant. No focal stenosis, aneurysm or dissection of the vertebral arteries to the level of the basilar. The diminutive right vertebral artery terminates in the posterior inferior cerebellar artery. Skeleton: Negative Other neck: Negative Upper chest: Unremarkable. Review of the MIP images confirms the above findings CTA HEAD FINDINGS Anterior circulation: --Intracranial internal carotid arteries: Mild bilateral calcification of the proximal intracranial internal carotid arteries without hemodynamically significant stenosis. 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 Otherwise normal. --Middle cerebral arteries: There is mild narrowing of the mid M1 segment of the left MCA. The right MCA is normal. --Posterior communicating arteries: Small P-comm is present on the right. Not clearly seen on the left. Posterior circulation: --Posterior cerebral arteries: Normal. --Superior cerebellar arteries: Normal. --Basilar artery: Normal. --Anterior inferior cerebellar arteries: Not visualized, which is not uncommon. --Posterior inferior cerebellar arteries: The right vertebral artery terminates in the posterior inferior cerebellar artery. Normal left PICA. Venous sinuses: As permitted by contrast timing, patent. Anatomic variants: Normal congenital variant hypoplastic right A1 segment. Delayed phase: No abnormal intracranial enhancement. Review of the MIP images confirms the above findings IMPRESSION: 1. Re- demonstration of findings of acute left PCA infarct. Chronic extensive right hemispheric encephalomalacia. 2. No intracranial arterial occlusion. 3. Severe stenosis of the proximal right intracranial carotid artery, measuring 80 and 90%, worsened from the prior study. This is secondary to large volume non calcified, eccentric atherosclerotic plaque. 4. Large amount of plaque vs chronic thrombus within the proximal left subclavian artery, slightly worsened compared to 03/17/2012. 5. Linear filling defect within the aortic arch is decreased compared to 03/17/2012. Electronically Signed   By: Deatra Robinson M.D.   On: 03/16/2016 04:32   Ct Head Wo Contrast  Result Date: 03/13/2016 CLINICAL DATA:  Headache EXAM: CT HEAD WITHOUT CONTRAST TECHNIQUE: Contiguous axial images were obtained from the base of the skull through the vertex without intravenous contrast. COMPARISON:  03/20/2012, 03/17/2012 FINDINGS: Brain: There is no acute hemorrhage identified. There is no focal mass, mass effect or midline shift. Evidence of large old right-sided infarct involving the  occipital, temporal, frontal and parietal lobes. Mild associated distortion of right lateral ventricle. There is hypodensity within the left paracentral occipital lobe, new compared with 2013 study. No significant surrounding mass effect. Periventricular white matter hypodensities consistent with small vessel disease. Old appearing lacunar infarcts within the bilateral the thalamus. Old small infarct in the right cerebellar hemisphere. Vascular: No hyperdense vessels. Mild calcifications within the carotid arteries at the skullbase. Skull: No fracture. Minimal opacifications of the inferior mastoids. Sinuses/Orbits: Mild mucosal thickening in paranasal sinuses. No acute orbital abnormality. Other: None IMPRESSION: 1. No acute hemorrhage or focal mass lesion visualized. 2. Evidence of old large right-sided hemispheric infarct. 3. Focal hypodense area within the left parasagittal occipital lobe, new since 2013, likely old given absence of significant surrounding mass effect, however MRI could be obtained to confirm that this does not represent subacute ischemic change. 4. Old lacunar infarcts within the thalamus. Old right cerebellar infarct. Electronically Signed   By: Jasmine Pang M.D.   On: 03/13/2016 20:23   Ct Angio Neck W Or Wo Contrast  Result Date: 03/16/2016 CLINICAL DATA:  Stroke.  Left PCA infarct. EXAM: CT ANGIOGRAPHY HEAD AND NECK TECHNIQUE: Multidetector  CT imaging of the head and neck was performed using the standard protocol during bolus administration of intravenous contrast. Multiplanar CT image reconstructions and MIPs were obtained to evaluate the vascular anatomy. Carotid stenosis measurements (when applicable) are obtained utilizing NASCET criteria, using the distal internal carotid diameter as the denominator. CONTRAST:  50 mL IV Isovue 370 COMPARISON:  Brain MRI 03/14/2016, CTA 03/17/2012 FINDINGS: CT HEAD FINDINGS Brain: There is encephalomalacia and within the right frontal, parietal and  occipital lobes, compatible with remote infarcts. There is hypoattenuation in the left PCA distribution that is consistent with the known acute left PCA infarct. There is no midline shift or significant mass effect. Prominence of the ventricles, greater than expected for age, persist. There is no intracranial hemorrhage. Vascular: No hyperdense vessel or unexpected calcification. Skull: Normal visualized skull base, calvarium and extracranial soft tissues. Sinuses/Orbits: No sinus fluid levels or advanced mucosal thickening. No mastoid effusion. Normal orbits. Review of the MIP images confirms the above findings CTA NECK FINDINGS Aortic arch: There is aortic atherosclerosis. There is E centric thrombus within the anterior aspect of the left subclavian artery (series 502, image 48). The right subclavian artery is normal. There is a linear filling defect within the distal aspect of the ascending thoracic aorta (image 23). Right carotid system: There is E eccentric noncalcified plaque within the proximal right internal carotid artery. This results in severe stenosis, measuring approximately 80-90%. This has worsened compared to the prior study of 03/17/2012. There is a web-like component of the plaque extending superiorly that is also increased from the prior exam. There is no focal stenosis of the remainder of the cervical right ICA. Left carotid system: There is partially calcified atherosclerotic disease of the left carotid bifurcation without hemodynamically significant stenosis. Vertebral arteries: Both vertebral artery origins are widely patent. The vertebral system is left dominant. No focal stenosis, aneurysm or dissection of the vertebral arteries to the level of the basilar. The diminutive right vertebral artery terminates in the posterior inferior cerebellar artery. Skeleton: Negative Other neck: Negative Upper chest: Unremarkable. Review of the MIP images confirms the above findings CTA HEAD FINDINGS  Anterior circulation: --Intracranial internal carotid arteries: Mild bilateral calcification of the proximal intracranial internal carotid arteries without hemodynamically significant stenosis. --Anterior cerebral arteries: Hypoplastic right A1 segment is a congenital variant. Otherwise normal. --Middle cerebral arteries: There is mild narrowing of the mid M1 segment of the left MCA. The right MCA is normal. --Posterior communicating arteries: Small P-comm is present on the right. Not clearly seen on the left. Posterior circulation: --Posterior cerebral arteries: Normal. --Superior cerebellar arteries: Normal. --Basilar artery: Normal. --Anterior inferior cerebellar arteries: Not visualized, which is not uncommon. --Posterior inferior cerebellar arteries: The right vertebral artery terminates in the posterior inferior cerebellar artery. Normal left PICA. Venous sinuses: As permitted by contrast timing, patent. Anatomic variants: Normal congenital variant hypoplastic right A1 segment. Delayed phase: No abnormal intracranial enhancement. Review of the MIP images confirms the above findings IMPRESSION: 1. Re- demonstration of findings of acute left PCA infarct. Chronic extensive right hemispheric encephalomalacia. 2. No intracranial arterial occlusion. 3. Severe stenosis of the proximal right intracranial carotid artery, measuring 80 and 90%, worsened from the prior study. This is secondary to large volume non calcified, eccentric atherosclerotic plaque. 4. Large amount of plaque vs chronic thrombus within the proximal left subclavian artery, slightly worsened compared to 03/17/2012. 5. Linear filling defect within the aortic arch is decreased compared to 03/17/2012. Electronically Signed   By: Deatra Robinson  M.D.   On: 03/16/2016 04:32   Mr Brain Wo Contrast  Result Date: 03/14/2016 CLINICAL DATA:  Acute onset visual loss.  Prior stroke. EXAM: MRI HEAD WITHOUT CONTRAST MRA HEAD WITHOUT CONTRAST TECHNIQUE:  Multiplanar, multiecho pulse sequences of the brain and surrounding structures were obtained without intravenous contrast. Angiographic images of the head were obtained using MRA technique without contrast. COMPARISON:  Head CT 03/13/2016 and CTA 03/17/2012 FINDINGS: MRI HEAD FINDINGS The examination had to be discontinued prior to completion due to patient claustrophobia. Axial and coronal diffusion, sagittal T1, and axial T2* gradient echo sequences were obtained. There is a 5 cm acute infarct in the left PCA territory involving the medial occipital lobe. A focus of microhemorrhage is noted near the posterior superior aspect of the infarct. A large chronic right cerebral hemispheric infarct is again seen with associated chronic blood products and ex vacuo enlargement of the right lateral ventricle. There are also small chronic infarcts in the thalami and cerebellum bilaterally. There is moderate cerebral atrophy. No significant mass effect or evidence of extra-axial fluid collection on this limited study. MRA HEAD FINDINGS The study is moderately to severely motion degraded. The visualized distal left vertebral artery is patent. No flow related enhancement is seen in the distal right vertebral artery, which was patent but hypoplastic on the prior CTA. The basilar artery is patent without evidence of significant stenosis. The P1 segments and proximal left P2 are grossly patent. Signal dropout at the level of the proximal right P2 segment may reflect a high-grade stenosis or segmental occlusion, with evaluation severely limited by motion artifact through this level. The internal carotid arteries are patent from skullbase to carotid termini. Cavernous segment scratch sec cavernous and supraclinoid segment evaluation is limited by motion artifact. The M1 segments and left A1 segment are grossly patent. The right A1 segment was patent on the prior CTA but is not identified on this examination. Anterior circulation  branch vessel evaluation is severely limited by motion artifact, although there is a decreased number of right MCA branch vessels corresponding to the large chronic infarct. IMPRESSION: 1. Incomplete MRI.  Acute left occipital lobe PCA infarct. 2. Large chronic right cerebral hemispheric infarct. 3. Motion degraded head MRA. P1 and proximal left P2 segments are grossly patent. Suspected right P2 occlusion or high-grade stenosis. 4. Suspected interval distal right vertebral artery occlusion. Electronically Signed   By: Sebastian AcheAllen  Grady M.D.   On: 03/14/2016 13:47   Mr Maxine GlennMra Head/brain ZOWo Cm  Result Date: 03/14/2016 CLINICAL DATA:  Acute onset visual loss.  Prior stroke. EXAM: MRI HEAD WITHOUT CONTRAST MRA HEAD WITHOUT CONTRAST TECHNIQUE: Multiplanar, multiecho pulse sequences of the brain and surrounding structures were obtained without intravenous contrast. Angiographic images of the head were obtained using MRA technique without contrast. COMPARISON:  Head CT 03/13/2016 and CTA 03/17/2012 FINDINGS: MRI HEAD FINDINGS The examination had to be discontinued prior to completion due to patient claustrophobia. Axial and coronal diffusion, sagittal T1, and axial T2* gradient echo sequences were obtained. There is a 5 cm acute infarct in the left PCA territory involving the medial occipital lobe. A focus of microhemorrhage is noted near the posterior superior aspect of the infarct. A large chronic right cerebral hemispheric infarct is again seen with associated chronic blood products and ex vacuo enlargement of the right lateral ventricle. There are also small chronic infarcts in the thalami and cerebellum bilaterally. There is moderate cerebral atrophy. No significant mass effect or evidence of extra-axial fluid collection on  this limited study. MRA HEAD FINDINGS The study is moderately to severely motion degraded. The visualized distal left vertebral artery is patent. No flow related enhancement is seen in the distal right  vertebral artery, which was patent but hypoplastic on the prior CTA. The basilar artery is patent without evidence of significant stenosis. The P1 segments and proximal left P2 are grossly patent. Signal dropout at the level of the proximal right P2 segment may reflect a high-grade stenosis or segmental occlusion, with evaluation severely limited by motion artifact through this level. The internal carotid arteries are patent from skullbase to carotid termini. Cavernous segment scratch sec cavernous and supraclinoid segment evaluation is limited by motion artifact. The M1 segments and left A1 segment are grossly patent. The right A1 segment was patent on the prior CTA but is not identified on this examination. Anterior circulation branch vessel evaluation is severely limited by motion artifact, although there is a decreased number of right MCA branch vessels corresponding to the large chronic infarct. IMPRESSION: 1. Incomplete MRI.  Acute left occipital lobe PCA infarct. 2. Large chronic right cerebral hemispheric infarct. 3. Motion degraded head MRA. P1 and proximal left P2 segments are grossly patent. Suspected right P2 occlusion or high-grade stenosis. 4. Suspected interval distal right vertebral artery occlusion. Electronically Signed   By: Sebastian Ache M.D.   On: 03/14/2016 13:47     TODAY-DAY OF DISCHARGE:  Subjective:   Shaun Pugh today has no headache,no chest abdominal pain,no new weakness tingling or numbness, feels much better wants to go home today.   Objective:   Blood pressure (!) 141/62, pulse 88, temperature 98.2 F (36.8 C), temperature source Oral, resp. rate 18, height 6\' 1"  (1.854 m), weight 121.2 kg (267 lb 1.6 oz), SpO2 97 %.  Intake/Output Summary (Last 24 hours) at 03/17/16 0922 Last data filed at 03/17/16 0622  Gross per 24 hour  Intake               50 ml  Output              450 ml  Net             -400 ml   Filed Weights   03/13/16 1915 03/14/16 0105  Weight: 124.7  kg (275 lb) 121.2 kg (267 lb 1.6 oz)    Exam: Awake Alert, Oriented *3, No new F.N deficits, Normal affect .AT,PERRAL Supple Neck,No JVD, No cervical lymphadenopathy appriciated.  Symmetrical Chest wall movement, Good air movement bilaterally, CTAB RRR,No Gallops,Rubs or new Murmurs, No Parasternal Heave +ve B.Sounds, Abd Soft, Non tender, No organomegaly appriciated, No rebound -guarding or rigidity. No Cyanosis, Clubbing or edema, No new Rash or bruise   PERTINENT RADIOLOGIC STUDIES: Ct Angio Head W Or Wo Contrast  Result Date: 03/16/2016 CLINICAL DATA:  Stroke.  Left PCA infarct. EXAM: CT ANGIOGRAPHY HEAD AND NECK TECHNIQUE: Multidetector CT imaging of the head and neck was performed using the standard protocol during bolus administration of intravenous contrast. Multiplanar CT image reconstructions and MIPs were obtained to evaluate the vascular anatomy. Carotid stenosis measurements (when applicable) are obtained utilizing NASCET criteria, using the distal internal carotid diameter as the denominator. CONTRAST:  50 mL IV Isovue 370 COMPARISON:  Brain MRI 03/14/2016, CTA 03/17/2012 FINDINGS: CT HEAD FINDINGS Brain: There is encephalomalacia and within the right frontal, parietal and occipital lobes, compatible with remote infarcts. There is hypoattenuation in the left PCA distribution that is consistent with the known acute left PCA infarct. There is  no midline shift or significant mass effect. Prominence of the ventricles, greater than expected for age, persist. There is no intracranial hemorrhage. Vascular: No hyperdense vessel or unexpected calcification. Skull: Normal visualized skull base, calvarium and extracranial soft tissues. Sinuses/Orbits: No sinus fluid levels or advanced mucosal thickening. No mastoid effusion. Normal orbits. Review of the MIP images confirms the above findings CTA NECK FINDINGS Aortic arch: There is aortic atherosclerosis. There is E centric thrombus within the  anterior aspect of the left subclavian artery (series 502, image 48). The right subclavian artery is normal. There is a linear filling defect within the distal aspect of the ascending thoracic aorta (image 23). Right carotid system: There is E eccentric noncalcified plaque within the proximal right internal carotid artery. This results in severe stenosis, measuring approximately 80-90%. This has worsened compared to the prior study of 03/17/2012. There is a web-like component of the plaque extending superiorly that is also increased from the prior exam. There is no focal stenosis of the remainder of the cervical right ICA. Left carotid system: There is partially calcified atherosclerotic disease of the left carotid bifurcation without hemodynamically significant stenosis. Vertebral arteries: Both vertebral artery origins are widely patent. The vertebral system is left dominant. No focal stenosis, aneurysm or dissection of the vertebral arteries to the level of the basilar. The diminutive right vertebral artery terminates in the posterior inferior cerebellar artery. Skeleton: Negative Other neck: Negative Upper chest: Unremarkable. Review of the MIP images confirms the above findings CTA HEAD FINDINGS Anterior circulation: --Intracranial internal carotid arteries: Mild bilateral calcification of the proximal intracranial internal carotid arteries without hemodynamically significant stenosis. --Anterior cerebral arteries: Hypoplastic right A1 segment is a congenital variant. Otherwise normal. --Middle cerebral arteries: There is mild narrowing of the mid M1 segment of the left MCA. The right MCA is normal. --Posterior communicating arteries: Small P-comm is present on the right. Not clearly seen on the left. Posterior circulation: --Posterior cerebral arteries: Normal. --Superior cerebellar arteries: Normal. --Basilar artery: Normal. --Anterior inferior cerebellar arteries: Not visualized, which is not uncommon.  --Posterior inferior cerebellar arteries: The right vertebral artery terminates in the posterior inferior cerebellar artery. Normal left PICA. Venous sinuses: As permitted by contrast timing, patent. Anatomic variants: Normal congenital variant hypoplastic right A1 segment. Delayed phase: No abnormal intracranial enhancement. Review of the MIP images confirms the above findings IMPRESSION: 1. Re- demonstration of findings of acute left PCA infarct. Chronic extensive right hemispheric encephalomalacia. 2. No intracranial arterial occlusion. 3. Severe stenosis of the proximal right intracranial carotid artery, measuring 80 and 90%, worsened from the prior study. This is secondary to large volume non calcified, eccentric atherosclerotic plaque. 4. Large amount of plaque vs chronic thrombus within the proximal left subclavian artery, slightly worsened compared to 03/17/2012. 5. Linear filling defect within the aortic arch is decreased compared to 03/17/2012. Electronically Signed   By: Deatra Robinson M.D.   On: 03/16/2016 04:32   Ct Head Wo Contrast  Result Date: 03/13/2016 CLINICAL DATA:  Headache EXAM: CT HEAD WITHOUT CONTRAST TECHNIQUE: Contiguous axial images were obtained from the base of the skull through the vertex without intravenous contrast. COMPARISON:  03/20/2012, 03/17/2012 FINDINGS: Brain: There is no acute hemorrhage identified. There is no focal mass, mass effect or midline shift. Evidence of large old right-sided infarct involving the occipital, temporal, frontal and parietal lobes. Mild associated distortion of right lateral ventricle. There is hypodensity within the left paracentral occipital lobe, new compared with 2013 study. No significant surrounding mass effect. Periventricular  white matter hypodensities consistent with small vessel disease. Old appearing lacunar infarcts within the bilateral the thalamus. Old small infarct in the right cerebellar hemisphere. Vascular: No hyperdense vessels.  Mild calcifications within the carotid arteries at the skullbase. Skull: No fracture. Minimal opacifications of the inferior mastoids. Sinuses/Orbits: Mild mucosal thickening in paranasal sinuses. No acute orbital abnormality. Other: None IMPRESSION: 1. No acute hemorrhage or focal mass lesion visualized. 2. Evidence of old large right-sided hemispheric infarct. 3. Focal hypodense area within the left parasagittal occipital lobe, new since 2013, likely old given absence of significant surrounding mass effect, however MRI could be obtained to confirm that this does not represent subacute ischemic change. 4. Old lacunar infarcts within the thalamus. Old right cerebellar infarct. Electronically Signed   By: Jasmine Pang M.D.   On: 03/13/2016 20:23   Ct Angio Neck W Or Wo Contrast  Result Date: 03/16/2016 CLINICAL DATA:  Stroke.  Left PCA infarct. EXAM: CT ANGIOGRAPHY HEAD AND NECK TECHNIQUE: Multidetector CT imaging of the head and neck was performed using the standard protocol during bolus administration of intravenous contrast. Multiplanar CT image reconstructions and MIPs were obtained to evaluate the vascular anatomy. Carotid stenosis measurements (when applicable) are obtained utilizing NASCET criteria, using the distal internal carotid diameter as the denominator. CONTRAST:  50 mL IV Isovue 370 COMPARISON:  Brain MRI 03/14/2016, CTA 03/17/2012 FINDINGS: CT HEAD FINDINGS Brain: There is encephalomalacia and within the right frontal, parietal and occipital lobes, compatible with remote infarcts. There is hypoattenuation in the left PCA distribution that is consistent with the known acute left PCA infarct. There is no midline shift or significant mass effect. Prominence of the ventricles, greater than expected for age, persist. There is no intracranial hemorrhage. Vascular: No hyperdense vessel or unexpected calcification. Skull: Normal visualized skull base, calvarium and extracranial soft tissues.  Sinuses/Orbits: No sinus fluid levels or advanced mucosal thickening. No mastoid effusion. Normal orbits. Review of the MIP images confirms the above findings CTA NECK FINDINGS Aortic arch: There is aortic atherosclerosis. There is E centric thrombus within the anterior aspect of the left subclavian artery (series 502, image 48). The right subclavian artery is normal. There is a linear filling defect within the distal aspect of the ascending thoracic aorta (image 23). Right carotid system: There is E eccentric noncalcified plaque within the proximal right internal carotid artery. This results in severe stenosis, measuring approximately 80-90%. This has worsened compared to the prior study of 03/17/2012. There is a web-like component of the plaque extending superiorly that is also increased from the prior exam. There is no focal stenosis of the remainder of the cervical right ICA. Left carotid system: There is partially calcified atherosclerotic disease of the left carotid bifurcation without hemodynamically significant stenosis. Vertebral arteries: Both vertebral artery origins are widely patent. The vertebral system is left dominant. No focal stenosis, aneurysm or dissection of the vertebral arteries to the level of the basilar. The diminutive right vertebral artery terminates in the posterior inferior cerebellar artery. Skeleton: Negative Other neck: Negative Upper chest: Unremarkable. Review of the MIP images confirms the above findings CTA HEAD FINDINGS Anterior circulation: --Intracranial internal carotid arteries: Mild bilateral calcification of the proximal intracranial internal carotid arteries without hemodynamically significant stenosis. --Anterior cerebral arteries: Hypoplastic right A1 segment is a congenital variant. Otherwise normal. --Middle cerebral arteries: There is mild narrowing of the mid M1 segment of the left MCA. The right MCA is normal. --Posterior communicating arteries: Small P-comm is  present on the right.  Not clearly seen on the left. Posterior circulation: --Posterior cerebral arteries: Normal. --Superior cerebellar arteries: Normal. --Basilar artery: Normal. --Anterior inferior cerebellar arteries: Not visualized, which is not uncommon. --Posterior inferior cerebellar arteries: The right vertebral artery terminates in the posterior inferior cerebellar artery. Normal left PICA. Venous sinuses: As permitted by contrast timing, patent. Anatomic variants: Normal congenital variant hypoplastic right A1 segment. Delayed phase: No abnormal intracranial enhancement. Review of the MIP images confirms the above findings IMPRESSION: 1. Re- demonstration of findings of acute left PCA infarct. Chronic extensive right hemispheric encephalomalacia. 2. No intracranial arterial occlusion. 3. Severe stenosis of the proximal right intracranial carotid artery, measuring 80 and 90%, worsened from the prior study. This is secondary to large volume non calcified, eccentric atherosclerotic plaque. 4. Large amount of plaque vs chronic thrombus within the proximal left subclavian artery, slightly worsened compared to 03/17/2012. 5. Linear filling defect within the aortic arch is decreased compared to 03/17/2012. Electronically Signed   By: Deatra Robinson M.D.   On: 03/16/2016 04:32   Mr Brain Wo Contrast  Result Date: 03/14/2016 CLINICAL DATA:  Acute onset visual loss.  Prior stroke. EXAM: MRI HEAD WITHOUT CONTRAST MRA HEAD WITHOUT CONTRAST TECHNIQUE: Multiplanar, multiecho pulse sequences of the brain and surrounding structures were obtained without intravenous contrast. Angiographic images of the head were obtained using MRA technique without contrast. COMPARISON:  Head CT 03/13/2016 and CTA 03/17/2012 FINDINGS: MRI HEAD FINDINGS The examination had to be discontinued prior to completion due to patient claustrophobia. Axial and coronal diffusion, sagittal T1, and axial T2* gradient echo sequences were obtained.  There is a 5 cm acute infarct in the left PCA territory involving the medial occipital lobe. A focus of microhemorrhage is noted near the posterior superior aspect of the infarct. A large chronic right cerebral hemispheric infarct is again seen with associated chronic blood products and ex vacuo enlargement of the right lateral ventricle. There are also small chronic infarcts in the thalami and cerebellum bilaterally. There is moderate cerebral atrophy. No significant mass effect or evidence of extra-axial fluid collection on this limited study. MRA HEAD FINDINGS The study is moderately to severely motion degraded. The visualized distal left vertebral artery is patent. No flow related enhancement is seen in the distal right vertebral artery, which was patent but hypoplastic on the prior CTA. The basilar artery is patent without evidence of significant stenosis. The P1 segments and proximal left P2 are grossly patent. Signal dropout at the level of the proximal right P2 segment may reflect a high-grade stenosis or segmental occlusion, with evaluation severely limited by motion artifact through this level. The internal carotid arteries are patent from skullbase to carotid termini. Cavernous segment scratch sec cavernous and supraclinoid segment evaluation is limited by motion artifact. The M1 segments and left A1 segment are grossly patent. The right A1 segment was patent on the prior CTA but is not identified on this examination. Anterior circulation branch vessel evaluation is severely limited by motion artifact, although there is a decreased number of right MCA branch vessels corresponding to the large chronic infarct. IMPRESSION: 1. Incomplete MRI.  Acute left occipital lobe PCA infarct. 2. Large chronic right cerebral hemispheric infarct. 3. Motion degraded head MRA. P1 and proximal left P2 segments are grossly patent. Suspected right P2 occlusion or high-grade stenosis. 4. Suspected interval distal right  vertebral artery occlusion. Electronically Signed   By: Sebastian Ache M.D.   On: 03/14/2016 13:47   Mr Maxine Glenn Head/brain BJ Cm  Result Date:  03/14/2016 CLINICAL DATA:  Acute onset visual loss.  Prior stroke. EXAM: MRI HEAD WITHOUT CONTRAST MRA HEAD WITHOUT CONTRAST TECHNIQUE: Multiplanar, multiecho pulse sequences of the brain and surrounding structures were obtained without intravenous contrast. Angiographic images of the head were obtained using MRA technique without contrast. COMPARISON:  Head CT 03/13/2016 and CTA 03/17/2012 FINDINGS: MRI HEAD FINDINGS The examination had to be discontinued prior to completion due to patient claustrophobia. Axial and coronal diffusion, sagittal T1, and axial T2* gradient echo sequences were obtained. There is a 5 cm acute infarct in the left PCA territory involving the medial occipital lobe. A focus of microhemorrhage is noted near the posterior superior aspect of the infarct. A large chronic right cerebral hemispheric infarct is again seen with associated chronic blood products and ex vacuo enlargement of the right lateral ventricle. There are also small chronic infarcts in the thalami and cerebellum bilaterally. There is moderate cerebral atrophy. No significant mass effect or evidence of extra-axial fluid collection on this limited study. MRA HEAD FINDINGS The study is moderately to severely motion degraded. The visualized distal left vertebral artery is patent. No flow related enhancement is seen in the distal right vertebral artery, which was patent but hypoplastic on the prior CTA. The basilar artery is patent without evidence of significant stenosis. The P1 segments and proximal left P2 are grossly patent. Signal dropout at the level of the proximal right P2 segment may reflect a high-grade stenosis or segmental occlusion, with evaluation severely limited by motion artifact through this level. The internal carotid arteries are patent from skullbase to carotid termini.  Cavernous segment scratch sec cavernous and supraclinoid segment evaluation is limited by motion artifact. The M1 segments and left A1 segment are grossly patent. The right A1 segment was patent on the prior CTA but is not identified on this examination. Anterior circulation branch vessel evaluation is severely limited by motion artifact, although there is a decreased number of right MCA branch vessels corresponding to the large chronic infarct. IMPRESSION: 1. Incomplete MRI.  Acute left occipital lobe PCA infarct. 2. Large chronic right cerebral hemispheric infarct. 3. Motion degraded head MRA. P1 and proximal left P2 segments are grossly patent. Suspected right P2 occlusion or high-grade stenosis. 4. Suspected interval distal right vertebral artery occlusion. Electronically Signed   By: Sebastian AcheAllen  Grady M.D.   On: 03/14/2016 13:47     PERTINENT LAB RESULTS: CBC:  Recent Labs  03/15/16 0518  WBC 11.7*  HGB 13.8  HCT 40.2  PLT 199   CMET CMP     Component Value Date/Time   NA 136 03/15/2016 0518   K 4.1 03/15/2016 0518   CL 103 03/15/2016 0518   CO2 24 03/15/2016 0518   GLUCOSE 143 (H) 03/15/2016 0518   BUN 18 03/15/2016 0518   CREATININE 1.46 (H) 03/15/2016 0518   CALCIUM 9.3 03/15/2016 0518   PROT 8.4 (H) 03/13/2016 1931   ALBUMIN 4.2 03/13/2016 1931   AST 23 03/13/2016 1931   ALT 33 03/13/2016 1931   ALKPHOS 81 03/13/2016 1931   BILITOT 0.5 03/13/2016 1931   GFRNONAA 48 (L) 03/15/2016 0518   GFRAA 56 (L) 03/15/2016 0518    GFR Estimated Creatinine Clearance: 67.9 mL/min (by C-G formula based on SCr of 1.46 mg/dL (H)). No results for input(s): LIPASE, AMYLASE in the last 72 hours. No results for input(s): CKTOTAL, CKMB, CKMBINDEX, TROPONINI in the last 72 hours. Invalid input(s): POCBNP No results for input(s): DDIMER in the last 72 hours. No results  for input(s): HGBA1C in the last 72 hours. No results for input(s): CHOL, HDL, LDLCALC, TRIG, CHOLHDL, LDLDIRECT in the last  72 hours. No results for input(s): TSH, T4TOTAL, T3FREE, THYROIDAB in the last 72 hours.  Invalid input(s): FREET3 No results for input(s): VITAMINB12, FOLATE, FERRITIN, TIBC, IRON, RETICCTPCT in the last 72 hours. Coags: No results for input(s): INR in the last 72 hours.  Invalid input(s): PT Microbiology: No results found for this or any previous visit (from the past 240 hour(s)).  FURTHER DISCHARGE INSTRUCTIONS:  Get Medicines reviewed and adjusted: Please take all your medications with you for your next visit with your Primary MD  Laboratory/radiological data: Please request your Primary MD to go over all hospital tests and procedure/radiological results at the follow up, please ask your Primary MD to get all Hospital records sent to his/her office.  In some cases, they will be blood work, cultures and biopsy results pending at the time of your discharge. Please request that your primary care M.D. goes through all the records of your hospital data and follows up on these results.  Also Note the following: If you experience worsening of your admission symptoms, develop shortness of breath, life threatening emergency, suicidal or homicidal thoughts you must seek medical attention immediately by calling 911 or calling your MD immediately  if symptoms less severe.  You must read complete instructions/literature along with all the possible adverse reactions/side effects for all the Medicines you take and that have been prescribed to you. Take any new Medicines after you have completely understood and accpet all the possible adverse reactions/side effects.   Do not drive when taking Pain medications or sleeping medications (Benzodaizepines)  Do not take more than prescribed Pain, Sleep and Anxiety Medications. It is not advisable to combine anxiety,sleep and pain medications without talking with your primary care practitioner  Special Instructions: If you have smoked or chewed Tobacco   in the last 2 yrs please stop smoking, stop any regular Alcohol  and or any Recreational drug use.  Wear Seat belts while driving.  Please note: You were cared for by a hospitalist during your hospital stay. Once you are discharged, your primary care physician will handle any further medical issues. Please note that NO REFILLS for any discharge medications will be authorized once you are discharged, as it is imperative that you return to your primary care physician (or establish a relationship with a primary care physician if you do not have one) for your post hospital discharge needs so that they can reassess your need for medications and monitor your lab values.  Total Time spent coordinating discharge including counseling, education and face to face time equals 45 minutes.  SignedJeoffrey Massed 03/17/2016 9:22 AM

## 2016-03-18 LAB — URINE CULTURE

## 2016-03-20 DIAGNOSIS — Z7901 Long term (current) use of anticoagulants: Secondary | ICD-10-CM | POA: Diagnosis not present

## 2016-03-20 DIAGNOSIS — I639 Cerebral infarction, unspecified: Secondary | ICD-10-CM | POA: Diagnosis not present

## 2016-03-24 DIAGNOSIS — Z09 Encounter for follow-up examination after completed treatment for conditions other than malignant neoplasm: Secondary | ICD-10-CM | POA: Diagnosis not present

## 2016-03-24 DIAGNOSIS — Z6837 Body mass index (BMI) 37.0-37.9, adult: Secondary | ICD-10-CM | POA: Diagnosis not present

## 2016-03-24 DIAGNOSIS — M79673 Pain in unspecified foot: Secondary | ICD-10-CM | POA: Diagnosis not present

## 2016-03-24 DIAGNOSIS — E785 Hyperlipidemia, unspecified: Secondary | ICD-10-CM | POA: Diagnosis not present

## 2016-03-24 DIAGNOSIS — E119 Type 2 diabetes mellitus without complications: Secondary | ICD-10-CM | POA: Diagnosis not present

## 2016-03-24 DIAGNOSIS — I639 Cerebral infarction, unspecified: Secondary | ICD-10-CM | POA: Diagnosis not present

## 2016-03-24 DIAGNOSIS — R944 Abnormal results of kidney function studies: Secondary | ICD-10-CM | POA: Diagnosis not present

## 2016-03-24 DIAGNOSIS — N390 Urinary tract infection, site not specified: Secondary | ICD-10-CM | POA: Diagnosis not present

## 2016-03-24 DIAGNOSIS — E782 Mixed hyperlipidemia: Secondary | ICD-10-CM | POA: Diagnosis not present

## 2016-03-24 DIAGNOSIS — R339 Retention of urine, unspecified: Secondary | ICD-10-CM | POA: Diagnosis not present

## 2016-03-25 ENCOUNTER — Telehealth: Payer: Self-pay | Admitting: Neurology

## 2016-03-25 ENCOUNTER — Encounter (HOSPITAL_COMMUNITY): Payer: Self-pay | Admitting: *Deleted

## 2016-03-25 ENCOUNTER — Telehealth (HOSPITAL_COMMUNITY): Payer: Self-pay | Admitting: Specialist

## 2016-03-25 DIAGNOSIS — Z79899 Other long term (current) drug therapy: Secondary | ICD-10-CM | POA: Diagnosis not present

## 2016-03-25 DIAGNOSIS — I10 Essential (primary) hypertension: Secondary | ICD-10-CM | POA: Diagnosis not present

## 2016-03-25 DIAGNOSIS — E119 Type 2 diabetes mellitus without complications: Secondary | ICD-10-CM | POA: Insufficient documentation

## 2016-03-25 DIAGNOSIS — Z7984 Long term (current) use of oral hypoglycemic drugs: Secondary | ICD-10-CM | POA: Insufficient documentation

## 2016-03-25 DIAGNOSIS — N390 Urinary tract infection, site not specified: Secondary | ICD-10-CM | POA: Insufficient documentation

## 2016-03-25 DIAGNOSIS — I2510 Atherosclerotic heart disease of native coronary artery without angina pectoris: Secondary | ICD-10-CM | POA: Diagnosis not present

## 2016-03-25 DIAGNOSIS — R319 Hematuria, unspecified: Secondary | ICD-10-CM | POA: Diagnosis not present

## 2016-03-25 DIAGNOSIS — Z7901 Long term (current) use of anticoagulants: Secondary | ICD-10-CM | POA: Insufficient documentation

## 2016-03-25 DIAGNOSIS — Z87891 Personal history of nicotine dependence: Secondary | ICD-10-CM | POA: Insufficient documentation

## 2016-03-25 DIAGNOSIS — Z8673 Personal history of transient ischemic attack (TIA), and cerebral infarction without residual deficits: Secondary | ICD-10-CM | POA: Insufficient documentation

## 2016-03-25 DIAGNOSIS — Z7982 Long term (current) use of aspirin: Secondary | ICD-10-CM | POA: Insufficient documentation

## 2016-03-25 LAB — CBC WITH DIFFERENTIAL/PLATELET
BASOS ABS: 0.1 10*3/uL (ref 0.0–0.1)
Basophils Relative: 1 %
Eosinophils Absolute: 0.3 10*3/uL (ref 0.0–0.7)
Eosinophils Relative: 2 %
HEMATOCRIT: 40.6 % (ref 39.0–52.0)
HEMOGLOBIN: 13.7 g/dL (ref 13.0–17.0)
LYMPHS ABS: 3.3 10*3/uL (ref 0.7–4.0)
LYMPHS PCT: 22 %
MCH: 29.6 pg (ref 26.0–34.0)
MCHC: 33.7 g/dL (ref 30.0–36.0)
MCV: 87.7 fL (ref 78.0–100.0)
Monocytes Absolute: 1.2 10*3/uL — ABNORMAL HIGH (ref 0.1–1.0)
Monocytes Relative: 8 %
NEUTROS ABS: 9.9 10*3/uL — AB (ref 1.7–7.7)
NEUTROS PCT: 67 %
Platelets: 293 10*3/uL (ref 150–400)
RBC: 4.63 MIL/uL (ref 4.22–5.81)
RDW: 13.9 % (ref 11.5–15.5)
WBC: 14.8 10*3/uL — AB (ref 4.0–10.5)

## 2016-03-25 LAB — BASIC METABOLIC PANEL
ANION GAP: 9 (ref 5–15)
BUN: 21 mg/dL — ABNORMAL HIGH (ref 6–20)
CALCIUM: 9.3 mg/dL (ref 8.9–10.3)
CO2: 22 mmol/L (ref 22–32)
Chloride: 101 mmol/L (ref 101–111)
Creatinine, Ser: 1.44 mg/dL — ABNORMAL HIGH (ref 0.61–1.24)
GFR calc non Af Amer: 49 mL/min — ABNORMAL LOW (ref >60)
GFR, EST AFRICAN AMERICAN: 57 mL/min — AB (ref >60)
GLUCOSE: 130 mg/dL — AB (ref 65–99)
POTASSIUM: 4 mmol/L (ref 3.5–5.1)
Sodium: 132 mmol/L — ABNORMAL LOW (ref 135–145)

## 2016-03-25 LAB — PROTIME-INR
INR: 2.43
PROTHROMBIN TIME: 26.9 s — AB (ref 11.4–15.2)

## 2016-03-25 LAB — APTT: aPTT: 58 seconds — ABNORMAL HIGH (ref 24–36)

## 2016-03-25 NOTE — Telephone Encounter (Signed)
Courtney/ NP at pcp office. She would like to know if the pt can use Eliquise. She was told by the wife the pt is not a candidate. Toni AmendCourtney would like to discuss with the physician when available.

## 2016-03-25 NOTE — ED Triage Notes (Signed)
Pt is on coumadin and his INR was 4 yesterday, the nurse told him to skip yesterday's dose and pt has not had a dose today

## 2016-03-25 NOTE — Telephone Encounter (Signed)
LFt vm for Shaun Pugh at PCP office about coumadin. Pt was d/c from hospital last week. 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 Shaun Pugh is not in the office this week. Rn stated a message will be sent to Shaun Pugh.

## 2016-03-25 NOTE — Telephone Encounter (Signed)
Positive CVA  needs occupational therapy evaluation and treatment order is only attached to patient's chart. Wife called to schedule apptment and [facility] Dr. Biagio BorgJin Dong Xu. Order has been scanned in epic. NF

## 2016-03-25 NOTE — ED Triage Notes (Signed)
Pt c/o blood in urine; pt denies any abdominal pain

## 2016-03-25 NOTE — Telephone Encounter (Signed)
NP, Roe Rutherfordourtney Keatts [phone] or [phone] called to discuss patient's medication warfarin (COUMADIN) 5 MG tablet, please call.

## 2016-03-26 ENCOUNTER — Emergency Department (HOSPITAL_COMMUNITY)
Admission: EM | Admit: 2016-03-26 | Discharge: 2016-03-26 | Disposition: A | Discharge status: Home / Self Care | Payer: Medicare Other | Attending: Emergency Medicine | Admitting: Emergency Medicine

## 2016-03-26 DIAGNOSIS — N390 Urinary tract infection, site not specified: Secondary | ICD-10-CM

## 2016-03-26 DIAGNOSIS — R319 Hematuria, unspecified: Secondary | ICD-10-CM

## 2016-03-26 DIAGNOSIS — T45515A Adverse effect of anticoagulants, initial encounter: Secondary | ICD-10-CM

## 2016-03-26 DIAGNOSIS — R58 Hemorrhage, not elsewhere classified: Secondary | ICD-10-CM

## 2016-03-26 LAB — URINE MICROSCOPIC-ADD ON

## 2016-03-26 LAB — URINALYSIS, ROUTINE W REFLEX MICROSCOPIC
GLUCOSE, UA: NEGATIVE mg/dL
Nitrite: POSITIVE — AB
PH: 5.5 (ref 5.0–8.0)
Protein, ur: 100 mg/dL — AB
SPECIFIC GRAVITY, URINE: 1.025 (ref 1.005–1.030)

## 2016-03-26 MED ORDER — CEPHALEXIN 500 MG PO CAPS: 500.0000 mg | ORAL_CAPSULE | Freq: Three times a day (TID) | ORAL | 0 refills | Status: DC

## 2016-03-26 MED ORDER — CEPHALEXIN 500 MG PO CAPS
500.0000 mg | ORAL_CAPSULE | Freq: Once | ORAL | Status: AC
  Administered 2016-03-26: 500 mg via ORAL
  Filled 2016-03-26: qty 1

## 2016-03-26 NOTE — Telephone Encounter (Signed)
Pt recent stroke at left PCA was considered likely due to left subclavian thrombus. Although pt had left subclavian thrombus in 2013 but this time it seemed slightly worsened and likely correlating to his stroke. Therefore, we started on coumadin. Eliquis has no indication for thrombus, so he can not be changed to eliquis unless he has DVT or nonvalvular afib.   He went to ED yesterday and today for hematuria. However, test showed his INR was high yesterday at 4.1 as well as UTI. He did have imcompleted treated UTI since last admission. He is now on antibiotics. I am wondering whether the hematuria is due to UTI in the setting of high INR.   I am Ok to stop coumadin for now to allow his hematuria to heal. During the meantime without coumadin, he can take ASA instead if not contraindicated. But I would like to try it again once his UTI and hematuria resolved. If he still not able to tolerate coumadin with therapeutic INR, we can just put him back on ASA and plavix for stroke prevention, but he has to know that he has higher risk for recurrent stroke. Thank you.   Marvel PlanJindong Lorelle Macaluso, MD PhD Stroke Neurology 03/26/2016 10:54 AM

## 2016-03-26 NOTE — Telephone Encounter (Addendum)
Rn call Roe Rutherfordourtney Keatts NP at patients PCP office. Toni AmendCourtney stated she was off work today. Rn stated a note from Dr. Roda ShuttersXu can be fax to her office. Toni AmendCourtney stated she has access to epic and can see telephone notes, and other office notes. RN requested fax number just in case she cannot see notes. Fax number was given of [phone]. Note was fax twice and receive.Courtney verbalized understanding.

## 2016-03-26 NOTE — Discharge Instructions (Signed)
Your urine tonight although has a lot of blood in it also has a lot of infection cells. You recently had a urinary tract infection on November 6. You were placed back on antibiotics for at least 10 days. Please contact Dr. Scharlene GlossHall's office in the morning to discuss with him if you should remain on the Coumadin. You will need to follow-up with urology to look for a source of the bleeding such as a tumor in your bladder or other pathology that could cause the bleeding. Return to the ED if you get a fever, vomiting, abdominal pain, are unable to urinate, get weak or dizzy or feel like you are going to pass out.

## 2016-03-26 NOTE — ED Provider Notes (Signed)
[facility] Provider Note   CSN: [id number] Arrival date & time: 03/25/16  1949  By signing my name below, I, Christy SartoriusAnastasia Kolousek, attest that this documentation has been prepared under the direction and in the presence of Devoria AlbeIva Myrical Andujo, MD . Electronically Signed: Christy SartoriusAnastasia Kolousek, Scribe. 03/26/2016. 1:08 AM.  Time seen 12:48 AM  History   Chief Complaint Chief Complaint  Patient presents with  . Hematuria   The history is provided by medical records, the patient and the spouse. No language interpreter was used.     HPI Comments:  Shaun Pugh is a 65 y.o. male with Hx of stroke and kidney stones who presents to the Emergency Department complaining of hematuria onset yesterday afternoon, Nov 15.  He reports 2 episodes of hematuria.  Pt had a stroke 1 week ago and was started on coumadin.  Yesterday pt was checked at his PCP office and his INR was 4.1.  He was told not to take coumadin yesterday and to take one today (he was taking 1 1/2 tabs of the 5 mg); he has not taken his medication today.  He notes that his stroke temporarily blinded him but his vision is returning with some black spots still present in his field of vision.  Pt reports that he had hematuria once in the past after a surgical kidney stone removal.  He also note that following the surgery he has only been able to pee sitting down.  He denies abdominal pain and blood in stool.  He denies feeling bad. His wife noticed the blood when helping him get off the toilet.   PCP Dr Hughie ClossZ Hall His neurologist is Dr. Roda ShuttersXu.   His urologist who performed the surgery was Dr. Marlou PorchHerrick.      Past Medical History:  Diagnosis Date  . Acute pancreatitis 09/14/2011  . Atrophy of right kidney 09/15/2011  . CAD (coronary artery disease)    HX OF TWO HEART STENTS PLACED APPROX 10 OR 11 YRS AGO- STATES RELEASED BY CARDIOLOGIST- NO LONGER SEES  . Chronic back pain   . Diabetes (HCC)   . High cholesterol   . Hypertension   . Kidney calculi  09/15/2011  . Myocardial infarction   . Seizures (HCC)    AFTER THE STROKE - BUT NONE SINCE  . Sleep apnea    STATES TRIED CPAP - DID NOT TOLERATE - RETURNED CPAP - DOES NOT HAVE ANYMORE  . Stented coronary artery approx 2004  . Stroke (HCC) 03/18/2012   LEFT SIDED WEAKNESS - STILL HAS SOME LUE>LLE WEAKNESS - ABLE TO AMBULATE WITH OR WITHOUT CANE BUT DOES NOT HAVE USE OF LEFT ARM  . TIA (transient ischemic attack)     Patient Active Problem List   Diagnosis Date Noted  . Blurry vision, bilateral   . CVA (cerebral vascular accident) (HCC) 03/13/2016  . Diabetes (HCC) 03/13/2016  . Essential hypertension 03/13/2016  . Hyperlipidemia 03/13/2016  . Muscle weakness (generalized) 05/31/2012  . Difficulty walking 05/31/2012  . Lack of coordination 05/31/2012  . Cytotoxic cerebral edema (HCC) 03/19/2012  . Aortic thromboembolism (HCC) 03/19/2012  . Stroke, acute, embolic, right 03/18/2012  . Seizure (HCC) 03/18/2012  . Stented coronary artery   . Hemiplegia affecting nondominant side (HCC) 03/17/2012  . Atrophy of right kidney 09/15/2011  . Kidney calculi 09/15/2011  . Acute pancreatitis 09/14/2011  . Cholelithiasis 09/14/2011  . Hyperglycemia 09/14/2011    Past Surgical History:  Procedure Laterality Date  . BACK SURGERY  LUMBAR SURGERIES x 4 - INCLUDING FUSIONS  . CARPAL TUNNEL REPAIR Left   . CHOLECYSTECTOMY  09/25/2011   Procedure: LAPAROSCOPIC CHOLECYSTECTOMY;  Surgeon: Fabio Bering, MD;  Location: [facility];  Service: General;  Laterality: N/A;  . CYSTOSCOPY WITH RETROGRADE PYELOGRAM, URETEROSCOPY AND STENT PLACEMENT Right 05/26/2013   Procedure: CYSTOSCOPY, RIGHT RETROGRADE PYELOGRAM, RIGHT URETEROSCOPY AND RIGHT STENT PLACEMENT, stone extraction, biopsy;  Surgeon: Crist Fat, MD;  Location: [facility];  Service: Urology;  Laterality: Right;  . HERNIA REPAIR     INGUINAL HERNIA REPARI MAYBE 30 YRS AGO  . MULTIPLE TOOTH EXTRACTIONS         Home Medications     Prior to Admission medications   Medication Sig Start Date End Date Taking? Authorizing Provider  aspirin 325 MG tablet Take 1 tablet (325 mg total) by mouth daily. Stop aspirin when your INR level is more than 2 03/17/16   Shanker Levora Dredge, MD  calcium-vitamin D (OSCAL WITH D) 500-200 MG-UNIT tablet Take 1 tablet by mouth daily with breakfast.    Historical Provider, MD  cephALEXin (KEFLEX) 500 MG capsule Take 1 capsule (500 mg total) by mouth 3 (three) times daily. 03/26/16   Devoria Albe, MD  clobetasol cream (TEMOVATE) 0.05 % Apply 1 application topically 2 (two) times daily.    Historical Provider, MD  escitalopram (LEXAPRO) 5 MG tablet Take 1 tablet by mouth daily. 03/11/16   Historical Provider, MD  fluticasone (CUTIVATE) 0.05 % cream Apply topically 2 (two) times daily.    Historical Provider, MD  glipiZIDE (GLUCOTROL XL) 10 MG 24 hr tablet Take 10 mg by mouth daily with breakfast.    Historical Provider, MD  lisinopril-hydrochlorothiazide (PRINZIDE,ZESTORETIC) 10-12.5 MG tablet Take 1 tablet by mouth daily. 02/18/16   Historical Provider, MD  metFORMIN (GLUCOPHAGE) 500 MG tablet Take 500 mg by mouth 2 (two) times daily with a meal.    Historical Provider, MD  Omega-3 Fatty Acids (FISH OIL) 1000 MG CAPS Take 1 capsule by mouth daily.    Historical Provider, MD  pantoprazole (PROTONIX) 40 MG tablet Take 1 tablet by mouth daily. 03/11/16   Historical Provider, MD  vitamin B-12 (CYANOCOBALAMIN) 1000 MCG tablet Take 1,000 mcg by mouth daily.    Historical Provider, MD  warfarin (COUMADIN) 5 MG tablet Take 1.5 tablets (7.5 mg total) by mouth daily at 6 PM. 03/17/16   Maretta Bees, MD    Family History Family History  Problem Relation Age of Onset  . Congestive Heart Failure Mother 22  . Diabetes Father 62  . Heart attack Father   . Anesthesia problems Neg Hx   . Hypotension Neg Hx   . Malignant hyperthermia Neg Hx   . Pseudochol deficiency Neg Hx     Social History Social History   Substance Use Topics  . Smoking status: Former Smoker    Packs/day: 1.00    Years: 45.00    Types: Cigarettes    Quit date: 03/18/2012  . Smokeless tobacco: Never Used  . Alcohol use No  lives at home Lives with spouse   Allergies   Patient has no known allergies.   Review of Systems Review of Systems  Gastrointestinal: Negative for abdominal pain and blood in stool.  Genitourinary: Positive for hematuria.  All other systems reviewed and are negative.    Physical Exam Updated Vital Signs BP 143/79 (BP Location: Left Arm)   Pulse 91   Temp 98 F (36.7 C)   Resp 18  Ht 6\' 1"  (1.854 m)   Wt 267 lb (121.1 kg)   SpO2 100%   BMI 35.23 kg/m   Vital signs normal    Physical Exam  Constitutional: He is oriented to person, place, and time. He appears well-developed and well-nourished.  Non-toxic appearance. He does not appear ill. No distress.  HENT:  Head: Normocephalic and atraumatic.  Right Ear: External ear normal.  Left Ear: External ear normal.  Nose: Nose normal. No mucosal edema or rhinorrhea.  Mouth/Throat: Oropharynx is clear and moist and mucous membranes are normal. No dental abscesses or uvula swelling.  Eyes: Conjunctivae and EOM are normal. Pupils are equal, round, and reactive to light.  Neck: Normal range of motion and full passive range of motion without pain. Neck supple.  Cardiovascular: Normal rate, regular rhythm and normal heart sounds.  Exam reveals no gallop and no friction rub.   No murmur heard. Pulmonary/Chest: Effort normal and breath sounds normal. No respiratory distress. He has no wheezes. He has no rhonchi. He has no rales. He exhibits no tenderness and no crepitus.  Abdominal: Soft. Normal appearance and bowel sounds are normal. He exhibits no distension. There is no tenderness. There is no rebound and no guarding.  Musculoskeletal: Normal range of motion. He exhibits no edema or tenderness.  Moves all extremities well.   Neurological:  He is alert and oriented to person, place, and time. He has normal strength. No cranial nerve deficit.  Skin: Skin is warm, dry and intact. No rash noted. No erythema. No pallor.  Psychiatric: He has a normal mood and affect. His speech is normal and behavior is normal. His mood appears not anxious.  Nursing note and vitals reviewed.    ED Treatments / Results   DIAGNOSTIC STUDIES:  Oxygen Saturation is 100% on RA, NML by my interpretation.     Labs (all labs ordered are listed, but only abnormal results are displayed) Results for orders placed or performed during the hospital encounter of 03/26/16  Urinalysis, Routine w reflex microscopic (not at [facility])  Result Value Ref Range   Color, Urine BROWN (A) YELLOW   APPearance CLOUDY (A) CLEAR   Specific Gravity, Urine 1.025 1.005 - 1.030   pH 5.5 5.0 - 8.0   Glucose, UA NEGATIVE NEGATIVE mg/dL   Hgb urine dipstick LARGE (A) NEGATIVE   Bilirubin Urine SMALL (A) NEGATIVE   Ketones, ur TRACE (A) NEGATIVE mg/dL   Protein, ur 098 (A) NEGATIVE mg/dL   Nitrite POSITIVE (A) NEGATIVE   Leukocytes, UA MODERATE (A) NEGATIVE  CBC with Differential  Result Value Ref Range   WBC 14.8 (H) 4.0 - 10.5 K/uL   RBC 4.63 4.22 - 5.81 MIL/uL   Hemoglobin 13.7 13.0 - 17.0 g/dL   HCT 11.9 14.7 - 82.9 %   MCV 87.7 78.0 - 100.0 fL   MCH 29.6 26.0 - 34.0 pg   MCHC 33.7 30.0 - 36.0 g/dL   RDW 56.2 13.0 - 86.5 %   Platelets 293 150 - 400 K/uL   Neutrophils Relative % 67 %   Neutro Abs 9.9 (H) 1.7 - 7.7 K/uL   Lymphocytes Relative 22 %   Lymphs Abs 3.3 0.7 - 4.0 K/uL   Monocytes Relative 8 %   Monocytes Absolute 1.2 (H) 0.1 - 1.0 K/uL   Eosinophils Relative 2 %   Eosinophils Absolute 0.3 0.0 - 0.7 K/uL   Basophils Relative 1 %   Basophils Absolute 0.1 0.0 - 0.1 K/uL   WBC  Morphology ATYPICAL LYMPHOCYTES   Basic metabolic panel  Result Value Ref Range   Sodium 132 (L) 135 - 145 mmol/L   Potassium 4.0 3.5 - 5.1 mmol/L   Chloride 101 101 - 111  mmol/L   CO2 22 22 - 32 mmol/L   Glucose, Bld 130 (H) 65 - 99 mg/dL   BUN 21 (H) 6 - 20 mg/dL   Creatinine, Ser 5.40 (H) 0.61 - 1.24 mg/dL   Calcium 9.3 8.9 - 98.1 mg/dL   GFR calc non Af Amer 49 (L) >60 mL/min   GFR calc Af Amer 57 (L) >60 mL/min   Anion gap 9 5 - 15  APTT  Result Value Ref Range   aPTT 58 (H) 24 - 36 seconds  Protime-INR  Result Value Ref Range   Prothrombin Time 26.9 (H) 11.4 - 15.2 seconds   INR 2.43   Urine microscopic-add on  Result Value Ref Range   Squamous Epithelial / LPF 0-5 (A) NONE SEEN   WBC, UA TOO NUMEROUS TO COUNT 0 - 5 WBC/hpf   RBC / HPF TOO NUMEROUS TO COUNT 0 - 5 RBC/hpf   Bacteria, UA MANY (A) NONE SEEN   Casts GRANULAR CAST (A) NEGATIVE      EKG  EKG Interpretation None       Radiology No results found.   Ct Angio Head W Or Wo Contrast  Result Date: 03/16/2016 CLINICAL DATA:  Stroke.  Left PCA infarct.IMPRESSION: 1. Re- demonstration of findings of acute left PCA infarct. Chronic extensive right hemispheric encephalomalacia. 2. No intracranial arterial occlusion. 3. Severe stenosis of the proximal right intracranial carotid artery, measuring 80 and 90%, worsened from the prior study. This is secondary to large volume non calcified, eccentric atherosclerotic plaque. 4. Large amount of plaque vs chronic thrombus within the proximal left subclavian artery, slightly worsened compared to 03/17/2012. 5. Linear filling defect within the aortic arch is decreased compared to 03/17/2012. Electronically Signed   By: Deatra Robinson M.D.   On: 03/16/2016 04:32   Ct Head Wo Contrast  Result Date: 03/13/2016 CLINICAL DATA:  Headache IMPRESSION: 1. No acute hemorrhage or focal mass lesion visualized. 2. Evidence of old large right-sided hemispheric infarct. 3. Focal hypodense area within the left parasagittal occipital lobe, new since 2013, likely old given absence of significant surrounding mass effect, however MRI could be obtained to confirm that  this does not represent subacute ischemic change. 4. Old lacunar infarcts within the thalamus. Old right cerebellar infarct. Electronically Signed   By: Jasmine Pang M.D.   On: 03/13/2016 20:23   Ct Angio Neck W Or Wo Contrast  Result Date: 03/16/2016 CLINICAL DATA:  Stroke.  Left PCA infarct.  expected for age, persist. There is no intracranial hemorrhage. Vascular: No hyperdense vessel  IMPRESSION: 1. Re- demonstration of findings of acute left PCA infarct. Chronic extensive right hemispheric encephalomalacia. 2. No intracranial arterial occlusion. 3. Severe stenosis of the proximal right intracranial carotid artery, measuring 80 and 90%, worsened from the prior study. This is secondary to large volume non calcified, eccentric atherosclerotic plaque. 4. Large amount of plaque vs chronic thrombus within the proximal left subclavian artery, slightly worsened compared to 03/17/2012. 5. Linear filling defect within the aortic arch is decreased compared to 03/17/2012. Electronically Signed   By: Deatra Robinson M.D.   On: 03/16/2016 04:32   Mr Brain Wo Contrast  Result Date: 03/14/2016 CLINICAL DATA:  Acute onset visual loss.  Prior stroke IMPRESSION: 1. Incomplete MRI.  Acute left occipital lobe PCA infarct. 2. Large chronic right cerebral hemispheric infarct. 3. Motion degraded head MRA. P1 and proximal left P2 segments are grossly patent. Suspected right P2 occlusion or high-grade stenosis. 4. Suspected interval distal right vertebral artery occlusion. Electronically Signed   By: Allen  Grady M.D.   On: 03/14/2016 13:47   Mr Maxine GlennMra Head/brain ZSebastian AcheWo Cm  Result Date: 03/14/2016 CLINICAL DATA:  Acute onset visual loss.  Prior stroke.  IMPRESSION: 1. Incomplete MRI.  Acute left occipital lobe PCA infarct. 2. Large chronic right cerebral hemispheric infarct. 3. Motion degraded head MRA. P1 and proximal left P2 segments are grossly patent. Suspected right P2 occlusion or high-grade stenosis. 4. Suspected interval  distal right vertebral artery occlusion. Electronically Signed   By: Sebastian AcheAllen  Grady M.D.   On: 03/14/2016 13:47    Procedures Procedures (including critical care time)  Medications Ordered in ED Medications  cephALEXin (KEFLEX) capsule 500 mg (500 mg Oral Given 03/26/16 0122)     Initial Impression / Assessment and Plan / ED Course  I have reviewed the triage vital signs and the nursing notes.  Pertinent labs & imaging results that were available during my care of the patient were reviewed by me and considered in my medical decision making (see chart for details).  Clinical Course     COORDINATION OF CARE:  1:06 AM Reviewed results with pt. Still waiting for his urinalysis to result. Discussed treatment plan with pt at bedside and pt agreed to plan. We discussed he needs to talk to his PCP to decide if he needs to stay on the Coumadin or if he should go back on Plavix which he took after his first stroke. He states he had no bleeding difficulties while on the Plavix.  After reviewing patient's urinalysis I reviewed his prior laboratory testing and he did have a urine culture done on November 6 which grew out more than 100,000 colonies of Escherichia coli sensitive to all antibiotics tested. Since he is on the Coumadin he was started on Keflex which he was actually treated for in the hospital but only twice a day. He was placed on it 3 times a day for 10 days. We discussed he needs follow-up with urology they may need to do cystoscopy to find a lesion that could be causing the bleeding. However I am unable to make that decision tonight in the ED whether he should remain on the Coumadin, this needs to be decided by his primary care doctor. His INR is therapeutic tonight.    Final Clinical Impressions(s) / ED Diagnoses   Final diagnoses:  Hematuria, unspecified type  Bleeding on Coumadin  Urinary tract infection with hematuria, site unspecified    New Prescriptions New Prescriptions     CEPHALEXIN (KEFLEX) 500 MG CAPSULE    Take 1 capsule (500 mg total) by mouth 3 (three) times daily.   Plan discharge  Devoria AlbeIva Kanoelani Dobies, MD, FACEP  I personally performed the services described in this documentation, which was scribed in my presence. The recorded information has been reviewed and considered.  Devoria AlbeIva Chyler Creely, MD, Concha PyoFACEP    Roxanne Panek, MD 03/26/16 [phone]

## 2016-03-28 LAB — URINE CULTURE: Culture: >=100000 — AB

## 2016-03-29 ENCOUNTER — Telehealth (HOSPITAL_BASED_OUTPATIENT_CLINIC_OR_DEPARTMENT_OTHER): Payer: Self-pay

## 2016-03-29 NOTE — Telephone Encounter (Signed)
Post ED Visit - Positive Culture Follow-up  Culture report reviewed by antimicrobial stewardship pharmacist:  []  Shaun Pugh, Pharm.D. []  Shaun Pugh, Pharm.D., BCPS []  Shaun Pugh, Pharm.D. []  Shaun Pugh, Pharm.D., BCPS []  Shaun Pugh, [place]., BCPS, AAHIVP []  Shaun Pugh, Pharm.D., BCPS, AAHIVP []  Shaun Pugh, Pharm.D. []  Shaun Pugh, [address] Rainbow BoulevardPharm[address] Shaun Pugh Pharm D Positive urine culture Treated with Cephalexin, organism sensitive to the same and no further patient follow-up is required at this time.  Shaun Pugh, Shaun Pugh 03/29/2016, 9:54 AM

## 2016-03-30 ENCOUNTER — Encounter (HOSPITAL_COMMUNITY): Payer: Self-pay

## 2016-03-30 ENCOUNTER — Ambulatory Visit (HOSPITAL_COMMUNITY): Payer: Medicare Other | Attending: Neurology

## 2016-03-30 DIAGNOSIS — H5410 Blindness, one eye, low vision other eye, unspecified eyes: Secondary | ICD-10-CM

## 2016-03-31 NOTE — Therapy (Signed)
[facility] [address] [place], [place], [address] Phone: [phone]   Fax:  [phone]  Occupational Therapy Low Vision Evaluation  Patient Details  Name: Shaun Pugh MRN: [id number] Date of Birth: [birth year] Referring Provider: Dr. Marvel Plan   Encounter Date: 03/30/2016      OT End of Session - 03/30/16 1257    Visit Number 1   Number of Visits 4   Date for OT Re-Evaluation 04/29/16   Authorization Type BCBS Medicare   Authorization Time Period before 10th visit   Authorization - Visit Number 1   Authorization - Number of Visits 10   OT Start Time 1115   OT Stop Time 1200   OT Time Calculation (min) 45 min   Activity Tolerance Patient tolerated treatment well   Behavior During Therapy [facility] for tasks assessed/performed      Past Medical History:  Diagnosis Date  . Acute pancreatitis 09/14/2011  . Atrophy of right kidney 09/15/2011  . CAD (coronary artery disease)    HX OF TWO HEART STENTS PLACED APPROX 10 OR 11 YRS AGO- STATES RELEASED BY CARDIOLOGIST- NO LONGER SEES  . Chronic back pain   . Diabetes (HCC)   . High cholesterol   . Hypertension   . Kidney calculi 09/15/2011  . Myocardial infarction   . Seizures (HCC)    AFTER THE STROKE - BUT NONE SINCE  . Sleep apnea    STATES TRIED CPAP - DID NOT TOLERATE - RETURNED CPAP - DOES NOT HAVE ANYMORE  . Stented coronary artery approx 2004  . Stroke (HCC) 03/18/2012   LEFT SIDED WEAKNESS - STILL HAS SOME LUE>LLE WEAKNESS - ABLE TO AMBULATE WITH OR WITHOUT CANE BUT DOES NOT HAVE USE OF LEFT ARM  . TIA (transient ischemic attack)     Past Surgical History:  Procedure Laterality Date  . BACK SURGERY     LUMBAR SURGERIES x 4 - INCLUDING FUSIONS  . CARPAL TUNNEL REPAIR Left   . CHOLECYSTECTOMY  09/25/2011   Procedure: LAPAROSCOPIC CHOLECYSTECTOMY;  Surgeon: Fabio Bering, MD;  Location: [facility];  Service: General;  Laterality: N/A;  . CYSTOSCOPY WITH RETROGRADE PYELOGRAM,  URETEROSCOPY AND STENT PLACEMENT Right 05/26/2013   Procedure: CYSTOSCOPY, RIGHT RETROGRADE PYELOGRAM, RIGHT URETEROSCOPY AND RIGHT STENT PLACEMENT, stone extraction, biopsy;  Surgeon: Crist Fat, MD;  Location: [facility];  Service: Urology;  Laterality: Right;  . HERNIA REPAIR     INGUINAL HERNIA REPARI MAYBE 30 YRS AGO  . MULTIPLE TOOTH EXTRACTIONS      There were no vitals filed for this visit.      Subjective Assessment - 03/30/16 1236    Subjective  S: I took a nap and when I woke up everything was dark.   Patient is accompained by: Family member  Wife   Pertinent History Patient is a 65 yo male admitted to acute on 03/13/16 with sudden loss of vision in Rt eye.  MRI report states Lt occipital PCA infarct.    PMH:  CVA with Lt hemiparesis and vision loss on left side. Patient initially took a nap and woke up in darkness. Patient reports that now his vision is spotty and he has to turn his head to find the good vision. Dr. Marvel Plan has referred patient to occupational therapy for vision services.     Special Tests E & R cancellation test   Patient Stated Goals None Staetd   Currently in Pain? No/denies  [facility] - 03/30/16 1122      Assessment   Diagnosis Decreased Vision    Referring Provider Dr. Marvel PlanJinDong Xu    Onset Date 03/11/16   Prior Therapy Pt received OT services when admitted in acute. Patient has received OT services at this facility from first CVA in 2014.     Precautions   Precautions Fall     Restrictions   Weight Bearing Restrictions No     Balance Screen   Has the patient fallen in the past 6 months --  Unknown     Home  Environment   Family/patient expects to be discharged to: Private residence   Living Arrangements Spouse/significant other   Lives With Spouse     Prior Function   Level of Independence Needs assistance with ADLs;Requires assistive device for independence   Vocation Retired     ADL   ADL comments Pt  reports difficulty with community mobility and ambulation in the home related to contrast and depth perception. Patient reports that he has difficulty getting into his truck, navigating doorways. He is sensitive to bright lights. Wife reports patient is confused and disoriented in the home stating that she rearranges furnture.      Mobility   Mobility Status History of falls     Written Expression   Dominant Hand Right     Vision - History   Baseline Vision Wears glasses all the time   Visual History Other (comment)  Left homonymous hemianpsia   Patient Visual Report Eye fatigue/eye pain/headache;Unable to keep objects in focus;Nausea/blurring vision with head movement;Overshooting;Undershooting  No depth perception     Vision Assessment   Eye Alignment Within Functional Limits   Vision Assessment Vision tested   Convergence Within functional limits   Visual Fields Left homonymous Hemainpsia  Possible Rt side homonymous Hemianpsia   Depth Perception Impaired     Cognition   Overall Cognitive Status Cognition to be further assessed in functional context PRN                         OT Education - 03/30/16 1256    Education provided Yes   Education Details Patient and wife was given handout for Lighting and Contrast education.    Person(s) Educated Patient;Spouse   Methods Explanation;Handout   Comprehension Verbalized understanding          OT Short Term Goals - 03/31/16 0753      OT SHORT TERM GOAL #1   Title Patient and family will be demonstrate the ability to use compensatory techniques to allow for the completion of functional mobility in the home and in the community in a safe and efficient manner.   Time 4   Period Weeks   Status New     OT SHORT TERM GOAL #2   Title Patient and family will be demonstrate the ability to use compensatory techniques to allow for the completion of self feeding and self care tasks with supervision if needed.    Time  4   Period Weeks   Status New     OT SHORT TERM GOAL #3   Title Patient will be at the highest level of independence with all basic daily activities with supervision if needed.     Time 4   Period Weeks   Status New                  Plan - 03/30/16 1258  Clinical Impression Statement A: Patient is a 65 y/o male S/P L PCA occiciptal lobe infarct causing visual deficits related to vision loss, possible new visual field cut, and is cauing difficulty with mobility and functional activities that need to be completed during the day. Pt possibly may be experiencing topographical disorientation as he has difficulty remembering the layout of his home and he states that his wife rearranges the furniture every day which she does not.     Rehab Potential Good   Clinical Impairments Affecting Rehab Potential Patient has a hx of CVA with left side hemiparesis. Left Homonymous hemianopsia   OT Frequency 1x / week   OT Duration 4 weeks   OT Treatment/Interventions Self-care/ADL training;Therapeutic exercise;Patient/family education;Therapeutic activities;Cognitive remediation/compensation   Plan P: Patient will benefit from skilled OT services for training in adapative devices and/or compensatory techniques for completion of ADL and functional activities. Treatment Plan: Research possible treatment options for topographical disorientation and work on safer mobility in the community and home.   Recommended Other Services Patient to schedule a visit with optometrist to complete a visual field scan.   Consulted and Agree with Plan of Care Patient;Family member/caregiver   Family Member Consulted wife      Patient will benefit from skilled therapeutic intervention in order to improve the following deficits and impairments:  Impaired vision/preception  Visit Diagnosis: Blindness, one eye, low vision other eye, unspecified eyes      G-Codes - 03/30/16 1519    Functional Assessment Tool Used  clinicla judgement   Functional Limitation Other OT primary   Other OT Primary Current Status ([id number]([id number]) At least 80 percent but less than 100 percent impaired, limited or restricted   Other OT Primary Goal Status (W2956(G8991) At least 60 percent but less than 80 percent impaired, limited or restricted      Problem List Patient Active Problem List   Diagnosis Date Noted  . Blurry vision, bilateral   . CVA (cerebral vascular accident) (HCC) 03/13/2016  . Diabetes (HCC) 03/13/2016  . Essential hypertension 03/13/2016  . Hyperlipidemia 03/13/2016  . Muscle weakness (generalized) 05/31/2012  . Difficulty walking 05/31/2012  . Lack of coordination 05/31/2012  . Cytotoxic cerebral edema (HCC) 03/19/2012  . Aortic thromboembolism (HCC) 03/19/2012  . Stroke, acute, embolic, right 03/18/2012  . Seizure (HCC) 03/18/2012  . Stented coronary artery   . Hemiplegia affecting nondominant side (HCC) 03/17/2012  . Atrophy of right kidney 09/15/2011  . Kidney calculi 09/15/2011  . Acute pancreatitis 09/14/2011  . Cholelithiasis 09/14/2011  . Hyperglycemia 09/14/2011   Limmie PatriciaLaura Clorene Nerio, OTR/L,CBIS  [phone]  03/31/2016, 8:06 AM  [facility] [place] [facility] [address] , [place], [address] Phone: [phone]   Fax:  [phone]  Name: Shaun Pugh MRN: [id number] Date of Birth: 1950-12-18

## 2016-04-08 ENCOUNTER — Ambulatory Visit (HOSPITAL_COMMUNITY): Payer: Medicare Other

## 2016-04-08 ENCOUNTER — Encounter (HOSPITAL_COMMUNITY): Payer: Self-pay

## 2016-04-08 DIAGNOSIS — H5410 Blindness, one eye, low vision other eye, unspecified eyes: Secondary | ICD-10-CM

## 2016-04-08 NOTE — Patient Instructions (Signed)
Homework Recommendations - Start giving more verbal cues for directions versus physical assist. Refrain from holding onto Shaun Pugh and directing him. Give precise, short directions. He will then need to focus and scan the environment more himself. - Work on scanning to the left and the right more. Give him verbal cues and reminders as needed.

## 2016-04-08 NOTE — Therapy (Signed)
[place] [facility] [address] [place], [place], [address] Phone: [phone]   Fax:  [phone]  Occupational Therapy Treatment  Patient Details  Name: Shaun Pugh Snuffer MRN: [id number] Date of Birth: Sep 30, 1950 Referring Provider: Dr. Marvel PlanJinDong Xu   Encounter Date: 04/08/2016      OT End of Session - 04/08/16 0937    Visit Number 2   Number of Visits 4   Date for OT Re-Evaluation 04/29/16   Authorization Type BCBS Medicare   Authorization Time Period before 10th visit   Authorization - Visit Number 2   Authorization - Number of Visits 10   OT Start Time 0820   OT Stop Time 0908   OT Time Calculation (min) 48 min   Activity Tolerance Patient tolerated treatment well   Behavior During Therapy [facility] for tasks assessed/performed      Past Medical History:  Diagnosis Date  . Acute pancreatitis 09/14/2011  . Atrophy of right kidney 09/15/2011  . CAD (coronary artery disease)    HX OF TWO HEART STENTS PLACED APPROX 10 OR 11 YRS AGO- STATES RELEASED BY CARDIOLOGIST- NO LONGER SEES  . Chronic back pain   . Diabetes (HCC)   . High cholesterol   . Hypertension   . Kidney calculi 09/15/2011  . Myocardial infarction   . Seizures (HCC)    AFTER THE STROKE - BUT NONE SINCE  . Sleep apnea    STATES TRIED CPAP - DID NOT TOLERATE - RETURNED CPAP - DOES NOT HAVE ANYMORE  . Stented coronary artery approx 2004  . Stroke (HCC) 03/18/2012   LEFT SIDED WEAKNESS - STILL HAS SOME LUE>LLE WEAKNESS - ABLE TO AMBULATE WITH OR WITHOUT CANE BUT DOES NOT HAVE USE OF LEFT ARM  . TIA (transient ischemic attack)     Past Surgical History:  Procedure Laterality Date  . BACK SURGERY     LUMBAR SURGERIES x 4 - INCLUDING FUSIONS  . CARPAL TUNNEL REPAIR Left   . CHOLECYSTECTOMY  09/25/2011   Procedure: LAPAROSCOPIC CHOLECYSTECTOMY;  Surgeon: Fabio BeringBrent C Ziegler, MD;  Location: [facility];  Service: General;  Laterality: N/A;  . CYSTOSCOPY WITH RETROGRADE PYELOGRAM, URETEROSCOPY  AND STENT PLACEMENT Right 05/26/2013   Procedure: CYSTOSCOPY, RIGHT RETROGRADE PYELOGRAM, RIGHT URETEROSCOPY AND RIGHT STENT PLACEMENT, stone extraction, biopsy;  Surgeon: Crist FatBenjamin W Herrick, MD;  Location: [facility];  Service: Urology;  Laterality: Right;  . HERNIA REPAIR     INGUINAL HERNIA REPARI MAYBE 30 YRS AGO  . MULTIPLE TOOTH EXTRACTIONS      There were no vitals filed for this visit.      Subjective Assessment - 04/08/16 0935    Subjective  S: I told my wife she needs to stop moving the lightswitch.   Currently in Pain? No/denies            [facility] Assessment - 04/08/16 0933      Assessment   Diagnosis Decreased Vision      Precautions   Precautions Fall                  OT Treatments/Exercises (OP) - 04/08/16 0932      Exercises   Exercises Visual/Perceptual     Visual/Perceptual Exercises   Scanning - Environmental Visual scanning activity completed with numbered post its (1-12) placed on both left and right side of pathway. Pt required intermittent VC to scan right or left environment to locate post its. Difficulty with locating number 1 and number 12 when  the numbers were close/next to him.                OT Education - 04/08/16 0933    Education provided Yes   Education Details See patient instructions. Patient was given recommendations for scanning and direction tasks at home. Spoke with wife and patient. Discussed working on more verbal directions in the home and once she feels comfortable then transition out of the home.   Person(s) Educated Patient;Spouse   Methods Explanation;Handout   Comprehension Verbalized understanding          OT Short Term Goals - 04/08/16 0941      OT SHORT TERM GOAL #1   Title Patient and family will be demonstrate the ability to use compensatory techniques to allow for the completion of functional mobility in the home and in the community in a safe and efficient manner.   Time 4   Period Weeks    Status On-going     OT SHORT TERM GOAL #2   Title Patient and family will be demonstrate the ability to use compensatory techniques to allow for the completion of self feeding and self care tasks with supervision if needed.    Time 4   Period Weeks   Status On-going     OT SHORT TERM GOAL #3   Title Patient will be at the highest level of independence with all basic daily activities with supervision if needed.     Time 4   Period Weeks   Status On-going                  Plan - 04/08/16 [id number]    Clinical Impression Statement A: With more research patient may be experiencing topographagnosia after his recent CVA. Session focused on visual scanning abilities in the clinic environment. Education given to have patient's wife work on giving more verbal cues for direction versus physical cueing.    Plan P: complete a Find the difference task/worksheet      Patient will benefit from skilled therapeutic intervention in order to improve the following deficits and impairments:  Impaired vision/preception  Visit Diagnosis: Blindness, one eye, low vision other eye, unspecified eyes    Problem List Patient Active Problem List   Diagnosis Date Noted  . Blurry vision, bilateral   . CVA (cerebral vascular accident) (HCC) 03/13/2016  . Diabetes (HCC) 03/13/2016  . Essential hypertension 03/13/2016  . Hyperlipidemia 03/13/2016  . Muscle weakness (generalized) 05/31/2012  . Difficulty walking 05/31/2012  . Lack of coordination 05/31/2012  . Cytotoxic cerebral edema (HCC) 03/19/2012  . Aortic thromboembolism (HCC) 03/19/2012  . Stroke, acute, embolic, right 03/18/2012  . Seizure (HCC) 03/18/2012  . Stented coronary artery   . Hemiplegia affecting nondominant side (HCC) 03/17/2012  . Atrophy of right kidney 09/15/2011  . Kidney calculi 09/15/2011  . Acute pancreatitis 09/14/2011  . Cholelithiasis 09/14/2011  . Hyperglycemia 09/14/2011   Limmie PatriciaLaura Addisyn Leclaire, OTR/L,CBIS   [phone]  04/08/2016, 9:42 AM   [facility] [address] [place], [place], [address] Phone: [phone]   Fax:  [phone]  Name: Shaun Pugh Coppin MRN: [id number] Date of Birth: [birth year]

## 2016-04-14 DIAGNOSIS — Z09 Encounter for follow-up examination after completed treatment for conditions other than malignant neoplasm: Secondary | ICD-10-CM | POA: Diagnosis not present

## 2016-04-14 DIAGNOSIS — Z119 Encounter for screening for infectious and parasitic diseases, unspecified: Secondary | ICD-10-CM | POA: Diagnosis not present

## 2016-04-14 DIAGNOSIS — Z125 Encounter for screening for malignant neoplasm of prostate: Secondary | ICD-10-CM | POA: Diagnosis not present

## 2016-04-14 DIAGNOSIS — I639 Cerebral infarction, unspecified: Secondary | ICD-10-CM | POA: Diagnosis not present

## 2016-04-14 DIAGNOSIS — Z1159 Encounter for screening for other viral diseases: Secondary | ICD-10-CM | POA: Diagnosis not present

## 2016-04-14 DIAGNOSIS — E119 Type 2 diabetes mellitus without complications: Secondary | ICD-10-CM | POA: Diagnosis not present

## 2016-04-17 DIAGNOSIS — R42 Dizziness and giddiness: Secondary | ICD-10-CM | POA: Diagnosis not present

## 2016-04-17 DIAGNOSIS — E119 Type 2 diabetes mellitus without complications: Secondary | ICD-10-CM | POA: Diagnosis not present

## 2016-04-17 DIAGNOSIS — L409 Psoriasis, unspecified: Secondary | ICD-10-CM | POA: Diagnosis not present

## 2016-04-17 DIAGNOSIS — I639 Cerebral infarction, unspecified: Secondary | ICD-10-CM | POA: Diagnosis not present

## 2016-04-17 DIAGNOSIS — Z0001 Encounter for general adult medical examination with abnormal findings: Secondary | ICD-10-CM | POA: Diagnosis not present

## 2016-04-17 DIAGNOSIS — R531 Weakness: Secondary | ICD-10-CM | POA: Diagnosis not present

## 2016-04-17 DIAGNOSIS — H6120 Impacted cerumen, unspecified ear: Secondary | ICD-10-CM | POA: Diagnosis not present

## 2016-04-17 DIAGNOSIS — R944 Abnormal results of kidney function studies: Secondary | ICD-10-CM | POA: Diagnosis not present

## 2016-04-17 DIAGNOSIS — I69359 Hemiplegia and hemiparesis following cerebral infarction affecting unspecified side: Secondary | ICD-10-CM | POA: Diagnosis not present

## 2016-04-17 DIAGNOSIS — I10 Essential (primary) hypertension: Secondary | ICD-10-CM | POA: Diagnosis not present

## 2016-04-17 DIAGNOSIS — E782 Mixed hyperlipidemia: Secondary | ICD-10-CM | POA: Diagnosis not present

## 2016-04-20 ENCOUNTER — Ambulatory Visit (HOSPITAL_COMMUNITY): Payer: Medicare Other | Attending: Neurology

## 2016-04-20 ENCOUNTER — Encounter (HOSPITAL_COMMUNITY): Payer: Self-pay

## 2016-04-20 DIAGNOSIS — H5410 Blindness, one eye, low vision other eye, unspecified eyes: Secondary | ICD-10-CM | POA: Diagnosis not present

## 2016-04-20 NOTE — Therapy (Signed)
[place] [facility] [address] [place], [place], [address] Phone: [phone]   Fax:  [phone]  Occupational Therapy Vision Treatment  Patient Details  Name: Shaun Pugh MRN: [id number] Date of Birth: 1950/05/21 Referring Provider: Dr. Marvel PlanJinDong Xu   Encounter Date: 04/20/2016      OT End of Session - 04/20/16 1501    Visit Number 3   Number of Visits 4   Date for OT Re-Evaluation 04/29/16   Authorization Type BCBS Medicare   Authorization Time Period before 10th visit   Authorization - Visit Number 3   Authorization - Number of Visits 10   OT Start Time [phone]   OT Stop Time 1030   OT Time Calculation (min) 40 min   Activity Tolerance Patient tolerated treatment well   Behavior During Therapy [facility] for tasks assessed/performed      Past Medical History:  Diagnosis Date  . Acute pancreatitis 09/14/2011  . Atrophy of right kidney 09/15/2011  . CAD (coronary artery disease)    HX OF TWO HEART STENTS PLACED APPROX 10 OR 11 YRS AGO- STATES RELEASED BY CARDIOLOGIST- NO LONGER SEES  . Chronic back pain   . Diabetes (HCC)   . High cholesterol   . Hypertension   . Kidney calculi 09/15/2011  . Myocardial infarction   . Seizures (HCC)    AFTER THE STROKE - BUT NONE SINCE  . Sleep apnea    STATES TRIED CPAP - DID NOT TOLERATE - RETURNED CPAP - DOES NOT HAVE ANYMORE  . Stented coronary artery approx 2004  . Stroke (HCC) 03/18/2012   LEFT SIDED WEAKNESS - STILL HAS SOME LUE>LLE WEAKNESS - ABLE TO AMBULATE WITH OR WITHOUT CANE BUT DOES NOT HAVE USE OF LEFT ARM  . TIA (transient ischemic attack)     Past Surgical History:  Procedure Laterality Date  . BACK SURGERY     LUMBAR SURGERIES x 4 - INCLUDING FUSIONS  . CARPAL TUNNEL REPAIR Left   . CHOLECYSTECTOMY  09/25/2011   Procedure: LAPAROSCOPIC CHOLECYSTECTOMY;  Surgeon: Fabio BeringBrent C Ziegler, MD;  Location: [facility];  Service: General;  Laterality: N/A;  . CYSTOSCOPY WITH RETROGRADE PYELOGRAM,  URETEROSCOPY AND STENT PLACEMENT Right 05/26/2013   Procedure: CYSTOSCOPY, RIGHT RETROGRADE PYELOGRAM, RIGHT URETEROSCOPY AND RIGHT STENT PLACEMENT, stone extraction, biopsy;  Surgeon: Crist FatBenjamin W Herrick, MD;  Location[facility];  Service: Urology;  Laterality: Right;  . HERNIA REPAIR     INGUINAL HERNIA REPARI MAYBE 30 YRS AGO  . MULTIPLE TOOTH EXTRACTIONS      There were no vitals filed for this visit.      Subjective Assessment - 04/20/16 1456    Subjective  S Wife reports that he is getting better at scanning at home.    Currently in Pain? No/denies            [facility] - 04/20/16 1457      Assessment   Diagnosis Decreased Vision      Precautions   Precautions Fall                  OT Treatments/Exercises (OP) - 04/20/16 1457      Exercises   Exercises Visual/Perceptual     Visual/Perceptual Exercises   Scanning - Other Completed "Find the Difference" and "Hidden Items" worksheet. Min difficulty with Hidden items worksheet. Increased difficulty with locating the difference between 2-3 pictures. Even when given the location of the difference he continued to struggle and required increased time.  OT Short Term Goals - 04/08/16 0941      OT SHORT TERM GOAL #1   Title Patient and family will be demonstrate the ability to use compensatory techniques to allow for the completion of functional mobility in the home and in the community in a safe and efficient manner.   Time 4   Period Weeks   Status On-going     OT SHORT TERM GOAL #2   Title Patient and family will be demonstrate the ability to use compensatory techniques to allow for the completion of self feeding and self care tasks with supervision if needed.    Time 4   Period Weeks   Status On-going     OT SHORT TERM GOAL #3   Title Patient will be at the highest level of independence with all basic daily activities with supervision if needed.     Time 4   Period  Weeks   Status On-going                  Plan - 04/20/16 1502    Clinical Impression Statement A: Session focused on scanning task with finding the difference between two photos and locating items. Max diffculty with locating the difference between 2 photos. Required increased prompting and additional time. Discussed session with wife.    Plan P: Continue with "find the difference" worksheet. Discharge next session.      Patient will benefit from skilled therapeutic intervention in order to improve the following deficits and impairments:  Impaired vision/preception  Visit Diagnosis: Blindness, one eye, low vision other eye, unspecified eyes    Problem List Patient Active Problem List   Diagnosis Date Noted  . Blurry vision, bilateral   . CVA (cerebral vascular accident) (HCC) 03/13/2016  . Diabetes (HCC) 03/13/2016  . Essential hypertension 03/13/2016  . Hyperlipidemia 03/13/2016  . Muscle weakness (generalized) 05/31/2012  . Difficulty walking 05/31/2012  . Lack of coordination 05/31/2012  . Cytotoxic cerebral edema (HCC) 03/19/2012  . Aortic thromboembolism (HCC) 03/19/2012  . Stroke, acute, embolic, right 03/18/2012  . Seizure (HCC) 03/18/2012  . Stented coronary artery   . Hemiplegia affecting nondominant side (HCC) 03/17/2012  . Atrophy of right kidney 09/15/2011  . Kidney calculi 09/15/2011  . Acute pancreatitis 09/14/2011  . Cholelithiasis 09/14/2011  . Hyperglycemia 09/14/2011   Limmie PatriciaLaura Deidre Carino, OTR/L,CBIS  [phone]  04/20/2016, 3:04 PM  [place] [facility] [address] [place], [place], [address] Phone: [phone]   Fax:  [phone]  Name: Shaun Pugh MRN: [id number] Date of Birth: 1951-01-09

## 2016-04-21 DIAGNOSIS — E119 Type 2 diabetes mellitus without complications: Secondary | ICD-10-CM | POA: Diagnosis not present

## 2016-04-27 ENCOUNTER — Ambulatory Visit (HOSPITAL_COMMUNITY): Payer: Medicare Other

## 2016-04-27 ENCOUNTER — Encounter (HOSPITAL_COMMUNITY): Payer: Self-pay

## 2016-04-27 DIAGNOSIS — H5410 Blindness, one eye, low vision other eye, unspecified eyes: Secondary | ICD-10-CM | POA: Diagnosis not present

## 2016-04-27 NOTE — Therapy (Signed)
[place], [place], [address] Phone: [phone]   Fax:  [phone]  Occupational Therapy Treatment  Patient Details  Name: Shaun Pugh MRN: [id number] Date of Birth: 1951-04-25 Referring Provider: Dr. Rosalin Hawking   Encounter Date: 04/27/2016      OT End of Session - 04/27/16 1243    Visit Number 4   Number of Visits 4   Date for OT Re-Evaluation 04/29/16   Authorization Type BCBS Medicare   Authorization Time Period before 10th visit   Authorization - Visit Number 4   Authorization - Number of Visits 10   OT Start Time [phone]   OT Stop Time 1030   OT Time Calculation (min) 42 min   Activity Tolerance Patient tolerated treatment well   Behavior During Therapy [facility] for tasks assessed/performed      Past Medical History:  Diagnosis Date  . Acute pancreatitis 09/14/2011  . Atrophy of right kidney 09/15/2011  . CAD (coronary artery disease)    HX OF TWO HEART STENTS PLACED APPROX 10 OR 11 YRS AGO- STATES RELEASED BY CARDIOLOGIST- NO LONGER SEES  . Chronic back pain   . Diabetes ([place])   . High cholesterol   . Hypertension   . Kidney calculi 09/15/2011  . Myocardial infarction   . Seizures ([facility])    AFTER THE STROKE - BUT NONE SINCE  . Sleep apnea    STATES TRIED CPAP - DID NOT TOLERATE - RETURNED CPAP - DOES NOT HAVE ANYMORE  . Stented coronary artery approx 2004  . Stroke ([place]) 03/18/2012   LEFT SIDED WEAKNESS - STILL HAS SOME LUE>LLE WEAKNESS - ABLE TO AMBULATE WITH OR WITHOUT CANE BUT DOES NOT HAVE USE OF LEFT ARM  . TIA (transient ischemic attack)     Past Surgical History:  Procedure Laterality Date  . BACK SURGERY     LUMBAR SURGERIES x 4 - INCLUDING FUSIONS  . CARPAL TUNNEL REPAIR Left   . CHOLECYSTECTOMY  09/25/2011   Procedure: LAPAROSCOPIC CHOLECYSTECTOMY;  Surgeon: Donato Heinz, MD;  Location: [facility];  Service: General;  Laterality: N/A;  . CYSTOSCOPY WITH RETROGRADE PYELOGRAM, URETEROSCOPY  AND STENT PLACEMENT Right 05/26/2013   Procedure: CYSTOSCOPY, RIGHT RETROGRADE PYELOGRAM, RIGHT URETEROSCOPY AND RIGHT STENT PLACEMENT, stone extraction, biopsy;  Surgeon: Ardis Hughs, MD;  Location: [facility];  Service: Urology;  Laterality: Right;  . HERNIA REPAIR     INGUINAL HERNIA REPARI MAYBE 30 YRS AGO  . MULTIPLE TOOTH EXTRACTIONS      There were no vitals filed for this visit.      Subjective Assessment - 04/27/16 1108    Subjective  S: Jenny Reichmann is getting better at directing me versus holding me and leading me.    Currently in Pain? No/denies            [facility] OT Assessment - 04/27/16 1108      Assessment   Diagnosis Decreased Vision      Precautions   Precautions Fall                  OT Treatments/Exercises (OP) - 04/27/16 1231      Exercises   Exercises Visual/Perceptual     Visual/Perceptual Exercises   Scanning - Other Pt completed "Hidden Pictures" worksheet focusing on visual scanning and direction following. Patient with mod-max difficulty with task requiring cues for location of items as well as increased time.  OT Education - May 20, 2016 1236    Education provided Yes   Education Details Pt asked when he should see the eye Doctor. Therapist recommended that patient schedule a routine eye exam with a visual field scan.    Person(s) Educated Patient   Methods Explanation   Comprehension Verbalized understanding          OT Short Term Goals - 05-20-16 1245      OT SHORT TERM GOAL #1   Title Patient and family will be demonstrate the ability to use compensatory techniques to allow for the completion of functional mobility in the home and in the community in a safe and efficient manner.   Time 4   Period Weeks   Status Achieved     OT SHORT TERM GOAL #2   Title Patient and family will be demonstrate the ability to use compensatory techniques to allow for the completion of self feeding and self care tasks with  supervision if needed.    Time 4   Period Weeks   Status Achieved     OT SHORT TERM GOAL #3   Title Patient will be at the highest level of independence with all basic daily activities with supervision if needed.     Time 4   Period Weeks   Status Achieved                  Plan - May 20, 2016 1243    Clinical Impression Statement A: Patient reports that he is working on the compensatory techniques that have been given to him and his wife to increase his mobility in home and in the community as well as completing daily tasks due to his lack of vision. He continues to require supervision due to left side neglect and new visual defciits. All goals have been met related to vision and patient is in agreement with discharge.   Plan P: D/C from therapy.      Patient will benefit from skilled therapeutic intervention in order to improve the following deficits and impairments:  Impaired vision/preception  Visit Diagnosis: Blindness, one eye, low vision other eye, unspecified eyes      G-Codes - 2016/05/20 1245    Functional Assessment Tool Used clinical judgement   Functional Limitation Other OT primary   Other OT Primary Goal Status (S2831) At least 60 percent but less than 80 percent impaired, limited or restricted   Other OT Primary Discharge Status [phone]) At least 60 percent but less than 80 percent impaired, limited or restricted      Problem List Patient Active Problem List   Diagnosis Date Noted  . Blurry vision, bilateral   . CVA (cerebral vascular accident) ([place]) 03/13/2016  . Diabetes ([place]) 03/13/2016  . Essential hypertension 03/13/2016  . Hyperlipidemia 03/13/2016  . Muscle weakness (generalized) 05/31/2012  . Difficulty walking 05/31/2012  . Lack of coordination 05/31/2012  . Cytotoxic cerebral edema ([place]) 03/19/2012  . Aortic thromboembolism ([place]) 03/19/2012  . Stroke, acute, embolic, right 60/73/7106  . Seizure ([place]) 03/18/2012  . Stented coronary artery    . Hemiplegia affecting nondominant side ([place]) 03/17/2012  . Atrophy of right kidney 09/15/2011  . Kidney calculi 09/15/2011  . Acute pancreatitis 09/14/2011  . Cholelithiasis 09/14/2011  . Hyperglycemia 09/14/2011    OCCUPATIONAL THERAPY DISCHARGE SUMMARY  Visits from Start of Care: 4  Current functional level related to goals / functional outcomes: See goals above   Remaining deficits: See above   Education / Equipment: See  above  Plan: Patient agrees to discharge.  Patient goals were met. Patient is being discharged due to meeting the stated rehab goals.  ?????        Ailene Ravel, OTR/L,CBIS  [phone]  04/27/2016, 12:48 PM  [place] [address] [place], [place], [address] Phone: [phone]   Fax:  [phone]  Name: Shaun Pugh MRN: [id number] Date of Birth: 1951-01-26

## 2016-05-12 DIAGNOSIS — Z7901 Long term (current) use of anticoagulants: Secondary | ICD-10-CM | POA: Diagnosis not present

## 2016-05-12 DIAGNOSIS — I639 Cerebral infarction, unspecified: Secondary | ICD-10-CM | POA: Diagnosis not present

## 2016-05-13 ENCOUNTER — Ambulatory Visit (HOSPITAL_COMMUNITY): Payer: Medicare Other

## 2016-06-09 DIAGNOSIS — Z7901 Long term (current) use of anticoagulants: Secondary | ICD-10-CM | POA: Diagnosis not present

## 2016-06-09 DIAGNOSIS — I639 Cerebral infarction, unspecified: Secondary | ICD-10-CM | POA: Diagnosis not present

## 2016-06-16 ENCOUNTER — Encounter: Payer: Self-pay | Admitting: Neurology

## 2016-06-16 ENCOUNTER — Ambulatory Visit (INDEPENDENT_AMBULATORY_CARE_PROVIDER_SITE_OTHER): Payer: Medicare Other | Admitting: Neurology

## 2016-06-16 VITALS — BP 122/84 | HR 85 | Ht 73.0 in | Wt 274.8 lb

## 2016-06-16 DIAGNOSIS — I6521 Occlusion and stenosis of right carotid artery: Secondary | ICD-10-CM

## 2016-06-16 DIAGNOSIS — E7849 Other hyperlipidemia: Secondary | ICD-10-CM

## 2016-06-16 DIAGNOSIS — I10 Essential (primary) hypertension: Secondary | ICD-10-CM

## 2016-06-16 DIAGNOSIS — I7410 Embolism and thrombosis of unspecified parts of aorta: Secondary | ICD-10-CM

## 2016-06-16 DIAGNOSIS — I748 Embolism and thrombosis of other arteries: Secondary | ICD-10-CM

## 2016-06-16 DIAGNOSIS — I639 Cerebral infarction, unspecified: Secondary | ICD-10-CM

## 2016-06-16 DIAGNOSIS — I7411 Embolism and thrombosis of thoracic aorta: Secondary | ICD-10-CM | POA: Diagnosis not present

## 2016-06-16 DIAGNOSIS — I63432 Cerebral infarction due to embolism of left posterior cerebral artery: Secondary | ICD-10-CM | POA: Diagnosis not present

## 2016-06-16 DIAGNOSIS — E784 Other hyperlipidemia: Secondary | ICD-10-CM

## 2016-06-16 NOTE — Patient Instructions (Signed)
-   continue coumadin for now, and INR goal 2-3. - will repeat CT angiogram to evaluate the clot - check BP at home and record - Follow up with your primary care physician for stroke risk factor modification. Recommend maintain blood pressure goal <140/80, diabetes with hemoglobin A1c goal below 7.0% and lipids with LDL cholesterol goal below 70 mg/dL.  - healthy diet and home exercise - will check if you can be eligible for injection medication for cholesterol - consider driving rehab evaluation if interested.  - follow up in 4 months with me.

## 2016-06-17 DIAGNOSIS — I6521 Occlusion and stenosis of right carotid artery: Secondary | ICD-10-CM | POA: Insufficient documentation

## 2016-06-17 DIAGNOSIS — I748 Embolism and thrombosis of other arteries: Secondary | ICD-10-CM | POA: Insufficient documentation

## 2016-06-17 MED ORDER — EVOLOCUMAB 140 MG/ML ~~LOC~~ SOSY: 140.0000 mg | PREFILLED_SYRINGE | SUBCUTANEOUS | 3 refills | Status: DC

## 2016-06-17 NOTE — Progress Notes (Addendum)
STROKE NEUROLOGY FOLLOW UP NOTE  NAME: Shaun Pugh DOB: Sep 16, 1950  REASON FOR VISIT: stroke follow up HISTORY FROM: pt and wife and chart  Today we had the pleasure of seeing Shaun Pugh in follow-up at our Neurology Clinic. Pt was accompanied by wife.   History Summary Shaun Pugh is a 66 y.o. male with history of right MCA stroke in 2013, CAD/MI s/p cardiac stent, DM, atrophy of the right kidney, HLD, OSA, HTN, and seizures was admitted on 03/13/16 for visual deficits. MRI acute left PCA infarct, and remote large right MCA infarct. MRA suspected right P2 occlusion or high-grade stenosis and suspected interval distal right vertebral artery occlusion. 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 Also right ICA high grade 80-90% stenosis, worsening than 2013 which was 60%. EF 50-55%, LDL 113 and A1C 6.3. His current PCA infarct was considered from subclavian A thrombus proximal to [facility] take off. His old MCA infarct could be due to aortic arch near innominate artery or due to right ICA stenosis. He was put on coumadin for anticoagulation as well as lipitor. He was advised no driving on discharge.  Interval History During the interval time, the patient has been doing better. He still has visual deficit and not driving. Not able to tolerate statin including crestor and lipitor. lipitor stopped. As per wife, with crestor and lipitor, pt complained of joint pain and weakness. Once statin stopped, symptoms resolved. Tried to use PCSK but too high copay. He had hematuria 03/26/16 due to high INR 4.1 with UTI. Coumadin was on hold briefly and resumed. Last INR 2.2. BP today 122/84. Finished outpt OT. No other complains.     REVIEW OF SYSTEMS: Full 14 system review of systems performed and notable only for those listed below and in HPI above, all others are negative:  Constitutional:  Activity  change Cardiovascular:  Ear/Nose/Throat:   Skin: Moles Eyes:  Light sensitivity, loss of vision Respiratory:  SOB Gastroitestinal:   Genitourinary: Difficulty urination Hematology/Lymphatic:   Endocrine:  Musculoskeletal:  Walking difficulty Allergy/Immunology:   Neurological:  Memory loss, dizziness, headache, numbness Psychiatric: Agitation, decreased concentration, depression Sleep:   The following represents the patient's updated allergies and side effects list: No Known Allergies  The neurologically relevant items on the patient's problem list were reviewed on today's visit.  Neurologic Examination  A problem focused neurological exam (12 or more points of the single system neurologic examination, vital signs counts as 1 point, cranial nerves count for 8 points) was performed.  Blood pressure 122/84, pulse 85, height 6\' 1"  (1.854 m), weight 274 lb 12.8 oz (124.6 kg).  General - obese, well developed, in no apparent distress.  Ophthalmologic - Fundi not visualized due to noncooperation.  Cardiovascular - Regular rate and rhythm with no murmur.  Mental Status -  Level of arousal and orientation to time, place, and person were intact. Language including expression, naming, repetition, comprehension was assessed and found intact. Fund of Knowledge was assessed and was intact.  Cranial Nerves II - XII - II - Visual field exam showed b/l lower quadrantanopia. III, IV, VI - Extraocular movements intact. V - Facial sensation intact bilaterally. VII - left facial droop which is old from previous stroke. VIII - Hearing & vestibular intact bilaterally. X - Palate elevates symmetrically. XI - Chin turning & shoulder shrug intact bilaterally. XII - Tongue protrusion intact.  Motor Strength -  The patient's strength was normal in RUE and BLE, but LUE 3/5 proximal and distal which is old from previous stroke.  Bulk was normal and fasciculations were absent.   Motor Tone - Muscle  tone was assessed at the neck and appendages and was normal.  Reflexes - The patient's reflexes were 1+ in all extremities and he had no pathological reflexes.  Sensory - Light touch, temperature/pinprick were assessed and were normal.    Coordination - The patient had normal movements in the right hand with no ataxia or dysmetria.  Tremor was absent.  Gait and Station - The patient's transfers, posture, gait, station, and turns were observed as normal.   Functional score  mRS = 3   0 - No symptoms.   1 - No significant disability. Able to carry out all usual activities, despite some symptoms.   2 - Slight disability. Able to look after own affairs without assistance, but unable to carry out all previous activities.   3 - Moderate disability. Requires some help, but able to walk unassisted.   4 - Moderately severe disability. Unable to attend to own bodily needs without assistance, and unable to walk unassisted.   5 - Severe disability. Requires constant nursing care and attention, bedridden, incontinent.   6 - Dead.   NIH Stroke Scale   Level Of Consciousness 0=Alert; keenly responsive 1=Not alert, but arousable by minor stimulation 2=Not alert, requires repeated stimulation 3=Responds only with reflex movements 0  LOC Questions to Month and Age 74=Answers both questions correctly 1=Answers one question correctly 2=Answers neither question correctly 0  LOC Commands      -Open/Close eyes     -Open/close grip 0=Performs both tasks correctly 1=Performs one task correctly 2=Performs neighter task correctly 0  Best Gaze 0=Normal 1=Partial gaze palsy 2=Forced deviation, or total gaze paresis 0  Visual 0=No visual loss 1=Partial hemianopia 2=Complete hemianopia 3=Bilateral hemianopia (blind including cortical blindness) 1  Facial Palsy 0=Normal symmetrical movement 1=Minor paralysis (asymmetry) 2=Partial paralysis (lower face) 3=Complete paralysis (upper and lower  face) 1  Motor  0=No drift, limb holds posture for full 10 seconds 1=Drift, limb holds posture, no drift to bed 2=Some antigravity effort, cannot maintain posture, drifts to bed 3=No effort against gravity, limb falls 4=No movement Right Arm 0     Leg 0    Left Arm 1     Leg 0  Limb Ataxia 0=Absent 1=Present in one limb 2=Present in two limbs 0  Sensory 0=Normal 1=Mild to moderate sensory loss 2=Severe to total sensory loss 0  Best Language 0=No aphasia, normal 1=Mild to moderate aphasia 2=Mute, global aphasia 3=Mute, global aphasia 0  Dysarthria 0=Normal 1=Mild to moderate 2=Severe, unintelligible or mute/anarthric 0  Extinction/Neglect 0=No abnormality 1=Extinction to bilateral simultaneous stimulation 2=Profound neglect 0  Total   3     Data reviewed: I personally reviewed the images and agree with the radiology interpretations.  CT of the brain  03/17/2012 No acute intracranial abnormality seen.  03/17/2012 Paranasal sinus mucosal thickening and fluid in the left mastoid air cells. Otherwise no acute intracranial pathology.  11/10/2013No significant change in the appearance of large right middle cerebral artery distribution infarct.   Ct Angio Head 03/17/2012 1. Decreased enhancement of the right MCA M2 branches of the anterior sylvian division. No major branch occlusion, no intracranial hemodynamically significant stenosis identified. Otherwise minimal to mild intracranial atherosclerosis. 2. Subtle cortical hypodensity in the anterior right superior frontal gyrus suspicious for ischemia in this setting. No  mass effect or hemorrhage. 3. Age indeterminate but favor chronic lacunar infarct in the right thalamus.   Ct Angio Neck 03/17/2012 1. Ulcerated plaque at the right ICA origin resulting in 60% right ICA stenosis. 2. Findings suspicious for 10 mm of focal thrombus in the proximal aortic arch (see axial image 6). 3. Mild left carotid atherosclerosis. Dominant right  vertebral artery. 4. See intracranial findings below. 5. Small 9 mm nodule in the right parotid gland is indeterminate.   2D Echocardiogram Left ventricle: The cavity size was normal. Wall thicknesswas increased in a pattern of moderate LVH. Systolicfunction was vigorous. The estimated ejection fraction wasin the range of 65% to 70%. Regional wall motioabnormalities cannot be excluded. Left ventriculardiastolic function parameters were normal.- Left atrium: The atrium was mildly dilated. Impressions:- No cardiac source of emboli was indentified   Carotid Doppler 2013: Right: 60-79% internal carotid artery stenosis. Left: No evidence of hemodynamically significant internal carotid artery stenosis. Bilateral: Vertebral artery flow is antegrade.   Ct Head Wo Contrast 03/13/2016 1. No acute hemorrhage or focal mass lesion visualized.  2. Evidence of old large right-sided hemispheric infarct.  3. Focal hypodense area within the left parasagittal occipital lobe, new since 2013, likely old given absence of significant surrounding mass effect, however MRI could be obtained to confirm that this does not represent subacute ischemic change.  4. Old lacunar infarcts within the thalamus. Old right cerebellar infarct.   MR MRA Head / Brain Wo Contrast (motion degraded) 03/14/2016 1. Incomplete MRI. Acute left occipital lobe PCA infarct. 2. Large chronic right cerebral hemispheric infarct. 3. Motion degraded head MRA. P1 and proximal left P2 segments are grossly patent. Suspected right P2 occlusion or high-grade stenosis. 4. Suspected interval distal right vertebral artery occlusion.  LE venous doppler - Bilateral: No evidence of DVT, superficial thrombosis, or Baker's Cyst.  CUS - Bilateral: 1-39% ICA stenosis. Vertebral artery flow is antegrade.  TTE - - Left ventricle: Poorly visualized. The cavity size was normal. Wall thickness was increased in a pattern of moderate LVH. Systolic function  was normal. The estimated ejection fraction was in the range of 50% to 55%. Doppler parameters are consistent with abnormal left ventricular relaxation (grade 1 diastolic dysfunction).  Ct Angio Head and neck W Or Wo Contrast 03/16/2016 IMPRESSION: 1. Re- demonstration of findings of acute left PCA infarct. Chronic extensive right hemispheric encephalomalacia. 2. No intracranial arterial occlusion. 3. Severe stenosis of the proximal right intracranial carotid artery, measuring 80 and 90%, worsened from the prior study. This is secondary to large volume non calcified, eccentric atherosclerotic plaque. 4. Large amount of plaque vs chronic thrombus within the proximal left subclavian artery, slightly worsened compared to 03/17/2012. 5. Linear filling defect within the aortic arch is decreased compared to 03/17/2012.   Component     Latest Ref Rng & Units 03/14/2016  Cholesterol     0 - 200 mg/dL 161  Triglycerides     <150 mg/dL 096 (H)  HDL Cholesterol     >40 mg/dL 25 (L)  Total CHOL/HDL Ratio     RATIO 7.6  VLDL     0 - 40 mg/dL 51 (H)  LDL (calc)     0 - 99 mg/dL 045 (H)  Hemoglobin W0J     4.8 - 5.6 % 6.2 (H)  Mean Plasma Glucose     mg/dL 811    Assessment: As you may recall, he is a 66 y.o. Caucasian male with PMH of right MCA stroke in  2013, CAD/MI s/p cardiac stent, DM, atrophy of the right kidney, HLD, OSA, HTN, and seizures was admitted on 03/13/16 for acute left PCA infarct, and remote large right MCA infarct. MRA suspected right P2 occlusion or high-grade stenosis and suspected interval distal right vertebral artery occlusion. 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 Also right ICA high grade 80-90% stenosis, worsening than 2013 which was 60%. EF 50-55%, LDL 113 and A1C 6.3. His current PCA infarct was considered from subclavian A thrombus proximal to [facility] take off. His  old MCA infarct could be due to aortic arch near innominate artery or due to right ICA stenosis. He was put on coumadin for anticoagulation as well as lipitor. He was advised no driving on discharge. During the interval time, not able to tolerate statin including crestor and lipitor. As per wife, with crestor and lipitor, pt complained of joint pain and weakness. Once statin stopped, symptoms resolved. Tried to use PCSK but too high copay. He had hematuria 03/26/16 due to high INR 4.1 with UTI. Coumadin was on hold briefly and then resumed. Last INR 2.2. BP today 122/84.   Plan:  - continue coumadin for now, and INR goal 2-3. - will repeat CT angiogram to evaluate the thrombus and right ICA stenosis - check BP at home and record - Follow up with your primary care physician for stroke risk factor modification. Recommend maintain blood pressure goal <140/80, diabetes with hemoglobin A1c goal below 7.0% and lipids with LDL cholesterol goal below 70 mg/dL.  - healthy diet and home exercise - will check eligibility for repatha with less copay - consider driving rehab evaluation if interested.  - follow up in 4 months with me.  I spent more than 25 minutes of face to face time with the patient. Greater than 50% of time was spent in counseling and coordination of care. We discussed no driving, repeat CTA and working on repatha for HLD.    Orders Placed This Encounter  Procedures  . CT ANGIO NECK W OR WO CONTRAST    Standing Status:   Future    Standing Expiration Date:   08/14/2017    Order Specific Question:   If indicated for the ordered procedure, I authorize the administration of contrast media per Radiology protocol    Answer:   Yes    Order Specific Question:   Reason for Exam (SYMPTOM  OR DIAGNOSIS REQUIRED)    Answer:   aortic and subclavian thrombus follow up    Order Specific Question:   Preferred imaging location?    Answer:   Internal    No orders of the defined types were placed in  this encounter.   Patient Instructions  - continue coumadin for now, and INR goal 2-3. - will repeat CT angiogram to evaluate the clot - check BP at home and record - Follow up with your primary care physician for stroke risk factor modification. Recommend maintain blood pressure goal <140/80, diabetes with hemoglobin A1c goal below 7.0% and lipids with LDL cholesterol goal below 70 mg/dL.  - healthy diet and home exercise - will check if you can be eligible for injection medication for cholesterol - consider driving rehab evaluation if interested.  - follow up in 4 months with me.     Marvel Plan, MD PhD [facility] [address] [place], [place] [address] [phone]

## 2016-06-23 ENCOUNTER — Ambulatory Visit
Admission: RE | Admit: 2016-06-23 | Discharge: 2016-06-23 | Disposition: A | Discharge status: Home / Self Care | Payer: Medicare Other | Source: Ambulatory Visit | Attending: Neurology | Admitting: Neurology

## 2016-06-23 ENCOUNTER — Telehealth: Payer: Self-pay

## 2016-06-23 DIAGNOSIS — I639 Cerebral infarction, unspecified: Secondary | ICD-10-CM

## 2016-06-23 DIAGNOSIS — I82B12 Acute embolism and thrombosis of left subclavian vein: Secondary | ICD-10-CM | POA: Diagnosis not present

## 2016-06-23 MED ORDER — IOPAMIDOL (ISOVUE-370) INJECTION 76%
100.0000 mL | Freq: Once | INTRAVENOUS | Status: AC | PRN
  Administered 2016-06-23: 100 mL via INTRAVENOUS

## 2016-06-23 NOTE — Telephone Encounter (Signed)
Shaun Pugh with Dr. Carlena HurlZach Pugh's office returned nurses call in reference to Repatha.  Per Shaun Pugh their office has already approved medication, but patient has declined it due to the cost of $350.  Please call

## 2016-06-23 NOTE — Telephone Encounter (Signed)
[facility] number at [phone]. Rn needs cholesterol labs from the last 30 days. Rn left fax number of [phone] to fax current labs for last 30 days. Rn left vm if pt does not have any labs please call [facility] to notify us. Pt will have to come by [facility] to get current labs if possible.

## 2016-06-24 ENCOUNTER — Telehealth: Payer: Self-pay | Admitting: Neurology

## 2016-06-24 DIAGNOSIS — Z8673 Personal history of transient ischemic attack (TIA), and cerebral infarction without residual deficits: Secondary | ICD-10-CM

## 2016-06-24 DIAGNOSIS — I63432 Cerebral infarction due to embolism of left posterior cerebral artery: Secondary | ICD-10-CM

## 2016-06-24 MED ORDER — CLOPIDOGREL BISULFATE 75 MG PO TABS: 75.0000 mg | ORAL_TABLET | Freq: Every day | ORAL | 3 refills | Status: DC

## 2016-06-24 MED ORDER — ASPIRIN EC 325 MG PO TBEC: 325.0000 mg | DELAYED_RELEASE_TABLET | Freq: Every day | ORAL | 0 refills | Status: AC

## 2016-06-24 NOTE — Telephone Encounter (Signed)
Call patient, however, he was nonpalpable. His wife was available, I told his wife that his CTA neck test showed improved left subclavian artery thrombus, unchanged aortic arch carotid dissection and right ICA high-grade stenosis. The left subclavian artery thrombus was comparable with that in 2013.  Will DC Coumadin, put back on aspirin and Plavix, and will refer to vascular surgery for high-grade stenosis on the right ICA. Carotid Doppler at the time of stroke in 2013 showed right ICA 60% stenosis, however recent test showed right ICA 80-90% stenosis.   I have ordered aspirin OTC and Plavix to his pharmacy. Wife understood to stop Coumadin and start aspirin and Plavix.  Marvel PlanJindong Gianella Chismar, MD PhD Stroke Neurology 06/24/2016 5:46 PM  Orders Placed This Encounter  Procedures  . Ambulatory referral to Vascular Surgery    Referral Priority:   Routine    Referral Type:   Surgical    Referral Reason:   Specialty Services Required    Requested Specialty:   Vascular Surgery    Number of Visits Requested:   1

## 2016-06-30 NOTE — Telephone Encounter (Signed)
Rn call Dr. Margo AyeHall office to get patients last cholesterol panel labs. Rn spoke with a clinical staff at [phone] to get labs fax to [phone]. Labs will be fax in the next hour.

## 2016-06-30 NOTE — Telephone Encounter (Signed)
LEft vm for [employer] drug rep for repatha. Rn receive cholesterol labs from PCP dated 04/2016. Rn left message if labs are sufficient.

## 2016-07-02 NOTE — Telephone Encounter (Signed)
RE[facility]THA [facility] medication [facility]. Fax today. Waiting for response for approval or denial.

## 2016-07-21 DIAGNOSIS — E119 Type 2 diabetes mellitus without complications: Secondary | ICD-10-CM | POA: Diagnosis not present

## 2016-07-30 DIAGNOSIS — H47292 Other optic atrophy, left eye: Secondary | ICD-10-CM | POA: Diagnosis not present

## 2016-08-04 DIAGNOSIS — E1165 Type 2 diabetes mellitus with hyperglycemia: Secondary | ICD-10-CM | POA: Diagnosis not present

## 2016-08-11 NOTE — Telephone Encounter (Signed)
Called [facility] where [facility] was sent . Per [facility] Patient has to submit there own patient assistance. Called and spoke to patient wife and asked her did she have information that [facility] girl her to call. Patient's wife relayed she had information she did find and this was not a property right now for them. Patient's wife she would call when she could.

## 2016-08-11 NOTE — Telephone Encounter (Signed)
Late entry: [facility] was approve by [facility] for co payment of 374.93. With pts insurance he will have to apply financial assistance. See Annabelle Harman in patient referral/pt assistance program.

## 2016-08-13 ENCOUNTER — Encounter: Payer: Self-pay | Admitting: Surgery

## 2016-08-13 DIAGNOSIS — E782 Mixed hyperlipidemia: Secondary | ICD-10-CM | POA: Diagnosis not present

## 2016-08-13 DIAGNOSIS — E1165 Type 2 diabetes mellitus with hyperglycemia: Secondary | ICD-10-CM | POA: Diagnosis not present

## 2016-08-13 DIAGNOSIS — I10 Essential (primary) hypertension: Secondary | ICD-10-CM | POA: Diagnosis not present

## 2016-08-17 DIAGNOSIS — I10 Essential (primary) hypertension: Secondary | ICD-10-CM | POA: Diagnosis not present

## 2016-08-17 DIAGNOSIS — E782 Mixed hyperlipidemia: Secondary | ICD-10-CM | POA: Diagnosis not present

## 2016-08-17 DIAGNOSIS — I69359 Hemiplegia and hemiparesis following cerebral infarction affecting unspecified side: Secondary | ICD-10-CM | POA: Diagnosis not present

## 2016-08-17 DIAGNOSIS — E1165 Type 2 diabetes mellitus with hyperglycemia: Secondary | ICD-10-CM | POA: Diagnosis not present

## 2016-08-19 ENCOUNTER — Other Ambulatory Visit: Payer: Self-pay

## 2016-08-19 DIAGNOSIS — I6521 Occlusion and stenosis of right carotid artery: Secondary | ICD-10-CM

## 2016-08-19 DIAGNOSIS — E119 Type 2 diabetes mellitus without complications: Secondary | ICD-10-CM | POA: Diagnosis not present

## 2016-08-24 ENCOUNTER — Encounter: Payer: Self-pay | Admitting: Surgery

## 2016-08-24 ENCOUNTER — Ambulatory Visit (INDEPENDENT_AMBULATORY_CARE_PROVIDER_SITE_OTHER): Payer: Medicare Other | Admitting: Surgery

## 2016-08-24 ENCOUNTER — Ambulatory Visit (HOSPITAL_COMMUNITY)
Admission: RE | Admit: 2016-08-24 | Discharge: 2016-08-24 | Disposition: A | Discharge status: Home / Self Care | Payer: Medicare Other | Source: Ambulatory Visit | Attending: Surgery | Admitting: Surgery

## 2016-08-24 VITALS — BP 132/82 | HR 86 | Temp 97.0°F | Resp 20 | Ht 73.0 in | Wt 273.0 lb

## 2016-08-24 DIAGNOSIS — I6523 Occlusion and stenosis of bilateral carotid arteries: Secondary | ICD-10-CM | POA: Diagnosis not present

## 2016-08-24 DIAGNOSIS — I6521 Occlusion and stenosis of right carotid artery: Secondary | ICD-10-CM

## 2016-08-24 LAB — VAS US CAROTID
LCCADDIAS: 13 cm/s
LCCADSYS: 53 cm/s
LEFT ECA DIAS: -18 cm/s
LICADDIAS: -38 cm/s
LICADSYS: -93 cm/s
LICAPSYS: -74 cm/s
Left CCA prox dias: 18 cm/s
Left CCA prox sys: 97 cm/s
RIGHT CCA MID DIAS: 13 cm/s
RIGHT ECA DIAS: -20 cm/s
Right CCA prox dias: 13 cm/s
Right CCA prox sys: 100 cm/s
Right cca dist sys: -50 cm/s

## 2016-08-24 NOTE — Progress Notes (Signed)
Vascular and Vein Specialist of [facility]  Patient name: Shaun Pugh MRN: [id number] DOB: 1950/06/02 Sex: male   REFERRING PROVIDER:    Dr. Roda Shutters   REASON FOR CONSULT:    Carotid stenosis  HISTORY OF PRESENT ILLNESS:   Shaun Pugh is a 66 y.o. male, who is Referred today for evaluation of right carotid stenosis.  The patient had a right MCA stroke in 2013.  He suffered left-sided weakness and visual loss.  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  He was started on anticoagulation.  He recently has had evaluations of his right carotid artery which has shown progressive stenosis, now greater than 80%.  His Coumadin has been discontinued and now he is on aspirin and Plavix. He is a former smoker. The patient suffers hypercholesterolemia.  He has been intolerant of statin therapy.  He has a history of coronary artery disease, having undergone stenting in the past  PAST MEDICAL HISTORY    Past Medical History:  Diagnosis Date  . Acute pancreatitis 09/14/2011  . Atrophy of right kidney 09/15/2011  . CAD (coronary artery disease)    HX OF TWO HEART STENTS PLACED APPROX 10 OR 11 YRS AGO- STATES RELEASED BY CARDIOLOGIST- NO LONGER SEES  . Chronic back pain   . Diabetes (HCC)   . High cholesterol   . Hypertension   . Kidney calculi 09/15/2011  . Myocardial infarction (HCC)   . Seizures (HCC)    AFTER THE STROKE - BUT NONE SINCE  . Sleep apnea    STATES TRIED CPAP - DID NOT TOLERATE - RETURNED CPAP - DOES NOT HAVE ANYMORE  . Stented coronary artery approx 2004  . Stroke (HCC) 03/18/2012   LEFT SIDED WEAKNESS - STILL HAS SOME LUE>LLE WEAKNESS - ABLE TO AMBULATE WITH OR WITHOUT CANE BUT DOES NOT HAVE USE OF LEFT ARM  . TIA (transient ischemic attack)      FAMILY HISTORY   Family History  Problem Relation Age of Onset  . Congestive Heart Failure Mother 48  . Diabetes Father 27  . Heart attack Father   .  Anesthesia problems Neg Hx   . Hypotension Neg Hx   . Malignant hyperthermia Neg Hx   . Pseudochol deficiency Neg Hx     SOCIAL HISTORY:   Social History   Social History  . Marital status: Married    Spouse name: N/A  . Number of children: N/A  . Years of education: N/A   Occupational History  . Not on file.   Social History Main Topics  . Smoking status: Former Smoker    Packs/day: 1.00    Years: 45.00    Types: Cigarettes    Quit date: 03/18/2012  . Smokeless tobacco: Never Used  . Alcohol use No  . Drug use: No  . Sexual activity: Yes    Birth control/ protection: None   Other Topics Concern  . Not on file   Social History Narrative  . No narrative on file    ALLERGIES:    Allergies  Allergen Reactions  . Crestor [Rosuvastatin Calcium]     Joint pain and weakness   . Lipitor [Atorvastatin]     Joint pain and weakness    CURRENT MEDICATIONS:    Current Outpatient Prescriptions  Medication Sig Dispense Refill  . aspirin EC 325 MG tablet Take 1 tablet (325 mg total) by mouth daily. 90 tablet 0  . calcium-vitamin D (OSCAL  WITH D) 500-200 MG-UNIT tablet Take 1 tablet by mouth daily with breakfast.    . clobetasol cream (TEMOVATE) 0.05 % Apply 1 application topically 2 (two) times daily.    . clopidogrel (PLAVIX) 75 MG tablet Take 1 tablet (75 mg total) by mouth daily. 90 tablet 3  . escitalopram (LEXAPRO) 5 MG tablet Take 1 tablet by mouth daily.  2  . fluticasone (CUTIVATE) 0.05 % cream Apply topically 2 (two) times daily.    Marland Kitchen glipiZIDE (GLUCOTROL XL) 10 MG 24 hr tablet Take 10 mg by mouth daily with breakfast.    . Insulin Glargine (TOUJEO SOLOSTAR) 300 UNIT/ML SOPN Inject 51 Units into the skin.    Marland Kitchen lisinopril-hydrochlorothiazide (PRINZIDE,ZESTORETIC) 10-12.5 MG tablet Take 1 tablet by mouth daily.  0  . Omega-3 Fatty Acids (FISH OIL) 1000 MG CAPS Take 1 capsule by mouth daily.    . pantoprazole (PROTONIX) 40 MG tablet Take 1 tablet by mouth daily.   2  . vitamin B-12 (CYANOCOBALAMIN) 1000 MCG tablet Take 1,000 mcg by mouth daily.     No current facility-administered medications for this visit.     REVIEW OF SYSTEMS:    denotes positive finding,  denotes negative finding Cardiac  Comments:  Chest pain or chest pressure:    Shortness of breath upon exertion: x   Short of breath when lying flat:    Irregular heart rhythm:        Vascular    Pain in calf, thigh, or hip brought on by ambulation: x   Pain in feet at night that wakes you up from your sleep:     Blood clot in your veins:    Leg swelling:         Pulmonary    Oxygen at home:    Productive cough:     Wheezing:         Neurologic    Sudden weakness in arms or legs:  x   Sudden numbness in arms or legs:  x   Sudden onset of difficulty speaking or slurred speech:    Temporary loss of vision in one eye:  x   Problems with dizziness:  x       Gastrointestinal    Blood in stool:      Vomited blood:         Genitourinary    Burning when urinating:     Blood in urine:        Psychiatric    Major depression:         Hematologic    Bleeding problems:    Problems with blood clotting too easily:        Skin    Rashes or ulcers:        Constitutional    Fever or chills:     PHYSICAL EXAM:   Vitals:   08/24/16 1208 08/24/16 1211  BP: 130/82 132/82  Pulse: 86   Resp: 20   Temp: 97 F (36.1 C)   TempSrc: Oral   SpO2: 95%   Weight: 273 lb (123.8 kg)   Height:  (1.854 m)     GENERAL: The patient is a well-nourished male, in no acute distress. The vital signs are documented above. CARDIAC: There is a regular rate and rhythm.  VASCULAR: No carotid bruits PULMONARY: Nonlabored respirations ABDOMEN: Soft and non-tender.  MUSCULOSKELETAL: There are no major deformities or cyanosis. NEUROLOGIC: Left-sided weakness SKIN: There are no ulcers or rashes noted. PSYCHIATRIC: The  patient has a normal affect.  STUDIES:   I have reviewed his  carotid Doppler studies which showed greater than 80% right carotid stenosis and less than 39% left carotid stenosis.  I have also reviewed his CT angiogram which does reveal that the lesion is accessible  ASSESSMENT and PLAN   Symptomatic right carotid stenosis: I discussed proceeding with right carotid endarterectomy.  Because of his aortic thrombus, I do not think he is a good candidate for carotid stenting.  We discussed the risks and benefits of carotid endarterectomy including the risk of stroke, nerve injury, bleeding, and cardiopulmonary complications.  I will not stop his Plavix and aspirin prior to his operation.  I am sending him to get formal cardiac testing.  I have placed him on the schedule for Friday April 27   Durene Cal, MD [facility] and Vein Specialists of [facility] [phone] Pager [phone]

## 2016-08-26 ENCOUNTER — Encounter: Payer: Self-pay | Admitting: Cardiology

## 2016-08-26 NOTE — Progress Notes (Signed)
Cardiology Office Note  Date: 08/27/2016   ID: Shaun Pugh, DOB [birth year], MRN [id number]  PCP: [name], MD  Consulting Cardiologist: Nona Dell, MD   Chief Complaint  Patient presents with  . Preoperative evaluation    History of Present Illness: Shaun Pugh is a 66 y.o. male referred for cardiology consultation by Dr. Myra Gianotti for preoperative assessment. He has been scheduled for elective right carotid endarterectomy on April 27. I reviewed extensive records and updated his chart. He is here today with his wife. Cardiac history is outlined below. He previously followed by Dr. Amil Amen, has had no regular cardiology testing for several years.  He is limited at this time due to recent strokes with residual left-sided weakness, uses a cane. He also has some visual deficits related to his most recent stroke. He does not endorse any significant angina symptoms on medical therapy. I reviewed his most recent ECG which is outlined below.  Current cardiac regimen includes aspirin, Plavix, Prinzide, and more recently added Pravachol. Lipids have been followed by Dr. [name].  Today we discussed scheduling a follow-up Lexiscan Myoview to assess overall ischemic burden.  Past Medical History:  Diagnosis Date  . Acute pancreatitis 09/14/2011  . Atrophy of right kidney 09/15/2011  . CAD (coronary artery disease)    DES to mid LAD and distal RCA 2005 - Dr. Amil Amen  . Carotid artery disease (HCC)   . Chronic back pain   . Essential hypertension   . History of stroke 03/18/2012   Aortic atheroma implicated, was on Coumadin for a period of time and more recently aspirin and Plavix  . Hyperlipidemia   . Kidney calculi 09/15/2011  . Myocardial infarction (HCC)   . Seizures (HCC)    Following stroke  . Sleep apnea    Reportedly intolerant of CPAP  . Type 2 diabetes mellitus (HCC)     Past Surgical History:  Procedure Laterality Date  . BACK SURGERY     LUMBAR SURGERIES x 4 - INCLUDING  FUSIONS  . CARPAL TUNNEL REPAIR Left   . CHOLECYSTECTOMY  09/25/2011   Procedure: LAPAROSCOPIC CHOLECYSTECTOMY;  Surgeon: Fabio Bering, MD;  Location: [facility];  Service: General;  Laterality: N/A;  . CYSTOSCOPY WITH RETROGRADE PYELOGRAM, URETEROSCOPY AND STENT PLACEMENT Right 05/26/2013   Procedure: CYSTOSCOPY, RIGHT RETROGRADE PYELOGRAM, RIGHT URETEROSCOPY AND RIGHT STENT PLACEMENT, stone extraction, biopsy;  Surgeon: Crist Fat, MD;  Location: [facility];  Service: Urology;  Laterality: Right;  . HERNIA REPAIR     INGUINAL HERNIA REPARI MAYBE 30 YRS AGO  . MULTIPLE TOOTH EXTRACTIONS      Current Outpatient Prescriptions  Medication Sig Dispense Refill  . aspirin EC 325 MG tablet Take 1 tablet (325 mg total) by mouth daily. 90 tablet 0  . calcium-vitamin D (OSCAL WITH D) 500-200 MG-UNIT tablet Take 1 tablet by mouth daily with breakfast.    . clobetasol cream (TEMOVATE) 0.05 % Apply 1 application topically 2 (two) times daily.    . clopidogrel (PLAVIX) 75 MG tablet Take 1 tablet (75 mg total) by mouth daily. 90 tablet 3  . escitalopram (LEXAPRO) 5 MG tablet Take 1 tablet by mouth daily.  2  . fluticasone (CUTIVATE) 0.05 % cream Apply topically 2 (two) times daily.    Marland Kitchen glipiZIDE (GLUCOTROL XL) 10 MG 24 hr tablet Take 10 mg by mouth daily with breakfast.    . Insulin Glargine (TOUJEO SOLOSTAR) 300 UNIT/ML SOPN Inject 51 Units into the skin.    Marland Kitchen  lisinopril-hydrochlorothiazide (PRINZIDE,ZESTORETIC) 10-12.5 MG tablet Take 1 tablet by mouth daily.  0  . Omega-3 Fatty Acids (FISH OIL) 1000 MG CAPS Take 1 capsule by mouth daily.    . pantoprazole (PROTONIX) 40 MG tablet Take 1 tablet by mouth daily.  2  . pravastatin (PRAVACHOL) 20 MG tablet Take 20 mg by mouth daily.   0  . vitamin B-12 (CYANOCOBALAMIN) 1000 MCG tablet Take 1,000 mcg by mouth daily.     No current facility-administered medications for this visit.    Allergies:  Crestor [rosuvastatin calcium] and Lipitor [atorvastatin]    Social History: The patient  reports that he quit smoking about 4 years ago. His smoking use included Cigarettes. He has a 45.00 pack-year smoking history. He has never used smokeless tobacco. He reports that he does not drink alcohol or use drugs.   Family History: The patient's family history includes Congestive Heart Failure (age of onset: 5) in his mother; Diabetes (age of onset: 102) in his father; Heart attack in his father.   ROS:  Please see the history of present illness. Otherwise, complete review of systems is positive for left-sided weakness and decreased range of motion particularly of the left arm.  All other systems are reviewed and negative.   Physical Exam: VS:  BP 124/80 (BP Location: Right Arm)   Pulse 89   Ht 6' (1.829 m)   Wt 272 lb (123.4 kg)   SpO2 97%   BMI 36.89 kg/m , BMI Body mass index is 36.89 kg/m.  Wt Readings from Last 3 Encounters:  08/27/16 272 lb (123.4 kg)  08/24/16 273 lb (123.8 kg)  06/16/16 274 lb 12.8 oz (124.6 kg)    General: Obese male, appears comfortable at rest. Using a cane to walk. HEENT: Conjunctiva and lids normal, oropharynx clear. Neck: Supple, no elevated JVP, right carotid bruit, no thyromegaly. Lungs: Clear to auscultation, nonlabored breathing at rest. Cardiac: Regular rate and rhythm, no S3 or significant systolic murmur, no pericardial rub. Abdomen: Obese, nontender, bowel sounds present, no guarding or rebound. Extremities: Trace ankle edema, distal pulses 2+. Skin: Warm and dry. Musculoskeletal: No kyphosis. Neuropsychiatric: Alert and oriented x3, left sided weakness and contracture of the left arm.  ECG: I personally reviewed the tracing from 03/13/2016 which showed sinus rhythm with right bundle branch block.  Recent Labwork: 03/13/2016: ALT 33; AST 23 03/25/2016: BUN 21; Creatinine, Ser 1.44; Hemoglobin 13.7; Platelets 293; Potassium 4.0; Sodium 132     Component Value Date/Time   CHOL 189 03/14/2016 0106   TRIG  255 (H) 03/14/2016 0106   HDL 25 (L) 03/14/2016 0106   CHOLHDL 7.6 03/14/2016 0106   VLDL 51 (H) 03/14/2016 0106   LDLCALC 113 (H) 03/14/2016 0106    Other Studies Reviewed Today:  Carotid Dopplers 08/24/2016: 80-99% RICA stenosis and 1-39% LICA stenosis.  Echocardiogram 03/16/2016: Study Conclusions  - Left ventricle: Poorly visualized. The cavity size was normal.   Wall thickness was increased in a pattern of moderate LVH.   Systolic function was normal. The estimated ejection fraction was   in the range of 50% to 55%. Doppler parameters are consistent   with abnormal left ventricular relaxation (grade 1 diastolic   dysfunction).  Assessment and Plan:  1. CAD status post DES to the mid LAD and distal RCA by Dr. Amil Amen back in 2005. Patient does not report any active angina symptoms at this time. He is being considered for elective right carotid endarterectomy with Dr. Myra Gianotti next week. Plan  is to obtain a Lexiscan Myoview to better assess ischemic burden and make final recommendations regarding preoperative cardiac assessment.  2. History of stroke with residual left-sided weakness. He is on aspirin and Plavix. Has history of aortic atheroma and also progressive carotid artery disease. No obvious atrial arrhythmias.  3. Hyperlipidemia, recently placed on Pravachol by Dr. [name].  4. Essential hypertension, systolic blood pressure in the 120s today. No changes made present regimen.  Current medicines were reviewed with the patient today.   Orders Placed This Encounter  Procedures  . NM Myocar Multi W/Spect W/Wall Motion / EF    Disposition: Follow-up on stress testing next week with final recommendations.  Signed, Jonelle Sidle, MD, [facility] 08/27/2016 3:12 PM    [facility] at [facility] [address] [place], [place] [address] Phone: [phone]; Fax: [phone]

## 2016-08-27 ENCOUNTER — Encounter: Payer: Self-pay | Admitting: Cardiology

## 2016-08-27 ENCOUNTER — Ambulatory Visit (INDEPENDENT_AMBULATORY_CARE_PROVIDER_SITE_OTHER): Payer: Medicare Other | Admitting: Cardiology

## 2016-08-27 VITALS — BP 124/80 | HR 89 | Ht 72.0 in | Wt 272.0 lb

## 2016-08-27 DIAGNOSIS — E782 Mixed hyperlipidemia: Secondary | ICD-10-CM | POA: Diagnosis not present

## 2016-08-27 DIAGNOSIS — Z01810 Encounter for preprocedural cardiovascular examination: Secondary | ICD-10-CM

## 2016-08-27 DIAGNOSIS — I2510 Atherosclerotic heart disease of native coronary artery without angina pectoris: Secondary | ICD-10-CM

## 2016-08-27 DIAGNOSIS — Z8673 Personal history of transient ischemic attack (TIA), and cerebral infarction without residual deficits: Secondary | ICD-10-CM | POA: Diagnosis not present

## 2016-08-27 DIAGNOSIS — I10 Essential (primary) hypertension: Secondary | ICD-10-CM | POA: Diagnosis not present

## 2016-08-27 NOTE — Patient Instructions (Signed)
Your physician wants you to follow-up in: 1 year You will receive a reminder letter in the mail two months in advance. If you don't receive a letter, please call our office to schedule the follow-up appointment.   Your physician recommends that you continue on your current medications as directed. Please refer to the Current Medication list given to you today.    Your physician has requested that you have a lexiscan myoview. For further information please visit [email or web address]. Please follow instruction sheet, as given.     Thank you for choosing [facility] !        

## 2016-08-28 ENCOUNTER — Other Ambulatory Visit: Payer: Self-pay

## 2016-08-28 ENCOUNTER — Encounter (HOSPITAL_COMMUNITY)
Admission: RE | Admit: 2016-08-28 | Discharge: 2016-08-28 | Disposition: A | Discharge status: Home / Self Care | Payer: Medicare Other | Source: Ambulatory Visit | Attending: Cardiology | Admitting: Cardiology

## 2016-08-28 ENCOUNTER — Encounter (HOSPITAL_BASED_OUTPATIENT_CLINIC_OR_DEPARTMENT_OTHER)
Admission: RE | Admit: 2016-08-28 | Discharge: 2016-08-28 | Disposition: A | Discharge status: Home / Self Care | Payer: Medicare Other | Source: Ambulatory Visit | Attending: Cardiology | Admitting: Cardiology

## 2016-08-28 DIAGNOSIS — I2510 Atherosclerotic heart disease of native coronary artery without angina pectoris: Secondary | ICD-10-CM | POA: Diagnosis not present

## 2016-08-28 DIAGNOSIS — Z01810 Encounter for preprocedural cardiovascular examination: Secondary | ICD-10-CM

## 2016-08-28 LAB — NM MYOCAR MULTI W/SPECT W/WALL MOTION / EF
CHL CUP NUCLEAR SDS: 4
CHL CUP RESTING HR STRESS: 79 {beats}/min
CSEPPHR: 98 {beats}/min
LV dias vol: 58 mL (ref 62–150)
LVSYSVOL: 14 mL
NUC STRESS TID: 0.89
RATE: 0.33
SRS: 3
SSS: 7

## 2016-08-28 MED ORDER — REGADENOSON 0.4 MG/5ML IV SOLN
INTRAVENOUS | Status: AC
  Administered 2016-08-28: 0.4 mg via INTRAVENOUS
  Filled 2016-08-28: qty 5

## 2016-08-28 MED ORDER — TECHNETIUM TC 99M TETROFOSMIN IV KIT
30.0000 | PACK | Freq: Once | INTRAVENOUS | Status: AC | PRN
  Administered 2016-08-28: 28.8 via INTRAVENOUS

## 2016-08-28 MED ORDER — SODIUM CHLORIDE 0.9% FLUSH
INTRAVENOUS | Status: AC
  Administered 2016-08-28: 10 mL via INTRAVENOUS
  Filled 2016-08-28: qty 10

## 2016-08-28 MED ORDER — TECHNETIUM TC 99M TETROFOSMIN IV KIT
10.0000 | PACK | Freq: Once | INTRAVENOUS | Status: AC | PRN
  Administered 2016-08-28: 11 via INTRAVENOUS

## 2016-09-01 ENCOUNTER — Encounter (HOSPITAL_COMMUNITY)
Admission: RE | Admit: 2016-09-01 | Discharge: 2016-09-01 | Disposition: A | Discharge status: Home / Self Care | Payer: Medicare Other | Source: Ambulatory Visit | Attending: Surgery | Admitting: Surgery

## 2016-09-01 ENCOUNTER — Other Ambulatory Visit (HOSPITAL_COMMUNITY): Payer: Self-pay

## 2016-09-01 DIAGNOSIS — Z794 Long term (current) use of insulin: Secondary | ICD-10-CM

## 2016-09-01 DIAGNOSIS — E669 Obesity, unspecified: Secondary | ICD-10-CM

## 2016-09-01 DIAGNOSIS — E119 Type 2 diabetes mellitus without complications: Secondary | ICD-10-CM

## 2016-09-01 DIAGNOSIS — G4733 Obstructive sleep apnea (adult) (pediatric): Secondary | ICD-10-CM

## 2016-09-01 DIAGNOSIS — I10 Essential (primary) hypertension: Secondary | ICD-10-CM | POA: Insufficient documentation

## 2016-09-01 DIAGNOSIS — E785 Hyperlipidemia, unspecified: Secondary | ICD-10-CM | POA: Insufficient documentation

## 2016-09-01 DIAGNOSIS — I6521 Occlusion and stenosis of right carotid artery: Secondary | ICD-10-CM

## 2016-09-01 DIAGNOSIS — Z6836 Body mass index (BMI) 36.0-36.9, adult: Secondary | ICD-10-CM

## 2016-09-01 DIAGNOSIS — I2510 Atherosclerotic heart disease of native coronary artery without angina pectoris: Secondary | ICD-10-CM

## 2016-09-01 DIAGNOSIS — Z7902 Long term (current) use of antithrombotics/antiplatelets: Secondary | ICD-10-CM | POA: Insufficient documentation

## 2016-09-01 DIAGNOSIS — Z9049 Acquired absence of other specified parts of digestive tract: Secondary | ICD-10-CM

## 2016-09-01 DIAGNOSIS — Z8673 Personal history of transient ischemic attack (TIA), and cerebral infarction without residual deficits: Secondary | ICD-10-CM

## 2016-09-01 DIAGNOSIS — Z7901 Long term (current) use of anticoagulants: Secondary | ICD-10-CM | POA: Diagnosis not present

## 2016-09-01 DIAGNOSIS — E1151 Type 2 diabetes mellitus with diabetic peripheral angiopathy without gangrene: Secondary | ICD-10-CM | POA: Diagnosis not present

## 2016-09-01 DIAGNOSIS — Z87891 Personal history of nicotine dependence: Secondary | ICD-10-CM

## 2016-09-01 DIAGNOSIS — I7410 Embolism and thrombosis of unspecified parts of aorta: Secondary | ICD-10-CM | POA: Diagnosis not present

## 2016-09-01 DIAGNOSIS — I252 Old myocardial infarction: Secondary | ICD-10-CM

## 2016-09-01 DIAGNOSIS — Z7982 Long term (current) use of aspirin: Secondary | ICD-10-CM | POA: Insufficient documentation

## 2016-09-01 DIAGNOSIS — Z955 Presence of coronary angioplasty implant and graft: Secondary | ICD-10-CM | POA: Insufficient documentation

## 2016-09-01 DIAGNOSIS — E7800 Pure hypercholesterolemia, unspecified: Secondary | ICD-10-CM | POA: Diagnosis not present

## 2016-09-01 DIAGNOSIS — Z01818 Encounter for other preprocedural examination: Secondary | ICD-10-CM

## 2016-09-01 DIAGNOSIS — I69354 Hemiplegia and hemiparesis following cerebral infarction affecting left non-dominant side: Secondary | ICD-10-CM | POA: Diagnosis not present

## 2016-09-01 LAB — COMPREHENSIVE METABOLIC PANEL
ALBUMIN: 3.8 g/dL (ref 3.5–5.0)
ALK PHOS: 65 U/L (ref 38–126)
ALT: 34 U/L (ref 17–63)
AST: 30 U/L (ref 15–41)
Anion gap: 9 (ref 5–15)
BUN: 20 mg/dL (ref 6–20)
CALCIUM: 9 mg/dL (ref 8.9–10.3)
CO2: 19 mmol/L — AB (ref 22–32)
CREATININE: 1.48 mg/dL — AB (ref 0.61–1.24)
Chloride: 109 mmol/L (ref 101–111)
GFR calc Af Amer: 55 mL/min — ABNORMAL LOW (ref >60)
GFR calc non Af Amer: 48 mL/min — ABNORMAL LOW (ref >60)
GLUCOSE: 298 mg/dL — AB (ref 65–99)
Potassium: 4.5 mmol/L (ref 3.5–5.1)
Sodium: 137 mmol/L (ref 135–145)
Total Bilirubin: 0.5 mg/dL (ref 0.3–1.2)
Total Protein: 8 g/dL (ref 6.5–8.1)

## 2016-09-01 LAB — PROTIME-INR
INR: 1.05
Prothrombin Time: 13.7 seconds (ref 11.4–15.2)

## 2016-09-01 LAB — CBC
HCT: 38.9 % — ABNORMAL LOW (ref 39.0–52.0)
HEMOGLOBIN: 13.3 g/dL (ref 13.0–17.0)
MCH: 30.4 pg (ref 26.0–34.0)
MCHC: 34.2 g/dL (ref 30.0–36.0)
MCV: 89 fL (ref 78.0–100.0)
Platelets: 215 10*3/uL (ref 150–400)
RBC: 4.37 MIL/uL (ref 4.22–5.81)
RDW: 14 % (ref 11.5–15.5)
WBC: 11.4 10*3/uL — AB (ref 4.0–10.5)

## 2016-09-01 LAB — GLUCOSE, CAPILLARY: GLUCOSE-CAPILLARY: 264 mg/dL — AB (ref 65–99)

## 2016-09-01 LAB — TYPE AND SCREEN
ABO/RH(D): O NEG
Antibody Screen: NEGATIVE

## 2016-09-01 LAB — APTT: aPTT: 29 seconds (ref 24–36)

## 2016-09-01 LAB — SURGICAL PCR SCREEN
MRSA, PCR: NEGATIVE
Staphylococcus aureus: NEGATIVE

## 2016-09-01 LAB — ABO/RH: ABO/RH(D): O NEG

## 2016-09-01 NOTE — Pre-Procedure Instructions (Signed)
Shaun Pugh  09/01/2016      [facility] - [place], [place] - [address] [place] [address] Phone: [phone] Fax: [phone]  [id number] - [place], [place] - [address] AT [facility] [address] [place] [address] Phone: [phone] Fax: [phone]  [facility] [address], [place] - [address] [place] [address] [place] [address] [place] [address] Phone: [phone] Fax: [phone]    Your procedure is scheduled on April 27.  Report to [facility] Admitting at 5:30 A.M.  Call this number if you have problems the morning of surgery:  [phone]   Remember:  Do not eat food or drink liquids after midnight.  Take these medicines the morning of surgery with A SIP OF WATER : escitalopram (LEXAPRO), pantoprazole (PROTONIX)    STOP aspirin, herbal medications, vitamins, fish oil  PLAVIX as directed      How to Manage Your Diabetes Before and After Surgery  Why is it important to control my blood sugar before and after surgery? . Improving blood sugar levels before and after surgery helps healing and can limit problems. . A way of improving blood sugar control is eating a healthy diet by: o  Eating less sugar and carbohydrates o  Increasing activity/exercise o  Talking with your doctor about reaching your blood sugar goals . High blood sugars (greater than 180 mg/dL) can raise your risk of infections and slow your recovery, so you will need to focus on controlling your diabetes during the weeks before surgery. . Make sure that the doctor who takes care of your diabetes knows about your planned surgery including the date and location.  How do I manage my blood sugar before surgery? . Check your blood sugar at least 4 times a day, starting 2 days before surgery, to make sure that the level is not too high or low. o Check your blood sugar the morning of your surgery when you wake up  and every 2 hours until you get to the Short Stay unit. . If your blood sugar is less than 70 mg/dL, you will need to treat for low blood sugar: o Do not take insulin. o Treat a low blood sugar (less than 70 mg/dL) with  cup of clear juice (cranberry or apple), 4 glucose tablets, OR glucose gel. o Recheck blood sugar in 15 minutes after treatment (to make sure it is greater than 70 mg/dL). If your blood sugar is not greater than 70 mg/dL on recheck, call [phone] for further instructions. . Report your blood sugar to the short stay nurse when you get to Short Stay.  . If you are admitted to the hospital after surgery: o Your blood sugar will be checked by the staff and you will probably be given insulin after surgery (instead of oral diabetes medicines) to make sure you have good blood sugar levels. o The goal for blood sugar control after surgery is 80-180 mg/dL.      WHAT DO I DO ABOUT MY DIABETES MEDICATION?   Marland Kitchen Do not take oral diabetes medicines (pills) the morning of surgery.  . THE NIGHT BEFORE SURGERY, take  29  units of lantus insulin.      . The day of surgery, do not take other diabetes injectables, including Byetta (exenatide), Bydureon (exenatide ER), Victoza (liraglutide), or Trulicity (dulaglutide).  . If your CBG is greater than 220 mg/dL, you may take  of your sliding  scale (correction) dose of insulin.  Other Instructions:          Patient Signature:  Date:   Nurse Signature:  Date:   Reviewed and Endorsed by [facility] Patient Education Committee, August 2015   Do not wear jewelry, make-up or nail polish.  Do not wear lotions, powders, or perfumes, or deoderant.  Do not shave 48 hours prior to surgery.  Men may shave face and neck.  Do not bring valuables to the hospital.  [facility] is not responsible for any belongings or valuables.  Contacts, dentures or bridgework may not be worn into surgery.  Leave your suitcase in the car.  After surgery  it may be brought to your room.  For patients admitted to the hospital, discharge time will be determined by your treatment team.  Patients discharged the day of surgery will not be allowed to drive home.   Name and phone number of your driver Special instructions:  "Preparing for surgery"  Please read over the following fact sheets that you were given. Pain Booklet, Coughing and Deep Breathing and Surgical Site Infection Prevention

## 2016-09-02 LAB — HEMOGLOBIN A1C
HEMOGLOBIN A1C: 8.6 % — AB (ref 4.8–5.6)
Mean Plasma Glucose: 200 mg/dL

## 2016-09-02 NOTE — Progress Notes (Signed)
Anesthesia Chart Review: Patient is a 66 year old male scheduled for right carotid endarterectomy on 09/04/16 by Dr. Myra Gianotti.  History includes former smoker (quit '13), CAD/MI s/p DES LAD and RCA '05, OSA (intolerant to CPAP), HLD, DM2, HTN, right MCA CVA (with associated seizure) '13 and left PCA infarct 03/13/16, back surgery, chronic back pain, atrophic right kidney, right ureteroscopy/stent 05/26/13, cholecystectomy 09/25/11. BMI is consistent with obesity.  - PCP is Dr. [name]. - He was referred to cardiologist Dr. Nona Dell for preoperative evaluation and seen on 08/27/16. He had previously been followed by Dr. Amil Amen, but had not been seen in many years. He was cleared at intermediate risk following recent stress test. - Neurologist is Dr. Marvel Plan, last visit 06/16/16. He wrote, "His current PCA infarct was considered from subclavian A thrombus proximal to [facility] take off. His old MCA infarct could be due to aortic arch near innominate artery or due to right ICA stenosis. He was put on coumadin for anticoagulation as well as lipitor. He was advised no driving on discharge."  Meds include aspirin 325 mg, Plavix, Lexapro, glipizide, insulin glargine, lisinopril-HCTZ, fish oil, Protonix, pravastatin. He is to continue ASA and Plavix per [facility].  BP 110/73   Pulse 90   Temp 36.6 C   Resp 20   Ht 6' (1.829 m)   Wt 272 lb (123.4 kg)   SpO2 97%   BMI 36.89 kg/m   EKG 03/13/16: SR, right BBB.  Nuclear stress test 08/28/16:  There was no ST segment deviation noted during stress.  Findings consistent with mild apical ischemia and mild to moderate basal inferior ischemia.  This is an intermediate risk study. Combined fairly moderate area of myocardium at currently at jeopardy without high risk findings.  The left ventricular ejection fraction is hyperdynamic (>65%). Per Dr. Diona Browner on 08/29/16, "Results reviewed. Intermediate risk stress test with mild apical and mild to moderate basal  inferior ischemic zones. LVEF normal. In the absence of angina on medical therapy, he should be able to proceed with planned carotid endarterectomy with Dr. Myra Gianotti at an intermediate perioperative cardiac risk."  Echo 03/16/16: Study Conclusions - Left ventricle: Poorly visualized. The cavity size was normal.   Wall thickness was increased in a pattern of moderate LVH.   Systolic function was normal. The estimated ejection fraction was   in the range of 50% to 55%. Doppler parameters are consistent   with abnormal left ventricular relaxation (grade 1 diastolic   dysfunction).  Cardiac cath 12/05/03:  ANGIOGRAPHY:  As mentioned, the lesions treated were in the mid LAD and  distal RCA.  The distal RCA lesion was diffuse and involved a 30%-40%  stenosis followed shortly by a 90% stenosis.  The entire lesion was covered  with the 3.5/28 mm Cypher.  Following balloon dilatation and stent  implantation, there was no residual stenosis.  In the left anterior  descending artery, the lesion was tubular to diffuse in the midportion of  the vessel.  There was diffuse disease in the proximal portion of the vessel  but no greater than 30%.  Following balloon dilatation and stent  implantation, there was no residual stenosis in the LAD either.  Excellent  TIMI-III flow was present following both stent implantations. FINAL IMPRESSION:  1. Atherosclerotic coronary vascular disease, two vessel.  2. Status post successful percutaneous coronary intervention and drug-     eluting stent implantation distal right coronary artery and mid left     anterior descending.  3. Typical angina was reproduced with device insertion and balloon     inflation.  CTA neck 06/23/16: IMPRESSION: - Thrombus in the proximal left subclavian artery has improved. There remains plaque at the origin of left subclavian artery with mild stenosis - Irregular plaque in the right carotid bulb is unchanged. This is narrowing the lumen  by approximately 80% diameter stenosis - Mild atherosclerotic disease left carotid bifurcation without significant carotid stenosis - Both vertebral arteries are patent without significant stenosis. 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 - Short segment linear filling defect proximal aortic arch is unchanged, possible dissection.  Preoperative labs noted. Cr 1.48, stable when compared to 03/2016 labs.  A1c 8.6. He will get a fasting CBG on arrival.   Velna Ochs [facility] Short Stay Center/Anesthesiology Phone [phone] 09/02/2016 1:21 PM

## 2016-09-04 ENCOUNTER — Encounter (HOSPITAL_COMMUNITY)
Admission: RE | Disposition: A | Discharge status: Home / Self Care | Payer: Self-pay | Source: Ambulatory Visit | Attending: Surgery

## 2016-09-04 ENCOUNTER — Inpatient Hospital Stay (HOSPITAL_COMMUNITY): Payer: Medicare Other | Admitting: Certified Registered Nurse Anesthetist

## 2016-09-04 ENCOUNTER — Encounter (HOSPITAL_COMMUNITY): Payer: Self-pay | Admitting: *Deleted

## 2016-09-04 ENCOUNTER — Inpatient Hospital Stay (HOSPITAL_COMMUNITY)
Admission: RE | Admit: 2016-09-04 | Discharge: 2016-09-06 | DRG: 038 | Disposition: A | Discharge status: Home / Self Care | Payer: Medicare Other | Source: Ambulatory Visit | Attending: Surgery | Admitting: Surgery

## 2016-09-04 ENCOUNTER — Inpatient Hospital Stay (HOSPITAL_COMMUNITY): Payer: Medicare Other | Admitting: Vascular Surgery

## 2016-09-04 DIAGNOSIS — I252 Old myocardial infarction: Secondary | ICD-10-CM

## 2016-09-04 DIAGNOSIS — E7800 Pure hypercholesterolemia, unspecified: Secondary | ICD-10-CM | POA: Diagnosis present

## 2016-09-04 DIAGNOSIS — R2681 Unsteadiness on feet: Secondary | ICD-10-CM | POA: Diagnosis present

## 2016-09-04 DIAGNOSIS — Z955 Presence of coronary angioplasty implant and graft: Secondary | ICD-10-CM | POA: Diagnosis not present

## 2016-09-04 DIAGNOSIS — Z833 Family history of diabetes mellitus: Secondary | ICD-10-CM

## 2016-09-04 DIAGNOSIS — I6521 Occlusion and stenosis of right carotid artery: Secondary | ICD-10-CM | POA: Diagnosis not present

## 2016-09-04 DIAGNOSIS — E669 Obesity, unspecified: Secondary | ICD-10-CM | POA: Diagnosis present

## 2016-09-04 DIAGNOSIS — Z7902 Long term (current) use of antithrombotics/antiplatelets: Secondary | ICD-10-CM

## 2016-09-04 DIAGNOSIS — Z7901 Long term (current) use of anticoagulants: Secondary | ICD-10-CM

## 2016-09-04 DIAGNOSIS — Y92234 Operating room of hospital as the place of occurrence of the external cause: Secondary | ICD-10-CM | POA: Diagnosis not present

## 2016-09-04 DIAGNOSIS — R490 Dysphonia: Secondary | ICD-10-CM | POA: Diagnosis not present

## 2016-09-04 DIAGNOSIS — E1151 Type 2 diabetes mellitus with diabetic peripheral angiopathy without gangrene: Secondary | ICD-10-CM | POA: Diagnosis present

## 2016-09-04 DIAGNOSIS — Z888 Allergy status to other drugs, medicaments and biological substances status: Secondary | ICD-10-CM | POA: Diagnosis not present

## 2016-09-04 DIAGNOSIS — T4145XA Adverse effect of unspecified anesthetic, initial encounter: Secondary | ICD-10-CM | POA: Diagnosis not present

## 2016-09-04 DIAGNOSIS — Z794 Long term (current) use of insulin: Secondary | ICD-10-CM | POA: Diagnosis not present

## 2016-09-04 DIAGNOSIS — I2510 Atherosclerotic heart disease of native coronary artery without angina pectoris: Secondary | ICD-10-CM | POA: Diagnosis present

## 2016-09-04 DIAGNOSIS — I7410 Embolism and thrombosis of unspecified parts of aorta: Secondary | ICD-10-CM | POA: Diagnosis present

## 2016-09-04 DIAGNOSIS — R110 Nausea: Secondary | ICD-10-CM | POA: Diagnosis not present

## 2016-09-04 DIAGNOSIS — I69312 Visuospatial deficit and spatial neglect following cerebral infarction: Secondary | ICD-10-CM | POA: Diagnosis not present

## 2016-09-04 DIAGNOSIS — Z87891 Personal history of nicotine dependence: Secondary | ICD-10-CM | POA: Diagnosis not present

## 2016-09-04 DIAGNOSIS — B9620 Unspecified Escherichia coli [E. coli] as the cause of diseases classified elsewhere: Secondary | ICD-10-CM | POA: Diagnosis present

## 2016-09-04 DIAGNOSIS — Z79899 Other long term (current) drug therapy: Secondary | ICD-10-CM | POA: Diagnosis not present

## 2016-09-04 DIAGNOSIS — R339 Retention of urine, unspecified: Secondary | ICD-10-CM | POA: Diagnosis present

## 2016-09-04 DIAGNOSIS — I10 Essential (primary) hypertension: Secondary | ICD-10-CM | POA: Diagnosis not present

## 2016-09-04 DIAGNOSIS — M549 Dorsalgia, unspecified: Secondary | ICD-10-CM | POA: Diagnosis present

## 2016-09-04 DIAGNOSIS — I63231 Cerebral infarction due to unspecified occlusion or stenosis of right carotid arteries: Secondary | ICD-10-CM | POA: Diagnosis not present

## 2016-09-04 DIAGNOSIS — E785 Hyperlipidemia, unspecified: Secondary | ICD-10-CM | POA: Diagnosis present

## 2016-09-04 DIAGNOSIS — I69354 Hemiplegia and hemiparesis following cerebral infarction affecting left non-dominant side: Secondary | ICD-10-CM | POA: Diagnosis not present

## 2016-09-04 DIAGNOSIS — Z87442 Personal history of urinary calculi: Secondary | ICD-10-CM | POA: Diagnosis not present

## 2016-09-04 DIAGNOSIS — Z8249 Family history of ischemic heart disease and other diseases of the circulatory system: Secondary | ICD-10-CM | POA: Diagnosis not present

## 2016-09-04 DIAGNOSIS — G4733 Obstructive sleep apnea (adult) (pediatric): Secondary | ICD-10-CM | POA: Diagnosis present

## 2016-09-04 DIAGNOSIS — Z6837 Body mass index (BMI) 37.0-37.9, adult: Secondary | ICD-10-CM

## 2016-09-04 DIAGNOSIS — N261 Atrophy of kidney (terminal): Secondary | ICD-10-CM | POA: Diagnosis present

## 2016-09-04 DIAGNOSIS — G8929 Other chronic pain: Secondary | ICD-10-CM | POA: Diagnosis present

## 2016-09-04 HISTORY — PX: ENDARTERECTOMY: SHX5162

## 2016-09-04 LAB — URINALYSIS, ROUTINE W REFLEX MICROSCOPIC
Bilirubin Urine: NEGATIVE
Glucose, UA: 150 mg/dL — AB
Ketones, ur: NEGATIVE mg/dL
Nitrite: NEGATIVE
PROTEIN: NEGATIVE mg/dL
SPECIFIC GRAVITY, URINE: 1.015 (ref 1.005–1.030)
SQUAMOUS EPITHELIAL / LPF: NONE SEEN
pH: 5 (ref 5.0–8.0)

## 2016-09-04 LAB — GLUCOSE, CAPILLARY
GLUCOSE-CAPILLARY: 163 mg/dL — AB (ref 65–99)
Glucose-Capillary: 216 mg/dL — ABNORMAL HIGH (ref 65–99)
Glucose-Capillary: 203 mg/dL — ABNORMAL HIGH (ref 65–99)
Glucose-Capillary: 182 mg/dL — ABNORMAL HIGH (ref 65–99)

## 2016-09-04 LAB — POCT ACTIVATED CLOTTING TIME
ACTIVATED CLOTTING TIME: 230 s
Activated Clotting Time: 257 seconds

## 2016-09-04 SURGERY — ENDARTERECTOMY, CAROTID
Anesthesia: General | Site: Neck | Laterality: Right

## 2016-09-04 MED ORDER — GLIPIZIDE ER 10 MG PO TB24
10.0000 mg | ORAL_TABLET | Freq: Every day | ORAL | Status: DC
  Administered 2016-09-05 – 2016-09-06 (×2): 10 mg via ORAL
  Filled 2016-09-04 (×2): qty 1

## 2016-09-04 MED ORDER — SODIUM CHLORIDE 0.9 % IV SOLN
INTRAVENOUS | Status: DC | PRN
  Administered 2016-09-04: 09:00:00

## 2016-09-04 MED ORDER — CHLORHEXIDINE GLUCONATE 4 % EX LIQD: 60.0000 mL | Freq: Once | CUTANEOUS | Status: DC

## 2016-09-04 MED ORDER — FENTANYL CITRATE (PF) 100 MCG/2ML IJ SOLN
INTRAMUSCULAR | Status: DC | PRN
  Administered 2016-09-04 (×4): 50 ug via INTRAVENOUS

## 2016-09-04 MED ORDER — PROTAMINE SULFATE 10 MG/ML IV SOLN
INTRAVENOUS | Status: AC
  Filled 2016-09-04: qty 5

## 2016-09-04 MED ORDER — HEPARIN SODIUM (PORCINE) 1000 UNIT/ML IJ SOLN
INTRAMUSCULAR | Status: DC | PRN
  Administered 2016-09-04: 11000 [IU] via INTRAVENOUS

## 2016-09-04 MED ORDER — ACETAMINOPHEN 325 MG PO TABS
325.0000 mg | ORAL_TABLET | ORAL | Status: DC | PRN
  Administered 2016-09-06: 650 mg via ORAL
  Filled 2016-09-04: qty 2

## 2016-09-04 MED ORDER — HEPARIN SODIUM (PORCINE) 1000 UNIT/ML IJ SOLN
INTRAMUSCULAR | Status: AC
  Filled 2016-09-04: qty 1

## 2016-09-04 MED ORDER — SUGAMMADEX SODIUM 500 MG/5ML IV SOLN
INTRAVENOUS | Status: AC
  Filled 2016-09-04: qty 5

## 2016-09-04 MED ORDER — OXYCODONE-ACETAMINOPHEN 5-325 MG PO TABS
1.0000 | ORAL_TABLET | ORAL | Status: DC | PRN
  Administered 2016-09-04: 2 via ORAL
  Administered 2016-09-05: 1 via ORAL
  Administered 2016-09-05: 2 via ORAL
  Filled 2016-09-04 (×2): qty 2
  Filled 2016-09-04: qty 1
  Filled 2016-09-04: qty 2

## 2016-09-04 MED ORDER — PROPOFOL 10 MG/ML IV BOLUS
INTRAVENOUS | Status: DC | PRN
  Administered 2016-09-04: 30 mg via INTRAVENOUS
  Administered 2016-09-04: 120 mg via INTRAVENOUS
  Administered 2016-09-04 (×2): 40 mg via INTRAVENOUS

## 2016-09-04 MED ORDER — PHENYLEPHRINE HCL 10 MG/ML IJ SOLN
INTRAVENOUS | Status: DC | PRN
  Administered 2016-09-04: 25 ug/min via INTRAVENOUS

## 2016-09-04 MED ORDER — ESMOLOL HCL 100 MG/10ML IV SOLN
INTRAVENOUS | Status: AC
  Filled 2016-09-04: qty 10

## 2016-09-04 MED ORDER — POTASSIUM CHLORIDE CRYS ER 20 MEQ PO TBCR: 20.0000 meq | EXTENDED_RELEASE_TABLET | Freq: Every day | ORAL | Status: DC | PRN

## 2016-09-04 MED ORDER — LIDOCAINE HCL 1 % IJ SOLN
INTRAMUSCULAR | Status: AC
  Filled 2016-09-04: qty 20

## 2016-09-04 MED ORDER — SODIUM CHLORIDE 0.9 % IV SOLN
INTRAVENOUS | Status: DC
  Administered 2016-09-04: 23:00:00 via INTRAVENOUS

## 2016-09-04 MED ORDER — 0.9 % SODIUM CHLORIDE (POUR BTL) OPTIME
TOPICAL | Status: DC | PRN
  Administered 2016-09-04: 1000 mL
  Administered 2016-09-04: 2000 mL

## 2016-09-04 MED ORDER — MAGNESIUM SULFATE 2 GM/50ML IV SOLN: 2.0000 g | Freq: Every day | INTRAVENOUS | Status: DC | PRN

## 2016-09-04 MED ORDER — EPHEDRINE SULFATE-NACL 50-0.9 MG/10ML-% IV SOSY
PREFILLED_SYRINGE | INTRAVENOUS | Status: DC | PRN
  Administered 2016-09-04 (×2): 10 mg via INTRAVENOUS
  Administered 2016-09-04: 5 mg via INTRAVENOUS
  Administered 2016-09-04: 10 mg via INTRAVENOUS
  Administered 2016-09-04: 5 mg via INTRAVENOUS

## 2016-09-04 MED ORDER — DEXMEDETOMIDINE HCL IN NACL 200 MCG/50ML IV SOLN
INTRAVENOUS | Status: AC
  Filled 2016-09-04: qty 50

## 2016-09-04 MED ORDER — INSULIN GLARGINE 100 UNIT/ML ~~LOC~~ SOLN
61.0000 [IU] | Freq: Every day | SUBCUTANEOUS | Status: DC
  Administered 2016-09-04 – 2016-09-05 (×2): 61 [IU] via SUBCUTANEOUS
  Filled 2016-09-04 (×2): qty 0.61

## 2016-09-04 MED ORDER — HYDRALAZINE HCL 20 MG/ML IJ SOLN
5.0000 mg | INTRAMUSCULAR | Status: DC | PRN
Start: 2016-09-04 — End: 2016-09-06

## 2016-09-04 MED ORDER — LIDOCAINE 2% (20 MG/ML) 5 ML SYRINGE
INTRAMUSCULAR | Status: DC | PRN
Start: 2016-09-04 — End: 2016-09-04
  Administered 2016-09-04: 100 mg via INTRAVENOUS

## 2016-09-04 MED ORDER — ONDANSETRON HCL 4 MG/2ML IJ SOLN
INTRAMUSCULAR | Status: DC | PRN
  Administered 2016-09-04: 4 mg via INTRAVENOUS

## 2016-09-04 MED ORDER — PRAVASTATIN SODIUM 20 MG PO TABS
20.0000 mg | ORAL_TABLET | Freq: Every day | ORAL | Status: DC
  Administered 2016-09-04 – 2016-09-05 (×2): 20 mg via ORAL
  Filled 2016-09-04 (×2): qty 1

## 2016-09-04 MED ORDER — LISINOPRIL 10 MG PO TABS
10.0000 mg | ORAL_TABLET | Freq: Every day | ORAL | Status: DC
  Administered 2016-09-05 – 2016-09-06 (×2): 10 mg via ORAL
  Filled 2016-09-04 (×2): qty 1

## 2016-09-04 MED ORDER — MIDAZOLAM HCL 2 MG/2ML IJ SOLN
INTRAMUSCULAR | Status: AC
  Filled 2016-09-04: qty 2

## 2016-09-04 MED ORDER — ESCITALOPRAM OXALATE 10 MG PO TABS
5.0000 mg | ORAL_TABLET | Freq: Every day | ORAL | Status: DC
  Administered 2016-09-05 – 2016-09-06 (×2): 5 mg via ORAL
  Filled 2016-09-04 (×2): qty 1

## 2016-09-04 MED ORDER — DEXTROSE 5 % IV SOLN
INTRAVENOUS | Status: AC
  Filled 2016-09-04: qty 1.5

## 2016-09-04 MED ORDER — ACETAMINOPHEN 650 MG RE SUPP: 325.0000 mg | RECTAL | Status: DC | PRN

## 2016-09-04 MED ORDER — GUAIFENESIN-DM 100-10 MG/5ML PO SYRP: 15.0000 mL | ORAL_SOLUTION | ORAL | Status: DC | PRN

## 2016-09-04 MED ORDER — CALCIUM CARBONATE-VITAMIN D 500-200 MG-UNIT PO TABS
1.0000 | ORAL_TABLET | Freq: Every day | ORAL | Status: DC
  Administered 2016-09-05 – 2016-09-06 (×2): 1 via ORAL
  Filled 2016-09-04 (×2): qty 1

## 2016-09-04 MED ORDER — ONDANSETRON HCL 4 MG/2ML IJ SOLN
4.0000 mg | Freq: Four times a day (QID) | INTRAMUSCULAR | Status: DC | PRN
  Administered 2016-09-05 – 2016-09-06 (×2): 4 mg via INTRAVENOUS
  Filled 2016-09-04 (×2): qty 2

## 2016-09-04 MED ORDER — HEMOSTATIC AGENTS (NO CHARGE) OPTIME
TOPICAL | Status: DC | PRN
  Administered 2016-09-04: 1 via TOPICAL

## 2016-09-04 MED ORDER — SODIUM CHLORIDE 0.9 % IV SOLN
0.0125 ug/kg/min | INTRAVENOUS | Status: AC
  Administered 2016-09-04: 10:00:00 via INTRAVENOUS
  Administered 2016-09-04: 0.15 ug/kg/min via INTRAVENOUS
  Filled 2016-09-04: qty 2000

## 2016-09-04 MED ORDER — FENTANYL CITRATE (PF) 100 MCG/2ML IJ SOLN: 25.0000 ug | INTRAMUSCULAR | Status: DC | PRN

## 2016-09-04 MED ORDER — OXYCODONE-ACETAMINOPHEN 5-325 MG PO TABS: 1.0000 | ORAL_TABLET | Freq: Four times a day (QID) | ORAL | 0 refills | Status: DC | PRN

## 2016-09-04 MED ORDER — LACTATED RINGERS IV SOLN
INTRAVENOUS | Status: DC | PRN
  Administered 2016-09-04: 07:00:00 via INTRAVENOUS

## 2016-09-04 MED ORDER — ESMOLOL HCL 100 MG/10ML IV SOLN
INTRAVENOUS | Status: DC | PRN
  Administered 2016-09-04: 30 mg via INTRAVENOUS

## 2016-09-04 MED ORDER — FENTANYL CITRATE (PF) 250 MCG/5ML IJ SOLN
INTRAMUSCULAR | Status: AC
Start: 2016-09-04 — End: 2016-09-04
  Filled 2016-09-04: qty 5

## 2016-09-04 MED ORDER — METOPROLOL TARTRATE 5 MG/5ML IV SOLN: 2.0000 mg | INTRAVENOUS | Status: DC | PRN

## 2016-09-04 MED ORDER — ASPIRIN EC 325 MG PO TBEC
325.0000 mg | DELAYED_RELEASE_TABLET | Freq: Every day | ORAL | Status: DC
  Administered 2016-09-05 – 2016-09-06 (×2): 325 mg via ORAL
  Filled 2016-09-04 (×2): qty 1

## 2016-09-04 MED ORDER — SUCCINYLCHOLINE CHLORIDE 200 MG/10ML IV SOSY
PREFILLED_SYRINGE | INTRAVENOUS | Status: DC | PRN
  Administered 2016-09-04: 120 mg via INTRAVENOUS

## 2016-09-04 MED ORDER — LISINOPRIL-HYDROCHLOROTHIAZIDE 10-12.5 MG PO TABS: 1.0000 | ORAL_TABLET | Freq: Every day | ORAL | Status: DC

## 2016-09-04 MED ORDER — LABETALOL HCL 5 MG/ML IV SOLN
INTRAVENOUS | Status: AC
  Filled 2016-09-04: qty 4

## 2016-09-04 MED ORDER — DEXTROSE 5 % IV SOLN
1.5000 g | INTRAVENOUS | Status: AC
  Administered 2016-09-04: 1.5 g via INTRAVENOUS

## 2016-09-04 MED ORDER — PHENOL 1.4 % MT LIQD: 1.0000 | OROMUCOSAL | Status: DC | PRN

## 2016-09-04 MED ORDER — DOCUSATE SODIUM 100 MG PO CAPS
100.0000 mg | ORAL_CAPSULE | Freq: Every day | ORAL | Status: DC
  Administered 2016-09-06: 100 mg via ORAL
  Filled 2016-09-04 (×2): qty 1

## 2016-09-04 MED ORDER — ALUM & MAG HYDROXIDE-SIMETH 200-200-20 MG/5ML PO SUSP: 15.0000 mL | ORAL | Status: DC | PRN

## 2016-09-04 MED ORDER — LACTATED RINGERS IV SOLN
INTRAVENOUS | Status: DC | PRN
  Administered 2016-09-04 (×2): via INTRAVENOUS

## 2016-09-04 MED ORDER — PROTAMINE SULFATE 10 MG/ML IV SOLN
INTRAVENOUS | Status: DC | PRN
  Administered 2016-09-04: 50 mg via INTRAVENOUS

## 2016-09-04 MED ORDER — CLOPIDOGREL BISULFATE 75 MG PO TABS
75.0000 mg | ORAL_TABLET | Freq: Every day | ORAL | Status: DC
  Administered 2016-09-05 – 2016-09-06 (×2): 75 mg via ORAL
  Filled 2016-09-04 (×2): qty 1

## 2016-09-04 MED ORDER — CEFUROXIME SODIUM 1.5 G IJ SOLR
1.5000 g | Freq: Two times a day (BID) | INTRAMUSCULAR | Status: AC
  Administered 2016-09-04 – 2016-09-05 (×2): 1.5 g via INTRAVENOUS
  Filled 2016-09-04 (×2): qty 1.5

## 2016-09-04 MED ORDER — ROCURONIUM BROMIDE 10 MG/ML (PF) SYRINGE
PREFILLED_SYRINGE | INTRAVENOUS | Status: DC | PRN
  Administered 2016-09-04: 20 mg via INTRAVENOUS
  Administered 2016-09-04: 50 mg via INTRAVENOUS

## 2016-09-04 MED ORDER — MIDAZOLAM HCL 5 MG/5ML IJ SOLN
INTRAMUSCULAR | Status: DC | PRN
  Administered 2016-09-04: 2 mg via INTRAVENOUS

## 2016-09-04 MED ORDER — HYDROCHLOROTHIAZIDE 12.5 MG PO CAPS
12.5000 mg | ORAL_CAPSULE | Freq: Every day | ORAL | Status: DC
  Administered 2016-09-05 – 2016-09-06 (×2): 12.5 mg via ORAL
  Filled 2016-09-04 (×2): qty 1

## 2016-09-04 MED ORDER — SODIUM CHLORIDE 0.9 % IV SOLN
0.0125 ug/kg/min | INTRAVENOUS | Status: DC
  Filled 2016-09-04 (×2): qty 2000

## 2016-09-04 MED ORDER — VITAMIN B-12 1000 MCG PO TABS
1000.0000 ug | ORAL_TABLET | Freq: Every day | ORAL | Status: DC
  Administered 2016-09-05 – 2016-09-06 (×2): 1000 ug via ORAL
  Filled 2016-09-04 (×2): qty 1

## 2016-09-04 MED ORDER — MORPHINE SULFATE (PF) 4 MG/ML IV SOLN
2.0000 mg | INTRAVENOUS | Status: DC | PRN
  Administered 2016-09-04 (×3): 4 mg via INTRAVENOUS
  Filled 2016-09-04 (×3): qty 1

## 2016-09-04 MED ORDER — SODIUM CHLORIDE 0.9 % IV SOLN: INTRAVENOUS | Status: DC

## 2016-09-04 MED ORDER — PANTOPRAZOLE SODIUM 40 MG PO TBEC
40.0000 mg | DELAYED_RELEASE_TABLET | Freq: Every day | ORAL | Status: DC
  Administered 2016-09-05 – 2016-09-06 (×2): 40 mg via ORAL
  Filled 2016-09-04 (×2): qty 1

## 2016-09-04 MED ORDER — INSULIN ASPART 100 UNIT/ML ~~LOC~~ SOLN
0.0000 [IU] | Freq: Three times a day (TID) | SUBCUTANEOUS | Status: DC
  Administered 2016-09-04: 3 [IU] via SUBCUTANEOUS
  Administered 2016-09-05 – 2016-09-06 (×4): 2 [IU] via SUBCUTANEOUS

## 2016-09-04 MED ORDER — LABETALOL HCL 5 MG/ML IV SOLN
10.0000 mg | INTRAVENOUS | Status: DC | PRN
  Administered 2016-09-04: 10 mg via INTRAVENOUS

## 2016-09-04 MED ORDER — SODIUM CHLORIDE 0.9 % IV SOLN: 500.0000 mL | Freq: Once | INTRAVENOUS | Status: DC | PRN

## 2016-09-04 SURGICAL SUPPLY — 53 items
ADH SKN CLS APL DERMABOND .7 (GAUZE/BANDAGES/DRESSINGS) ×1
CANISTER SUCT 3000ML PPV (MISCELLANEOUS) ×2 IMPLANT
CATH ROBINSON RED A/P 18FR (CATHETERS) ×2 IMPLANT
CATH SUCT 10FR WHISTLE TIP (CATHETERS) ×2 IMPLANT
CLIP TI MEDIUM 6 (CLIP) ×4 IMPLANT
CLIP TI WIDE RED SMALL 6 (CLIP) ×3 IMPLANT
CRADLE DONUT ADULT HEAD (MISCELLANEOUS) ×2 IMPLANT
DERMABOND ADVANCED (GAUZE/BANDAGES/DRESSINGS) ×1
DERMABOND ADVANCED .7 DNX12 (GAUZE/BANDAGES/DRESSINGS) ×1 IMPLANT
DRAIN CHANNEL 15F RND FF W/TCR (WOUND CARE) IMPLANT
ELECT REM PT RETURN 9FT ADLT (ELECTROSURGICAL) ×2
ELECTRODE REM PT RTRN 9FT ADLT (ELECTROSURGICAL) ×1 IMPLANT
EVACUATOR SILICONE 100CC (DRAIN) ×1 IMPLANT
GLOVE BIOGEL PI IND STRL 6.5 (GLOVE) ×3 IMPLANT
GLOVE BIOGEL PI IND STRL 7.0 (GLOVE) IMPLANT
GLOVE BIOGEL PI IND STRL 7.5 (GLOVE) ×1 IMPLANT
GLOVE BIOGEL PI INDICATOR 6.5 (GLOVE) ×3
GLOVE BIOGEL PI INDICATOR 7.0 (GLOVE) ×1
GLOVE BIOGEL PI INDICATOR 7.5 (GLOVE) ×1
GLOVE ECLIPSE 6.5 STRL STRAW (GLOVE) ×2 IMPLANT
GLOVE SURG SS PI 6.5 STRL IVOR (GLOVE) ×2 IMPLANT
GLOVE SURG SS PI 7.5 STRL IVOR (GLOVE) ×2 IMPLANT
GOWN STRL REUS W/ TWL LRG LVL3 (GOWN DISPOSABLE) ×2 IMPLANT
GOWN STRL REUS W/ TWL XL LVL3 (GOWN DISPOSABLE) ×1 IMPLANT
GOWN STRL REUS W/TWL LRG LVL3 (GOWN DISPOSABLE) ×12
GOWN STRL REUS W/TWL XL LVL3 (GOWN DISPOSABLE) ×2
HEMOSTAT SNOW SURGICEL 2X4 (HEMOSTASIS) IMPLANT
INSERT FOGARTY SM (MISCELLANEOUS) IMPLANT
KIT BASIN OR (CUSTOM PROCEDURE TRAY) ×2 IMPLANT
KIT ROOM TURNOVER OR (KITS) ×2 IMPLANT
MARKER SKIN DUAL TIP RULER LAB (MISCELLANEOUS) ×1 IMPLANT
NDL HYPO 25GX1X1/2 BEV (NEEDLE) IMPLANT
NEEDLE HYPO 25GX1X1/2 BEV (NEEDLE) IMPLANT
NS IRRIG 1000ML POUR BTL (IV SOLUTION) ×6 IMPLANT
PACK CAROTID (CUSTOM PROCEDURE TRAY) ×2 IMPLANT
PAD ARMBOARD 7.5X6 YLW CONV (MISCELLANEOUS) ×4 IMPLANT
PATCH VASC XENOSURE 1CMX6CM (Vascular Products) ×2 IMPLANT
PATCH VASC XENOSURE 1X6 (Vascular Products) IMPLANT
POWDER SURGICEL 3.0 GRAM (HEMOSTASIS) ×1 IMPLANT
SHUNT CAROTID BYPASS 10 (VASCULAR PRODUCTS) IMPLANT
SHUNT CAROTID BYPASS 12FRX15.5 (VASCULAR PRODUCTS) IMPLANT
SUT ETHILON 3 0 PS 1 (SUTURE) IMPLANT
SUT PROLENE 6 0 BV (SUTURE) ×5 IMPLANT
SUT PROLENE 7 0 BV 1 (SUTURE) IMPLANT
SUT PROLENE 7 0 BV1 MDA (SUTURE) ×1 IMPLANT
SUT SILK 2 0 SH (SUTURE) ×1 IMPLANT
SUT SILK 3 0 (SUTURE)
SUT SILK 3-0 18XBRD TIE 12 (SUTURE) IMPLANT
SUT VIC AB 3-0 SH 27 (SUTURE) ×4
SUT VIC AB 3-0 SH 27X BRD (SUTURE) ×2 IMPLANT
SUT VICRYL 4-0 PS2 18IN ABS (SUTURE) ×2 IMPLANT
SYR CONTROL 10ML LL (SYRINGE) IMPLANT
WATER STERILE IRR 1000ML POUR (IV SOLUTION) ×2 IMPLANT

## 2016-09-04 NOTE — Transfer of Care (Signed)
Immediate Anesthesia Transfer of Care Note  Patient: Shaun Pugh  Procedure(s) Performed: Procedure(s): ENDARTERECTOMY CAROTID (Right)  Patient Location: PACU  Anesthesia Type:General  Level of Consciousness: awake, alert , oriented and patient cooperative  Airway & Oxygen Therapy: Patient Spontanous Breathing and Patient connected to face mask oxygen  Post-op Assessment: Report given to RN, Post -op Vital signs reviewed and stable and Patient moving all extremities X 4  Post vital signs: Reviewed and stable  Last Vitals:  Vitals:   09/04/16 0554  BP: (!) 176/81  Pulse: 78  Resp: 18  Temp: 36.4 C    Last Pain:  Vitals:   09/04/16 0554  TempSrc: Oral      Patients Stated Pain Goal: 2 (09/04/16 0553)  Complications: No apparent anesthesia complications

## 2016-09-04 NOTE — Anesthesia Preprocedure Evaluation (Addendum)
Anesthesia Evaluation  Patient identified by MRN, date of birth, ID band Patient awake    Reviewed: Allergy & Precautions, NPO status , Patient's Chart, lab work & pertinent test results  Airway Mallampati: II  TM Distance: >3 FB     Dental   Pulmonary sleep apnea , pneumonia, former smoker,    breath sounds clear to auscultation       Cardiovascular hypertension, + CAD, + Past MI and + Peripheral Vascular Disease   Rhythm:Regular Rate:Normal     Neuro/Psych Seizures -,     GI/Hepatic negative GI ROS, Neg liver ROS,   Endo/Other  diabetes  Renal/GU Renal disease     Musculoskeletal   Abdominal   Peds  Hematology   Anesthesia Other Findings   Reproductive/Obstetrics                            Anesthesia Physical Anesthesia Plan  ASA: III  Anesthesia Plan: General   Post-op Pain Management:    Induction: Intravenous  Airway Management Planned: Oral ETT  Additional Equipment:   Intra-op Plan:   Post-operative Plan: Possible Post-op intubation/ventilation  Informed Consent: I have reviewed the patients History and Physical, chart, labs and discussed the procedure including the risks, benefits and alternatives for the proposed anesthesia with the patient or authorized representative who has indicated his/her understanding and acceptance.   Dental advisory given  Plan Discussed with: CRNA and Anesthesiologist  Anesthesia Plan Comments:         Anesthesia Quick Evaluation

## 2016-09-04 NOTE — Anesthesia Procedure Notes (Signed)
Procedure Name: Intubation Date/Time: 09/04/2016 7:55 AM Performed by: Annabelle Harman A Pre-anesthesia Checklist: Emergency Drugs available, Patient identified, Suction available and Patient being monitored Patient Re-evaluated:Patient Re-evaluated prior to inductionOxygen Delivery Method: Circle system utilized Preoxygenation: Pre-oxygenation with 100% oxygen Intubation Type: IV induction Ventilation: Mask ventilation without difficulty and Oral airway inserted - appropriate to patient size Laryngoscope Size: Hyacinth Meeker and 2 Grade View: Grade I Tube type: Oral Tube size: 7.5 mm Number of attempts: 1 Airway Equipment and Method: Stylet Placement Confirmation: ETT inserted through vocal cords under direct vision,  positive ETCO2 and breath sounds checked- equal and bilateral Secured at: 23 cm Tube secured with: Tape Dental Injury: Teeth and Oropharynx as per pre-operative assessment

## 2016-09-04 NOTE — H&P (View-Only) (Signed)
[facility] of [facility]  Patient name: Shaun Pugh MRN: [id number] DOB: 12-Mar-1951 Sex: male   REFERRING PROVIDER:    Dr. Roda Shutters   REASON FOR CONSULT:    Carotid stenosis  HISTORY OF PRESENT ILLNESS:   Shaun Pugh is a 66 y.o. male, who is Referred today for evaluation of right carotid stenosis.  The patient had a right MCA stroke in 2013.  He suffered left-sided weakness and visual loss.  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  He was started on anticoagulation.  He recently has had evaluations of his right carotid artery which has shown progressive stenosis, now greater than 80%.  His Coumadin has been discontinued and now he is on aspirin and Plavix. He is a former smoker. The patient suffers hypercholesterolemia.  He has been intolerant of statin therapy.  He has a history of coronary artery disease, having undergone stenting in the past  PAST MEDICAL HISTORY    Past Medical History:  Diagnosis Date  . Acute pancreatitis 09/14/2011  . Atrophy of right kidney 09/15/2011  . CAD (coronary artery disease)    HX OF TWO HEART STENTS PLACED APPROX 10 OR 11 YRS AGO- STATES RELEASED BY CARDIOLOGIST- NO LONGER SEES  . Chronic back pain   . Diabetes (HCC)   . High cholesterol   . Hypertension   . Kidney calculi 09/15/2011  . Myocardial infarction (HCC)   . Seizures (HCC)    AFTER THE STROKE - BUT NONE SINCE  . Sleep apnea    STATES TRIED CPAP - DID NOT TOLERATE - RETURNED CPAP - DOES NOT HAVE ANYMORE  . Stented coronary artery approx 2004  . Stroke (HCC) 03/18/2012   LEFT SIDED WEAKNESS - STILL HAS SOME LUE>LLE WEAKNESS - ABLE TO AMBULATE WITH OR WITHOUT CANE BUT DOES NOT HAVE USE OF LEFT ARM  . TIA (transient ischemic attack)      FAMILY HISTORY   Family History  Problem Relation Age of Onset  . Congestive Heart Failure Mother 66  . Diabetes Father 90  . Heart attack Father   .  Anesthesia problems Neg Hx   . Hypotension Neg Hx   . Malignant hyperthermia Neg Hx   . Pseudochol deficiency Neg Hx     SOCIAL HISTORY:   Social History   Social History  . Marital status: Married    Spouse name: N/A  . Number of children: N/A  . Years of education: N/A   Occupational History  . Not on file.   Social History Main Topics  . Smoking status: Former Smoker    Packs/day: 1.00    Years: 45.00    Types: Cigarettes    Quit date: 03/18/2012  . Smokeless tobacco: Never Used  . Alcohol use No  . Drug use: No  . Sexual activity: Yes    Birth control/ protection: None   Other Topics Concern  . Not on file   Social History Narrative  . No narrative on file    ALLERGIES:    Allergies  Allergen Reactions  . Crestor [Rosuvastatin Calcium]     Joint pain and weakness   . Lipitor [Atorvastatin]     Joint pain and weakness    CURRENT MEDICATIONS:    Current Outpatient Prescriptions  Medication Sig Dispense Refill  . aspirin EC 325 MG tablet Take 1 tablet (325 mg total) by mouth daily. 90 tablet 0  . calcium-vitamin D (OSCAL  WITH D) 500-200 MG-UNIT tablet Take 1 tablet by mouth daily with breakfast.    . clobetasol cream (TEMOVATE) 0.05 % Apply 1 application topically 2 (two) times daily.    . clopidogrel (PLAVIX) 75 MG tablet Take 1 tablet (75 mg total) by mouth daily. 90 tablet 3  . escitalopram (LEXAPRO) 5 MG tablet Take 1 tablet by mouth daily.  2  . fluticasone (CUTIVATE) 0.05 % cream Apply topically 2 (two) times daily.    Marland Kitchen glipiZIDE (GLUCOTROL XL) 10 MG 24 hr tablet Take 10 mg by mouth daily with breakfast.    . Insulin Glargine (TOUJEO SOLOSTAR) 300 UNIT/ML SOPN Inject 51 Units into the skin.    Marland Kitchen lisinopril-hydrochlorothiazide (PRINZIDE,ZESTORETIC) 10-12.5 MG tablet Take 1 tablet by mouth daily.  0  . Omega-3 Fatty Acids (FISH OIL) 1000 MG CAPS Take 1 capsule by mouth daily.    . pantoprazole (PROTONIX) 40 MG tablet Take 1 tablet by mouth daily.   2  . vitamin B-12 (CYANOCOBALAMIN) 1000 MCG tablet Take 1,000 mcg by mouth daily.     No current facility-administered medications for this visit.     REVIEW OF SYSTEMS:    denotes positive finding,  denotes negative finding Cardiac  Comments:  Chest pain or chest pressure:    Shortness of breath upon exertion: x   Short of breath when lying flat:    Irregular heart rhythm:        Vascular    Pain in calf, thigh, or hip brought on by ambulation: x   Pain in feet at night that wakes you up from your sleep:     Blood clot in your veins:    Leg swelling:         Pulmonary    Oxygen at home:    Productive cough:     Wheezing:         Neurologic    Sudden weakness in arms or legs:  x   Sudden numbness in arms or legs:  x   Sudden onset of difficulty speaking or slurred speech:    Temporary loss of vision in one eye:  x   Problems with dizziness:  x       Gastrointestinal    Blood in stool:      Vomited blood:         Genitourinary    Burning when urinating:     Blood in urine:        Psychiatric    Major depression:         Hematologic    Bleeding problems:    Problems with blood clotting too easily:        Skin    Rashes or ulcers:        Constitutional    Fever or chills:     PHYSICAL EXAM:   Vitals:   08/24/16 1208 08/24/16 1211  BP: 130/82 132/82  Pulse: 86   Resp: 20   Temp: 97 F (36.1 C)   TempSrc: Oral   SpO2: 95%   Weight: 273 lb (123.8 kg)   Height:  (1.854 m)     GENERAL: The patient is a well-nourished male, in no acute distress. The vital signs are documented above. CARDIAC: There is a regular rate and rhythm.  VASCULAR: No carotid bruits PULMONARY: Nonlabored respirations ABDOMEN: Soft and non-tender.  MUSCULOSKELETAL: There are no major deformities or cyanosis. NEUROLOGIC: Left-sided weakness SKIN: There are no ulcers or rashes noted. PSYCHIATRIC: The  patient has a normal affect.  STUDIES:   I have reviewed his  carotid Doppler studies which showed greater than 80% right carotid stenosis and less than 39% left carotid stenosis.  I have also reviewed his CT angiogram which does reveal that the lesion is accessible  ASSESSMENT and PLAN   Symptomatic right carotid stenosis: I discussed proceeding with right carotid endarterectomy.  Because of his aortic thrombus, I do not think he is a good candidate for carotid stenting.  We discussed the risks and benefits of carotid endarterectomy including the risk of stroke, nerve injury, bleeding, and cardiopulmonary complications.  I will not stop his Plavix and aspirin prior to his operation.  I am sending him to get formal cardiac testing.  I have placed him on the schedule for Friday April 27   Durene Cal, MD [facility] [phone] Pager [phone]

## 2016-09-04 NOTE — Anesthesia Postprocedure Evaluation (Signed)
Anesthesia Post Note  Patient: Shaun Pugh  Procedure(s) Performed: Procedure(s) (LRB): ENDARTERECTOMY CAROTID (Right)  Patient location during evaluation: PACU Anesthesia Type: General Level of consciousness: awake Pain management: pain level controlled Respiratory status: spontaneous breathing Cardiovascular status: stable Anesthetic complications: no       Last Vitals:  Vitals:   09/04/16 1215 09/04/16 1256  BP:  139/88  Pulse: 85 86  Resp: (!) 21 (!) 38  Temp: 36.1 C 36.9 C    Last Pain:  Vitals:   09/04/16 1300  TempSrc:   PainSc: 8                  Brodyn Depuy

## 2016-09-04 NOTE — Discharge Summary (Signed)
[facility] Discharge Summary  Shaun Pugh 05/19/1950 65 y.o. male  [id number]  Admission Date: 09/04/2016  Discharge Date: 09/06/2016  Physician: Nada Libman, MD  Admission Diagnosis: Right Internal Carotid Artery Stenosis  I65.21  HPI:   This is a 66 y.o. male who was referred for evaluation of right carotid stenosis.  The patient had a right MCA stroke in 2013. He suffered left-sided weakness and visual loss.  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  He was started on anticoagulation.  He recently has had evaluations of his right carotid artery which has shown progressive stenosis, now greater than 80%.  His Coumadin has been discontinued and now he is on aspirin and Plavix. He is a former smoker. The patient suffers hypercholesterolemia.  He has been intolerant of statin therapy. He has a history of coronary artery disease, having undergone stenting in the past  Hospital Course:  The patient was admitted to the hospital and taken to the operating room on 09/04/2016 and underwent right carotid endarterectomy.  The patient tolerated the procedure well and was transported to the PACU in stable condition.  By POD 1, the patient's neuro status was intact except for residual deficits from prior CVA. He was a little unsteady on his feet and he had a foley in place due to urinary retention. He also had some nausea that day.  By POD 2, his nausea had improved. He was swallowing fine. His urinary retention was resolved. He did have some mild hoarseness. He was doing better with ambulation. His incision had some mild swelling. He was discharged home on POD 2 in good condition.     No results for input(s): NA, K, CL, CO2, GLUCOSE, BUN, CALCIUM in the last 72 hours.  Invalid input(s): CRATININE No results for input(s): WBC, HGB, HCT, PLT in the last 72 hours. No results for input(s): INR in the last 72 hours.  Discharge  Instructions:   The patient is discharged to home with extensive instructions on wound care and progressive ambulation.  They are instructed not to drive or perform any heavy lifting until returning to see the physician in his office.  Discharge Instructions    CAROTID Sugery: Call MD for difficulty swallowing or speaking; weakness in arms or legs that is a new symtom; severe headache.  If you have increased swelling in the neck and/or  are having difficulty breathing, CALL [phone]    Complete by:  As directed    Call MD for:  redness, tenderness, or signs of infection (pain, swelling, bleeding, redness, odor or green/yellow discharge around incision site)    Complete by:  As directed    Call MD for:  severe or increased pain, loss or decreased feeling  in affected limb(s)    Complete by:  As directed    Call MD for:  temperature >100.5    Complete by:  As directed    Discharge wound care:    Complete by:  As directed    Wash wound daily with soap and water and pat dry. Do not apply any creams or ointments on your incision. Do not pull off the skin glue. It will peel off on its own.   Driving Restrictions    Complete by:  As directed    No driving until cleared by physician   Increase activity slowly    Complete by:  As directed    Walk with assistance use  walker or cane as needed   Resume previous diet    Complete by:  As directed       Discharge Diagnosis:  Right Internal Carotid Artery Stenosis  I65.21  Secondary Diagnosis: Patient Active Problem List   Diagnosis Date Noted  . Symptomatic carotid artery stenosis, right 09/04/2016  . History of stroke 06/24/2016  . 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 Stenosis of right carotid artery 06/17/2016  . Blurry vision, bilateral   . CVA (cerebral vascular accident) (HCC) 03/13/2016  . Diabetes (HCC) 03/13/2016  . Essential hypertension 03/13/2016  . Hyperlipidemia 03/13/2016  . Muscle weakness (generalized) 05/31/2012  .  Difficulty walking 05/31/2012  . Lack of coordination 05/31/2012  . Cytotoxic cerebral edema (HCC) 03/19/2012  . Aortic embolism or thrombosis (HCC) 03/19/2012  . Cerebrovascular accident (CVA) due to embolism of left posterior cerebral artery (HCC) 03/18/2012  . Seizure (HCC) 03/18/2012  . Stented coronary artery   . Hemiplegia affecting nondominant side (HCC) 03/17/2012  . Atrophy of right kidney 09/15/2011  . Kidney calculi 09/15/2011  . Acute pancreatitis 09/14/2011  . Cholelithiasis 09/14/2011  . Hyperglycemia 09/14/2011   Past Medical History:  Diagnosis Date  . Acute pancreatitis 09/14/2011  . Atrophy of right kidney 09/15/2011  . CAD (coronary artery disease)    DES to mid LAD and distal RCA 2005 - Dr. Amil Amen  . Carotid artery disease (HCC)   . Chronic back pain   . Essential hypertension   . History of stroke 03/18/2012   Aortic atheroma implicated, was on Coumadin for a period of time and more recently aspirin and Plavix  . Hyperlipidemia   . Kidney calculi 09/15/2011  . Myocardial infarction (HCC)   . Seizures (HCC)    Following stroke  . Sleep apnea    Reportedly intolerant of CPAP  . Type 2 diabetes mellitus (HCC)     Allergies as of 09/04/2016      Reactions   Crestor [rosuvastatin Calcium]    Joint pain and weakness   Lipitor [atorvastatin]    Joint pain and weakness      Medication List    TAKE these medications   aspirin EC 325 MG tablet Take 1 tablet (325 mg total) by mouth daily.   calcium-vitamin D 500-200 MG-UNIT tablet Commonly known as:  OSCAL WITH D Take 1 tablet by mouth daily with breakfast.   clobetasol cream 0.05 % Commonly known as:  TEMOVATE Apply 1 application topically 2 (two) times daily as needed.   clopidogrel 75 MG tablet Commonly known as:  PLAVIX Take 1 tablet (75 mg total) by mouth daily.   escitalopram 5 MG tablet Commonly known as:  LEXAPRO Take 1 tablet by mouth daily.   Fish Oil 1000 MG Caps Take 1 capsule by mouth  daily.   fluticasone 0.05 % cream Commonly known as:  CUTIVATE Apply 1 application topically 2 (two) times daily as needed.   glipiZIDE 10 MG 24 hr tablet Commonly known as:  GLUCOTROL XL Take 10 mg by mouth daily with breakfast.   lisinopril-hydrochlorothiazide 10-12.5 MG tablet Commonly known as:  PRINZIDE,ZESTORETIC Take 1 tablet by mouth daily.   oxyCODONE-acetaminophen 5-325 MG tablet Commonly known as:  PERCOCET/ROXICET Take 1-2 tablets by mouth every 6 (six) hours as needed for moderate pain.   pantoprazole 40 MG tablet Commonly known as:  PROTONIX Take 1 tablet by mouth daily.   pravastatin 20 MG tablet Commonly known as:  PRAVACHOL Take 20 mg by mouth daily.  TOUJEO SOLOSTAR 300 UNIT/ML Sopn Generic drug:  Insulin Glargine Inject 61 Units into the skin at bedtime.   vitamin B-12 1000 MCG tablet Commonly known as:  CYANOCOBALAMIN Take 1,000 mcg by mouth daily.       Percocet #10 No Refill  Disposition: Home  Patient's condition: is Good  Follow up: 1. Dr.  Myra Gianotti in 2 weeks.   [name], PA-C [facility] [phone]  --- For [facility] use --- Instructions: Press F2 to tab through selections.  Delete question if not applicable.   Modified Rankin score at D/C (0-6): 1  IV medication needed for:  1. Hypertension: No 2. Hypotension: No  Post-op Complications: No  1. Post-op CVA or TIA: No  2. CN injury: No  3. Myocardial infarction: No  4.  CHF: No  5.  Dysrhythmia (new): No  6. Wound infection: No  7. Reperfusion symptoms: No  8. Return to OR: No  Discharge medications: Statin use:  Yes If No:  For Medical reasons,  Non-compliant,  Not-indicated ASA use:  Yes  If No:  For Medical reasons,  Non-compliant,  Not-indicated Beta blocker use:  No If No:  For Medical reasons,  Non-compliant, [x ] Not-indicated ACE-Inhibitor use:  Yes If No:  For Medical reasons,  Non-compliant, [  ] Not-indicated P2Y12 Antagonist use: Yes, [x ] Plavix,  Plasugrel,  Ticlopinine,  Ticagrelor,  Other,  No for medical reason,  Non-compliant,  Not-indicated Anti-coagulant use:  No,  Warfarin,  Rivaroxaban,  Dabigatran,  Other,  No for medical reason,  Non-compliant, [x ] Not-indicated

## 2016-09-04 NOTE — Interval H&P Note (Signed)
History and Physical Interval Note:  09/04/2016 7:26 AM  Shaun Pugh  has presented today for surgery, with the diagnosis of Right Internal Carotid Artery Stenosis  I65.21  The various methods of treatment have been discussed with the patient and family. After consideration of risks, benefits and other options for treatment, the patient has consented to  Procedure(s): ENDARTERECTOMY CAROTID (Right) as a surgical intervention .  The patient's history has been reviewed, patient examined, no change in status, stable for surgery.  I have reviewed the patient's chart and labs.  Questions were answered to the patient's satisfaction.     Durene Cal

## 2016-09-04 NOTE — Op Note (Signed)
Patient name: Shaun Pugh MRN: [id number] DOB: Nov 20, 1950 Sex: male  09/04/2016 Pre-operative Diagnosis: Symptomatic   right carotid stenosis Post-operative diagnosis:  Same Surgeon:  Durene Cal Assistants:  Karsten Ro Procedure:    right carotid Endarterectomy with bovine pericardial  patch angioplasty Anesthesia:  General Blood Loss:  See anesthesia record Specimens:  None  Findings:  90 %stenosis; Thrombus:  None  Indications:  The patient has recently had a stroke.  He has no new thrombus within the innominate artery.  A recent ultrasound and CT scan showed greater than 80% right carotid stenosis.  It is recommended that he proceed with carotid endarterectomy.  The risks and benefits of the operation were discussed with the patient and his family and he wished to proceed.  At baseline he continues to have left-sided weakness.  Procedure:  The patient was identified in the holding area and taken to [facility] OR ROOM 16  The patient was then placed supine on the table.   General endotrachial anesthesia was administered.  The patient was prepped and draped in the usual sterile fashion.  A time out was called and antibiotics were administered.  The incision was made along the anterior border of the right sternocleidomastoid muscle.  Cautery was used to dissect through the subcutaneous tissue.  The platysma muscle was divided with cautery.  The internal jugular vein was exposed along its anterior medial border.  The common facial vein was exposed and then divided between 2-0 silk ties and metal clips.  The common carotid artery was then circumferentially exposed and encircled with an umbilical tape.  The vagus nerve was identified and protected.  Next sharp dissection was used to expose the external carotid artery and the superior thyroid artery.  The were encircled with a blue vessel loop and a 2-0 silk tie respectively.  Finally, the internal carotid was carefully dissected free.  An umbilical tape  was placed around the internal carotid artery distal to the diseased segment.  The hypoglossal nerve was visualized throughout and protected.  The patient was given systemic heparinization.  A bovine carotid patch was selected and prepared on the back table.  A 10 french shunt was also prepared.  After blood pressure readings were appropriate and the heparin had been given time to circulate, the internal carotid artery was occluded with a baby Gregory clamp.  The external and common carotid arteries were then occluded with vascular clamps and the 2-0 tie tightened on the superior thyroid artery.  A #11 blade was used to make an arteriotomy in the common carotid artery.  This was extended with Potts scissors along the anterior and lateral border of the common and internal carotid artery.  Approximately 90% stenosis was identified.  There was no thrombus identified.  The 10 french shunt was then placed.  A kleiner kuntz elevator was used to perform endarterectomy.  An eversion endarterectomy was performed in the external carotid artery.  A good distal endpoint was obtained in the internal carotid artery.  The specimen was removed and sent to pathology.  Heparinized saline was used to irrigate the endarterectomized field.  All potential embolic debris was removed.  Bovine pericardial patch angioplasty was then performed using a running 6-0 Prolene. Just prior to completion of the repair, the shunt was removed. The common internal and external carotid arteries were all appropriately flushed. The artery was again irrigated with heparin saline.  The anastomosis was then secured. The clamp was first released on the external carotid artery  followed by the common carotid artery approximately 30 seconds later, bloodflow was reestablish through the internal carotid artery.  Next, a hand-held  Doppler was used to evaluate the signals in the common, external, and internal  carotid arteries, all of which had appropriate  signals. I then administered  50 mg protamine. The wound was then irrigated.  After hemostasis was achieved, the carotid sheath was reapproximated with 3-0 Vicryl. The  platysma muscle was reapproximated with running 3-0 Vicryl. The skin  was closed with 4-0 Vicryl. Dermabond was placed on the skin. The  patient was then successfully extubated. His neurologic exam was  similar to his preprocedural exam. The patient was then taken to recovery room  in stable condition. There were no complications.     Disposition:  To PACU in stable condition.  Relevant Operative Details:  Ulcerated plaque at the bifurcation.  Hypoglossal was visualized and protected.  7-0 proline sutures were used to tack the distal plaque.  A shunt was utilized as the patient had very poor backbleeding.  Bovine pericardial patch angioplasty was performed.  Juleen China, M.D. [facility] [place] Office: [phone] Pager:  [phone]

## 2016-09-04 NOTE — Progress Notes (Signed)
Pt I and O cathed for 900 cc bloody , concentrated urine

## 2016-09-04 NOTE — Progress Notes (Signed)
  [facility] Day of [facility] Note  Subjective:  No complaints.   Vitals:   09/04/16 1215 09/04/16 1256  BP:  139/88  Pulse: 85 86  Resp: (!) 21 (!) 38  Temp: 97 F (36.1 C) 98.4 F (36.9 C)   Right neck incision intact without hematoma with ~5 cc output in drain. Smile symmetric. Tongue midline. Normal strength on right. Residual left upper and lower extremity weakness from prior CVA.  Residual right eye deficits.   Assessment/Plan:  This is a 66 y.o. male who is s/p right carotid endarterectomy  No new neurological deficits. Has residual deficits from CVA. Required in and out cath today in PACU. UA suggestive of possible UTI. Will obtain urine culture. If having continued urinary retention, will place foley.   Shaun Pugh, [place] Pager: [phone] 09/04/2016 2:51 PM

## 2016-09-05 LAB — GLUCOSE, CAPILLARY
GLUCOSE-CAPILLARY: 191 mg/dL — AB (ref 65–99)
Glucose-Capillary: 173 mg/dL — ABNORMAL HIGH (ref 65–99)
Glucose-Capillary: 156 mg/dL — ABNORMAL HIGH (ref 65–99)
Glucose-Capillary: 141 mg/dL — ABNORMAL HIGH (ref 65–99)

## 2016-09-05 LAB — BASIC METABOLIC PANEL
Anion gap: 9 (ref 5–15)
BUN: 17 mg/dL (ref 6–20)
CO2: 23 mmol/L (ref 22–32)
Calcium: 8.4 mg/dL — ABNORMAL LOW (ref 8.9–10.3)
Chloride: 103 mmol/L (ref 101–111)
Creatinine, Ser: 1.39 mg/dL — ABNORMAL HIGH (ref 0.61–1.24)
GFR calc Af Amer: 59 mL/min — ABNORMAL LOW (ref >60)
GFR, EST NON AFRICAN AMERICAN: 51 mL/min — AB (ref >60)
GLUCOSE: 154 mg/dL — AB (ref 65–99)
Potassium: 4.1 mmol/L (ref 3.5–5.1)
SODIUM: 135 mmol/L (ref 135–145)

## 2016-09-05 LAB — CBC
HCT: 34.3 % — ABNORMAL LOW (ref 39.0–52.0)
HEMOGLOBIN: 11.5 g/dL — AB (ref 13.0–17.0)
MCH: 30.6 pg (ref 26.0–34.0)
MCHC: 33.5 g/dL (ref 30.0–36.0)
MCV: 91.2 fL (ref 78.0–100.0)
PLATELETS: 176 10*3/uL (ref 150–400)
RBC: 3.76 MIL/uL — ABNORMAL LOW (ref 4.22–5.81)
RDW: 14.6 % (ref 11.5–15.5)
WBC: 13.3 10*3/uL — AB (ref 4.0–10.5)

## 2016-09-05 NOTE — Progress Notes (Signed)
Pt denies any dizziness or nausea at this time. Pt remains in bed. Marisue Ivan RN

## 2016-09-05 NOTE — Progress Notes (Signed)
Pt sat up in bed, dangled legs, but became nauseous, threw up, and unable to progress ambulation further at this time.  Will continue to monitor.

## 2016-09-05 NOTE — Progress Notes (Signed)
Pt became dizzy and nauseated while ambulating. Pt vomited small amount. Got pt back to bed and rechecked vital signs.  Marisue Ivan RN

## 2016-09-05 NOTE — Progress Notes (Signed)
eLink Physician-Brief Progress Note Patient Name: Shaun Pugh DOB: [birth year] MRN: [id number]   Date of Service  09/05/2016  HPI/Events of Note  Ucx + for e coli H/O UTI, neurogenic bladder.   eICU Interventions  Recommend clinical team to eval if pt needs to be on antibiotics.        Cyris Maalouf 09/05/2016, 8:25 PM

## 2016-09-05 NOTE — Progress Notes (Signed)
   VASCULAR SURGERY ASSESSMENT & PLAN:   1 Day Post-Op s/p: Right CEA. Neuro baseline.   He was a little unsteady on his feet this morning. Also, he has a Foley catheter in place. Possible discharge later this morning if able to void, eat, and ambulate.   SUBJECTIVE:   He states that he was a little unsteady on his feet this morning.  PHYSICAL EXAM:   Vitals:   09/04/16 2332 09/04/16 2341 09/05/16 0343 09/05/16 0752  BP:   129/75 130/74  Pulse:   90 89  Resp:   18 16  Temp:   98.2 F (36.8 C) 98.2 F (36.8 C)  TempSrc:   Oral Oral  SpO2: (!) 86% 95% 96% 92%  Weight:      Height:       Mild swelling right neck. No change in preoperative left-sided weakness. JP 45 cc for 2 shifts.    LABS:   Lab Results  Component Value Date   WBC 13.3 (H) 09/05/2016   HGB 11.5 (L) 09/05/2016   HCT 34.3 (L) 09/05/2016   MCV 91.2 09/05/2016   PLT 176 09/05/2016   Lab Results  Component Value Date   CREATININE 1.39 (H) 09/05/2016   Lab Results  Component Value Date   INR 1.05 09/01/2016   CBG (last 3)   Recent Labs  09/04/16 1625 09/04/16 2131 09/05/16 0749  GLUCAP 203* 182* 191*    PROBLEM LIST:    Active Problems:   Symptomatic carotid artery stenosis, right   CURRENT MEDS:   . aspirin EC  325 mg Oral Daily  . calcium-vitamin D  1 tablet Oral Q breakfast  . clopidogrel  75 mg Oral Daily  . docusate sodium  100 mg Oral Daily  . escitalopram  5 mg Oral Daily  . glipiZIDE  10 mg Oral Q breakfast  . hydrochlorothiazide  12.5 mg Oral Daily  . insulin aspart  0-9 Units Subcutaneous TID WC  . insulin glargine  61 Units Subcutaneous QHS  . lisinopril  10 mg Oral Daily  . pantoprazole  40 mg Oral Daily  . pravastatin  20 mg Oral QHS  . vitamin B-12  1,000 mcg Oral Daily    Cari Caraway Beeper: [phone] Office: [phone] 09/05/2016

## 2016-09-05 NOTE — Plan of Care (Signed)
Problem: Activity: Goal: Ability to return to normal activity level will improve Outcome: Not Progressing Pt has had n/v on both attempts to get oob will reattempt in the morning

## 2016-09-06 LAB — URINE CULTURE: Culture: >=100000 — AB

## 2016-09-06 LAB — GLUCOSE, CAPILLARY: Glucose-Capillary: 169 mg/dL — ABNORMAL HIGH (ref 65–99)

## 2016-09-06 NOTE — Progress Notes (Signed)
Pt wanted to get oob. Pt assisted because of old CVA does poorly with walker, he does better with his cane but tires easily pt states his baseline is  Only about 25 feet at a time. He again became nauseous when getting up 4 mg Zofran given will continue to monitor

## 2016-09-06 NOTE — Progress Notes (Signed)
   VASCULAR SURGERY ASSESSMENT & PLAN:   2 Days Post-Op s/p: Right CEA  Did better ambulating today.  Mild nausea, likely related to anesthesia. Home this AM if nausea resolves.  Was able to void.  Mild hoarseness. Swallowing fine.   VQI:  Is on ASA  Is on a statin   SUBJECTIVE:   Mild nausea earlier this AM  PHYSICAL EXAM:   Vitals:   09/05/16 1923 09/05/16 2330 09/06/16 0311 09/06/16 0811  BP: 126/68 111/60 (!) 113/57 118/80  Pulse: 90 84 85 86  Resp: Temp: 98.3 F (36.8 C) 98.1 F (36.7 C) 98.3 F (36.8 C) 98.6 F (37 C)  TempSrc: Oral Oral Oral Oral  SpO2: 95% 100% 99% 96%  Weight:      Height:       Neuro at baseline (has left sided weakness) Incision looks fine. Mild swelling Tongue midline.   LABS:   Lab Results  Component Value Date   WBC 13.3 (H) 09/05/2016   HGB 11.5 (L) 09/05/2016   HCT 34.3 (L) 09/05/2016   MCV 91.2 09/05/2016   PLT 176 09/05/2016   Lab Results  Component Value Date   CREATININE 1.39 (H) 09/05/2016   Lab Results  Component Value Date   INR 1.05 09/01/2016   CBG (last 3)   Recent Labs  09/05/16 1612 09/05/16 2138 09/06/16 0808  GLUCAP 156* 141* 169*    PROBLEM LIST:    Active Problems:   Symptomatic carotid artery stenosis, right   CURRENT MEDS:   . aspirin EC  325 mg Oral Daily  . calcium-vitamin D  1 tablet Oral Q breakfast  . clopidogrel  75 mg Oral Daily  . docusate sodium  100 mg Oral Daily  . escitalopram  5 mg Oral Daily  . glipiZIDE  10 mg Oral Q breakfast  . hydrochlorothiazide  12.5 mg Oral Daily  . insulin aspart  0-9 Units Subcutaneous TID WC  . insulin glargine  61 Units Subcutaneous QHS  . lisinopril  10 mg Oral Daily  . pantoprazole  40 mg Oral Daily  . pravastatin  20 mg Oral QHS  . vitamin B-12  1,000 mcg Oral Daily    Cari Caraway Beeper: [phone] Office: [phone] 09/06/2016

## 2016-09-06 NOTE — Progress Notes (Signed)
Patient discharged home with wife. Pt and wife given discharge instructions and verbally stated importance and understanding. Teach back effective. All belongings sent home with pt. Dentures and clothing and cell phones.

## 2016-09-07 ENCOUNTER — Encounter (HOSPITAL_COMMUNITY): Payer: Self-pay | Admitting: Surgery

## 2016-09-07 ENCOUNTER — Telehealth: Payer: Self-pay | Admitting: Surgery

## 2016-09-07 NOTE — Telephone Encounter (Signed)
Sched appt 09/21/16 at 8:30. Spoke to pt's wife to inform them of appt.

## 2016-09-07 NOTE — Telephone Encounter (Signed)
-----   Message from Sharee Pimple, RN sent at 09/06/2016  9:55 AM EDT ----- Regarding: 2 weeks   ----- Message ----- From: Raymond Gurney, PA-C Sent: 09/04/2016   3:24 PM To: Vvs Charge Pool  s/p right carotid endarterectomy 09/04/16  f/u with Dr. Myra Gianotti in 2 weeks.  Thanks Selena Batten

## 2016-09-16 ENCOUNTER — Encounter: Payer: Self-pay | Admitting: Surgery

## 2016-09-21 ENCOUNTER — Ambulatory Visit (INDEPENDENT_AMBULATORY_CARE_PROVIDER_SITE_OTHER): Payer: Self-pay | Admitting: Surgery

## 2016-09-21 ENCOUNTER — Encounter: Payer: Self-pay | Admitting: Surgery

## 2016-09-21 VITALS — BP 120/80 | HR 93 | Temp 97.0°F | Resp 20 | Ht 73.0 in | Wt 273.0 lb

## 2016-09-21 DIAGNOSIS — I6521 Occlusion and stenosis of right carotid artery: Secondary | ICD-10-CM

## 2016-09-21 NOTE — Progress Notes (Signed)
Patient name: Shaun Pugh MRN: [id number] DOB: [birth year] Sex: male  REASON FOR VISIT:     post op  HISTORY OF PRESENT ILLNESS:   Shaun LionRoy Garay is a 66 y.o. male who is s/p right carotid endarterectomy04/27/2018.  Intraoperative findings included an ulcerated plaque at the bifurcation.  He had poor backbleeding from the internal carotid artery.  His procedure was done for symptomatic stenosis.  He has a history of a right brain stroke which is left him with left-sided weakness back in 2013.  This was secondary to aortic and innominate artery thrombus.  Previously, he had been on Coumadin but was changed to aspirin and Plavix.  He has been complaining of some pain at his incision.  He does not have any new neurologic deficits  CURRENT MEDICATIONS:    Current Outpatient Prescriptions  Medication Sig Dispense Refill  . aspirin EC 325 MG tablet Take 1 tablet (325 mg total) by mouth daily. 90 tablet 0  . calcium-vitamin D (OSCAL WITH D) 500-200 MG-UNIT tablet Take 1 tablet by mouth daily with breakfast.    . clobetasol cream (TEMOVATE) 0.05 % Apply 1 application topically 2 (two) times daily as needed.     . clopidogrel (PLAVIX) 75 MG tablet Take 1 tablet (75 mg total) by mouth daily. 90 tablet 3  . escitalopram (LEXAPRO) 5 MG tablet Take 1 tablet by mouth daily.  2  . fluticasone (CUTIVATE) 0.05 % cream Apply 1 application topically 2 (two) times daily as needed.     Marland Kitchen. glipiZIDE (GLUCOTROL XL) 10 MG 24 hr tablet Take 10 mg by mouth daily with breakfast.    . Insulin Glargine (TOUJEO SOLOSTAR) 300 UNIT/ML SOPN Inject 61 Units into the skin at bedtime.     Marland Kitchen. lisinopril-hydrochlorothiazide (PRINZIDE,ZESTORETIC) 10-12.5 MG tablet Take 1 tablet by mouth daily.  0  . Omega-3 Fatty Acids (FISH OIL) 1000 MG CAPS Take 1 capsule by mouth daily.    Marland Kitchen. oxyCODONE-acetaminophen (PERCOCET/ROXICET) 5-325 MG tablet Take 1-2 tablets by mouth every 6 (six) hours as needed for  moderate pain. 10 tablet 0  . pantoprazole (PROTONIX) 40 MG tablet Take 1 tablet by mouth daily.  2  . pravastatin (PRAVACHOL) 20 MG tablet Take 20 mg by mouth daily.   0  . vitamin B-12 (CYANOCOBALAMIN) 1000 MCG tablet Take 1,000 mcg by mouth daily.     No current facility-administered medications for this visit.     REVIEW OF SYSTEMS:   [X]  denotes positive finding, [ ]  denotes negative finding Cardiac  Comments:  Chest pain or chest pressure:    Shortness of breath upon exertion:    Short of breath when lying flat:    Irregular heart rhythm:    Constitutional    Fever or chills:      PHYSICAL EXAM:   Vitals:   09/21/16 0834  BP: 140/81  Pulse: 93  Resp: 20  Temp: 97 F (36.1 C)  TempSrc: Oral  SpO2: 95%  Weight: 273 lb (123.8 kg)  Height: 6\' 1"  (1.854 m)    GENERAL: The patient is a well-nourished male, in no acute distress. The vital signs are documented above. CARDIOVASCULAR: There is a regular rate and rhythm. PULMONARY: Non-labored respirations Incision is healing nicely  STUDIES:   None   MEDICAL ISSUES:   Status post right carotid endarterectomy for symptomatic stenosis.  The patient is at his baseline neurologic exam.  He is complaining of some discomfort which I think is typical in the postoperative period.  I have him scheduled for follow-up in 6 months with a repeat carotid duplex.  Durene Cal, MD [facility] [phone] Pager [phone]

## 2016-09-29 DIAGNOSIS — S61209A Unspecified open wound of unspecified finger without damage to nail, initial encounter: Secondary | ICD-10-CM | POA: Diagnosis not present

## 2016-09-29 NOTE — Addendum Note (Signed)
Addended by: Burton ApleyPETTY, Lylia Karn A on: 09/29/2016 09:28 AM   Modules accepted: Orders

## 2016-10-02 DIAGNOSIS — L03012 Cellulitis of left finger: Secondary | ICD-10-CM | POA: Diagnosis not present

## 2016-10-08 DIAGNOSIS — L03012 Cellulitis of left finger: Secondary | ICD-10-CM | POA: Diagnosis not present

## 2016-10-27 DIAGNOSIS — R072 Precordial pain: Secondary | ICD-10-CM | POA: Diagnosis not present

## 2016-10-27 DIAGNOSIS — R0782 Intercostal pain: Secondary | ICD-10-CM | POA: Diagnosis not present

## 2016-10-29 ENCOUNTER — Other Ambulatory Visit (HOSPITAL_COMMUNITY): Payer: Self-pay | Admitting: Internal Medicine

## 2016-10-29 ENCOUNTER — Ambulatory Visit (HOSPITAL_COMMUNITY)
Admission: RE | Admit: 2016-10-29 | Discharge: 2016-10-29 | Disposition: A | Discharge status: Home / Self Care | Payer: Medicare Other | Source: Ambulatory Visit | Attending: Internal Medicine | Admitting: Internal Medicine

## 2016-10-29 DIAGNOSIS — R072 Precordial pain: Secondary | ICD-10-CM | POA: Diagnosis not present

## 2016-10-29 DIAGNOSIS — Z9181 History of falling: Secondary | ICD-10-CM | POA: Diagnosis not present

## 2016-10-29 DIAGNOSIS — R0781 Pleurodynia: Secondary | ICD-10-CM | POA: Diagnosis not present

## 2016-10-29 DIAGNOSIS — W19XXXA Unspecified fall, initial encounter: Secondary | ICD-10-CM

## 2016-10-29 DIAGNOSIS — S299XXA Unspecified injury of thorax, initial encounter: Secondary | ICD-10-CM | POA: Diagnosis not present

## 2016-11-03 ENCOUNTER — Ambulatory Visit (INDEPENDENT_AMBULATORY_CARE_PROVIDER_SITE_OTHER): Payer: Medicare Other | Admitting: Neurology

## 2016-11-03 ENCOUNTER — Encounter: Payer: Self-pay | Admitting: Neurology

## 2016-11-03 VITALS — BP 125/73 | HR 93 | Wt 275.4 lb

## 2016-11-03 DIAGNOSIS — E784 Other hyperlipidemia: Secondary | ICD-10-CM | POA: Diagnosis not present

## 2016-11-03 DIAGNOSIS — E7849 Other hyperlipidemia: Secondary | ICD-10-CM

## 2016-11-03 DIAGNOSIS — I63432 Cerebral infarction due to embolism of left posterior cerebral artery: Secondary | ICD-10-CM

## 2016-11-03 DIAGNOSIS — Z9889 Other specified postprocedural states: Secondary | ICD-10-CM

## 2016-11-03 DIAGNOSIS — Z794 Long term (current) use of insulin: Secondary | ICD-10-CM

## 2016-11-03 DIAGNOSIS — I7410 Embolism and thrombosis of unspecified parts of aorta: Secondary | ICD-10-CM

## 2016-11-03 DIAGNOSIS — I748 Embolism and thrombosis of other arteries: Secondary | ICD-10-CM | POA: Diagnosis not present

## 2016-11-03 DIAGNOSIS — E1159 Type 2 diabetes mellitus with other circulatory complications: Secondary | ICD-10-CM

## 2016-11-03 DIAGNOSIS — I6521 Occlusion and stenosis of right carotid artery: Secondary | ICD-10-CM

## 2016-11-03 DIAGNOSIS — I10 Essential (primary) hypertension: Secondary | ICD-10-CM

## 2016-11-03 NOTE — Progress Notes (Signed)
STROKE NEUROLOGY FOLLOW UP NOTE  NAME: Alphonsus Doyel DOB: 06/26/1950  REASON FOR VISIT: stroke follow up HISTORY FROM: pt and wife and chart  Today we had the pleasure of seeing Daysen Gundrum in follow-up at our [facility]. Pt was accompanied by wife.   History Summary Mr. Mindy Behnken is a 66 y.o. male with history of right MCA stroke in 2013, CAD/MI s/p cardiac stent, DM, atrophy of the right kidney, HLD, OSA, HTN, and seizures was admitted on 03/13/16 for visual deficits. MRI acute left PCA infarct, and remote large right MCA infarct. MRA suspected right P2 occlusion or high-grade stenosis and suspected interval distal right vertebral artery occlusion. 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 Also right ICA high grade 80-90% stenosis, worsening than 2013 which was 60%. EF 50-55%, LDL 113 and A1C 6.3. His current PCA infarct was considered from subclavian A thrombus proximal to [facility] take off. His old MCA infarct could be due to aortic arch near innominate artery or due to right ICA stenosis. He was put on coumadin for anticoagulation as well as lipitor. He was advised no driving on discharge.  06/16/16 follow up - the patient has been doing better. He still has visual deficit and not driving. Not able to tolerate statin including crestor and lipitor. lipitor stopped. As per wife, with crestor and lipitor, pt complained of joint pain and weakness. Once statin stopped, symptoms resolved. Tried to use PCSK but too high copay. He had hematuria 03/26/16 due to high INR 4.1 with UTI. Coumadin was on hold briefly and resumed. Last INR 2.2. BP today 122/84. Finished outpt OT. No other complains.   Interval History During the interval time, pt has been stable from stroke standpoint. Had CTA neck repeat in 06/2016 showed improved left subclavian artery thrombus, unchanged aortic arch carotid dissection and  right ICA high-grade stenosis 80%. The left subclavian artery thrombus was comparable with that in 2013 better than that in 2017. Therefore, coumadin was discontinued and changed to ASA and plavix. He was referred to Dr. Myra Gianotti for right CEA which was done in 09/04/2016. Had incision pain post surgery, but improved. Not able to tolerate crestor or lipitor and now on low dose pravastatin, not able to afford copy for PCSK9 inhibitors. BP today 125/73. However his recent A1C in 08/2016 was 8.6, put back on insulin.   REVIEW OF SYSTEMS: Full 14 system review of systems performed and notable only for those listed below and in HPI above, all others are negative:  Constitutional:   Cardiovascular:  Ear/Nose/Throat:   Skin:  Eyes:  Light sensitivity, loss of vision Respiratory:  SOB Gastroitestinal:   Genitourinary: Difficulty urination Hematology/Lymphatic:   Endocrine:  Musculoskeletal:  Walking difficulty Allergy/Immunology:   Neurological:  Memory loss, dizziness, headache Psychiatric: Agitation, confusion, nervous/anxious Sleep: daytime sleepiness  The following represents the patient's updated allergies and side effects list: Allergies  Allergen Reactions  . Crestor [Rosuvastatin Calcium]     Joint pain and weakness   . Lipitor [Atorvastatin]     Joint pain and weakness    The neurologically relevant items on the patient's problem list were reviewed on today's visit.  Neurologic Examination  A problem focused neurological exam (12 or more points of the single system neurologic examination, vital signs counts as 1 point, cranial nerves count for 8 points) was performed.  Blood pressure 125/73, pulse 93, weight 275 lb  6.4 oz (124.9 kg).  General - obese, well developed, in no apparent distress.  Ophthalmologic - Fundi not visualized due to noncooperation.  Cardiovascular - Regular rate and rhythm with no murmur.  Mental Status -  Level of arousal and orientation to time, place,  and person were intact. Language including expression, naming, repetition, comprehension was assessed and found intact. Fund of Knowledge was assessed and was intact.  Cranial Nerves II - XII - II - Visual field exam showed b/l lower quadrantanopia. Left upper quadrant simultagnosia. III, IV, VI - Extraocular movements intact. V - Facial sensation intact bilaterally. VII - left facial droop which is old from previous stroke. VIII - Hearing & vestibular intact bilaterally. X - Palate elevates symmetrically. XI - Chin turning & shoulder shrug intact bilaterally. XII - Tongue protrusion intact.  Motor Strength - The patient's strength was normal in RUE and RLE, but LUE 3/5 proximal and distal, and 4/5 proximal LLE which is old from previous stroke.  Bulk was normal and fasciculations were absent.   Motor Tone - Muscle tone was assessed at the neck and appendages and was normal.  Reflexes - The patient's reflexes were 1+ in all extremities and he had no pathological reflexes.  Sensory - Light touch, temperature/pinprick were assessed and were normal.    Coordination - The patient had normal movements in the right hand with no ataxia or dysmetria.  Tremor was absent.  Gait and Station - walks with cane, hemiplegic gait on the left   Data reviewed: I personally reviewed the images and agree with the radiology interpretations.  CT of the brain  03/17/2012 No acute intracranial abnormality seen.  03/17/2012 Paranasal sinus mucosal thickening and fluid in the left mastoid air cells. Otherwise no acute intracranial pathology.  11/10/2013No significant change in the appearance of large right middle cerebral artery distribution infarct.   Ct Angio Head 03/17/2012 1. Decreased enhancement of the right MCA M2 branches of the anterior sylvian division. No major branch occlusion, no intracranial hemodynamically significant stenosis identified. Otherwise minimal to mild intracranial atherosclerosis.  2. Subtle cortical hypodensity in the anterior right superior frontal gyrus suspicious for ischemia in this setting. No mass effect or hemorrhage. 3. Age indeterminate but favor chronic lacunar infarct in the right thalamus.   Ct Angio Neck 03/17/2012 1. Ulcerated plaque at the right ICA origin resulting in 60% right ICA stenosis. 2. Findings suspicious for 10 mm of focal thrombus in the proximal aortic arch (see axial image 6). 3. Mild left carotid atherosclerosis. Dominant right vertebral artery. 4. See intracranial findings below. 5. Small 9 mm nodule in the right parotid gland is indeterminate.   2D Echocardiogram Left ventricle: The cavity size was normal. Wall thicknesswas increased in a pattern of moderate LVH. Systolicfunction was vigorous. The estimated ejection fraction wasin the range of 65% to 70%. Regional wall motioabnormalities cannot be excluded. Left ventriculardiastolic function parameters were normal.- Left atrium: The atrium was mildly dilated. Impressions:- No cardiac source of emboli was indentified   Carotid Doppler 2013: Right: 60-79% internal carotid artery stenosis. Left: No evidence of hemodynamically significant internal carotid artery stenosis. Bilateral: Vertebral artery flow is antegrade.   Ct Head Wo Contrast 03/13/2016 1. No acute hemorrhage or focal mass lesion visualized.  2. Evidence of old large right-sided hemispheric infarct.  3. Focal hypodense area within the left parasagittal occipital lobe, new since 2013, likely old given absence of significant surrounding mass effect, however MRI could be obtained to confirm that this does  not represent subacute ischemic change.  4. Old lacunar infarcts within the thalamus. Old right cerebellar infarct.   MR MRA Head / Brain Wo Contrast (motion degraded) 03/14/2016 1. Incomplete MRI. Acute left occipital lobe PCA infarct. 2. Large chronic right cerebral hemispheric infarct. 3. Motion degraded head MRA. P1 and  proximal left P2 segments are grossly patent. Suspected right P2 occlusion or high-grade stenosis. 4. Suspected interval distal right vertebral artery occlusion.  LE venous doppler - Bilateral: No evidence of DVT, superficial thrombosis, or Baker's Cyst.  CUS - Bilateral: 1-39% ICA stenosis. Vertebral artery flow is antegrade.  TTE - - Left ventricle: Poorly visualized. The cavity size was normal. Wall thickness was increased in a pattern of moderate LVH. Systolic function was normal. The estimated ejection fraction was in the range of 50% to 55%. Doppler parameters are consistent with abnormal left ventricular relaxation (grade 1 diastolic dysfunction).  Ct Angio Head and neck W Or Wo Contrast 03/16/2016 IMPRESSION: 1. Re- demonstration of findings of acute left PCA infarct. Chronic extensive right hemispheric encephalomalacia. 2. No intracranial arterial occlusion. 3. Severe stenosis of the proximal right intracranial carotid artery, measuring 80 and 90%, worsened from the prior study. This is secondary to large volume non calcified, eccentric atherosclerotic plaque. 4. Large amount of plaque vs chronic thrombus within the proximal left subclavian artery, slightly worsened compared to 03/17/2012. 5. Linear filling defect within the aortic arch is decreased compared to 03/17/2012.   CTA neck 06/23/16 Thrombus in the proximal left subclavian artery has improved. There remains plaque at the origin of left subclavian artery with mild stenosis Irregular plaque in the right carotid bulb is unchanged. This is narrowing the lumen by approximately 80% diameter stenosis Mild atherosclerotic disease left carotid bifurcation without significant carotid stenosis Both vertebral arteries are patent without significant stenosis. 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 Short segment linear filling defect proximal aortic  arch is unchanged, possible dissection   Component     Latest Ref Rng & Units 03/14/2016  Cholesterol     0 - 200 mg/dL 161  Triglycerides     <150 mg/dL 096 (H)  HDL Cholesterol     >40 mg/dL 25 (L)  Total CHOL/HDL Ratio     RATIO 7.6  VLDL     0 - 40 mg/dL 51 (H)  LDL (calc)     0 - 99 mg/dL 045 (H)  Hemoglobin W0J     4.8 - 5.6 % 6.2 (H)  Mean Plasma Glucose     mg/dL 811   Component     Latest Ref Rng & Units 09/01/2016  Hemoglobin A1C     4.8 - 5.6 % 8.6 (H)  Mean Plasma Glucose     mg/dL 914    Assessment: As you may recall, he is a 66 y.o. Caucasian male with PMH of right MCA stroke in 2013, CAD/MI s/p cardiac stent, DM, atrophy of the right kidney, HLD, OSA, HTN, and seizures was admitted on 03/13/16 for acute left PCA infarct, and remote large right MCA infarct. MRA suspected right P2 occlusion or high-grade stenosis and suspected interval distal right vertebral artery occlusion. 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 Also right ICA high grade 80-90% stenosis, worsening than 2013 which was 60%. EF 50-55%, LDL 113 and A1C 6.3. His current PCA infarct was  considered from subclavian A thrombus proximal to [facility] take off. His old MCA infarct could be due to aortic arch near innominate artery or due to right ICA stenosis. He was put on coumadin for anticoagulation as well as lipitor. He was advised no driving. He had hematuria 03/26/16 due to high INR 4.1 with UTI. Coumadin was on hold briefly and then resumed. CTA neck repeat in 06/2016 showed improved left subclavian artery thrombus, unchanged aortic arch carotid dissection and right ICA high-grade stenosis 80%. Therefore, coumadin was discontinued and changed to ASA and plavix. He was referred to Dr. Myra GianottiBrabham for right CEA which was done in 09/04/2016. Not able to tolerate crestor or lipitor and now on low dose pravastatin, not able  to afford copy for PCSK9 inhibitors. No driving at this time due to visual impairment. Pt compliant so far  Plan:  - continue ASA and plavix and pravastatin for stroke prevention - check BP and glucose at home and record - Follow up with your primary care physician for stroke risk factor modification. Recommend maintain blood pressure goal <140/80, diabetes with hemoglobin A1c goal below 7.0% and lipids with LDL cholesterol goal below 70 mg/dL.  - healthy diet and home exercise - continue follow up with Dr. Myra GianottiBrabham  - follow up in 6 months.  I spent more than 25 minutes of face to face time with the patient. Greater than 50% of time was spent in counseling and coordination of care. We discussed no driving, continue DAPT, follow up with [facility].    No orders of the defined types were placed in this encounter.   Meds ordered this encounter  Medications  . DISCONTD: HYDROcodone-acetaminophen (NORCO/VICODIN) 5-325 MG tablet    Refill:  0  . DISCONTD: doxycycline (VIBRA-TABS) 100 MG tablet    Refill:  0    Patient Instructions  - continue ASA and plavix for stroke prevention - start pravastatin for HLD and stroke prevention - check BP and glucose at home and record - Follow up with your primary care physician for stroke risk factor modification. Recommend maintain blood pressure goal <140/80, diabetes with hemoglobin A1c goal below 7.0% and lipids with LDL cholesterol goal below 70 mg/dL.  - healthy diet and home exercise - continue follow up with Dr. Myra GianottiBrabham  - follow up in 6 months.   Marvel PlanJindong Waldo Damian, MD PhD [facility] [address], [address] [place], [place] [address] [phone]

## 2016-11-03 NOTE — Patient Instructions (Addendum)
-   continue ASA and plavix for stroke prevention - continue pravastatin for HLD and stroke prevention - check BP and glucose at home and record - Follow up with your primary care physician for stroke risk factor modification. Recommend maintain blood pressure goal <140/80, diabetes with hemoglobin A1c goal below 7.0% and lipids with LDL cholesterol goal below 70 mg/dL.  - healthy diet and home exercise - continue follow up with Dr. Myra GianottiBrabham  - follow up in 6 months.

## 2016-11-04 DIAGNOSIS — Z9889 Other specified postprocedural states: Secondary | ICD-10-CM | POA: Insufficient documentation

## 2016-11-12 DIAGNOSIS — I10 Essential (primary) hypertension: Secondary | ICD-10-CM | POA: Diagnosis not present

## 2016-11-12 DIAGNOSIS — E1165 Type 2 diabetes mellitus with hyperglycemia: Secondary | ICD-10-CM | POA: Diagnosis not present

## 2016-11-16 DIAGNOSIS — E1165 Type 2 diabetes mellitus with hyperglycemia: Secondary | ICD-10-CM | POA: Diagnosis not present

## 2016-11-16 DIAGNOSIS — I69359 Hemiplegia and hemiparesis following cerebral infarction affecting unspecified side: Secondary | ICD-10-CM | POA: Diagnosis not present

## 2016-11-16 DIAGNOSIS — N183 Chronic kidney disease, stage 3 (moderate): Secondary | ICD-10-CM | POA: Diagnosis not present

## 2016-11-16 DIAGNOSIS — I639 Cerebral infarction, unspecified: Secondary | ICD-10-CM | POA: Diagnosis not present

## 2016-11-20 DIAGNOSIS — E1122 Type 2 diabetes mellitus with diabetic chronic kidney disease: Secondary | ICD-10-CM | POA: Diagnosis not present

## 2016-11-20 DIAGNOSIS — I69351 Hemiplegia and hemiparesis following cerebral infarction affecting right dominant side: Secondary | ICD-10-CM | POA: Diagnosis not present

## 2016-11-20 DIAGNOSIS — I69369 Other paralytic syndrome following cerebral infarction affecting unspecified side: Secondary | ICD-10-CM | POA: Diagnosis not present

## 2016-11-20 DIAGNOSIS — Z9181 History of falling: Secondary | ICD-10-CM | POA: Diagnosis not present

## 2016-11-20 DIAGNOSIS — N183 Chronic kidney disease, stage 3 (moderate): Secondary | ICD-10-CM | POA: Diagnosis not present

## 2016-11-20 DIAGNOSIS — I119 Hypertensive heart disease without heart failure: Secondary | ICD-10-CM | POA: Diagnosis not present

## 2016-11-20 DIAGNOSIS — E1165 Type 2 diabetes mellitus with hyperglycemia: Secondary | ICD-10-CM | POA: Diagnosis not present

## 2016-11-30 DIAGNOSIS — E1165 Type 2 diabetes mellitus with hyperglycemia: Secondary | ICD-10-CM | POA: Diagnosis not present

## 2016-11-30 DIAGNOSIS — I69359 Hemiplegia and hemiparesis following cerebral infarction affecting unspecified side: Secondary | ICD-10-CM | POA: Diagnosis not present

## 2016-11-30 DIAGNOSIS — I10 Essential (primary) hypertension: Secondary | ICD-10-CM | POA: Diagnosis not present

## 2016-11-30 DIAGNOSIS — I639 Cerebral infarction, unspecified: Secondary | ICD-10-CM | POA: Diagnosis not present

## 2016-12-22 DIAGNOSIS — E119 Type 2 diabetes mellitus without complications: Secondary | ICD-10-CM | POA: Diagnosis not present

## 2016-12-23 DIAGNOSIS — R319 Hematuria, unspecified: Secondary | ICD-10-CM | POA: Diagnosis not present

## 2016-12-23 DIAGNOSIS — N390 Urinary tract infection, site not specified: Secondary | ICD-10-CM | POA: Diagnosis not present

## 2016-12-31 DIAGNOSIS — R39198 Other difficulties with micturition: Secondary | ICD-10-CM | POA: Diagnosis not present

## 2016-12-31 DIAGNOSIS — E1165 Type 2 diabetes mellitus with hyperglycemia: Secondary | ICD-10-CM | POA: Diagnosis not present

## 2017-02-01 DIAGNOSIS — M79642 Pain in left hand: Secondary | ICD-10-CM | POA: Diagnosis not present

## 2017-02-05 DIAGNOSIS — E1122 Type 2 diabetes mellitus with diabetic chronic kidney disease: Secondary | ICD-10-CM | POA: Diagnosis not present

## 2017-02-05 DIAGNOSIS — Z9181 History of falling: Secondary | ICD-10-CM | POA: Diagnosis not present

## 2017-02-05 DIAGNOSIS — E1165 Type 2 diabetes mellitus with hyperglycemia: Secondary | ICD-10-CM | POA: Diagnosis not present

## 2017-02-05 DIAGNOSIS — N183 Chronic kidney disease, stage 3 (moderate): Secondary | ICD-10-CM | POA: Diagnosis not present

## 2017-02-05 DIAGNOSIS — I119 Hypertensive heart disease without heart failure: Secondary | ICD-10-CM | POA: Diagnosis not present

## 2017-02-05 DIAGNOSIS — I69351 Hemiplegia and hemiparesis following cerebral infarction affecting right dominant side: Secondary | ICD-10-CM | POA: Diagnosis not present

## 2017-02-22 ENCOUNTER — Ambulatory Visit (HOSPITAL_COMMUNITY)
Admission: RE | Admit: 2017-02-22 | Discharge: 2017-02-22 | Disposition: A | Discharge status: Home / Self Care | Payer: Medicare Other | Source: Ambulatory Visit | Attending: Surgery | Admitting: Surgery

## 2017-02-22 ENCOUNTER — Encounter: Payer: Self-pay | Admitting: Family

## 2017-02-22 ENCOUNTER — Ambulatory Visit (INDEPENDENT_AMBULATORY_CARE_PROVIDER_SITE_OTHER): Payer: Medicare Other | Admitting: Family

## 2017-02-22 VITALS — BP 117/77 | HR 97 | Temp 97.5°F | Resp 20 | Ht 73.0 in | Wt 272.0 lb

## 2017-02-22 DIAGNOSIS — I6521 Occlusion and stenosis of right carotid artery: Secondary | ICD-10-CM

## 2017-02-22 DIAGNOSIS — I6522 Occlusion and stenosis of left carotid artery: Secondary | ICD-10-CM | POA: Insufficient documentation

## 2017-02-22 DIAGNOSIS — Z9889 Other specified postprocedural states: Secondary | ICD-10-CM | POA: Diagnosis not present

## 2017-02-22 DIAGNOSIS — Z87891 Personal history of nicotine dependence: Secondary | ICD-10-CM

## 2017-02-22 LAB — VAS US CAROTID
LCCADDIAS: 15 cm/s
LCCADSYS: 45 cm/s
LCCAPSYS: 88 cm/s
LEFT ECA DIAS: -24 cm/s
Left CCA prox dias: 21 cm/s
Left ICA dist dias: -20 cm/s
Left ICA dist sys: -50 cm/s
Left ICA prox dias: -23 cm/s
Left ICA prox sys: -63 cm/s
RCCADSYS: -44 cm/s
RCCAPSYS: 98 cm/s
RIGHT CCA MID DIAS: 14 cm/s
RIGHT ECA DIAS: -17 cm/s
Right CCA prox dias: 17 cm/s

## 2017-02-22 NOTE — Patient Instructions (Signed)
Stroke Prevention Some medical conditions and behaviors are associated with an increased chance of having a stroke. You may prevent a stroke by making healthy choices and managing medical conditions. How can I reduce my risk of having a stroke?  Stay physically active. Get at least 30 minutes of activity on most or all days.  Do not smoke. It may also be helpful to avoid exposure to secondhand smoke.  Limit alcohol use. Moderate alcohol use is considered to be: ? No more than 2 drinks per day for men. ? No more than 1 drink per day for nonpregnant women.  Eat healthy foods. This involves: ? Eating 5 or more servings of fruits and vegetables a day. ? Making dietary changes that address high blood pressure (hypertension), high cholesterol, diabetes, or obesity.  Manage your cholesterol levels. ? Making food choices that are high in fiber and low in saturated fat, trans fat, and cholesterol may control cholesterol levels. ? Take any prescribed medicines to control cholesterol as directed by your health care provider.  Manage your diabetes. ? Controlling your carbohydrate and sugar intake is recommended to manage diabetes. ? Take any prescribed medicines to control diabetes as directed by your health care provider.  Control your hypertension. ? Making food choices that are low in salt (sodium), saturated fat, trans fat, and cholesterol is recommended to manage hypertension. ? Ask your health care provider if you need treatment to lower your blood pressure. Take any prescribed medicines to control hypertension as directed by your health care provider. ? If you are 18-39 years of age, have your blood pressure checked every 3-5 years. If you are 40 years of age or older, have your blood pressure checked every year.  Maintain a healthy weight. ? Reducing calorie intake and making food choices that are low in sodium, saturated fat, trans fat, and cholesterol are recommended to manage  weight.  Stop drug abuse.  Avoid taking birth control pills. ? Talk to your health care provider about the risks of taking birth control pills if you are over 35 years old, smoke, get migraines, or have ever had a blood clot.  Get evaluated for sleep disorders (sleep apnea). ? Talk to your health care provider about getting a sleep evaluation if you snore a lot or have excessive sleepiness.  Take medicines only as directed by your health care provider. ? For some people, aspirin or blood thinners (anticoagulants) are helpful in reducing the risk of forming abnormal blood clots that can lead to stroke. If you have the irregular heart rhythm of atrial fibrillation, you should be on a blood thinner unless there is a good reason you cannot take them. ? Understand all your medicine instructions.  Make sure that other conditions (such as anemia or atherosclerosis) are addressed. Get help right away if:  You have sudden weakness or numbness of the face, arm, or leg, especially on one side of the body.  Your face or eyelid droops to one side.  You have sudden confusion.  You have trouble speaking (aphasia) or understanding.  You have sudden trouble seeing in one or both eyes.  You have sudden trouble walking.  You have dizziness.  You have a loss of balance or coordination.  You have a sudden, severe headache with no known cause.  You have new chest pain or an irregular heartbeat. Any of these symptoms may represent a serious problem that is an emergency. Do not wait to see if the symptoms will go away.   Get medical help at once. Call your local emergency services (911 in [place] Do not drive yourself to the hospital. This information is not intended to replace advice given to you by your health care provider. Make sure you discuss any questions you have with your health care provider. Document Released: 06/04/2004 Document Revised: 10/03/2015 Document Reviewed: 10/28/2012 Elsevier  Interactive Patient Education  2017 [employer] Inc.     Preventing Cerebrovascular Disease Arteries are blood vessels that carry blood that contains oxygen from the heart to all parts of the body. Cerebrovascular disease affects arteries that supply the brain. Any condition that blocks or disrupts blood flow to the brain can cause cerebrovascular disease. Brain cells that lose blood supply start to die within minutes (stroke). Stroke is the main danger of cerebrovascular disease. Atherosclerosis and high blood pressure are common causes of cerebrovascular disease. Atherosclerosis is narrowing and hardening of an artery that results when fat, cholesterol, calcium, or other substances (plaque) build up inside an artery. Plaque reduces blood flow through the artery. High blood pressure increases the risk of bleeding inside the brain. Making diet and lifestyle changes to prevent atherosclerosis and high blood pressure lowers your risk of cerebrovascular disease. What nutrition changes can be made?  Eat more fruits, vegetables, and whole grains.  Reduce how much saturated fat you eat. To do this, eat less red meat and fewer full-fat dairy products.  Eat healthy proteins instead of red meat. Healthy proteins include: ? Fish. Eat fish that contains heart-healthy omega-3 fatty acids, twice a week. Examples include salmon, albacore tuna, mackerel, and herring. ? Chicken. ? Nuts. ? Low-fat or nonfat yogurt.  Avoid processed meats, like bacon and lunchmeat.  Avoid foods that contain: ? A lot of sugar, such as sweets and drinks with added sugar. ? A lot of salt (sodium). Avoid adding extra salt to your food, as told by your health care provider. ? Trans fats, such as margarine and baked goods. Trans fats may be listed as "partially hydrogenated oils" on food labels.  Check food labels to see how much sodium, sugar, and trans fats are in foods.  Use vegetable oils that contain low amounts of  saturated fat, such as olive oil or canola oil. What lifestyle changes can be made?  Drink alcohol in moderation. This means no more than 1 drink a day for nonpregnant women and 2 drinks a day for men. One drink equals 12 oz of beer, 5 oz of wine, or 1 oz of hard liquor.  If you are overweight, ask your health care provider to recommend a weight-loss plan for you. Losing 5-10 lb (2.2-4.5 kg) can reduce your risk of diabetes, atherosclerosis, and high blood pressure.  Exercise for 30?60 minutes on most days, or as much as told by your health care provider. ? Do moderate-intensity exercise, such as brisk walking, bicycling, and water aerobics. Ask your health care provider which activities are safe for you.  Do not use any products that contain nicotine or tobacco, such as cigarettes and e-cigarettes. If you need help quitting, ask your health care provider. Why are these changes important? Making these changes lowers your risk of many diseases that can cause cerebrovascular disease and stroke. Stroke is a leading cause of death and disability. Making these changes also improves your overall health and quality of life. What can I do to lower my risk? The following factors make you more likely to develop cerebrovascular disease:  Being overweight.  Smoking.  Being physically inactive.    Eating a high-fat diet.  Having certain health conditions, such as: ? Diabetes. ? High blood pressure. ? Heart disease. ? Atherosclerosis. ? High cholesterol. ? Sickle cell disease.  Talk with your health care provider about your risk for cerebrovascular disease. Work with your health care provider to control diseases that you have that may contribute to cerebrovascular disease. Your health care provider may prescribe medicines to help prevent major causes of cerebrovascular disease. Where to find more information: Learn more about preventing cerebrovascular disease from:  National Heart, Lung, and  Blood Institute: www.nhlbi.nih.gov/health/health-topics/topics/stroke  Centers for Disease Control and Prevention: cdc.gov/stroke/about.htm  Summary  Cerebrovascular disease can lead to a stroke.  Atherosclerosis and high blood pressure are major causes of cerebrovascular disease.  Making diet and lifestyle changes can reduce your risk of cerebrovascular disease.  Work with your health care provider to get your risk factors under control to reduce your risk of cerebrovascular disease. This information is not intended to replace advice given to you by your health care provider. Make sure you discuss any questions you have with your health care provider. Document Released: 05/12/2015 Document Revised: 11/15/2015 Document Reviewed: 05/12/2015 Elsevier Interactive Patient Education  2018 Elsevier Inc.  

## 2017-02-22 NOTE — Progress Notes (Signed)
Chief Complaint: Follow up Extracranial Carotid Artery Stenosis   History of Present Illness  Shaun Pugh is a 66 y.o. male who is s/p right CEA on 09/04/2016 by Dr. Myra Gianotti.  Intraoperative findings included an ulcerated plaque at the bifurcation.  He had poor backbleeding from the internal carotid artery.  His procedure was done for symptomatic stenosis.  He has a history of a right brain stroke which left him with left-sided weakness back in 2013.  This was secondary to aortic and innominate artery thrombus.   Wife states pt had another stroke in November 2017 that caused visual problems in both eyes: limited bilateral peripheral vision and loss of vision in the lower half of both visual fields.  He and wife deny any known subsequent stroke or TIA sx's.  Previously, he had been on Coumadin but was changed to aspirin and Plavix.  Dr. Gerald Leitz last evaluated pt on 09-21-16. At that time the patient was at his baseline neurologic exam.  He was complaining of some neck discomfort which was typical in the postoperative period. Dr. Myra Gianotti advised follow-up in 6 months with a repeat carotid duplex.  He is not able to tolerate Crestor or Lipitor and now on low dose pravastatin, not able to afford copy for PCSK9 inhibitors. He is followed by Dr. Roda Shutters, neurologist.    Pt Diabetic: yes, last A1C result on file was 8.6 on 09-01-16; wife states his DM has improved a little since then, but still not at goal Pt smoker: former smoker, quit in 2013, smoked x 45 years   Pt meds include: Statin : no, caused myalgias in legs, even with low doses less than daily ASA: yes Other anticoagulants/antiplatelets: Plavix   Past Medical History:  Diagnosis Date  . Acute pancreatitis 09/14/2011  . Atrophy of right kidney 09/15/2011  . CAD (coronary artery disease)    DES to mid LAD and distal RCA 2005 - Dr. Amil Amen  . Carotid artery disease (HCC)   . Chronic back pain   . Essential hypertension   . History of  stroke 03/18/2012   Aortic atheroma implicated, was on Coumadin for a period of time and more recently aspirin and Plavix  . Hyperlipidemia   . Kidney calculi 09/15/2011  . Myocardial infarction (HCC)   . Seizures (HCC)    Following stroke  . Sleep apnea    Reportedly intolerant of CPAP  . Type 2 diabetes mellitus (HCC)     Social History Social History  Substance Use Topics  . Smoking status: Former Smoker    Packs/day: 1.00    Years: 45.00    Types: Cigarettes    Quit date: 03/18/2012  . Smokeless tobacco: Never Used  . Alcohol use No    Family History Family History  Problem Relation Age of Onset  . Congestive Heart Failure Mother 49  . Diabetes Father 69  . Heart attack Father   . Anesthesia problems Neg Hx   . Hypotension Neg Hx   . Malignant hyperthermia Neg Hx   . Pseudochol deficiency Neg Hx     Surgical History Past Surgical History:  Procedure Laterality Date  . BACK SURGERY     LUMBAR SURGERIES x 4 - INCLUDING FUSIONS  . CARPAL TUNNEL REPAIR Left   . CHOLECYSTECTOMY  09/25/2011   Procedure: LAPAROSCOPIC CHOLECYSTECTOMY;  Surgeon: Fabio Bering, MD;  Location: [facility];  Service: General;  Laterality: N/A;  . CYSTOSCOPY WITH RETROGRADE PYELOGRAM, URETEROSCOPY AND STENT PLACEMENT Right 05/26/2013  Procedure: CYSTOSCOPY, RIGHT RETROGRADE PYELOGRAM, RIGHT URETEROSCOPY AND RIGHT STENT PLACEMENT, stone extraction, biopsy;  Surgeon: Crist Fat, MD;  Location: [facility];  Service: Urology;  Laterality: Right;  . ENDARTERECTOMY Right 09/04/2016   Procedure: ENDARTERECTOMY CAROTID;  Surgeon: Nada Libman, MD;  Location: [facility];  Service: Vascular;  Laterality: Right;  . HERNIA REPAIR     INGUINAL HERNIA REPARI MAYBE 30 YRS AGO  . MULTIPLE TOOTH EXTRACTIONS      Allergies  Allergen Reactions  . Crestor [Rosuvastatin Calcium]     Joint pain and weakness   . Lipitor [Atorvastatin]     Joint pain and weakness    Current Outpatient Prescriptions   Medication Sig Dispense Refill  . aspirin EC 325 MG tablet Take 1 tablet (325 mg total) by mouth daily. 90 tablet 0  . calcium-vitamin D (OSCAL WITH D) 500-200 MG-UNIT tablet Take 1 tablet by mouth daily with breakfast.    . clobetasol cream (TEMOVATE) 0.05 % Apply 1 application topically 2 (two) times daily as needed.     . clopidogrel (PLAVIX) 75 MG tablet Take 1 tablet (75 mg total) by mouth daily. 90 tablet 3  . escitalopram (LEXAPRO) 5 MG tablet Take 10 mg by mouth daily.   2  . fluticasone (CUTIVATE) 0.05 % cream Apply 1 application topically 2 (two) times daily as needed.     Marland Kitchen glipiZIDE (GLUCOTROL XL) 10 MG 24 hr tablet Take 10 mg by mouth daily with breakfast.    . Insulin Glargine (TOUJEO SOLOSTAR) 300 UNIT/ML SOPN Inject 80 Units into the skin at bedtime.     Marland Kitchen lisinopril-hydrochlorothiazide (PRINZIDE,ZESTORETIC) 10-12.5 MG tablet Take 0.5 tablets by mouth daily.   0  . Omega-3 Fatty Acids (FISH OIL) 1000 MG CAPS Take 1 capsule by mouth daily.    . pantoprazole (PROTONIX) 40 MG tablet Take 1 tablet by mouth daily.  2  . pravastatin (PRAVACHOL) 20 MG tablet Take 20 mg by mouth every other day.   0  . vitamin B-12 (CYANOCOBALAMIN) 1000 MCG tablet Take 1,000 mcg by mouth daily.     No current facility-administered medications for this visit.     Review of Systems : See HPI for pertinent positives and negatives.  Physical Examination  Vitals:   02/22/17 1141 02/22/17 1143  BP: 122/73 117/77  Pulse: 97   Resp: 20   Temp: (!) 97.5 F (36.4 C)   TempSrc: Oral   SpO2: 97%   Weight: 272 lb (123.4 kg)   Height:  (1.854 m)    Body mass index is 35.89 kg/m.  General: WDWN obese male in NAD GAIT: mildly spastic, using cane Eyes: PERRLA Pulmonary:  Respirations are non-labored, good air movement, CTAB, no rales, rhonchi, or wheezing.  Cardiac: regular rhythm, no detected murmur.  VASCULAR EXAM Carotid Bruits Right Left   Negative Negative     Abdominal aortic pulse  is not palpable. Radial pulses are 1+ palpable and equal.  LE Pulses Right Left       POPLITEAL  not palpable   not palpable       POSTERIOR TIBIAL  1+ palpable   2+ palpable        DORSALIS PEDIS      ANTERIOR TIBIAL not palpable  1+ palpable     Gastrointestinal: soft, nontender, BS WNL, no r/g, no palpable masses.  Musculoskeletal: + muscle atrophy/wasting in left upper extremity. M/S 5/5 in right upper and lower extremities, 3/5 in left upper and lower extremities, extremities without ischemic changes.  Neurologic:  A&O X 3; appropriate affect, sensation is diminished in left upper and lower extremties; speech is normal, CN 2-12 intact, motor exam as listed above.    Assessment: Shaun Pugh is a 66 y.o. male who  is s/p right CEA on 09/04/2016. He had a stroke in 2013 with residual left hemiparesis, and November of 2017 with residual partial loss of visual fields, no stroke or TIA since then.   His atherosclerotic risk factors include uncontrolled DM, 45 year smoking hx (quit in 2013), CAD with hx of MI and CABG, statin intolerance, and obesity.   DATA Carotid Duplex (02/22/17): Right ICA: CEA site with no evidence of restenosis or hyperplasia.  Left ICA: 1-39% stenosis.  Bilateral vertebral artery flow is antegrade.  Bilateral subclavian artery waveforms are normal.  Technically difficult due to body habitus and breathing patterns.  Improvement in right ICA, stable left ICA since the exam on 08-24-16.    Plan: Follow-up in 6 months with Carotid Duplex scan.   I discussed in depth with the patient the nature of atherosclerosis, and emphasized the importance of maximal medical management including strict control of blood pressure, blood glucose, and lipid levels, obtaining regular exercise, and continued cessation of smoking.  The patient is aware that  without maximal medical management the underlying atherosclerotic disease process will progress, limiting the benefit of any interventions. The patient was given information about stroke prevention and what symptoms should prompt the patient to seek immediate medical care. Thank you for allowing [place] to participate in this patient's care.  Charisse March, RN, MSN, FNP-C [facility] Office: [phone]  Clinic Physician: Myra Gianotti  02/22/17 11:48 AM

## 2017-02-23 NOTE — Addendum Note (Signed)
Addended by: Burton Apley A on: 02/23/2017 03:59 PM   Modules accepted: Orders

## 2017-03-16 DIAGNOSIS — N390 Urinary tract infection, site not specified: Secondary | ICD-10-CM | POA: Diagnosis not present

## 2017-03-29 DIAGNOSIS — I10 Essential (primary) hypertension: Secondary | ICD-10-CM | POA: Diagnosis not present

## 2017-03-29 DIAGNOSIS — E1165 Type 2 diabetes mellitus with hyperglycemia: Secondary | ICD-10-CM | POA: Diagnosis not present

## 2017-03-29 DIAGNOSIS — E782 Mixed hyperlipidemia: Secondary | ICD-10-CM | POA: Diagnosis not present

## 2017-03-31 DIAGNOSIS — E1165 Type 2 diabetes mellitus with hyperglycemia: Secondary | ICD-10-CM | POA: Diagnosis not present

## 2017-03-31 DIAGNOSIS — I69369 Other paralytic syndrome following cerebral infarction affecting unspecified side: Secondary | ICD-10-CM | POA: Diagnosis not present

## 2017-03-31 DIAGNOSIS — E1122 Type 2 diabetes mellitus with diabetic chronic kidney disease: Secondary | ICD-10-CM | POA: Diagnosis not present

## 2017-03-31 DIAGNOSIS — E782 Mixed hyperlipidemia: Secondary | ICD-10-CM | POA: Diagnosis not present

## 2017-03-31 DIAGNOSIS — I10 Essential (primary) hypertension: Secondary | ICD-10-CM | POA: Diagnosis not present

## 2017-03-31 DIAGNOSIS — L409 Psoriasis, unspecified: Secondary | ICD-10-CM | POA: Diagnosis not present

## 2017-04-21 ENCOUNTER — Ambulatory Visit: Payer: Medicare Other | Admitting: Neurology

## 2017-04-27 DIAGNOSIS — E119 Type 2 diabetes mellitus without complications: Secondary | ICD-10-CM | POA: Diagnosis not present

## 2017-05-18 DIAGNOSIS — N319 Neuromuscular dysfunction of bladder, unspecified: Secondary | ICD-10-CM | POA: Diagnosis not present

## 2017-05-18 DIAGNOSIS — N400 Enlarged prostate without lower urinary tract symptoms: Secondary | ICD-10-CM | POA: Diagnosis not present

## 2017-05-31 DIAGNOSIS — R339 Retention of urine, unspecified: Secondary | ICD-10-CM | POA: Diagnosis not present

## 2017-06-16 ENCOUNTER — Other Ambulatory Visit: Payer: Self-pay | Admitting: Neurology

## 2017-06-16 DIAGNOSIS — I63432 Cerebral infarction due to embolism of left posterior cerebral artery: Secondary | ICD-10-CM

## 2017-06-16 DIAGNOSIS — Z8673 Personal history of transient ischemic attack (TIA), and cerebral infarction without residual deficits: Secondary | ICD-10-CM

## 2017-06-21 DIAGNOSIS — N139 Obstructive and reflux uropathy, unspecified: Secondary | ICD-10-CM | POA: Diagnosis not present

## 2017-06-21 DIAGNOSIS — I6789 Other cerebrovascular disease: Secondary | ICD-10-CM | POA: Diagnosis not present

## 2017-06-21 DIAGNOSIS — N400 Enlarged prostate without lower urinary tract symptoms: Secondary | ICD-10-CM | POA: Diagnosis not present

## 2017-06-28 DIAGNOSIS — E1122 Type 2 diabetes mellitus with diabetic chronic kidney disease: Secondary | ICD-10-CM | POA: Diagnosis not present

## 2017-06-28 DIAGNOSIS — E6609 Other obesity due to excess calories: Secondary | ICD-10-CM | POA: Diagnosis not present

## 2017-06-28 DIAGNOSIS — I69369 Other paralytic syndrome following cerebral infarction affecting unspecified side: Secondary | ICD-10-CM | POA: Diagnosis not present

## 2017-06-28 DIAGNOSIS — Z6838 Body mass index (BMI) 38.0-38.9, adult: Secondary | ICD-10-CM | POA: Diagnosis not present

## 2017-06-30 ENCOUNTER — Ambulatory Visit: Payer: Medicare Other | Admitting: Neurology

## 2017-06-30 DIAGNOSIS — E1122 Type 2 diabetes mellitus with diabetic chronic kidney disease: Secondary | ICD-10-CM | POA: Diagnosis not present

## 2017-06-30 DIAGNOSIS — I69369 Other paralytic syndrome following cerebral infarction affecting unspecified side: Secondary | ICD-10-CM | POA: Diagnosis not present

## 2017-06-30 DIAGNOSIS — I129 Hypertensive chronic kidney disease with stage 1 through stage 4 chronic kidney disease, or unspecified chronic kidney disease: Secondary | ICD-10-CM | POA: Diagnosis not present

## 2017-06-30 DIAGNOSIS — N183 Chronic kidney disease, stage 3 (moderate): Secondary | ICD-10-CM | POA: Diagnosis not present

## 2017-07-20 DIAGNOSIS — M79645 Pain in left finger(s): Secondary | ICD-10-CM | POA: Diagnosis not present

## 2017-07-20 DIAGNOSIS — Z0001 Encounter for general adult medical examination with abnormal findings: Secondary | ICD-10-CM | POA: Diagnosis not present

## 2017-07-20 DIAGNOSIS — Z6836 Body mass index (BMI) 36.0-36.9, adult: Secondary | ICD-10-CM | POA: Diagnosis not present

## 2017-07-20 DIAGNOSIS — L03012 Cellulitis of left finger: Secondary | ICD-10-CM | POA: Diagnosis not present

## 2017-07-26 ENCOUNTER — Encounter (INDEPENDENT_AMBULATORY_CARE_PROVIDER_SITE_OTHER): Payer: Self-pay

## 2017-07-26 ENCOUNTER — Ambulatory Visit: Payer: Medicare Other | Admitting: Neurology

## 2017-07-26 ENCOUNTER — Encounter: Payer: Self-pay | Admitting: Neurology

## 2017-07-26 VITALS — BP 133/76 | HR 88 | Wt 268.2 lb

## 2017-07-26 DIAGNOSIS — E7849 Other hyperlipidemia: Secondary | ICD-10-CM | POA: Diagnosis not present

## 2017-07-26 DIAGNOSIS — Z9889 Other specified postprocedural states: Secondary | ICD-10-CM

## 2017-07-26 DIAGNOSIS — M629 Disorder of muscle, unspecified: Secondary | ICD-10-CM | POA: Diagnosis not present

## 2017-07-26 DIAGNOSIS — I748 Embolism and thrombosis of other arteries: Secondary | ICD-10-CM | POA: Diagnosis not present

## 2017-07-26 DIAGNOSIS — I63432 Cerebral infarction due to embolism of left posterior cerebral artery: Secondary | ICD-10-CM | POA: Diagnosis not present

## 2017-07-26 DIAGNOSIS — I10 Essential (primary) hypertension: Secondary | ICD-10-CM | POA: Diagnosis not present

## 2017-07-26 DIAGNOSIS — Z794 Long term (current) use of insulin: Secondary | ICD-10-CM

## 2017-07-26 DIAGNOSIS — M6281 Muscle weakness (generalized): Secondary | ICD-10-CM | POA: Diagnosis not present

## 2017-07-26 DIAGNOSIS — M25642 Stiffness of left hand, not elsewhere classified: Secondary | ICD-10-CM | POA: Diagnosis not present

## 2017-07-26 DIAGNOSIS — L608 Other nail disorders: Secondary | ICD-10-CM | POA: Diagnosis not present

## 2017-07-26 DIAGNOSIS — E1159 Type 2 diabetes mellitus with other circulatory complications: Secondary | ICD-10-CM | POA: Diagnosis not present

## 2017-07-26 NOTE — Progress Notes (Signed)
STROKE NEUROLOGY FOLLOW UP NOTE  NAME: Shaun Pugh DOB: 1951-04-15  REASON FOR VISIT: stroke follow up HISTORY FROM: pt and wife and chart  Today we had the pleasure of seeing Shaun Pugh in follow-up at our Neurology Clinic. Pt was accompanied by wife.   History Summary Shaun Pugh is a 67 y.o. male with history of right MCA stroke in 2013, CAD/MI s/p cardiac stent, DM, atrophy of the right kidney, HLD, OSA, HTN, and seizures was admitted on 03/13/16 for visual deficits. MRI acute left PCA infarct, and remote large right MCA infarct. MRA suspected right P2 occlusion or high-grade stenosis and suspected interval distal right vertebral artery occlusion. 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 Also right ICA high grade 80-90% stenosis, worsening than 2013 which was 60%. EF 50-55%, LDL 113 and A1C 6.3. His current PCA infarct was considered from subclavian A thrombus proximal to [facility] take off. His old MCA infarct could be due to aortic arch near innominate artery or due to right ICA stenosis. He was put on coumadin for anticoagulation as well as lipitor. He was advised no driving on discharge.  06/16/16 follow up - the patient has been doing better. He still has visual deficit and not driving. Not able to tolerate statin including crestor and lipitor. lipitor stopped. As per wife, with crestor and lipitor, pt complained of joint pain and weakness. Once statin stopped, symptoms resolved. Tried to use PCSK but too high copay. He had hematuria 03/26/16 due to high INR 4.1 with UTI. Coumadin was on hold briefly and resumed. Last INR 2.2. BP today 122/84. Finished outpt OT. No other complains.   11/03/16 follow up - pt has been stable from stroke standpoint. Had CTA neck repeat in 06/2016 showed improved left subclavian artery thrombus, unchanged aortic arch carotid dissection and right ICA high-grade  stenosis 80%. The left subclavian artery thrombus was comparable with that in 2013 better than that in 2017. Therefore, coumadin was discontinued and changed to ASA and plavix. He was referred to Dr. Myra GianottiBrabham for right CEA which was done in 09/04/2016. Had incision pain post surgery, but improved. Not able to tolerate crestor or lipitor and now on low dose pravastatin, not able to afford copy for PCSK9 inhibitors. BP today 125/73. However his recent A1C in 08/2016 was 8.6, put back on insulin.  Interval History During the interval time, pt has been doing well. No stroke symptoms. Followed with [facility] in 02/2017 and repeat CUS showed right CEA patent without stenosis. Scheduled to repeat CUS next month. Still not tolerating pravastatin and now stopped. Had blood draw with PCP last month and was told LDL and A1C getting better. Had recent left thumb nail infection, now on bactrim which causing him dizzy with sitting up or standing up. Will complete the Abx course tomorrow. BP today 133/76.   REVIEW OF SYSTEMS: Full 14 system review of systems performed and notable only for those listed below and in HPI above, all others are negative:  Constitutional:   Cardiovascular:  Ear/Nose/Throat:   Skin: moles Eyes:  Light sensitivity, loss of vision Respiratory:  SOB Gastroitestinal:   Genitourinary: Difficulty urination Hematology/Lymphatic:   Endocrine:  Musculoskeletal:  Walking difficulty Allergy/Immunology:   Neurological:  Memory loss, dizziness, headache Psychiatric: Agitation, confusion Sleep:   The following represents the patient's updated allergies and side effects list: Allergies  Allergen Reactions  . Crestor [employer][Rosuvastatin  Calcium]     Joint pain and weakness   . Lipitor [Atorvastatin]     Joint pain and weakness    The neurologically relevant items on the patient's problem list were reviewed on today's visit.  Neurologic Examination  A problem focused neurological exam (12 or more  points of the single system neurologic examination, vital signs counts as 1 point, cranial nerves count for 8 points) was performed.  Blood pressure 133/76, pulse 88, weight 268 lb 3.2 oz (121.7 kg).  General - obese, well developed, in no apparent distress.  Ophthalmologic - Fundi not visualized due to noncooperation.  Cardiovascular - Regular rate and rhythm with no murmur.  Mental Status -  Level of arousal and orientation to time, place, and person were intact. Language including expression, naming, repetition, comprehension was assessed and found intact. Fund of Knowledge was assessed and was intact.  Cranial Nerves II - XII - II - Visual field exam showed b/l lower quadrantanopia. Left upper quadrant simultagnosia. III, IV, VI - Extraocular movements intact. V - Facial sensation intact bilaterally. VII - left facial droop which is old from previous stroke. VIII - Hearing & vestibular intact bilaterally. X - Palate elevates symmetrically. XI - Chin turning & shoulder shrug intact bilaterally. XII - Tongue protrusion intact.  Motor Strength - The patient's strength was normal in RUE and RLE, but LUE 3/5 proximal and distal, and 4/5 proximal LLE which is old from previous stroke.  Bulk was normal and fasciculations were absent.   Motor Tone - Muscle tone was assessed at the neck and appendages and was normal.  Reflexes - The patient's reflexes were 1+ in all extremities and he had no pathological reflexes.  Sensory - Light touch, temperature/pinprick were assessed and were normal.    Coordination - The patient had normal movements in the right hand with no ataxia or dysmetria.  Tremor was absent.  Gait and Station - walks with cane, hemiplegic gait on the left   Data reviewed: I personally reviewed the images and agree with the radiology interpretations.  CT of the brain  03/17/2012 No acute intracranial abnormality seen.  03/17/2012 Paranasal sinus mucosal thickening and  fluid in the left mastoid air cells. Otherwise no acute intracranial pathology.  11/10/2013No significant change in the appearance of large right middle cerebral artery distribution infarct.   Ct Angio Head 03/17/2012 1. Decreased enhancement of the right MCA M2 branches of the anterior sylvian division. No major branch occlusion, no intracranial hemodynamically significant stenosis identified. Otherwise minimal to mild intracranial atherosclerosis. 2. Subtle cortical hypodensity in the anterior right superior frontal gyrus suspicious for ischemia in this setting. No mass effect or hemorrhage. 3. Age indeterminate but favor chronic lacunar infarct in the right thalamus.   Ct Angio Neck 03/17/2012 1. Ulcerated plaque at the right ICA origin resulting in 60% right ICA stenosis. 2. Findings suspicious for 10 mm of focal thrombus in the proximal aortic arch (see axial image 6). 3. Mild left carotid atherosclerosis. Dominant right vertebral artery. 4. See intracranial findings below. 5. Small 9 mm nodule in the right parotid gland is indeterminate.   2D Echocardiogram Left ventricle: The cavity size was normal. Wall thicknesswas increased in a pattern of moderate LVH. Systolicfunction was vigorous. The estimated ejection fraction wasin the range of 65% to 70%. Regional wall motioabnormalities cannot be excluded. Left ventriculardiastolic function parameters were normal.- Left atrium: The atrium was mildly dilated. Impressions:- No cardiac source of emboli was indentified   Carotid Doppler  2013: Right: 60-79% internal carotid artery stenosis. Left: No evidence of hemodynamically significant internal carotid artery stenosis. Bilateral: Vertebral artery flow is antegrade.   Ct Head Wo Contrast 03/13/2016 1. No acute hemorrhage or focal mass lesion visualized.  2. Evidence of old large right-sided hemispheric infarct.  3. Focal hypodense area within the left parasagittal occipital lobe, new since 2013,  likely old given absence of significant surrounding mass effect, however MRI could be obtained to confirm that this does not represent subacute ischemic change.  4. Old lacunar infarcts within the thalamus. Old right cerebellar infarct.   MR MRA Head / Brain Wo Contrast (motion degraded) 03/14/2016 1. Incomplete MRI. Acute left occipital lobe PCA infarct. 2. Large chronic right cerebral hemispheric infarct. 3. Motion degraded head MRA. P1 and proximal left P2 segments are grossly patent. Suspected right P2 occlusion or high-grade stenosis. 4. Suspected interval distal right vertebral artery occlusion.  LE venous doppler - Bilateral: No evidence of DVT, superficial thrombosis, or Baker's Cyst.  CUS - Bilateral: 1-39% ICA stenosis. Vertebral artery flow is antegrade.  TTE - - Left ventricle: Poorly visualized. The cavity size was normal. Wall thickness was increased in a pattern of moderate LVH. Systolic function was normal. The estimated ejection fraction was in the range of 50% to 55%. Doppler parameters are consistent with abnormal left ventricular relaxation (grade 1 diastolic dysfunction).  Ct Angio Head and neck W Or Wo Contrast 03/16/2016 IMPRESSION: 1. Re- demonstration of findings of acute left PCA infarct. Chronic extensive right hemispheric encephalomalacia. 2. No intracranial arterial occlusion. 3. Severe stenosis of the proximal right intracranial carotid artery, measuring 80 and 90%, worsened from the prior study. This is secondary to large volume non calcified, eccentric atherosclerotic plaque. 4. Large amount of plaque vs chronic thrombus within the proximal left subclavian artery, slightly worsened compared to 03/17/2012. 5. Linear filling defect within the aortic arch is decreased compared to 03/17/2012.   CTA neck 06/23/16 Thrombus in the proximal left subclavian artery has improved. There remains plaque at the origin of left subclavian artery with mild  stenosis Irregular plaque in the right carotid bulb is unchanged. This is narrowing the lumen by approximately 80% diameter stenosis Mild atherosclerotic disease left carotid bifurcation without significant carotid stenosis Both vertebral arteries are patent without significant stenosis. 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 Short segment linear filling defect proximal aortic arch is unchanged, possible dissection  CUS 02/2017 Right ICA: CEA site with no evidence of restenosis or hyperplasia.  Left ICA: 1-39% stenosis.  Bilateral vertebral artery flow is antegrade.  Bilateral subclavian artery waveforms are normal.    Component     Latest Ref Rng & Units 03/14/2016  Cholesterol     0 - 200 mg/dL 161  Triglycerides     <150 mg/dL 096 (H)  HDL Cholesterol     >40 mg/dL 25 (L)  Total CHOL/HDL Ratio     RATIO 7.6  VLDL     0 - 40 mg/dL 51 (H)  LDL (calc)     0 - 99 mg/dL 045 (H)  Hemoglobin W0J     4.8 - 5.6 % 6.2 (H)  Mean Plasma Glucose     mg/dL 811   Component     Latest Ref Rng & Units 09/01/2016  Hemoglobin A1C     4.8 - 5.6 % 8.6 (H)  Mean Plasma Glucose     mg/dL 914  Assessment: As you may recall, he is a 67 y.o. Caucasian male with PMH of right MCA stroke in 2013, CAD/MI s/p cardiac stent, DM, atrophy of the right kidney, HLD, OSA, HTN, and seizures was admitted on 03/13/16 for acute left PCA infarct, and remote large right MCA infarct. MRA suspected right P2 occlusion or high-grade stenosis and suspected interval distal right vertebral artery occlusion. 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 Also right ICA high grade 80-90% stenosis, worsening than 2013 which was 60%. EF 50-55%, LDL 113 and A1C 6.3. His current PCA infarct was considered from subclavian A thrombus proximal to [facility] take off.  His old MCA infarct could be due to aortic arch near innominate artery or due to right ICA stenosis. He was put on coumadin for anticoagulation as well as lipitor. He was advised no driving. He had hematuria 03/26/16 due to high INR 4.1 with UTI. Coumadin was on hold briefly and then resumed. CTA neck repeat in 06/2016 showed improved left subclavian artery thrombus, unchanged aortic arch carotid dissection and right ICA high-grade stenosis 80%. Therefore, coumadin was discontinued and changed to ASA and plavix. He was referred to Dr. Myra Gianotti for right CEA which was done in 09/04/2016. Not able to tolerate crestor or lipitor or even low dose pravastatin, also not able to afford copy for PCSK9 inhibitors. No driving at this time due to visual impairment. Followed with [facility] in 02/2017 and CUS unremarkable. Complains of dizziness with bactrim which he took for recent left thumb infection, last dose tomorrow.  Plan:  - continue ASA and plavix for stroke prevention - check BP and glucose at home and record  - Follow up with your primary care physician for stroke risk factor modification. Recommend maintain blood pressure goal <140/80, diabetes with hemoglobin A1c goal below 7.0% and lipids with LDL cholesterol goal below 70 mg/dL.  - healthy diet and home exercise - continue follow up with Dr. Myra Gianotti  - sitting up and standing up slow to avoid fall and dizziness.  - follow up as needed.  I spent more than 25 minutes of face to face time with the patient. Greater than 50% of time was spent in counseling and coordination of care. We discussed no driving, continue DAPT, follow up with [facility].    No orders of the defined types were placed in this encounter.   No orders of the defined types were placed in this encounter.   There are no Patient Instructions on file for this visit.  Marvel Plan, MD PhD [facility] [address], [address] [place], [place] [address] [phone]

## 2017-07-26 NOTE — Patient Instructions (Signed)
-   continue ASA and plavix for stroke prevention - check BP and glucose at home and record  - Follow up with your primary care physician for stroke risk factor modification. Recommend maintain blood pressure goal <140/80, diabetes with hemoglobin A1c goal below 7.0% and lipids with LDL cholesterol goal below 70 mg/dL.  - healthy diet and home exercise - continue follow up with Dr. Myra GianottiBrabham  - sitting up and standing up slow to avoid fall and dizziness.  - follow up as needed.

## 2017-07-29 DIAGNOSIS — L608 Other nail disorders: Secondary | ICD-10-CM | POA: Diagnosis not present

## 2017-07-29 DIAGNOSIS — M25642 Stiffness of left hand, not elsewhere classified: Secondary | ICD-10-CM | POA: Diagnosis not present

## 2017-07-29 DIAGNOSIS — M6281 Muscle weakness (generalized): Secondary | ICD-10-CM | POA: Diagnosis not present

## 2017-07-29 DIAGNOSIS — M629 Disorder of muscle, unspecified: Secondary | ICD-10-CM | POA: Diagnosis not present

## 2017-08-17 DIAGNOSIS — E119 Type 2 diabetes mellitus without complications: Secondary | ICD-10-CM | POA: Diagnosis not present

## 2017-08-23 ENCOUNTER — Encounter: Payer: Self-pay | Admitting: Family

## 2017-08-23 ENCOUNTER — Ambulatory Visit (HOSPITAL_COMMUNITY)
Admission: RE | Admit: 2017-08-23 | Discharge: 2017-08-23 | Disposition: A | Discharge status: Home / Self Care | Payer: Medicare Other | Source: Ambulatory Visit | Attending: Family | Admitting: Family

## 2017-08-23 ENCOUNTER — Ambulatory Visit: Payer: Medicare Other | Admitting: Family

## 2017-08-23 VITALS — BP 135/78 | HR 100 | Temp 97.1°F | Resp 19 | Ht 71.0 in | Wt 273.6 lb

## 2017-08-23 DIAGNOSIS — I6521 Occlusion and stenosis of right carotid artery: Secondary | ICD-10-CM

## 2017-08-23 DIAGNOSIS — Z87891 Personal history of nicotine dependence: Secondary | ICD-10-CM | POA: Diagnosis not present

## 2017-08-23 DIAGNOSIS — Z9889 Other specified postprocedural states: Secondary | ICD-10-CM | POA: Diagnosis not present

## 2017-08-23 DIAGNOSIS — Z8673 Personal history of transient ischemic attack (TIA), and cerebral infarction without residual deficits: Secondary | ICD-10-CM | POA: Diagnosis not present

## 2017-08-23 NOTE — Patient Instructions (Signed)

## 2017-08-23 NOTE — Progress Notes (Signed)
Chief Complaint: Follow up Extracranial Carotid Artery Stenosis   History of Present Illness  Shaun Pugh is a 67 y.o. male who is s/p right CEA on 09/04/2016 by Dr. Myra GianottiBrabham.  Intraoperative findings included an ulcerated plaque at the bifurcation. He had poor backbleeding from the internal carotid artery. His procedure was done for symptomatic stenosis.   He has a history of a right brain stroke which left him with left-sided weakness back in 2013. This was secondary to aortic and innominate artery thrombus.  Wife states pt had another stroke in November 2017 that caused visual problems in both eyes: limited bilateral peripheral vision and loss of vision in the lower half of both visual fields.   He and wife deny any known subsequent stroke or TIA sx's.  Previously, he had been on Coumadin but was changed to aspirin and Plavix.  Dr. Gerald LeitzBrabhahm last evaluated pt on 09-21-16. At that time the patient was at his baseline neurologic exam. He was complaining of some neck discomfort which was typical in the postoperative period. Dr. Myra GianottiBrabham advised follow-up in 6 months with a repeat carotid duplex.  He is not able to tolerate Crestor, Lipitor, or low dose pravastatin, not able to afford co-pay for PCSK9 inhibitors. He is followed by Dr. Roda ShuttersXu, neurologist.    Pt Diabetic: yes, last A1C result on file was 8.6 on 09-01-16; wife states his DM has improved a little since then to 7.?, but still not at goal Pt smoker: former smoker, quit in 2013, smoked x 45 years   Pt meds include: Statin : no, caused myalgias in legs, even with low doses less than daily ASA: yes Other anticoagulants/antiplatelets: Plavix   Past Medical History:  Diagnosis Date  . Acute pancreatitis 09/14/2011  . Atrophy of right kidney 09/15/2011  . CAD (coronary artery disease)    DES to mid LAD and distal RCA 2005 - Dr. Amil AmenEdmunds  . Carotid artery disease (HCC)   . Chronic back pain   . Essential hypertension   .  History of stroke 03/18/2012   Aortic atheroma implicated, was on Coumadin for a period of time and more recently aspirin and Plavix  . Hyperlipidemia   . Kidney calculi 09/15/2011  . Myocardial infarction (HCC)   . Seizures (HCC)    Following stroke  . Sleep apnea    Reportedly intolerant of CPAP  . Type 2 diabetes mellitus (HCC)     Social History Social History   Tobacco Use  . Smoking status: Former Smoker    Packs/day: 1.00    Years: 45.00    Pack years: 45.00    Types: Cigarettes    Last attempt to quit: 03/18/2012    Years since quitting: 5.4  . Smokeless tobacco: Never Used  Substance Use Topics  . Alcohol use: No  . Drug use: No    Family History Family History  Problem Relation Age of Onset  . Congestive Heart Failure Mother 5688  . Diabetes Father 10964  . Heart attack Father   . Anesthesia problems Neg Hx   . Hypotension Neg Hx   . Malignant hyperthermia Neg Hx   . Pseudochol deficiency Neg Hx     Surgical History Past Surgical History:  Procedure Laterality Date  . BACK SURGERY     LUMBAR SURGERIES x 4 - INCLUDING FUSIONS  . CARPAL TUNNEL REPAIR Left   . CHOLECYSTECTOMY  09/25/2011   Procedure: LAPAROSCOPIC CHOLECYSTECTOMY;  Surgeon: Fabio BeringBrent C Ziegler, MD;  Location: [facility];  Service: General;  Laterality: N/A;  . CYSTOSCOPY WITH RETROGRADE PYELOGRAM, URETEROSCOPY AND STENT PLACEMENT Right 05/26/2013   Procedure: CYSTOSCOPY, RIGHT RETROGRADE PYELOGRAM, RIGHT URETEROSCOPY AND RIGHT STENT PLACEMENT, stone extraction, biopsy;  Surgeon: Crist Fat, MD;  Location: [facility];  Service: Urology;  Laterality: Right;  . ENDARTERECTOMY Right 09/04/2016   Procedure: ENDARTERECTOMY CAROTID;  Surgeon: Nada Libman, MD;  Location: [facility] OR;  Service: Vascular;  Laterality: Right;  . HERNIA REPAIR     INGUINAL HERNIA REPARI MAYBE 30 YRS AGO  . MULTIPLE TOOTH EXTRACTIONS      Allergies  Allergen Reactions  . Crestor [Rosuvastatin Calcium]     Joint pain and  weakness   . Lipitor [Atorvastatin]     Joint pain and weakness    Current Outpatient Medications  Medication Sig Dispense Refill  . aspirin EC 325 MG tablet Take 1 tablet (325 mg total) by mouth daily. 90 tablet 0  . calcium-vitamin D (OSCAL WITH D) 500-200 MG-UNIT tablet Take 1 tablet by mouth daily with breakfast.    . clobetasol cream (TEMOVATE) 0.05 % Apply 1 application topically as needed.     . clopidogrel (PLAVIX) 75 MG tablet TAKE 1 TABLET BY MOUTH ONCE DAILY 90 tablet 3  . escitalopram (LEXAPRO) 10 MG tablet Take 10 mg by mouth daily.   1  . fluticasone (CUTIVATE) 0.05 % cream Apply 1 application topically as needed.     Marland Kitchen glipiZIDE (GLUCOTROL XL) 10 MG 24 hr tablet Take 10 mg by mouth daily with breakfast.    . insulin glargine (LANTUS) 100 UNIT/ML injection Inject 60 Units into the skin at bedtime.    Marland Kitchen lisinopril-hydrochlorothiazide (PRINZIDE,ZESTORETIC) 10-12.5 MG tablet Take 0.5 tablets by mouth daily.   0  . metFORMIN (GLUCOPHAGE) 500 MG tablet Take 500 mg by mouth 2 (two) times daily with a meal.    . pantoprazole (PROTONIX) 40 MG tablet Take 1 tablet by mouth daily.  2  . vitamin B-12 (CYANOCOBALAMIN) 1000 MCG tablet Take 1,000 mcg by mouth daily.     No current facility-administered medications for this visit.     Review of Systems : See HPI for pertinent positives and negatives.  Physical Examination  Vitals:   08/23/17 1341  BP: 135/78  Pulse: 100  Resp: 19  Temp: (!) 97.1 F (36.2 C)  TempSrc: Oral  SpO2: 96%  Weight: 273 lb 9.6 oz (124.1 kg)  Height: 5\' 11"  (1.803 m)   Body mass index is 38.16 kg/m.  General:WDWN obese male in NAD GAIT: mildly spastic, slow, using cane Eyes: PERRLA, Right upper lid droop.  Pulmonary:  Respirations are non-labored, good air movement, CTAB, no rales, rhonchi, or wheezing. Cardiac: regular rhythm, no detected murmur.  VASCULAR EXAM Carotid Bruits Right Left   Negative Negative     Abdominal aortic pulse is  not palpable. Radial pulses are 1+ palpable and equal.  LE Pulses Right Left       POPLITEAL  not palpable   not palpable       POSTERIOR TIBIAL  1+ palpable   1+ palpable        DORSALIS PEDIS      ANTERIOR TIBIAL not palpable  not palpable     Gastrointestinal: soft, nontender, BS WNL, no r/g, no palpable masses. Musculoskeletal: + muscle atrophy/wasting in left upper extremity. M/S 5/5 in right upper and lower extremities, 3/5 in left upper and lower extremities, extremities without ischemic changes. Neurologic:  A&O X 3; appropriate affect, sensation is diminished in left upper and lower extremties; speech is normal, CN 2-12 intact, motor exam as listed above. Skin: No rashes, no ulcers, no cellulitis.   Psychiatric: Normal thought content, mood appropriate to clinical situation.    Assessment: Shaun Pugh is a 67 y.o. male who is s/p right CEA on 09/04/2016. He had a stroke in 2013 with residual left hemiparesis, and November of 2017 with residual partial loss of visual fields, no stroke or TIA since then.   His atherosclerotic risk factors include almost in control DM, 45 year smoking hx (quit in 2013), CAD with hx of MI and CABG, statin intolerance, and obesity.   DATA Carotid Duplex (08/23/17): Right ICA: CEA site with no evidence of restenosis or hyperplasia.  Left ICA: 1-39% stenosis.  Bilateral vertebral artery flow is antegrade.  Bilateral subclavian artery waveforms are normal.  Technically difficult due to body habitus and breathing patterns.  No change since the exam on 02-22-17.    Plan: Follow-up in 1 year with Carotid Duplex scan   I discussed in depth with the patient the nature of atherosclerosis, and emphasized the importance of maximal medical management including strict control of blood pressure, blood  glucose, and lipid levels, obtaining regular exercise, and continued cessation of smoking.  The patient is aware that without maximal medical management the underlying atherosclerotic disease process will progress, limiting the benefit of any interventions. The patient was given information about stroke prevention and what symptoms should prompt the patient to seek immediate medical care. Thank you for allowing [place] to participate in this patient's care.  Charisse March, RN, MSN, FNP-C [facility] Office: [phone]  Clinic Physician: Myra Gianotti  08/23/17 1:59 PM

## 2017-10-14 DIAGNOSIS — E1122 Type 2 diabetes mellitus with diabetic chronic kidney disease: Secondary | ICD-10-CM | POA: Diagnosis not present

## 2017-10-18 DIAGNOSIS — N183 Chronic kidney disease, stage 3 (moderate): Secondary | ICD-10-CM | POA: Diagnosis not present

## 2017-10-18 DIAGNOSIS — I129 Hypertensive chronic kidney disease with stage 1 through stage 4 chronic kidney disease, or unspecified chronic kidney disease: Secondary | ICD-10-CM | POA: Diagnosis not present

## 2017-10-18 DIAGNOSIS — E782 Mixed hyperlipidemia: Secondary | ICD-10-CM | POA: Diagnosis not present

## 2017-10-18 DIAGNOSIS — E1122 Type 2 diabetes mellitus with diabetic chronic kidney disease: Secondary | ICD-10-CM | POA: Diagnosis not present

## 2017-10-18 DIAGNOSIS — R739 Hyperglycemia, unspecified: Secondary | ICD-10-CM | POA: Diagnosis not present

## 2017-11-22 DIAGNOSIS — Z6824 Body mass index (BMI) 24.0-24.9, adult: Secondary | ICD-10-CM | POA: Diagnosis not present

## 2017-11-22 DIAGNOSIS — M79646 Pain in unspecified finger(s): Secondary | ICD-10-CM | POA: Diagnosis not present

## 2017-11-22 DIAGNOSIS — Z0001 Encounter for general adult medical examination with abnormal findings: Secondary | ICD-10-CM | POA: Diagnosis not present

## 2017-11-22 DIAGNOSIS — L600 Ingrowing nail: Secondary | ICD-10-CM | POA: Diagnosis not present

## 2017-11-25 DIAGNOSIS — L03012 Cellulitis of left finger: Secondary | ICD-10-CM | POA: Diagnosis not present

## 2017-12-07 DIAGNOSIS — Z0000 Encounter for general adult medical examination without abnormal findings: Secondary | ICD-10-CM | POA: Diagnosis not present

## 2017-12-13 DIAGNOSIS — E119 Type 2 diabetes mellitus without complications: Secondary | ICD-10-CM | POA: Diagnosis not present

## 2017-12-13 DIAGNOSIS — B351 Tinea unguium: Secondary | ICD-10-CM | POA: Diagnosis not present

## 2017-12-13 DIAGNOSIS — E1140 Type 2 diabetes mellitus with diabetic neuropathy, unspecified: Secondary | ICD-10-CM | POA: Diagnosis not present

## 2017-12-20 IMAGING — CT CT HEAD W/O CM
3 series · 15 of 47 positions shown, 18 images · non-contrast
Comparison: 03/20/2012, 03/17/2012

CLINICAL DATA: Headache

EXAM:
CT HEAD WITHOUT CONTRAST
TECHNIQUE: Contiguous axial images were obtained from the base of the skull
through the vertex without intravenous contrast.

[Series 2: head wo · axial · 0.54mm/px · z∈[+95,+235]mm · 9 of 34 slices shown, 12 images]
[im 3/34  brain]
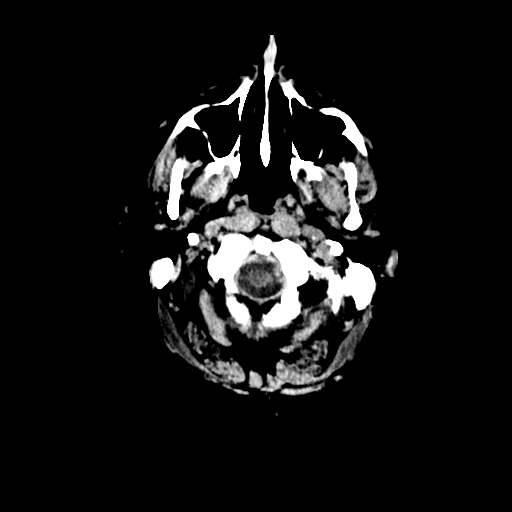
[im 3/34  bone]
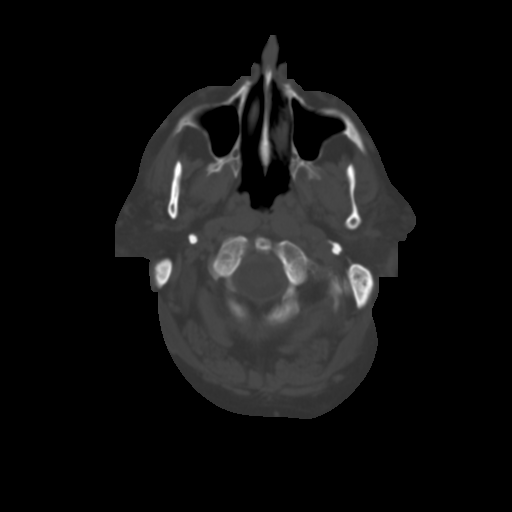
[im 6/34  brain]
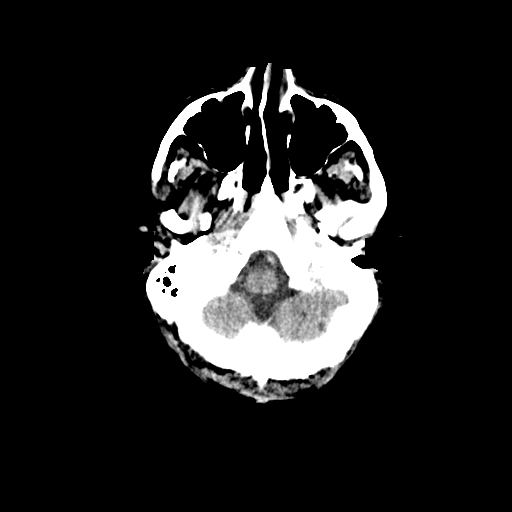
[im 10/34  brain]
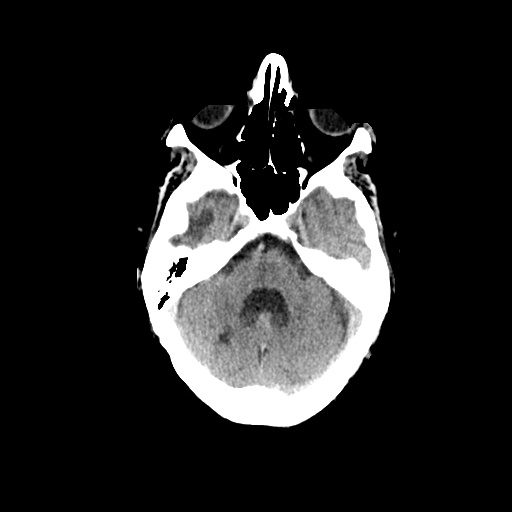
[im 13/34  brain]
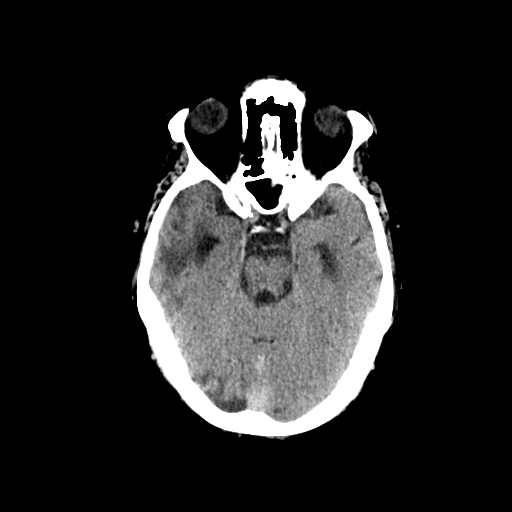
[im 18/34  brain]
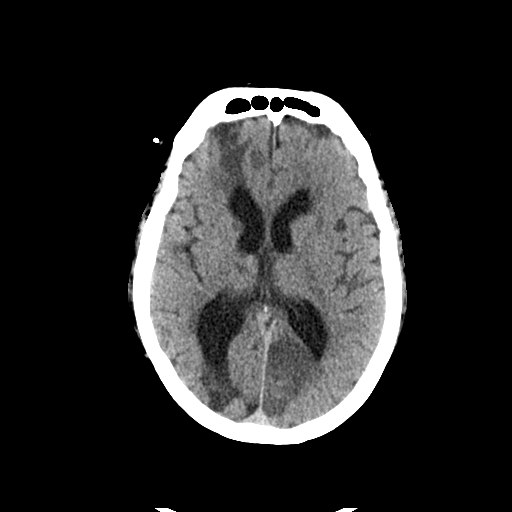
[im 18/34  bone]
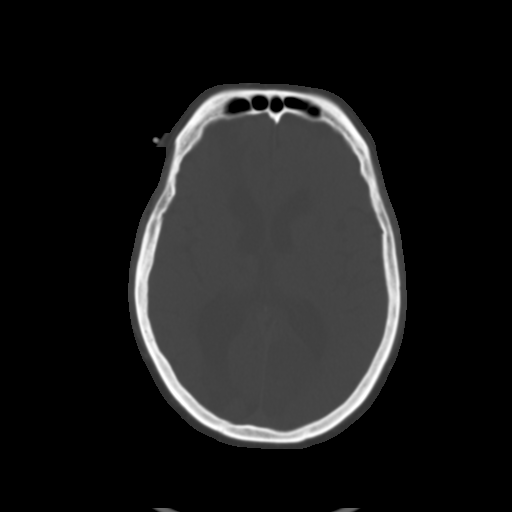
[im 21/34  brain]
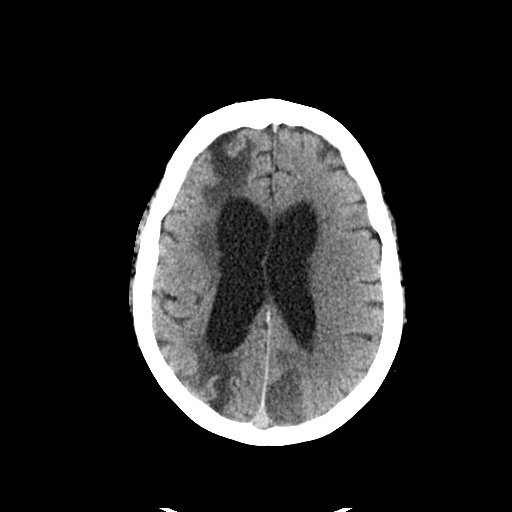
[im 24/34  brain]
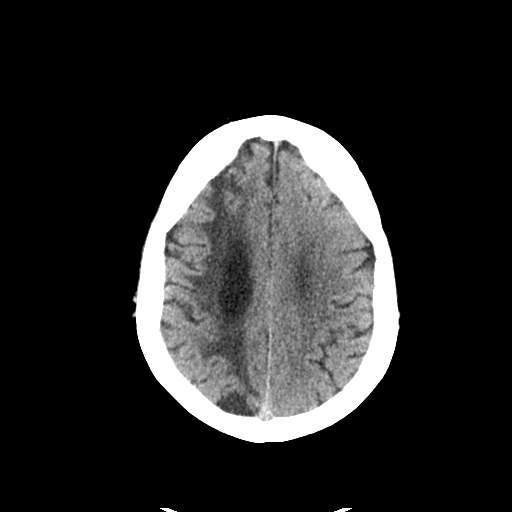
[im 28/34  brain]
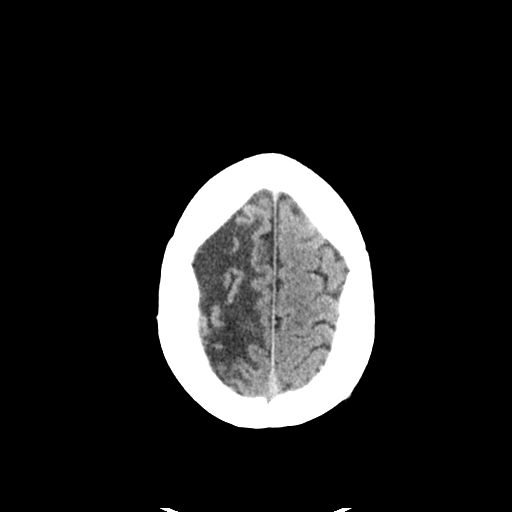
[im 31/34  brain]
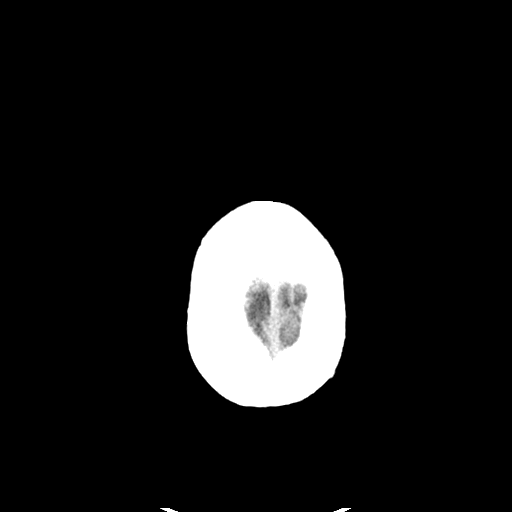
[im 31/34  bone]
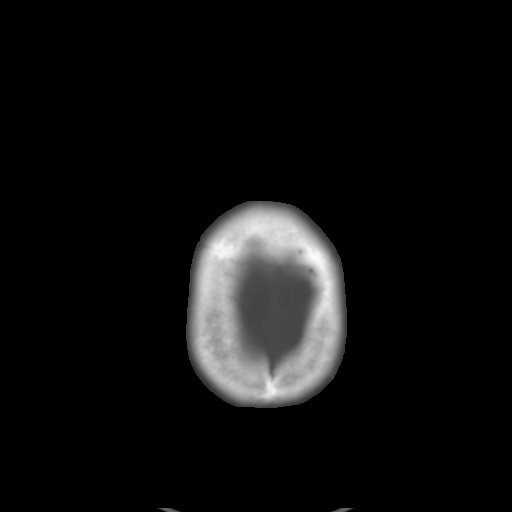

[Series 4: coronal soft tissue · coronal · 0.43mm/px · 3 of 78 slices shown]
[im 26/78  brain]
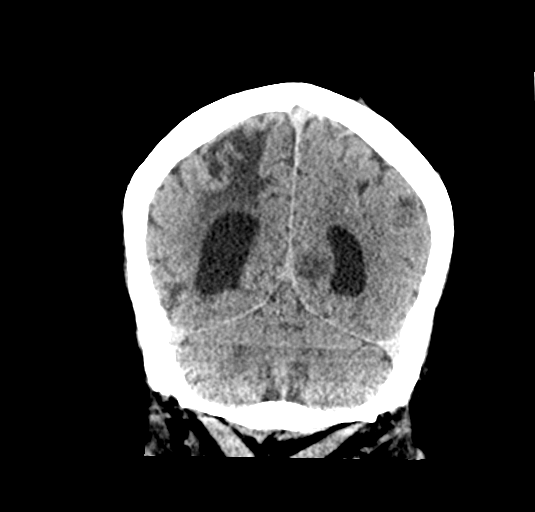
[im 35/78  brain]
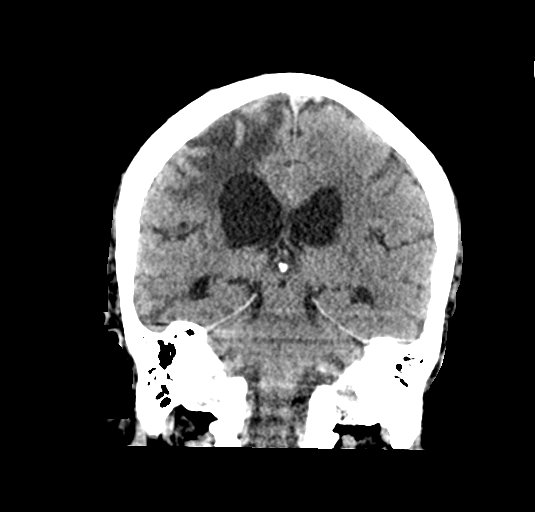
[im 43/78  brain]
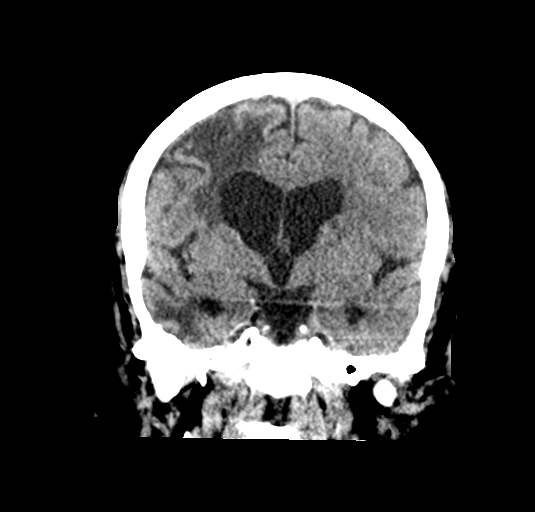

[Series 5: sagittal soft tissue · sagittal · 0.38mm/px · 3 of 67 slices shown]
[im 23/67  brain]
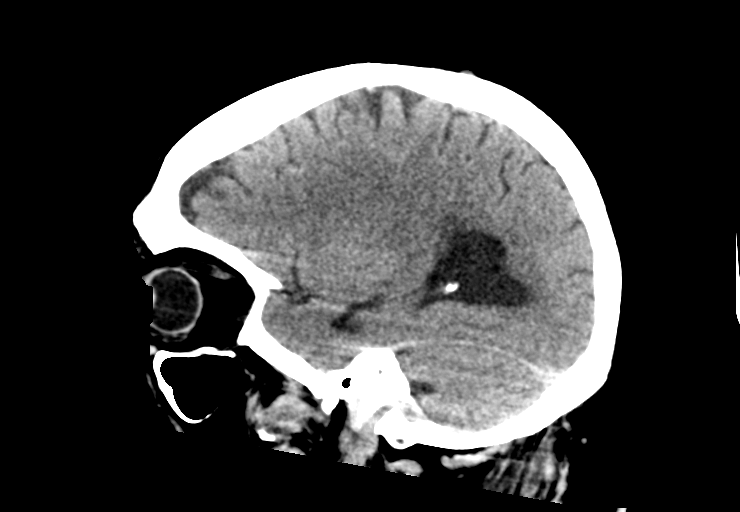
[im 34/67  brain]
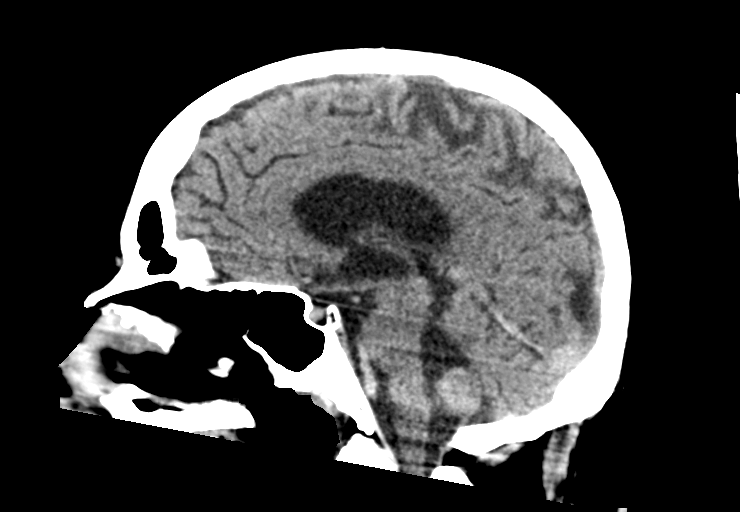
[im 45/67  brain]
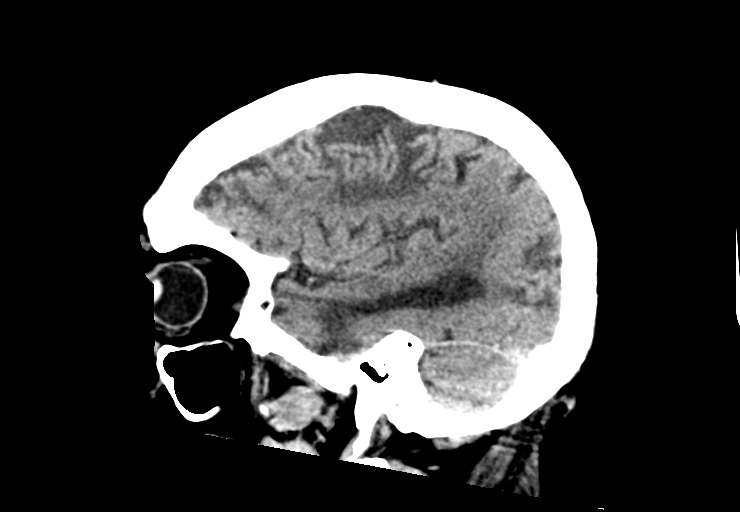

[15 of 47 positions shown; findings below may reference images not displayed]

FINDINGS: Brain: There is no acute hemorrhage identified. There is no focal
mass, mass effect or midline shift. Evidence of large old
right-sided infarct involving the occipital, temporal, frontal and
parietal lobes. Mild associated distortion of right lateral
ventricle.

There is hypodensity within the left paracentral occipital lobe, new
compared with 4591 study. No significant surrounding mass effect.
Periventricular white matter hypodensities consistent with small
vessel disease. Old appearing lacunar infarcts within the bilateral
the thalamus. Old small infarct in the right cerebellar hemisphere.

Vascular: No hyperdense vessels. Mild calcifications within the
carotid arteries at the skullbase.

Skull: No fracture. Minimal opacifications of the inferior mastoids.

Sinuses/Orbits: Mild mucosal thickening in paranasal sinuses. No
acute orbital abnormality.

Other: None
IMPRESSION: 1. No acute hemorrhage or focal mass lesion visualized.
2. Evidence of old large right-sided hemispheric infarct.
3. Focal hypodense area within the left parasagittal occipital lobe,
new since 4591, likely old given absence of significant surrounding
mass effect, however MRI could be obtained to confirm that this does
not represent subacute ischemic change.
4. Old lacunar infarcts within the thalamus. Old right cerebellar
infarct.

## 2018-01-05 DIAGNOSIS — E1140 Type 2 diabetes mellitus with diabetic neuropathy, unspecified: Secondary | ICD-10-CM | POA: Diagnosis not present

## 2018-01-12 ENCOUNTER — Telehealth: Payer: Self-pay | Admitting: Neurology

## 2018-01-12 NOTE — Telephone Encounter (Addendum)
Rn call Halford Decamp at [phone] at [facility] orthopedics. Rn stated a fax was sent to [facility] stroke office. Rn stated to Jacki Cones Dr. Roda Shutters no longer works or sees patients at [facility]. He only works at the hospital. IF they need any future  clearance forms or any questions they can call our office. RN reminded Jacki Cones to let her office know that. Rn gave her [facility] fax number of [phone]. Rn stated Dr. Roda Shutters did a clearance letter in the phone note, and recommend I fax it. RN fax clearance letter to [phone],and confirmed. Jacki Cones verbalized understanding, and knows to send all clearance forms that has Dr. Roda Shutters name on it.

## 2018-01-12 NOTE — Telephone Encounter (Signed)
I received a fax in [facility] office that pt was scheduled for left thumb nail bed debri and repair on 01/20/18. I was asked to give instructions for plavix perioperative management. The fax was from Dr. Glenna Durand office at [facility].   Currently, pt is on ASA and plavix. He may stop plavix 5 days prior to his incoming procedure. However, pt has to be aware that it carries a small but acceptable peri-operative risk for TIA/stroke while off plavix. We recommend to continue ASA for the days pt is off plavix if appropriate. Plavix can be resumed post-procedure if hemodynamically stable.   Hi, Katrina, please fax the above comments to Halford Decamp at [phone]. Her phone number is [phone]. Also, if you can please let the pt know too. Thanks much.   Marvel Plan, MD PhD Stroke Neurology 01/12/2018 2:20 PM

## 2018-01-13 ENCOUNTER — Other Ambulatory Visit: Payer: Self-pay

## 2018-01-13 ENCOUNTER — Encounter (HOSPITAL_BASED_OUTPATIENT_CLINIC_OR_DEPARTMENT_OTHER): Payer: Self-pay

## 2018-01-13 NOTE — H&P (Signed)
Shaun Pugh is an 67 y.o. male.   CC: LEFT THUMB MASS  HPI: Shaun Pugh is a 67 y/o right hand dominant male who had a mass appear on the distal part of the left thumb approximately 6 years ago. He underwent an I&D of the left thumb in May 2018 due to a paronychia. The mass of the thumb has grown since that time. He denies decreased range of motion, numbness, or drainage.  Discussed the mass with the patient and treatment with continued observation versus surgical excision. He would like to proceed with surgical excision.  Discussed the surgical procedure, including the risks versus benefits, and the post-operative recovery.  The patient is here today for surgery.  He denies chest pain, shortness of breath, nausea, vomiting, diarrhea, fever, or chills.    Past Medical History:  Diagnosis Date  . Acute pancreatitis 09/14/2011  . Atrophy of right kidney 09/15/2011  . CAD (coronary artery disease)    DES to mid LAD and distal RCA 2005 - Dr. Amil Amen  . Carotid artery disease (HCC)   . Chronic back pain   . Essential hypertension   . History of stroke 03/18/2012   Aortic atheroma implicated, was on Coumadin for a period of time and more recently aspirin and Plavix  . Hyperlipidemia   . Kidney calculi 09/15/2011  . Myocardial infarction (HCC)   . Seizures (HCC)    Following stroke  . Sleep apnea    Reportedly intolerant of CPAP  . Type 2 diabetes mellitus (HCC)     Past Surgical History:  Procedure Laterality Date  . BACK SURGERY     LUMBAR SURGERIES x 4 - INCLUDING FUSIONS  . CARPAL TUNNEL REPAIR Left   . CHOLECYSTECTOMY  09/25/2011   Procedure: LAPAROSCOPIC CHOLECYSTECTOMY;  Surgeon: Fabio Bering, MD;  Location: [facility];  Service: General;  Laterality: N/A;  . CYSTOSCOPY WITH RETROGRADE PYELOGRAM, URETEROSCOPY AND STENT PLACEMENT Right 05/26/2013   Procedure: CYSTOSCOPY, RIGHT RETROGRADE PYELOGRAM, RIGHT URETEROSCOPY AND RIGHT STENT PLACEMENT, stone extraction, biopsy;  Surgeon:  Crist Fat, MD;  Location: [facility];  Service: Urology;  Laterality: Right;  . ENDARTERECTOMY Right 09/04/2016   Procedure: ENDARTERECTOMY CAROTID;  Surgeon: Nada Libman, MD;  Location: [facility] OR;  Service: Vascular;  Laterality: Right;  . HERNIA REPAIR     INGUINAL HERNIA REPARI MAYBE 30 YRS AGO  . MULTIPLE TOOTH EXTRACTIONS      Family History  Problem Relation Age of Onset  . Congestive Heart Failure Mother 3  . Diabetes Father 2  . Heart attack Father   . Anesthesia problems Neg Hx   . Hypotension Neg Hx   . Malignant hyperthermia Neg Hx   . Pseudochol deficiency Neg Hx    Social History:  reports that he quit smoking about 5 years ago. His smoking use included cigarettes. He has a 45.00 pack-year smoking history. He has never used smokeless tobacco. He reports that he does not drink alcohol or use drugs.  Allergies:  Allergies  Allergen Reactions  . Crestor [Rosuvastatin Calcium]     Joint pain and weakness   . Lipitor [Atorvastatin]     Joint pain and weakness    No medications prior to admission.    No results found for this or any previous visit (from the past 48 hour(s)). No results found.  ROS NO RECENT ILLNESSES OR HOSPITALIZATIONS  There were no vitals taken for this visit. Physical Exam  General Appearance:  Alert, cooperative, no distress, appears  stated age  Head:  Normocephalic, without obvious abnormality, atraumatic  Eyes:  Pupils equal, conjunctiva/corneas clear,         Throat: Lips, mucosa, and tongue normal; teeth and gums normal  Neck: No visible masses     Lungs:   respirations unlabored  Chest Wall:  No tenderness or deformity  Heart:  Regular rate and rhythm,  Abdomen:   Soft, non-tender,         Extremities: LUE: Mildly tender mass at the radial border of the nailbed of the thumb with no erythema or drainage. Sensation intact to light touch distally. Capillary refill less than 2 seconds. Able to flex and extend the thumb at all  joints but the hand resumes a clenched position due to a stroke.   Pulses: 2+ and symmetric  Skin: Skin color, texture, turgor normal, no rashes or lesions     Neurologic: Normal    Assessment LEFT THUMB NAIL BED MASS AND DEFORMITY   Plan LEFT THUMB NAIL BED DEBRIDEMENT AND REPAIR AS INDICATED  WE ARE PLANNING SURGERY FOR YOUR UPPER EXTREMITY. THE RISKS AND BENEFITS OF SURGERY INCLUDE BUT NOT LIMITED TO BLEEDING INFECTION, DAMAGE TO NEARBY NERVES ARTERIES TENDONS, FAILURE OF SURGERY TO ACCOMPLISH ITS INTENDED GOALS, PERSISTENT SYMPTOMS AND NEED FOR FURTHER SURGICAL INTERVENTION. WITH THIS IN MIND WE WILL PROCEED. I HAVE DISCUSSED WITH THE PATIENT THE PRE AND POSTOPERATIVE REGIMEN AND THE DOS AND DON'TS. PT VOICED UNDERSTANDING AND INFORMED CONSENT SIGNED.  R/B/A DISCUSSED WITH PT IN OFFICE.  PT VOICED UNDERSTANDING OF PLAN CONSENT SIGNED DAY OF SURGERY PT SEEN AND EXAMINED PRIOR TO OPERATIVE PROCEDURE/DAY OF SURGERY SITE MARKED. QUESTIONS ANSWERED WILL GO HOME FOLLOWING SURGERY  DR.Deaunna Olarte [facility] 9/12/209   Karma Greaser 01/13/2018, 8:44 AM

## 2018-01-13 NOTE — Pre-Procedure Instructions (Signed)
Reviewed medical history and noted from Dr. Roda Shutters with Dr. Okey Dupre patient is ok to proceed with surgery 01/20/2018 at [facility] with no further surgical clearance needed.

## 2018-01-13 NOTE — Progress Notes (Signed)
Spoke with:  Aram Beecham (wife)  NPO:  After Midnight, no gum, candy, or mints   Arrival time:  0930AM Labs: Istat 8, EKG AM medications: Escitalopram, Pantoprazole Pre op orders: Yes Ride home:  Aram Beecham (wife) [phone]

## 2018-01-14 DIAGNOSIS — H47292 Other optic atrophy, left eye: Secondary | ICD-10-CM | POA: Diagnosis not present

## 2018-01-20 ENCOUNTER — Other Ambulatory Visit: Payer: Self-pay

## 2018-01-20 ENCOUNTER — Ambulatory Visit (HOSPITAL_BASED_OUTPATIENT_CLINIC_OR_DEPARTMENT_OTHER): Payer: Medicare Other | Admitting: Anesthesiology

## 2018-01-20 ENCOUNTER — Ambulatory Visit (HOSPITAL_BASED_OUTPATIENT_CLINIC_OR_DEPARTMENT_OTHER)
Admission: RE | Admit: 2018-01-20 | Discharge: 2018-01-20 | Disposition: A | Discharge status: Home / Self Care | Payer: Medicare Other | Source: Ambulatory Visit | Attending: Orthopedic Surgery | Admitting: Orthopedic Surgery

## 2018-01-20 ENCOUNTER — Encounter (HOSPITAL_BASED_OUTPATIENT_CLINIC_OR_DEPARTMENT_OTHER): Payer: Self-pay | Admitting: *Deleted

## 2018-01-20 ENCOUNTER — Encounter (HOSPITAL_BASED_OUTPATIENT_CLINIC_OR_DEPARTMENT_OTHER)
Admission: RE | Disposition: A | Discharge status: Home / Self Care | Payer: Self-pay | Source: Ambulatory Visit | Attending: Orthopedic Surgery

## 2018-01-20 DIAGNOSIS — I2510 Atherosclerotic heart disease of native coronary artery without angina pectoris: Secondary | ICD-10-CM | POA: Diagnosis not present

## 2018-01-20 DIAGNOSIS — Z955 Presence of coronary angioplasty implant and graft: Secondary | ICD-10-CM | POA: Insufficient documentation

## 2018-01-20 DIAGNOSIS — L608 Other nail disorders: Secondary | ICD-10-CM | POA: Diagnosis not present

## 2018-01-20 DIAGNOSIS — L98499 Non-pressure chronic ulcer of skin of other sites with unspecified severity: Secondary | ICD-10-CM | POA: Diagnosis not present

## 2018-01-20 DIAGNOSIS — I10 Essential (primary) hypertension: Secondary | ICD-10-CM | POA: Insufficient documentation

## 2018-01-20 DIAGNOSIS — Z7982 Long term (current) use of aspirin: Secondary | ICD-10-CM | POA: Diagnosis not present

## 2018-01-20 DIAGNOSIS — Z888 Allergy status to other drugs, medicaments and biological substances status: Secondary | ICD-10-CM | POA: Insufficient documentation

## 2018-01-20 DIAGNOSIS — I252 Old myocardial infarction: Secondary | ICD-10-CM | POA: Diagnosis not present

## 2018-01-20 DIAGNOSIS — Z87891 Personal history of nicotine dependence: Secondary | ICD-10-CM | POA: Insufficient documentation

## 2018-01-20 DIAGNOSIS — E11622 Type 2 diabetes mellitus with other skin ulcer: Secondary | ICD-10-CM | POA: Insufficient documentation

## 2018-01-20 DIAGNOSIS — Z7902 Long term (current) use of antithrombotics/antiplatelets: Secondary | ICD-10-CM | POA: Diagnosis not present

## 2018-01-20 DIAGNOSIS — E1151 Type 2 diabetes mellitus with diabetic peripheral angiopathy without gangrene: Secondary | ICD-10-CM | POA: Diagnosis not present

## 2018-01-20 DIAGNOSIS — L03022 Acute lymphangitis of left finger: Secondary | ICD-10-CM | POA: Diagnosis not present

## 2018-01-20 DIAGNOSIS — L0889 Other specified local infections of the skin and subcutaneous tissue: Secondary | ICD-10-CM | POA: Insufficient documentation

## 2018-01-20 DIAGNOSIS — L905 Scar conditions and fibrosis of skin: Secondary | ICD-10-CM | POA: Diagnosis not present

## 2018-01-20 DIAGNOSIS — R739 Hyperglycemia, unspecified: Secondary | ICD-10-CM

## 2018-01-20 DIAGNOSIS — Z794 Long term (current) use of insulin: Secondary | ICD-10-CM | POA: Diagnosis not present

## 2018-01-20 DIAGNOSIS — G4730 Sleep apnea, unspecified: Secondary | ICD-10-CM | POA: Insufficient documentation

## 2018-01-20 DIAGNOSIS — Z8249 Family history of ischemic heart disease and other diseases of the circulatory system: Secondary | ICD-10-CM | POA: Diagnosis not present

## 2018-01-20 DIAGNOSIS — Z8673 Personal history of transient ischemic attack (TIA), and cerebral infarction without residual deficits: Secondary | ICD-10-CM | POA: Diagnosis not present

## 2018-01-20 DIAGNOSIS — Z79899 Other long term (current) drug therapy: Secondary | ICD-10-CM | POA: Diagnosis not present

## 2018-01-20 DIAGNOSIS — E785 Hyperlipidemia, unspecified: Secondary | ICD-10-CM | POA: Diagnosis not present

## 2018-01-20 DIAGNOSIS — D481 Neoplasm of uncertain behavior of connective and other soft tissue: Secondary | ICD-10-CM | POA: Diagnosis not present

## 2018-01-20 DIAGNOSIS — R2232 Localized swelling, mass and lump, left upper limb: Secondary | ICD-10-CM | POA: Diagnosis not present

## 2018-01-20 HISTORY — PX: MINOR NAILBED REPAIR: SHX5979

## 2018-01-20 HISTORY — DX: Unspecified osteoarthritis, unspecified site: M19.90

## 2018-01-20 HISTORY — DX: Gastro-esophageal reflux disease without esophagitis: K21.9

## 2018-01-20 HISTORY — DX: Retinal artery branch occlusion, unspecified eye: H34.239

## 2018-01-20 HISTORY — DX: Nausea with vomiting, unspecified: R11.2

## 2018-01-20 HISTORY — DX: Nontoxic single thyroid nodule: E04.1

## 2018-01-20 HISTORY — DX: Personal history of urinary calculi: Z87.442

## 2018-01-20 HISTORY — DX: Carpal tunnel syndrome, bilateral upper limbs: G56.03

## 2018-01-20 HISTORY — DX: Occlusion and stenosis of right carotid artery: I65.21

## 2018-01-20 HISTORY — DX: Pneumonia, unspecified organism: J18.9

## 2018-01-20 HISTORY — DX: Personal history of other diseases of the digestive system: Z87.19

## 2018-01-20 HISTORY — DX: Cerebral infarction due to unspecified occlusion or stenosis of unspecified posterior cerebral artery: I63.539

## 2018-01-20 HISTORY — DX: Cardiomegaly: I51.7

## 2018-01-20 HISTORY — DX: Meibomian gland dysfunction of unspecified eye, unspecified eyelid: H02.889

## 2018-01-20 HISTORY — DX: Unspecified age-related cataract: H25.9

## 2018-01-20 HISTORY — DX: Other specified postprocedural states: Z98.890

## 2018-01-20 HISTORY — DX: Heart disease, unspecified: I51.9

## 2018-01-20 HISTORY — DX: Occlusion and stenosis of unspecified carotid artery: I65.29

## 2018-01-20 HISTORY — DX: Aneurysm of iliac artery: I72.3

## 2018-01-20 HISTORY — DX: Personal history of (healed) traumatic fracture: Z87.81

## 2018-01-20 HISTORY — DX: Unspecified hydronephrosis: N13.30

## 2018-01-20 HISTORY — DX: Presence of dental prosthetic device (complete) (partial): Z97.2

## 2018-01-20 LAB — POCT I-STAT, CHEM 8
BUN: 14 mg/dL (ref 8–23)
Calcium, Ion: 1.23 mmol/L (ref 1.15–1.40)
Chloride: 104 mmol/L (ref 98–111)
Creatinine, Ser: 1.1 mg/dL (ref 0.61–1.24)
GLUCOSE: 171 mg/dL — AB (ref 70–99)
HEMATOCRIT: 42 % (ref 39.0–52.0)
Hemoglobin: 14.3 g/dL (ref 13.0–17.0)
Potassium: 4.1 mmol/L (ref 3.5–5.1)
Sodium: 139 mmol/L (ref 135–145)
TCO2: 22 mmol/L (ref 22–32)

## 2018-01-20 SURGERY — MINOR NAILBED REPAIR
Anesthesia: Monitor Anesthesia Care | Site: Thumb | Laterality: Left

## 2018-01-20 MED ORDER — FENTANYL CITRATE (PF) 100 MCG/2ML IJ SOLN
INTRAMUSCULAR | Status: AC
  Filled 2018-01-20: qty 2

## 2018-01-20 MED ORDER — ONDANSETRON HCL 4 MG/2ML IJ SOLN
INTRAMUSCULAR | Status: AC
  Filled 2018-01-20: qty 2

## 2018-01-20 MED ORDER — PROPOFOL 500 MG/50ML IV EMUL
INTRAVENOUS | Status: AC
  Filled 2018-01-20: qty 50

## 2018-01-20 MED ORDER — FENTANYL CITRATE (PF) 100 MCG/2ML IJ SOLN
INTRAMUSCULAR | Status: DC | PRN
  Administered 2018-01-20 (×2): 25 ug via INTRAVENOUS

## 2018-01-20 MED ORDER — SODIUM CHLORIDE 0.9 % IV SOLN
INTRAVENOUS | Status: DC
  Administered 2018-01-20: 11:00:00 via INTRAVENOUS
  Filled 2018-01-20: qty 1000

## 2018-01-20 MED ORDER — LIDOCAINE HCL (PF) 1 % IJ SOLN
INTRAMUSCULAR | Status: DC | PRN
  Administered 2018-01-20: 5 mL

## 2018-01-20 MED ORDER — PROMETHAZINE HCL 25 MG/ML IJ SOLN
6.2500 mg | INTRAMUSCULAR | Status: DC | PRN
  Filled 2018-01-20: qty 1

## 2018-01-20 MED ORDER — MIDAZOLAM HCL 2 MG/2ML IJ SOLN
INTRAMUSCULAR | Status: AC
  Filled 2018-01-20: qty 2

## 2018-01-20 MED ORDER — FENTANYL CITRATE (PF) 100 MCG/2ML IJ SOLN
25.0000 ug | INTRAMUSCULAR | Status: DC | PRN
  Filled 2018-01-20: qty 1

## 2018-01-20 MED ORDER — LIDOCAINE 2% (20 MG/ML) 5 ML SYRINGE
INTRAMUSCULAR | Status: AC
  Filled 2018-01-20: qty 5

## 2018-01-20 MED ORDER — CHLORHEXIDINE GLUCONATE 4 % EX LIQD
60.0000 mL | Freq: Once | CUTANEOUS | Status: DC
  Filled 2018-01-20: qty 118

## 2018-01-20 MED ORDER — CEFAZOLIN SODIUM-DEXTROSE 2-4 GM/100ML-% IV SOLN
INTRAVENOUS | Status: AC
  Filled 2018-01-20: qty 100

## 2018-01-20 MED ORDER — CEFAZOLIN SODIUM-DEXTROSE 2-4 GM/100ML-% IV SOLN
2.0000 g | INTRAVENOUS | Status: AC
  Administered 2018-01-20: 3 g via INTRAVENOUS
  Filled 2018-01-20: qty 100

## 2018-01-20 MED ORDER — LIDOCAINE 2% (20 MG/ML) 5 ML SYRINGE
INTRAMUSCULAR | Status: DC | PRN
  Administered 2018-01-20: 25 mg via INTRAVENOUS

## 2018-01-20 MED ORDER — BUPIVACAINE HCL (PF) 0.5 % IJ SOLN
INTRAMUSCULAR | Status: DC | PRN
  Administered 2018-01-20: 5 mL

## 2018-01-20 MED ORDER — PROPOFOL 500 MG/50ML IV EMUL
INTRAVENOUS | Status: DC | PRN
  Administered 2018-01-20: 100 ug/kg/min via INTRAVENOUS

## 2018-01-20 MED ORDER — CEFAZOLIN SODIUM-DEXTROSE 1-4 GM/50ML-% IV SOLN
INTRAVENOUS | Status: AC
  Filled 2018-01-20: qty 50

## 2018-01-20 SURGICAL SUPPLY — 44 items
BLADE SURG 15 STRL LF DISP TIS (BLADE) ×1 IMPLANT
BLADE SURG 15 STRL SS (BLADE) ×2
BNDG CMPR 9X4 STRL LF SNTH (GAUZE/BANDAGES/DRESSINGS) ×1
BNDG COHESIVE 1X5 TAN STRL LF (GAUZE/BANDAGES/DRESSINGS) ×2 IMPLANT
BNDG CONFORM 2 STRL LF (GAUZE/BANDAGES/DRESSINGS) ×2 IMPLANT
BNDG ESMARK 4X9 LF (GAUZE/BANDAGES/DRESSINGS) ×2 IMPLANT
CORDS BIPOLAR (ELECTRODE) IMPLANT
COVER BACK TABLE 60X90IN (DRAPES) ×2 IMPLANT
COVER MAYO STAND STRL (DRAPES) ×2 IMPLANT
DRAPE EXTREMITY T 121X128X90 (DRAPE) ×2 IMPLANT
DRAPE SURG 17X23 STRL (DRAPES) ×2 IMPLANT
DRSG EMULSION OIL 3X3 NADH (GAUZE/BANDAGES/DRESSINGS) ×2 IMPLANT
GAUZE 4X4 16PLY RFD (DISPOSABLE) IMPLANT
GAUZE SPONGE 4X4 12PLY STRL (GAUZE/BANDAGES/DRESSINGS) ×2 IMPLANT
GAUZE XEROFORM 1X8 LF (GAUZE/BANDAGES/DRESSINGS) ×2 IMPLANT
GLOVE BIO SURGEON STRL SZ 6.5 (GLOVE) ×2 IMPLANT
GLOVE BIO SURGEON STRL SZ8 (GLOVE) ×2 IMPLANT
GLOVE BIOGEL PI IND STRL 7.5 (GLOVE) ×1 IMPLANT
GLOVE BIOGEL PI IND STRL 8.5 (GLOVE) ×1 IMPLANT
GLOVE BIOGEL PI INDICATOR 7.5 (GLOVE) ×1
GLOVE BIOGEL PI INDICATOR 8.5 (GLOVE) ×1
GOWN STRL REUS W/ TWL LRG LVL3 (GOWN DISPOSABLE) ×2 IMPLANT
GOWN STRL REUS W/ TWL XL LVL3 (GOWN DISPOSABLE) ×1 IMPLANT
GOWN STRL REUS W/TWL LRG LVL3 (GOWN DISPOSABLE) ×4
GOWN STRL REUS W/TWL XL LVL3 (GOWN DISPOSABLE) ×2
NDL SAFETY ECLIPSE 18X1.5 (NEEDLE) ×1 IMPLANT
NEEDLE HYPO 18GX1.5 SHARP (NEEDLE) ×2
NEEDLE HYPO 25X1 1.5 SAFETY (NEEDLE) ×2 IMPLANT
NS IRRIG 1000ML POUR BTL (IV SOLUTION) ×2 IMPLANT
PACK BASIN DAY SURGERY FS (CUSTOM PROCEDURE TRAY) ×2 IMPLANT
PAD ALCOHOL SWAB (MISCELLANEOUS) ×8 IMPLANT
PADDING CAST ABS 3INX4YD NS (CAST SUPPLIES)
PADDING CAST ABS 4INX4YD NS (CAST SUPPLIES) ×1
PADDING CAST ABS COTTON 3X4 (CAST SUPPLIES) IMPLANT
PADDING CAST ABS COTTON 4X4 ST (CAST SUPPLIES) ×1 IMPLANT
STOCKINETTE 4X48 STRL (DRAPES) ×2 IMPLANT
SUT CHROMIC 4 0 RB 1X27 (SUTURE) ×1 IMPLANT
SUT CHROMIC 6 0 PS 4 (SUTURE) ×2 IMPLANT
SUT ETHILON 4 0 P 3 18 (SUTURE) IMPLANT
SUT PROLENE 4 0 PS 2 18 (SUTURE) IMPLANT
SYR BULB 3OZ (MISCELLANEOUS) ×2 IMPLANT
SYR CONTROL 10ML LL (SYRINGE) ×2 IMPLANT
TOWEL OR 17X24 6PK STRL BLUE (TOWEL DISPOSABLE) ×2 IMPLANT
UNDERPAD 30X30 (UNDERPADS AND DIAPERS) ×2 IMPLANT

## 2018-01-20 NOTE — Anesthesia Postprocedure Evaluation (Signed)
Anesthesia Post Note  Patient: Shaun Pugh.  Procedure(s) Performed: NAILBED DEBRIDEMENT AND REPAIR (Left Thumb)     Patient location during evaluation: PACU Anesthesia Type: MAC Level of consciousness: awake and alert Pain management: pain level controlled Vital Signs Assessment: post-procedure vital signs reviewed and stable Respiratory status: spontaneous breathing, nonlabored ventilation, respiratory function stable and patient connected to nasal cannula oxygen Cardiovascular status: stable and blood pressure returned to baseline Postop Assessment: no apparent nausea or vomiting Anesthetic complications: no    Last Vitals:  Vitals:   01/20/18 1300 01/20/18 1301  BP: (!) 162/87   Pulse: 66 65  Resp: 15 15  Temp:    SpO2: 100% 100%    Last Pain:  Vitals:   01/20/18 1300  TempSrc:   PainSc: Asleep                 Mccall Will S

## 2018-01-20 NOTE — Discharge Instructions (Signed)
° °  KEEP BANDAGE CLEAN AND DRY CALL OFFICE FOR F/U APPT [phone] IN 7 DAYS RX SENT TO [facility] [place] KEEP HAND ELEVATED ABOVE HEART OK TO APPLY ICE TO OPERATIVE AREA CONTACT OFFICE IF ANY WORSENING PAIN OR CONCERNS.   Call your surgeon if you experience:   1.  Fever over 101.0. 2.  Inability to urinate. 3.  Nausea and/or vomiting. 4.  Extreme swelling or bruising at the surgical site. 5.  Continued bleeding from the incision. 6.  Increased pain, redness or drainage from the incision. 7.  Problems related to your pain medication. 8.  Any problems and/or concerns     Post Anesthesia Home Care Instructions  Activity: Get plenty of rest for the remainder of the day. A responsible individual must stay with you for 24 hours following the procedure.  For the next 24 hours, DO NOT: -Drive a car -Advertising copywriterperate machinery -Drink alcoholic beverages -Take any medication unless instructed by your physician -Make any legal decisions or sign important papers.  Meals: Start with liquid foods such as gelatin or soup. Progress to regular foods as tolerated. Avoid greasy, spicy, heavy foods. If nausea and/or vomiting occur, drink only clear liquids until the nausea and/or vomiting subsides. Call your physician if vomiting continues.  Special Instructions/Symptoms: Your throat may feel dry or sore from the anesthesia or the breathing tube placed in your throat during surgery. If this causes discomfort, gargle with warm salt water. The discomfort should disappear within 24 hours.  If you had a scopolamine patch placed behind your ear for the management of post- operative nausea and/or vomiting:  1. The medication in the patch is effective for 72 hours, after which it should be removed.  Wrap patch in a tissue and discard in the trash. Wash hands thoroughly with soap and water. 2. You may remove the patch earlier than 72 hours if you experience unpleasant side effects which may include dry mouth,  dizziness or visual disturbances. 3. Avoid touching the patch. Wash your hands with soap and water after contact with the patch.

## 2018-01-20 NOTE — Transfer of Care (Signed)
Immediate Anesthesia Transfer of Care Note  Patient: Shaun Pugh.  Procedure(s) Performed: NAILBED DEBRIDEMENT AND REPAIR (Left Thumb)  Patient Location: PACU  Anesthesia Type:MAC  Level of Consciousness: awake, alert , oriented and patient cooperative  Airway & Oxygen Therapy: Patient Spontanous Breathing and Patient connected to face mask oxygen  Post-op Assessment: Report given to RN and Post -op Vital signs reviewed and stable  Post vital signs: Reviewed and stable  Last Vitals:  Vitals Value Taken Time  BP    Temp    Pulse    Resp    SpO2      Last Pain:  Vitals:   01/20/18 0935  TempSrc:   PainSc: 0-No pain      Patients Stated Pain Goal: 6 (55/37/48 2707)  Complications: No apparent anesthesia complications

## 2018-01-20 NOTE — Op Note (Signed)
PREOPERATIVE DIAGNOSIS:left thumb chronic infection  POSTOPERATIVE DIAGNOSIS:same  ATTENDING SURGEON:Dr. Gilman SchmidtFred Ortman who was scrubbed and present for the entire procedure  ASSISTANT SURGEON:none  ANESTHESIA:local anesthesia with IV sedation  OPERATIVE PROCEDURE: #1: Left thumb mass excision less than 1 1/2 cm subcutaneous mass 2.Left thumb partial nail matrix excision #3: Left thumb partial excision of distal phalanx left thumb bone curettage   IMPLANTS:none  RADIOGRAPHIC INTERPRETATION:none  SURGICAL INDICATIONS:patient is a right-hand-dominant gentleman. The persistent infection to the left thumb. Patient elected undergo the above procedure. Risks benefits and alternatives were discussed in detail with the patient in a signed informed consent was obtained. Risks include but not limited to bleeding infection damage to nearby nerves arteries or tendons loss of motion of the wrists and digits incomplete relief of symptoms and need for further surgical intervention.  SURGICAL TECHNIQUE:patient is properly identified in the preoperative holding area marked for permanent marker made on the left thumb indicate correct operative site. Patient brought back to the operating placed supine on anesthesia and table where the IV sedation was administered. Patient tolerated this well. A well-padded tourniquet was then placed on the left forearm and sealed with the appropriate drape. The left upper extremity was then prepped and draped in normal fat sterile fashion. The local anesthetic was administered. A timeout was called the procedure was then begun. The arm was prepped and draped in normal sterile fashion. Attention was then turned to the left thumb going along the ulnar border of the thumb the mass less than 1.5 cm was then excised along the ulnar margin of the thumb. This was then excised in a marginal fashion. Following this the remaining portion the nail plate was then elevated. Beneath the nail  plate the ulnar margin the nail matrix was then excised. Partial nail plate and nailbed excision was then carried out. Following this curettage of the distal phalanx and partial excision of the bone was then done to remove any bony prominences. The wound was then thoroughly irrigated. After removal of the mass nail plate excision and nailbed excision as well as bone curettage the wound was irrigated the skin was then closed to the nail matrix withabsorbable chromic sutures. Adaptic dressing a sterile compressive bandage then applied. Patient tolerated the procedure well.  POSTOPERATIVE PLAN:atient be discharged to home. Seen back in the office in one week for wound check back into a small protector dressing until the wound is healed keeping it clean and dry at all times. I'll see him back at the 3 week mark. Radiographs at the first visit.

## 2018-01-20 NOTE — Anesthesia Preprocedure Evaluation (Addendum)
Anesthesia Evaluation  Patient identified by MRN, date of birth, ID band Patient awake    Reviewed: Allergy & Precautions, NPO status , Patient's Chart, lab work & pertinent test results  Airway Mallampati: II  TM Distance: >3 FB Neck ROM: Full    Dental no notable dental hx.    Pulmonary sleep apnea , former smoker,    Pulmonary exam normal breath sounds clear to auscultation       Cardiovascular hypertension, + CAD, + Past MI, + Cardiac Stents and + Peripheral Vascular Disease  Normal cardiovascular exam Rhythm:Regular Rate:Normal  date:  Study date: 03/16/2016. Study time: 09:46 AM.  Location:  [facility].  -------------------------------------------------------------------  ------------------------------------------------------------------- Left ventricle:  Poorly visualized. The cavity size was normal. Wall thickness was increased in a pattern of moderate LVH. Systolic function was normal. The estimated ejection fraction was in the range of 50% to 55%. Images were inadequate for LV wall motion assessment. Doppler parameters are consistent with abnormal left ventricular relaxation (grade 1 diastolic dysfunction).  ------------------------------------------------------------------- Aortic valve:   Trileaflet; normal thickness leaflets. Mobility was not restricted.  Doppler:  Transvalvular velocity was within the normal range. There was no stenosis. There was no regurgitation.   ------------------------------------------------------------------- Aorta:  Aortic root: The aortic root was normal in size.  ------------------------------------------------------------------- Mitral valve:   Structurally normal valve.   Mobility was not restricted.  Doppler:  Transvalvular velocity was within the normal range. There was no evidence for stenosis. There was  no regurgitation.  ------------------------------------------------------------------- Left atrium:  The atrium was normal in size.  ------------------------------------------------------------------- Right ventricle:  The cavity size was normal. Wall thickness was normal. Systolic function was normal.    Neuro/Psych CVA, Residual Symptoms negative psych ROS   GI/Hepatic Neg liver ROS, GERD  Medicated,  Endo/Other  diabetes, Insulin DependentMorbid obesity  Renal/GU negative Renal ROS  negative genitourinary   Musculoskeletal negative musculoskeletal ROS (+)   Abdominal   Peds negative pediatric ROS (+)  Hematology On plavix   Anesthesia Other Findings   Reproductive/Obstetrics negative OB ROS                            Anesthesia Physical Anesthesia Plan  ASA: III  Anesthesia Plan: MAC   Post-op Pain Management:    Induction: Intravenous  PONV Risk Score and Plan: 0 and Ondansetron  Airway Management Planned: Simple Face Mask  Additional Equipment:   Intra-op Plan:   Post-operative Plan:   Informed Consent: I have reviewed the patients History and Physical, chart, labs and discussed the procedure including the risks, benefits and alternatives for the proposed anesthesia with the patient or authorized representative who has indicated his/her understanding and acceptance.   Dental advisory given  Plan Discussed with: CRNA, Surgeon and Anesthesiologist  Anesthesia Plan Comments:        Anesthesia Quick Evaluation

## 2018-01-21 LAB — GLUCOSE, CAPILLARY: GLUCOSE-CAPILLARY: 153 mg/dL — AB (ref 70–99)

## 2018-01-24 ENCOUNTER — Encounter (HOSPITAL_BASED_OUTPATIENT_CLINIC_OR_DEPARTMENT_OTHER): Payer: Self-pay | Admitting: Orthopedic Surgery

## 2018-01-25 DIAGNOSIS — I129 Hypertensive chronic kidney disease with stage 1 through stage 4 chronic kidney disease, or unspecified chronic kidney disease: Secondary | ICD-10-CM | POA: Diagnosis not present

## 2018-01-25 DIAGNOSIS — Z0001 Encounter for general adult medical examination with abnormal findings: Secondary | ICD-10-CM | POA: Diagnosis not present

## 2018-01-25 DIAGNOSIS — Z6838 Body mass index (BMI) 38.0-38.9, adult: Secondary | ICD-10-CM | POA: Diagnosis not present

## 2018-01-25 DIAGNOSIS — E6609 Other obesity due to excess calories: Secondary | ICD-10-CM | POA: Diagnosis not present

## 2018-01-27 DIAGNOSIS — Z5189 Encounter for other specified aftercare: Secondary | ICD-10-CM | POA: Diagnosis not present

## 2018-01-27 DIAGNOSIS — L03012 Cellulitis of left finger: Secondary | ICD-10-CM | POA: Diagnosis not present

## 2018-02-01 DIAGNOSIS — E1122 Type 2 diabetes mellitus with diabetic chronic kidney disease: Secondary | ICD-10-CM | POA: Diagnosis not present

## 2018-02-01 DIAGNOSIS — I1310 Hypertensive heart and chronic kidney disease without heart failure, with stage 1 through stage 4 chronic kidney disease, or unspecified chronic kidney disease: Secondary | ICD-10-CM | POA: Diagnosis not present

## 2018-02-01 DIAGNOSIS — N183 Chronic kidney disease, stage 3 (moderate): Secondary | ICD-10-CM | POA: Diagnosis not present

## 2018-02-01 DIAGNOSIS — I69354 Hemiplegia and hemiparesis following cerebral infarction affecting left non-dominant side: Secondary | ICD-10-CM | POA: Diagnosis not present

## 2018-02-21 DIAGNOSIS — B351 Tinea unguium: Secondary | ICD-10-CM | POA: Diagnosis not present

## 2018-02-21 DIAGNOSIS — E1140 Type 2 diabetes mellitus with diabetic neuropathy, unspecified: Secondary | ICD-10-CM | POA: Diagnosis not present

## 2018-04-04 DIAGNOSIS — E1140 Type 2 diabetes mellitus with diabetic neuropathy, unspecified: Secondary | ICD-10-CM | POA: Diagnosis not present

## 2018-04-18 DIAGNOSIS — E119 Type 2 diabetes mellitus without complications: Secondary | ICD-10-CM | POA: Diagnosis not present

## 2018-05-09 DIAGNOSIS — L600 Ingrowing nail: Secondary | ICD-10-CM | POA: Diagnosis not present

## 2018-05-09 DIAGNOSIS — M79675 Pain in left toe(s): Secondary | ICD-10-CM | POA: Diagnosis not present

## 2018-05-25 DIAGNOSIS — M79675 Pain in left toe(s): Secondary | ICD-10-CM | POA: Diagnosis not present

## 2018-05-25 DIAGNOSIS — L600 Ingrowing nail: Secondary | ICD-10-CM | POA: Diagnosis not present

## 2018-06-03 DIAGNOSIS — E1122 Type 2 diabetes mellitus with diabetic chronic kidney disease: Secondary | ICD-10-CM | POA: Diagnosis not present

## 2018-06-03 DIAGNOSIS — E669 Obesity, unspecified: Secondary | ICD-10-CM | POA: Diagnosis not present

## 2018-06-03 DIAGNOSIS — E119 Type 2 diabetes mellitus without complications: Secondary | ICD-10-CM | POA: Diagnosis not present

## 2018-06-03 DIAGNOSIS — E782 Mixed hyperlipidemia: Secondary | ICD-10-CM | POA: Diagnosis not present

## 2018-06-10 DIAGNOSIS — E782 Mixed hyperlipidemia: Secondary | ICD-10-CM | POA: Diagnosis not present

## 2018-06-10 DIAGNOSIS — L409 Psoriasis, unspecified: Secondary | ICD-10-CM | POA: Diagnosis not present

## 2018-06-10 DIAGNOSIS — E1129 Type 2 diabetes mellitus with other diabetic kidney complication: Secondary | ICD-10-CM | POA: Diagnosis not present

## 2018-06-10 DIAGNOSIS — I69354 Hemiplegia and hemiparesis following cerebral infarction affecting left non-dominant side: Secondary | ICD-10-CM | POA: Diagnosis not present

## 2018-06-23 DIAGNOSIS — I69354 Hemiplegia and hemiparesis following cerebral infarction affecting left non-dominant side: Secondary | ICD-10-CM | POA: Diagnosis not present

## 2018-06-23 DIAGNOSIS — N183 Chronic kidney disease, stage 3 (moderate): Secondary | ICD-10-CM | POA: Diagnosis not present

## 2018-06-23 DIAGNOSIS — I129 Hypertensive chronic kidney disease with stage 1 through stage 4 chronic kidney disease, or unspecified chronic kidney disease: Secondary | ICD-10-CM | POA: Diagnosis not present

## 2018-07-22 DIAGNOSIS — I129 Hypertensive chronic kidney disease with stage 1 through stage 4 chronic kidney disease, or unspecified chronic kidney disease: Secondary | ICD-10-CM | POA: Diagnosis not present

## 2018-07-22 DIAGNOSIS — N183 Chronic kidney disease, stage 3 (moderate): Secondary | ICD-10-CM | POA: Diagnosis not present

## 2018-07-22 DIAGNOSIS — I69354 Hemiplegia and hemiparesis following cerebral infarction affecting left non-dominant side: Secondary | ICD-10-CM | POA: Diagnosis not present

## 2018-08-01 DIAGNOSIS — B351 Tinea unguium: Secondary | ICD-10-CM | POA: Diagnosis not present

## 2018-08-01 DIAGNOSIS — E1140 Type 2 diabetes mellitus with diabetic neuropathy, unspecified: Secondary | ICD-10-CM | POA: Diagnosis not present

## 2018-08-01 DIAGNOSIS — M79675 Pain in left toe(s): Secondary | ICD-10-CM | POA: Diagnosis not present

## 2018-08-01 DIAGNOSIS — L600 Ingrowing nail: Secondary | ICD-10-CM | POA: Diagnosis not present

## 2018-08-22 DIAGNOSIS — I129 Hypertensive chronic kidney disease with stage 1 through stage 4 chronic kidney disease, or unspecified chronic kidney disease: Secondary | ICD-10-CM | POA: Diagnosis not present

## 2018-08-22 DIAGNOSIS — N183 Chronic kidney disease, stage 3 (moderate): Secondary | ICD-10-CM | POA: Diagnosis not present

## 2018-08-22 DIAGNOSIS — I69354 Hemiplegia and hemiparesis following cerebral infarction affecting left non-dominant side: Secondary | ICD-10-CM | POA: Diagnosis not present

## 2018-09-02 DIAGNOSIS — Z0000 Encounter for general adult medical examination without abnormal findings: Secondary | ICD-10-CM | POA: Diagnosis not present

## 2018-09-15 DIAGNOSIS — I129 Hypertensive chronic kidney disease with stage 1 through stage 4 chronic kidney disease, or unspecified chronic kidney disease: Secondary | ICD-10-CM | POA: Diagnosis not present

## 2018-09-15 DIAGNOSIS — E1122 Type 2 diabetes mellitus with diabetic chronic kidney disease: Secondary | ICD-10-CM | POA: Diagnosis not present

## 2018-09-15 DIAGNOSIS — R739 Hyperglycemia, unspecified: Secondary | ICD-10-CM | POA: Diagnosis not present

## 2018-09-15 DIAGNOSIS — E782 Mixed hyperlipidemia: Secondary | ICD-10-CM | POA: Diagnosis not present

## 2018-09-21 DIAGNOSIS — I69354 Hemiplegia and hemiparesis following cerebral infarction affecting left non-dominant side: Secondary | ICD-10-CM | POA: Diagnosis not present

## 2018-09-21 DIAGNOSIS — I129 Hypertensive chronic kidney disease with stage 1 through stage 4 chronic kidney disease, or unspecified chronic kidney disease: Secondary | ICD-10-CM | POA: Diagnosis not present

## 2018-09-21 DIAGNOSIS — N183 Chronic kidney disease, stage 3 (moderate): Secondary | ICD-10-CM | POA: Diagnosis not present

## 2018-09-29 DIAGNOSIS — E119 Type 2 diabetes mellitus without complications: Secondary | ICD-10-CM | POA: Diagnosis not present

## 2018-09-29 DIAGNOSIS — E1165 Type 2 diabetes mellitus with hyperglycemia: Secondary | ICD-10-CM | POA: Diagnosis not present

## 2018-09-29 DIAGNOSIS — E1122 Type 2 diabetes mellitus with diabetic chronic kidney disease: Secondary | ICD-10-CM | POA: Diagnosis not present

## 2018-09-29 DIAGNOSIS — E782 Mixed hyperlipidemia: Secondary | ICD-10-CM | POA: Diagnosis not present

## 2018-10-07 DIAGNOSIS — I129 Hypertensive chronic kidney disease with stage 1 through stage 4 chronic kidney disease, or unspecified chronic kidney disease: Secondary | ICD-10-CM | POA: Diagnosis not present

## 2018-10-07 DIAGNOSIS — N183 Chronic kidney disease, stage 3 (moderate): Secondary | ICD-10-CM | POA: Diagnosis not present

## 2018-10-07 DIAGNOSIS — E782 Mixed hyperlipidemia: Secondary | ICD-10-CM | POA: Diagnosis not present

## 2018-10-07 DIAGNOSIS — E1129 Type 2 diabetes mellitus with other diabetic kidney complication: Secondary | ICD-10-CM | POA: Diagnosis not present

## 2018-10-10 DIAGNOSIS — M79675 Pain in left toe(s): Secondary | ICD-10-CM | POA: Diagnosis not present

## 2018-10-10 DIAGNOSIS — L600 Ingrowing nail: Secondary | ICD-10-CM | POA: Diagnosis not present

## 2018-10-12 DIAGNOSIS — E1122 Type 2 diabetes mellitus with diabetic chronic kidney disease: Secondary | ICD-10-CM | POA: Diagnosis not present

## 2018-10-12 DIAGNOSIS — R739 Hyperglycemia, unspecified: Secondary | ICD-10-CM | POA: Diagnosis not present

## 2018-10-12 DIAGNOSIS — E119 Type 2 diabetes mellitus without complications: Secondary | ICD-10-CM | POA: Diagnosis not present

## 2018-10-12 DIAGNOSIS — I129 Hypertensive chronic kidney disease with stage 1 through stage 4 chronic kidney disease, or unspecified chronic kidney disease: Secondary | ICD-10-CM | POA: Diagnosis not present

## 2018-10-22 DIAGNOSIS — I129 Hypertensive chronic kidney disease with stage 1 through stage 4 chronic kidney disease, or unspecified chronic kidney disease: Secondary | ICD-10-CM | POA: Diagnosis not present

## 2018-10-22 DIAGNOSIS — I69354 Hemiplegia and hemiparesis following cerebral infarction affecting left non-dominant side: Secondary | ICD-10-CM | POA: Diagnosis not present

## 2018-10-22 DIAGNOSIS — N183 Chronic kidney disease, stage 3 (moderate): Secondary | ICD-10-CM | POA: Diagnosis not present

## 2018-10-24 DIAGNOSIS — L600 Ingrowing nail: Secondary | ICD-10-CM | POA: Diagnosis not present

## 2018-10-24 DIAGNOSIS — B351 Tinea unguium: Secondary | ICD-10-CM | POA: Diagnosis not present

## 2018-10-24 DIAGNOSIS — E1140 Type 2 diabetes mellitus with diabetic neuropathy, unspecified: Secondary | ICD-10-CM | POA: Diagnosis not present

## 2018-11-11 DIAGNOSIS — E1122 Type 2 diabetes mellitus with diabetic chronic kidney disease: Secondary | ICD-10-CM | POA: Diagnosis not present

## 2018-11-11 DIAGNOSIS — R739 Hyperglycemia, unspecified: Secondary | ICD-10-CM | POA: Diagnosis not present

## 2018-11-11 DIAGNOSIS — E119 Type 2 diabetes mellitus without complications: Secondary | ICD-10-CM | POA: Diagnosis not present

## 2018-11-11 DIAGNOSIS — I129 Hypertensive chronic kidney disease with stage 1 through stage 4 chronic kidney disease, or unspecified chronic kidney disease: Secondary | ICD-10-CM | POA: Diagnosis not present

## 2018-11-21 DIAGNOSIS — N183 Chronic kidney disease, stage 3 (moderate): Secondary | ICD-10-CM | POA: Diagnosis not present

## 2018-11-21 DIAGNOSIS — I129 Hypertensive chronic kidney disease with stage 1 through stage 4 chronic kidney disease, or unspecified chronic kidney disease: Secondary | ICD-10-CM | POA: Diagnosis not present

## 2018-11-21 DIAGNOSIS — I69354 Hemiplegia and hemiparesis following cerebral infarction affecting left non-dominant side: Secondary | ICD-10-CM | POA: Diagnosis not present

## 2018-12-19 DIAGNOSIS — E119 Type 2 diabetes mellitus without complications: Secondary | ICD-10-CM | POA: Diagnosis not present

## 2018-12-19 DIAGNOSIS — E1122 Type 2 diabetes mellitus with diabetic chronic kidney disease: Secondary | ICD-10-CM | POA: Diagnosis not present

## 2018-12-19 DIAGNOSIS — I129 Hypertensive chronic kidney disease with stage 1 through stage 4 chronic kidney disease, or unspecified chronic kidney disease: Secondary | ICD-10-CM | POA: Diagnosis not present

## 2018-12-19 DIAGNOSIS — R739 Hyperglycemia, unspecified: Secondary | ICD-10-CM | POA: Diagnosis not present

## 2018-12-22 DIAGNOSIS — I129 Hypertensive chronic kidney disease with stage 1 through stage 4 chronic kidney disease, or unspecified chronic kidney disease: Secondary | ICD-10-CM | POA: Diagnosis not present

## 2018-12-22 DIAGNOSIS — I69354 Hemiplegia and hemiparesis following cerebral infarction affecting left non-dominant side: Secondary | ICD-10-CM | POA: Diagnosis not present

## 2018-12-22 DIAGNOSIS — N183 Chronic kidney disease, stage 3 (moderate): Secondary | ICD-10-CM | POA: Diagnosis not present

## 2019-01-02 DIAGNOSIS — M79675 Pain in left toe(s): Secondary | ICD-10-CM | POA: Diagnosis not present

## 2019-01-02 DIAGNOSIS — E1140 Type 2 diabetes mellitus with diabetic neuropathy, unspecified: Secondary | ICD-10-CM | POA: Diagnosis not present

## 2019-01-02 DIAGNOSIS — B351 Tinea unguium: Secondary | ICD-10-CM | POA: Diagnosis not present

## 2019-01-17 DIAGNOSIS — H5203 Hypermetropia, bilateral: Secondary | ICD-10-CM | POA: Diagnosis not present

## 2019-01-20 DIAGNOSIS — E1122 Type 2 diabetes mellitus with diabetic chronic kidney disease: Secondary | ICD-10-CM | POA: Diagnosis not present

## 2019-01-20 DIAGNOSIS — E119 Type 2 diabetes mellitus without complications: Secondary | ICD-10-CM | POA: Diagnosis not present

## 2019-01-20 DIAGNOSIS — I129 Hypertensive chronic kidney disease with stage 1 through stage 4 chronic kidney disease, or unspecified chronic kidney disease: Secondary | ICD-10-CM | POA: Diagnosis not present

## 2019-01-20 DIAGNOSIS — R739 Hyperglycemia, unspecified: Secondary | ICD-10-CM | POA: Diagnosis not present

## 2019-01-22 DIAGNOSIS — N183 Chronic kidney disease, stage 3 (moderate): Secondary | ICD-10-CM | POA: Diagnosis not present

## 2019-01-22 DIAGNOSIS — I69354 Hemiplegia and hemiparesis following cerebral infarction affecting left non-dominant side: Secondary | ICD-10-CM | POA: Diagnosis not present

## 2019-01-22 DIAGNOSIS — I129 Hypertensive chronic kidney disease with stage 1 through stage 4 chronic kidney disease, or unspecified chronic kidney disease: Secondary | ICD-10-CM | POA: Diagnosis not present

## 2019-02-06 DIAGNOSIS — E1122 Type 2 diabetes mellitus with diabetic chronic kidney disease: Secondary | ICD-10-CM | POA: Diagnosis not present

## 2019-02-06 DIAGNOSIS — E119 Type 2 diabetes mellitus without complications: Secondary | ICD-10-CM | POA: Diagnosis not present

## 2019-02-06 DIAGNOSIS — E782 Mixed hyperlipidemia: Secondary | ICD-10-CM | POA: Diagnosis not present

## 2019-02-06 DIAGNOSIS — E1129 Type 2 diabetes mellitus with other diabetic kidney complication: Secondary | ICD-10-CM | POA: Diagnosis not present

## 2019-02-10 DIAGNOSIS — I129 Hypertensive chronic kidney disease with stage 1 through stage 4 chronic kidney disease, or unspecified chronic kidney disease: Secondary | ICD-10-CM | POA: Diagnosis not present

## 2019-02-10 DIAGNOSIS — N183 Chronic kidney disease, stage 3 unspecified: Secondary | ICD-10-CM | POA: Diagnosis not present

## 2019-02-10 DIAGNOSIS — E782 Mixed hyperlipidemia: Secondary | ICD-10-CM | POA: Diagnosis not present

## 2019-02-10 DIAGNOSIS — E1129 Type 2 diabetes mellitus with other diabetic kidney complication: Secondary | ICD-10-CM | POA: Diagnosis not present

## 2019-02-14 ENCOUNTER — Other Ambulatory Visit: Payer: Self-pay

## 2019-02-14 DIAGNOSIS — I6521 Occlusion and stenosis of right carotid artery: Secondary | ICD-10-CM

## 2019-02-15 DIAGNOSIS — R739 Hyperglycemia, unspecified: Secondary | ICD-10-CM | POA: Diagnosis not present

## 2019-02-15 DIAGNOSIS — I129 Hypertensive chronic kidney disease with stage 1 through stage 4 chronic kidney disease, or unspecified chronic kidney disease: Secondary | ICD-10-CM | POA: Diagnosis not present

## 2019-02-15 DIAGNOSIS — E1122 Type 2 diabetes mellitus with diabetic chronic kidney disease: Secondary | ICD-10-CM | POA: Diagnosis not present

## 2019-02-15 DIAGNOSIS — E119 Type 2 diabetes mellitus without complications: Secondary | ICD-10-CM | POA: Diagnosis not present

## 2019-02-20 ENCOUNTER — Encounter: Payer: Self-pay | Admitting: Family

## 2019-02-20 ENCOUNTER — Other Ambulatory Visit: Payer: Self-pay

## 2019-02-20 ENCOUNTER — Ambulatory Visit (HOSPITAL_COMMUNITY)
Admission: RE | Admit: 2019-02-20 | Discharge: 2019-02-20 | Disposition: A | Discharge status: Home / Self Care | Payer: Medicare Other | Source: Ambulatory Visit | Attending: Family | Admitting: Family

## 2019-02-20 ENCOUNTER — Ambulatory Visit: Payer: Medicare Other | Admitting: Family

## 2019-02-20 VITALS — BP 135/80 | HR 57 | Temp 98.1°F | Resp 18 | Ht 69.0 in | Wt 260.0 lb

## 2019-02-20 DIAGNOSIS — Z8673 Personal history of transient ischemic attack (TIA), and cerebral infarction without residual deficits: Secondary | ICD-10-CM | POA: Diagnosis not present

## 2019-02-20 DIAGNOSIS — Z9889 Other specified postprocedural states: Secondary | ICD-10-CM | POA: Diagnosis not present

## 2019-02-20 DIAGNOSIS — Z87891 Personal history of nicotine dependence: Secondary | ICD-10-CM | POA: Diagnosis not present

## 2019-02-20 DIAGNOSIS — I6521 Occlusion and stenosis of right carotid artery: Secondary | ICD-10-CM | POA: Diagnosis not present

## 2019-02-20 NOTE — Progress Notes (Signed)
Virtual Visit via Telephone Note   I connected with Shaun Pugh. on 02/20/2019 using the [employer] by telephone and verified that I was speaking with the correct person using two identifiers. Patient was located at his cell phone and accompanied by his wife. I am located at the [facility] office/clinic.   The limitations of evaluation and management by telemedicine and the availability of in person appointments have been previously discussed with the patient and are documented in the patients chart. The patient expressed understanding and consented to proceed.  PCP: Shaun Stabile, MD  Chief Complaint: Follow up extracranial carotid artery stenosis   History of Present Illness: Shaun Sonneborn. is a 68 y.o. male who is s/pright CEAon4/27/2018 by Dr. Myra Gianotti. Intraoperative findings included an ulcerated plaque at the bifurcation. He had poor backbleeding from the internal carotid artery. His procedure was done for symptomatic stenosis.   He has a history of a right brain stroke which left him with left-sided weakness back in 2013. This was secondary to aortic and innominate artery thrombus. Wife states pt had another stroke in November 2017 that caused visual problems in both eyes: limited bilateral peripheral vision and loss of vision in the lower half of both visual fields.  He and wife deny any known subsequent stroke or TIA sx's. Previously, he had been on Coumadin but was changed to aspirin and Plavix.  Dr. Gerald Leitz last evaluated pt on 09-21-16. At that time the patientwas at his baseline neurologic exam. Hewas complaining of someneckdiscomfort whichwastypical in the postoperative period. Dr. Myra Gianotti advisedfollow-up in 6 months with a repeat carotid duplex.  He is not able to tolerate Crestor, Lipitor, or low dose pravastatin, not able to afford co-pay for PCSK9 inhibitors. He was released by Dr. Roda Shutters, neurologist.  Diabetic:yes,last A1C was 7.4 per pt Tobacco  NOI:BBCWUG smoker, quitin 2013, smoked x 45years   Pt meds include: Statin :no, caused myalgias in legs, even with low doses less than daily ASA:yes Other anticoagulants/antiplatelets:Plavix   Past Medical History:  Diagnosis Date  . Acute pancreatitis 09/14/2011  . Arthritis    Back  . Atrophy of right kidney 09/15/2011  . CAD (coronary artery disease)    DES to mid LAD and distal RCA 2005 - Dr. Amil Amen  . Carotid artery stenosis    Right side  . Carpal tunnel syndrome, bilateral   . Cerebral infarction due to posterior cerebral artery occlusion (HCC)   . Chronic back pain   . Essential hypertension   . GERD (gastroesophageal reflux disease)   . Grade II diastolic dysfunction 12/02/2011   noted on ECHO  . History of gallstones   . History of hepatomegaly 09/14/2011   Noted on CT abd/pelvis  . History of kidney stones   . History of stroke 03/18/2012   Aortic atheroma implicated, was on Coumadin for a period of time and more recently aspirin and Plavix  . History of toe fracture   . Hydroureteronephrosis 05/20/2013   Mod to severe right, mild perinephric stranding secondary to 10x7 mm calculus distal right ureter  . Hyperlipidemia   . ICAO (internal carotid artery occlusion), right    80-90%  . Iliac artery aneurysm (HCC) 09/14/2011   Right common, nonaneurysmal dilatation of the infrrenal aorta, noted on CT abd/pelvis  . Kidney calculi 09/15/2011  . Left thyroid nodule   . LVH (left ventricular hypertrophy) 03/16/2016   Moderate, noted on ECHO  . Meibomian gland dysfunction   . Myocardial infarction (HCC)   .  Pneumonia 03/2012  . PONV (postoperative nausea and vomiting)   . Retinal artery branch occlusion 11/2011   secondary to thromboembolic disease, lost vision in Left eye  . Seizures (HCC)    Following stroke  . Senile cataracts of both eyes   . Sleep apnea    Reportedly intolerant of CPAP  . Stroke (HCC) 11/2011   large right MCA, residual left hemiparesis   . Stroke (HCC) 03/2016   left occipital lobe PCA infarct, residual partial loss of visual fields  . Type 2 diabetes mellitus (HCC)   . Wears dentures    upper and lower    Past Surgical History:  Procedure Laterality Date  . BACK SURGERY     LUMBAR SURGERIES x 4 - INCLUDING FUSIONS  . CARPAL TUNNEL REPAIR Left   . CHOLECYSTECTOMY  09/25/2011   Procedure: LAPAROSCOPIC CHOLECYSTECTOMY;  Surgeon: Fabio BeringBrent C Ziegler, MD;  Location: [facility];  Service: General;  Laterality: N/A;  . COLONOSCOPY    . CORONARY ANGIOPLASTY  2005   Heart stents  . CYSTOSCOPY WITH RETROGRADE PYELOGRAM, URETEROSCOPY AND STENT PLACEMENT Right 05/26/2013   Procedure: CYSTOSCOPY, RIGHT RETROGRADE PYELOGRAM, RIGHT URETEROSCOPY AND RIGHT STENT PLACEMENT, stone extraction, biopsy;  Surgeon: Crist FatBenjamin W Herrick, MD;  Location: [facility];  Service: Urology;  Laterality: Right;  . ENDARTERECTOMY Right 09/04/2016   Procedure: ENDARTERECTOMY CAROTID;  Surgeon: Nada LibmanVance W Brabham, MD;  [facility];  Service: Vascular;  Laterality: Right;  . HERNIA REPAIR     INGUINAL HERNIA REPAIR MAYBE 30 YRS AGO  . MINOR NAILBED REPAIR Left 01/20/2018   Procedure: NAILBED DEBRIDEMENT AND REPAIR;  Surgeon: Bradly Bienenstockrtmann, Fred, MD;  [facility];  Service: Orthopedics;  Laterality: Left;  Marland Kitchen. MULTIPLE TOOTH EXTRACTIONS      Current Meds  Medication Sig  . aspirin EC 325 MG tablet Take 1 tablet (325 mg total) by mouth daily.  . calcium-vitamin D (OSCAL WITH D) 500-200 MG-UNIT tablet Take 1 tablet by mouth daily with breakfast.  . clobetasol cream (TEMOVATE) 0.05 % Apply 1 application topically as needed.   . clopidogrel (PLAVIX) 75 MG tablet TAKE 1 TABLET BY MOUTH ONCE DAILY  . escitalopram (LEXAPRO) 10 MG tablet Take 10 mg by mouth daily.   . fluticasone (CUTIVATE) 0.05 % cream Apply 1 application topically as needed.   Marland Kitchen. glipiZIDE (GLUCOTROL XL) 10 MG 24 hr tablet Take 10 mg by mouth daily with breakfast.  . insulin glargine  (LANTUS) 100 UNIT/ML injection Inject 60 Units into the skin at bedtime.  Marland Kitchen. lisinopril-hydrochlorothiazide (PRINZIDE,ZESTORETIC) 10-12.5 MG tablet Take 0.5 tablets by mouth daily.   . metFORMIN (GLUCOPHAGE) 500 MG tablet Take 500 mg by mouth 2 (two) times daily with a meal.  . pantoprazole (PROTONIX) 40 MG tablet Take 1 tablet by mouth daily.  . vitamin B-12 (CYANOCOBALAMIN) 1000 MCG tablet Take 1,000 mcg by mouth daily.    12 system ROS was negative unless otherwise noted in HPI   Observations/Objective:  DATA  Carotid Duplex (02-20-19): Right Carotid: Velocities in the right ICA are consistent with a 1-39% stenosis.                Patent endarterectomy. Left Carotid: Velocities in the left ICA are consistent with a 1-39% stenosis.               Non-hemodynamically significant plaque <50% noted in the CCA. Vertebrals:  Bilateral vertebral arteries demonstrate antegrade flow. Subclavians: Normal flow hemodynamics were seen in bilateral subclavian arteries.  Slight increased stenosis in the right ICA compared to the exam on 08-23-17, stable in the left ICA.     Assessment and Plan:  Assessment: Shaun Pugh. is a 68 y.o. male who is s/p right CEAon4/27/2018. He had a stroke in 2013with residual left hemiparesis, and November of 2017with residual partial loss of visual fields, no stroke or TIA since then.  Today's carotid duplex shows 1-39% stenosis of the bilateral ICA.   His atherosclerotic risk factors include almost in control DM, 45 year smoking hx (quit in 2013), CAD with hx of MI and CABG, statin intolerance, and obesity.    Follow Up Instructions:   Follow up 1 year with carotid duplex.    I discussed the assessment and treatment plan with the patient. The patient was provided an opportunity to ask questions and all were answered. The patient agreed with the plan and demonstrated an understanding of the instructions.   The patient was advised to call back or seek  an in-person evaluation if the symptoms worsen or if the condition fails to improve as anticipated.  I spent 5 minutes with the patient via telephone encounter.   Gabrielle Dare Latrisha Coiro [facility] Office: [phone]  02/20/2019, 11:31 AM

## 2019-02-20 NOTE — Patient Instructions (Signed)

## 2019-03-13 DIAGNOSIS — B351 Tinea unguium: Secondary | ICD-10-CM | POA: Diagnosis not present

## 2019-03-13 DIAGNOSIS — M79675 Pain in left toe(s): Secondary | ICD-10-CM | POA: Diagnosis not present

## 2019-03-13 DIAGNOSIS — E1140 Type 2 diabetes mellitus with diabetic neuropathy, unspecified: Secondary | ICD-10-CM | POA: Diagnosis not present

## 2019-03-20 DIAGNOSIS — Z1211 Encounter for screening for malignant neoplasm of colon: Secondary | ICD-10-CM | POA: Diagnosis not present

## 2019-03-24 DIAGNOSIS — I129 Hypertensive chronic kidney disease with stage 1 through stage 4 chronic kidney disease, or unspecified chronic kidney disease: Secondary | ICD-10-CM | POA: Diagnosis not present

## 2019-03-24 DIAGNOSIS — I69354 Hemiplegia and hemiparesis following cerebral infarction affecting left non-dominant side: Secondary | ICD-10-CM | POA: Diagnosis not present

## 2019-03-24 DIAGNOSIS — N183 Chronic kidney disease, stage 3 unspecified: Secondary | ICD-10-CM | POA: Diagnosis not present

## 2019-03-28 DIAGNOSIS — E782 Mixed hyperlipidemia: Secondary | ICD-10-CM | POA: Diagnosis not present

## 2019-03-28 DIAGNOSIS — I129 Hypertensive chronic kidney disease with stage 1 through stage 4 chronic kidney disease, or unspecified chronic kidney disease: Secondary | ICD-10-CM | POA: Diagnosis not present

## 2019-03-28 DIAGNOSIS — R739 Hyperglycemia, unspecified: Secondary | ICD-10-CM | POA: Diagnosis not present

## 2019-03-28 DIAGNOSIS — E1122 Type 2 diabetes mellitus with diabetic chronic kidney disease: Secondary | ICD-10-CM | POA: Diagnosis not present

## 2019-04-14 DIAGNOSIS — E782 Mixed hyperlipidemia: Secondary | ICD-10-CM | POA: Diagnosis not present

## 2019-04-14 DIAGNOSIS — E1122 Type 2 diabetes mellitus with diabetic chronic kidney disease: Secondary | ICD-10-CM | POA: Diagnosis not present

## 2019-04-14 DIAGNOSIS — N183 Chronic kidney disease, stage 3 unspecified: Secondary | ICD-10-CM | POA: Diagnosis not present

## 2019-04-14 DIAGNOSIS — I129 Hypertensive chronic kidney disease with stage 1 through stage 4 chronic kidney disease, or unspecified chronic kidney disease: Secondary | ICD-10-CM | POA: Diagnosis not present

## 2019-05-15 DIAGNOSIS — I129 Hypertensive chronic kidney disease with stage 1 through stage 4 chronic kidney disease, or unspecified chronic kidney disease: Secondary | ICD-10-CM | POA: Diagnosis not present

## 2019-05-15 DIAGNOSIS — N189 Chronic kidney disease, unspecified: Secondary | ICD-10-CM | POA: Diagnosis not present

## 2019-05-15 DIAGNOSIS — E782 Mixed hyperlipidemia: Secondary | ICD-10-CM | POA: Diagnosis not present

## 2019-05-15 DIAGNOSIS — E1122 Type 2 diabetes mellitus with diabetic chronic kidney disease: Secondary | ICD-10-CM | POA: Diagnosis not present

## 2019-06-13 DIAGNOSIS — I129 Hypertensive chronic kidney disease with stage 1 through stage 4 chronic kidney disease, or unspecified chronic kidney disease: Secondary | ICD-10-CM | POA: Diagnosis not present

## 2019-06-13 DIAGNOSIS — E1122 Type 2 diabetes mellitus with diabetic chronic kidney disease: Secondary | ICD-10-CM | POA: Diagnosis not present

## 2019-06-13 DIAGNOSIS — N189 Chronic kidney disease, unspecified: Secondary | ICD-10-CM | POA: Diagnosis not present

## 2019-06-13 DIAGNOSIS — E782 Mixed hyperlipidemia: Secondary | ICD-10-CM | POA: Diagnosis not present

## 2019-08-14 ENCOUNTER — Encounter: Payer: Self-pay | Admitting: Cardiology

## 2019-08-14 ENCOUNTER — Telehealth: Payer: Self-pay

## 2019-08-14 NOTE — Progress Notes (Signed)
Virtual Visit via Telephone Note   This visit type was conducted due to national recommendations for restrictions regarding the COVID-19 Pandemic (e.g. social distancing) in an effort to limit this patient's exposure and mitigate transmission in our community.  Due to his co-morbid illnesses, this patient is at least at moderate risk for complications without adequate follow up.  This format is felt to be most appropriate for this patient at this time.  The patient did not have access to video technology/had technical difficulties with video requiring transitioning to audio format only (telephone).  All issues noted in this document were discussed and addressed.  No physical exam could be performed with this format.  Please refer to the patient's chart for his  consent to telehealth for [facility].   The patient was identified using 2 identifiers.  Date:  08/15/2019   ID:  Shaun Pugh., DOB 10-Jan-1951, MRN [id number]  Patient Location: Home Provider Location: Home  PCP:  Benita Stabile, MD  Cardiologist:  Nona Dell, MD Electrophysiologist:  None   Evaluation Performed:  Follow-Up Visit  Chief Complaint:  Cardiac follow-up  History of Present Illness:    Shaun Pugh. is a 69 y.o. male last seen in April 2018.  We spoke by phone today.  He does not report any obvious angina symptoms on medical therapy.  He continues to see Dr. [name] on a regular basis, has follow-up lab work and evaluation pending for later this month.  He is following with [facility], seen in October 2020.  He underwent right CEA in April 2018 per Dr. Myra Gianotti.  I reviewed his medications which are outlined below.  No change from a cardiac perspective.  We are requesting his most recent lab work from Dr. [name].  The patient does not have symptoms concerning for COVID-19 infection (fever, chills, cough, or new shortness of breath).    Past Medical History:  Diagnosis Date  . Acute pancreatitis 09/14/2011  .  Atrophy of right kidney 09/15/2011  . CAD (coronary artery disease)    DES to mid LAD and distal RCA 2005 - Dr. Amil Amen  . Carotid artery disease (HCC)   . Carpal tunnel syndrome, bilateral   . Chronic back pain   . Essential hypertension   . GERD (gastroesophageal reflux disease)   . History of gallstones   . History of kidney stones   . History of stroke 03/18/2012   Aortic atheroma implicated, was on Coumadin for a period of time and more recently aspirin and Plavix  . History of stroke 03/2016   Left occipital lobe PCA infarct, residual partial loss of visual fields  . History of stroke 11/2011   Large right MCA, residual left hemiparesis  . History of toe fracture   . Hydroureteronephrosis 05/20/2013   Mod to severe right, mild perinephric stranding secondary to 10x7 mm calculus distal right ureter  . Hyperlipidemia   . Iliac artery aneurysm (HCC) 09/14/2011   Right common, nonaneurysmal dilatation of the infrrenal aorta, noted on CT abd/pelvis  . Left thyroid nodule   . Meibomian gland dysfunction   . Myocardial infarction (HCC)   . Pneumonia 03/2012  . Retinal artery branch occlusion 11/2011   Secondary to thromboembolic disease, lost vision in Left eye  . Seizures (HCC)    Following stroke  . Senile cataracts of both eyes   . Sleep apnea    Reportedly intolerant of CPAP  . Type 2 diabetes mellitus (HCC)   . Wears dentures  Past Surgical History:  Procedure Laterality Date  . BACK SURGERY     LUMBAR SURGERIES x 4 - INCLUDING FUSIONS  . CARPAL TUNNEL REPAIR Left   . CHOLECYSTECTOMY  09/25/2011   Procedure: LAPAROSCOPIC CHOLECYSTECTOMY;  Surgeon: Donato Heinz, MD;  Location: [facility];  Service: General;  Laterality: N/A;  . COLONOSCOPY    . CORONARY ANGIOPLASTY  2005   Heart stents  . CYSTOSCOPY WITH RETROGRADE PYELOGRAM, URETEROSCOPY AND STENT PLACEMENT Right 05/26/2013   Procedure: CYSTOSCOPY, RIGHT RETROGRADE PYELOGRAM, RIGHT URETEROSCOPY AND RIGHT STENT  PLACEMENT, stone extraction, biopsy;  Surgeon: Ardis Hughs, MD;  Location: [facility];  Service: Urology;  Laterality: Right;  . ENDARTERECTOMY Right 09/04/2016   Procedure: ENDARTERECTOMY CAROTID;  Surgeon: Serafina Mitchell, MD;  Location: [place];  Service: Vascular;  Laterality: Right;  . HERNIA REPAIR     INGUINAL HERNIA REPAIR MAYBE 30 YRS AGO  . MINOR NAILBED REPAIR Left 01/20/2018   Procedure: NAILBED DEBRIDEMENT AND REPAIR;  Surgeon: Iran Planas, MD;  [facility];  Service: Orthopedics;  Laterality: Left;  Marland Kitchen MULTIPLE TOOTH EXTRACTIONS       Current Meds  Medication Sig  . aspirin EC 325 MG tablet Take 1 tablet (325 mg total) by mouth daily.  . calcium-vitamin D (OSCAL WITH D) 500-200 MG-UNIT tablet Take 1 tablet by mouth daily with breakfast.  . clobetasol cream (TEMOVATE) 2.02 % Apply 1 application topically as needed.   . clopidogrel (PLAVIX) 75 MG tablet TAKE 1 TABLET BY MOUTH ONCE DAILY  . escitalopram (LEXAPRO) 10 MG tablet Take 10 mg by mouth daily.   . fluticasone (CUTIVATE) 0.05 % cream Apply 1 application topically as needed.   Marland Kitchen glipiZIDE (GLUCOTROL XL) 10 MG 24 hr tablet Take 10 mg by mouth daily with breakfast.  . insulin glargine (LANTUS) 100 UNIT/ML injection Inject 60 Units into the skin at bedtime.  Marland Kitchen lisinopril-hydrochlorothiazide (PRINZIDE,ZESTORETIC) 10-12.5 MG tablet Take 0.5 tablets by mouth daily.   . metFORMIN (GLUCOPHAGE) 500 MG tablet Take 500 mg by mouth 2 (two) times daily with a meal.  . pantoprazole (PROTONIX) 40 MG tablet Take 1 tablet by mouth daily.  . vitamin B-12 (CYANOCOBALAMIN) 1000 MCG tablet Take 1,000 mcg by mouth daily.     Allergies:   Atorvastatin calcium, Crestor [rosuvastatin calcium], Lipitor [atorvastatin], and Other   ROS:   No palpitations or syncope.  Prior CV studies:   The following studies were reviewed today:  Lexiscan Myoview 08/28/2016:  There was no ST segment deviation noted during stress.   Findings consistent with mild apical ischemia and mild to moderate basal inferior ischemia.  This is an intermediate risk study. Combined fairly moderate area of myocardium at currently at jeopardy without high risk findings.  The left ventricular ejection fraction is hyperdynamic (>65%).  Carotid Dopplers 02/20/2019: Summary:  Right Carotid: Velocities in the right ICA are consistent with a 1-39%  stenosis.         Patent endarterectomy.   Left Carotid: Velocities in the left ICA are consistent with a 1-39%  stenosis.        Non-hemodynamically significant plaque <50% noted in the  CCA.   Vertebrals: Bilateral vertebral arteries demonstrate antegrade flow.  Subclavians: Normal flow hemodynamics were seen in bilateral subclavian        arteries.   Labs/Other Tests and Data Reviewed:    EKG:  An ECG dated 03/13/2016 was personally reviewed today and demonstrated:  Sinus rhythm with right bundle  branch block.  Recent Labs: Lab Results  Component Value Date/Time   CHOL 189 03/14/2016 01:06 AM   TRIG 255 (H) 03/14/2016 01:06 AM   HDL 25 (L) 03/14/2016 01:06 AM   CHOLHDL 7.6 03/14/2016 01:06 AM   LDLCALC 113 (H) 03/14/2016 01:06 AM    Wt Readings from Last 3 Encounters:  02/20/19 260 lb (117.9 kg)  01/20/18 275 lb (124.7 kg)  08/23/17 273 lb 9.6 oz (124.1 kg)     Objective:    Vital Signs:  Ht 5\' 9"  (1.753 m)   BMI 38.40 kg/m    Unable to obtain vital signs today. Patient spoke in short sentences, obviously short of breath. No audible wheezing or coughing.  ASSESSMENT & PLAN:    1.  CAD status post DES to the mid LAD and distal RCA back in 2005.  He does not describe any active angina at this time on medical therapy which includes aspirin, Plavix, and Prinzide.  Myoview from 2018 demonstrated mild apical ischemia and mild to moderate basal inferior ischemia.  Plan to continue medical therapy in the absence of progressive angina symptoms.  2.   Carotid artery disease status post right carotid endarterectomy in 2018.  Keep follow-up with Dr. 2019.  3.  History of hyperlipidemia, previously on Pravachol.  He has been following with Dr. Myra Gianotti.  Requesting interval lab work.  Time:   Today, I have spent 5 minutes with the patient with telehealth technology discussing the above problems.     Medication Adjustments/Labs and Tests Ordered: Current medicines are reviewed at length with the patient today.  Concerns regarding medicines are outlined above.   Tests Ordered: No orders of the defined types were placed in this encounter.   Medication Changes: No orders of the defined types were placed in this encounter.   Follow Up:  In Person 1 year in the [place] office.  Signed, G[place], MD  08/15/2019 10:46 AM    [facility]

## 2019-08-14 NOTE — Telephone Encounter (Signed)
  Patient Consent for Virtual Visit         Shaun Pugh. has provided verbal consent on 08/14/2019 for a virtual visit (video or telephone).   CONSENT FOR VIRTUAL VISIT FOR:  Shaun Pugh.  By participating in this virtual visit I agree to the following:  I hereby voluntarily request, consent and authorize [facility] and its employed or contracted physicians, physician assistants, nurse practitioners or other licensed health care professionals (the Practitioner), to provide me with telemedicine health care services (the "Services") as deemed necessary by the treating Practitioner. I acknowledge and consent to receive the Services by the Practitioner via telemedicine. I understand that the telemedicine visit will involve communicating with the Practitioner through live audiovisual communication technology and the disclosure of certain medical information by electronic transmission. I acknowledge that I have been given the opportunity to request an in-person assessment or other available alternative prior to the telemedicine visit and am voluntarily participating in the telemedicine visit.  I understand that I have the right to withhold or withdraw my consent to the use of telemedicine in the course of my care at any time, without affecting my right to future care or treatment, and that the Practitioner or I may terminate the telemedicine visit at any time. I understand that I have the right to inspect all information obtained and/or recorded in the course of the telemedicine visit and may receive copies of available information for a reasonable fee.  I understand that some of the potential risks of receiving the Services via telemedicine include:  Marland Kitchen Delay or interruption in medical evaluation due to technological equipment failure or disruption; . Information transmitted may not be sufficient (e.g. poor resolution of images) to allow for appropriate medical decision making by the Practitioner;  and/or  . In rare instances, security protocols could fail, causing a breach of personal health information.  Furthermore, I acknowledge that it is my responsibility to provide information about my medical history, conditions and care that is complete and accurate to the best of my ability. I acknowledge that Practitioner's advice, recommendations, and/or decision may be based on factors not within their control, such as incomplete or inaccurate data provided by me or distortions of diagnostic images or specimens that may result from electronic transmissions. I understand that the practice of medicine is not an exact science and that Practitioner makes no warranties or guarantees regarding treatment outcomes. I acknowledge that a copy of this consent can be made available to me via my patient portal [facility] MyChart), or I can request a printed copy by calling the office of [facility].    I understand that my insurance will be billed for this visit.   I have read or had this consent read to me. . I understand the contents of this consent, which adequately explains the benefits and risks of the Services being provided via telemedicine.  . I have been provided ample opportunity to ask questions regarding this consent and the Services and have had my questions answered to my satisfaction. . I give my informed consent for the services to be provided through the use of telemedicine in my medical care

## 2019-08-15 ENCOUNTER — Telehealth (INDEPENDENT_AMBULATORY_CARE_PROVIDER_SITE_OTHER): Payer: Medicare Other | Admitting: Cardiology

## 2019-08-15 ENCOUNTER — Encounter: Payer: Self-pay | Admitting: Cardiology

## 2019-08-15 VITALS — Ht 69.0 in

## 2019-08-15 DIAGNOSIS — I6521 Occlusion and stenosis of right carotid artery: Secondary | ICD-10-CM

## 2019-08-15 DIAGNOSIS — E782 Mixed hyperlipidemia: Secondary | ICD-10-CM | POA: Diagnosis not present

## 2019-08-15 DIAGNOSIS — I25119 Atherosclerotic heart disease of native coronary artery with unspecified angina pectoris: Secondary | ICD-10-CM

## 2019-08-15 DIAGNOSIS — I10 Essential (primary) hypertension: Secondary | ICD-10-CM

## 2019-08-15 NOTE — Patient Instructions (Signed)
Medication Instructions:  Your physician recommends that you continue on your current medications as directed. Please refer to the Current Medication list given to you today.  *If you need a refill on your cardiac medications before your next appointment, please call your pharmacy*   Lab Work: None today If you have labs (blood work) drawn today and your tests are completely normal, you will receive your results only by: . MyChart Message (if you have MyChart) OR . A paper copy in the mail If you have any lab test that is abnormal or we need to change your treatment, we will call you to review the results.   Testing/Procedures: None today   Follow-Up: At [facility], you and your health needs are our priority.  As part of our continuing mission to provide you with exceptional heart care, we have created designated Provider Care Teams.  These Care Teams include your primary Cardiologist (physician) and Advanced Practice Providers (APPs -  Physician Assistants and Nurse Practitioners) who all work together to provide you with the care you need, when you need it.  We recommend signing up for the patient portal called "MyChart".  Sign up information is provided on this After Visit Summary.  MyChart is used to connect with patients for Virtual Visits (Telemedicine).  Patients are able to view lab/test results, encounter notes, upcoming appointments, etc.  Non-urgent messages can be sent to your provider as well.   To learn more about what you can do with MyChart, go to [email or web address].    Your next appointment:   12 month(s)  The format for your next appointment:   In Person  Provider:   Samuel McDowell, MD   Other Instructions None       Thank you for choosing [facility] !         

## 2019-08-29 DIAGNOSIS — E1129 Type 2 diabetes mellitus with other diabetic kidney complication: Secondary | ICD-10-CM | POA: Diagnosis not present

## 2019-08-29 DIAGNOSIS — E1165 Type 2 diabetes mellitus with hyperglycemia: Secondary | ICD-10-CM | POA: Diagnosis not present

## 2019-08-29 DIAGNOSIS — D72829 Elevated white blood cell count, unspecified: Secondary | ICD-10-CM | POA: Diagnosis not present

## 2019-08-29 DIAGNOSIS — E1122 Type 2 diabetes mellitus with diabetic chronic kidney disease: Secondary | ICD-10-CM | POA: Diagnosis not present

## 2019-09-06 DIAGNOSIS — N183 Chronic kidney disease, stage 3 unspecified: Secondary | ICD-10-CM | POA: Diagnosis not present

## 2019-09-06 DIAGNOSIS — R739 Hyperglycemia, unspecified: Secondary | ICD-10-CM | POA: Diagnosis not present

## 2019-09-06 DIAGNOSIS — E782 Mixed hyperlipidemia: Secondary | ICD-10-CM | POA: Diagnosis not present

## 2019-09-06 DIAGNOSIS — I129 Hypertensive chronic kidney disease with stage 1 through stage 4 chronic kidney disease, or unspecified chronic kidney disease: Secondary | ICD-10-CM | POA: Diagnosis not present

## 2019-09-06 DIAGNOSIS — E1129 Type 2 diabetes mellitus with other diabetic kidney complication: Secondary | ICD-10-CM | POA: Diagnosis not present

## 2019-09-06 DIAGNOSIS — Z0001 Encounter for general adult medical examination with abnormal findings: Secondary | ICD-10-CM | POA: Diagnosis not present

## 2019-09-06 DIAGNOSIS — E1122 Type 2 diabetes mellitus with diabetic chronic kidney disease: Secondary | ICD-10-CM | POA: Diagnosis not present

## 2019-09-06 DIAGNOSIS — E1165 Type 2 diabetes mellitus with hyperglycemia: Secondary | ICD-10-CM | POA: Diagnosis not present

## 2019-09-06 DIAGNOSIS — D72829 Elevated white blood cell count, unspecified: Secondary | ICD-10-CM | POA: Diagnosis not present

## 2019-10-02 DIAGNOSIS — I129 Hypertensive chronic kidney disease with stage 1 through stage 4 chronic kidney disease, or unspecified chronic kidney disease: Secondary | ICD-10-CM | POA: Diagnosis not present

## 2019-10-02 DIAGNOSIS — N189 Chronic kidney disease, unspecified: Secondary | ICD-10-CM | POA: Diagnosis not present

## 2019-10-02 DIAGNOSIS — E1122 Type 2 diabetes mellitus with diabetic chronic kidney disease: Secondary | ICD-10-CM | POA: Diagnosis not present

## 2019-10-02 DIAGNOSIS — E782 Mixed hyperlipidemia: Secondary | ICD-10-CM | POA: Diagnosis not present

## 2019-10-11 DIAGNOSIS — E1122 Type 2 diabetes mellitus with diabetic chronic kidney disease: Secondary | ICD-10-CM | POA: Diagnosis not present

## 2019-10-11 DIAGNOSIS — N189 Chronic kidney disease, unspecified: Secondary | ICD-10-CM | POA: Diagnosis not present

## 2019-10-11 DIAGNOSIS — E782 Mixed hyperlipidemia: Secondary | ICD-10-CM | POA: Diagnosis not present

## 2019-10-11 DIAGNOSIS — I129 Hypertensive chronic kidney disease with stage 1 through stage 4 chronic kidney disease, or unspecified chronic kidney disease: Secondary | ICD-10-CM | POA: Diagnosis not present

## 2019-10-24 ENCOUNTER — Emergency Department (HOSPITAL_COMMUNITY): Payer: Medicare Other

## 2019-10-24 ENCOUNTER — Encounter (HOSPITAL_COMMUNITY): Payer: Self-pay | Admitting: *Deleted

## 2019-10-24 ENCOUNTER — Emergency Department (HOSPITAL_COMMUNITY)
Admission: EM | Admit: 2019-10-24 | Discharge: 2019-10-25 | Disposition: A | Discharge status: Home / Self Care | Payer: Medicare Other | Attending: Emergency Medicine | Admitting: Emergency Medicine

## 2019-10-24 ENCOUNTER — Other Ambulatory Visit: Payer: Self-pay

## 2019-10-24 DIAGNOSIS — I10 Essential (primary) hypertension: Secondary | ICD-10-CM | POA: Diagnosis not present

## 2019-10-24 DIAGNOSIS — Z794 Long term (current) use of insulin: Secondary | ICD-10-CM | POA: Diagnosis not present

## 2019-10-24 DIAGNOSIS — Z87891 Personal history of nicotine dependence: Secondary | ICD-10-CM | POA: Insufficient documentation

## 2019-10-24 DIAGNOSIS — R0789 Other chest pain: Secondary | ICD-10-CM | POA: Diagnosis not present

## 2019-10-24 DIAGNOSIS — Z7982 Long term (current) use of aspirin: Secondary | ICD-10-CM | POA: Diagnosis not present

## 2019-10-24 DIAGNOSIS — Z888 Allergy status to other drugs, medicaments and biological substances status: Secondary | ICD-10-CM | POA: Diagnosis not present

## 2019-10-24 DIAGNOSIS — Z955 Presence of coronary angioplasty implant and graft: Secondary | ICD-10-CM | POA: Insufficient documentation

## 2019-10-24 DIAGNOSIS — Z8673 Personal history of transient ischemic attack (TIA), and cerebral infarction without residual deficits: Secondary | ICD-10-CM | POA: Insufficient documentation

## 2019-10-24 DIAGNOSIS — Z79899 Other long term (current) drug therapy: Secondary | ICD-10-CM | POA: Insufficient documentation

## 2019-10-24 DIAGNOSIS — R072 Precordial pain: Secondary | ICD-10-CM | POA: Diagnosis present

## 2019-10-24 DIAGNOSIS — R079 Chest pain, unspecified: Secondary | ICD-10-CM | POA: Diagnosis not present

## 2019-10-24 DIAGNOSIS — Z7951 Long term (current) use of inhaled steroids: Secondary | ICD-10-CM | POA: Insufficient documentation

## 2019-10-24 DIAGNOSIS — G459 Transient cerebral ischemic attack, unspecified: Secondary | ICD-10-CM | POA: Diagnosis not present

## 2019-10-24 DIAGNOSIS — I2510 Atherosclerotic heart disease of native coronary artery without angina pectoris: Secondary | ICD-10-CM | POA: Diagnosis not present

## 2019-10-24 DIAGNOSIS — I4510 Unspecified right bundle-branch block: Secondary | ICD-10-CM | POA: Diagnosis not present

## 2019-10-24 DIAGNOSIS — E119 Type 2 diabetes mellitus without complications: Secondary | ICD-10-CM | POA: Insufficient documentation

## 2019-10-24 DIAGNOSIS — J90 Pleural effusion, not elsewhere classified: Secondary | ICD-10-CM | POA: Diagnosis not present

## 2019-10-24 DIAGNOSIS — I517 Cardiomegaly: Secondary | ICD-10-CM | POA: Diagnosis not present

## 2019-10-24 LAB — COMPREHENSIVE METABOLIC PANEL
ALT: 26 U/L (ref 0–44)
AST: 18 U/L (ref 15–41)
Albumin: 3.9 g/dL (ref 3.5–5.0)
Alkaline Phosphatase: 68 U/L (ref 38–126)
Anion gap: 13 (ref 5–15)
BUN: 18 mg/dL (ref 8–23)
CO2: 21 mmol/L — ABNORMAL LOW (ref 22–32)
Calcium: 9.2 mg/dL (ref 8.9–10.3)
Chloride: 100 mmol/L (ref 98–111)
Creatinine, Ser: 1.22 mg/dL (ref 0.61–1.24)
GFR calc Af Amer: >60 mL/min (ref >60)
GFR calc non Af Amer: 60 mL/min — ABNORMAL LOW (ref >60)
Glucose, Bld: 275 mg/dL — ABNORMAL HIGH (ref 70–99)
Potassium: 4.2 mmol/L (ref 3.5–5.1)
Sodium: 134 mmol/L — ABNORMAL LOW (ref 135–145)
Total Bilirubin: 0.6 mg/dL (ref 0.3–1.2)
Total Protein: 8.1 g/dL (ref 6.5–8.1)

## 2019-10-24 LAB — CBC WITH DIFFERENTIAL/PLATELET
Abs Immature Granulocytes: 0.07 10*3/uL (ref 0.00–0.07)
Basophils Absolute: 0.1 10*3/uL (ref 0.0–0.1)
Basophils Relative: 1 %
Eosinophils Absolute: 0.2 10*3/uL (ref 0.0–0.5)
Eosinophils Relative: 1 %
HCT: 41.4 % (ref 39.0–52.0)
Hemoglobin: 13.8 g/dL (ref 13.0–17.0)
Immature Granulocytes: 1 %
Lymphocytes Relative: 20 %
Lymphs Abs: 2.3 10*3/uL (ref 0.7–4.0)
MCH: 28.6 pg (ref 26.0–34.0)
MCHC: 33.3 g/dL (ref 30.0–36.0)
MCV: 85.9 fL (ref 80.0–100.0)
Monocytes Absolute: 1 10*3/uL (ref 0.1–1.0)
Monocytes Relative: 9 %
Neutro Abs: 7.8 10*3/uL — ABNORMAL HIGH (ref 1.7–7.7)
Neutrophils Relative %: 68 %
Platelets: 144 10*3/uL — ABNORMAL LOW (ref 150–400)
RBC: 4.82 MIL/uL (ref 4.22–5.81)
RDW: 14.3 % (ref 11.5–15.5)
WBC: 11.4 10*3/uL — ABNORMAL HIGH (ref 4.0–10.5)
nRBC: 0 % (ref 0.0–0.2)

## 2019-10-24 LAB — TROPONIN I (HIGH SENSITIVITY): Troponin I (High Sensitivity): 16 ng/L (ref <18)

## 2019-10-24 MED ORDER — HYDROCODONE-ACETAMINOPHEN 5-325 MG PO TABS
1.0000 | ORAL_TABLET | Freq: Once | ORAL | Status: AC
  Administered 2019-10-24: 1 via ORAL
  Filled 2019-10-24: qty 1

## 2019-10-24 MED ORDER — HYDROCODONE-ACETAMINOPHEN 5-325 MG PO TABS: 1.0000 | ORAL_TABLET | Freq: Four times a day (QID) | ORAL | 0 refills | Status: AC | PRN

## 2019-10-24 NOTE — ED Provider Notes (Signed)
[facility] EMERGENCY DEPARTMENT Provider Note   CSN: [id number] Arrival date & time: 10/24/19  1949     History Chief Complaint  Patient presents with  . Chest Pain    Shaun Pugh. is a 69 y.o. male.  Patient complains of pain over his sternum.  Patient's wife states that they have decreased his Celexa and since then he has been having some pain.   No shortness of breath no sweating  The history is provided by the patient and medical records.  Chest Pain Pain location:  Substernal area Pain quality: aching   Pain radiates to:  Does not radiate Pain severity:  Moderate Onset quality:  Sudden Timing:  Constant Chronicity:  New Relieved by:  Nothing Worsened by:  Nothing Associated symptoms: no abdominal pain, no back pain, no cough, no fatigue and no headache   Risk factors: no aortic disease        Past Medical History:  Diagnosis Date  . Acute pancreatitis 09/14/2011  . Atrophy of right kidney 09/15/2011  . CAD (coronary artery disease)    DES to mid LAD and distal RCA 2005 - Dr. Amil Amen  . Carotid artery disease (HCC)   . Carpal tunnel syndrome, bilateral   . Chronic back pain   . Essential hypertension   . GERD (gastroesophageal reflux disease)   . History of gallstones   . History of kidney stones   . History of stroke 03/18/2012   Aortic atheroma implicated, was on Coumadin for a period of time and more recently aspirin and Plavix  . History of stroke 03/2016   Left occipital lobe PCA infarct, residual partial loss of visual fields  . History of stroke 11/2011   Large right MCA, residual left hemiparesis  . History of toe fracture   . Hydroureteronephrosis 05/20/2013   Mod to severe right, mild perinephric stranding secondary to 10x7 mm calculus distal right ureter  . Hyperlipidemia   . Iliac artery aneurysm (HCC) 09/14/2011   Right common, nonaneurysmal dilatation of the infrrenal aorta, noted on CT abd/pelvis  . Meibomian gland dysfunction   .  Myocardial infarction (HCC)   . Pneumonia 03/2012  . Retinal artery branch occlusion 11/2011   Secondary to thromboembolic disease, lost vision in Left eye  . Seizures (HCC)    Following stroke  . Senile cataracts of both eyes   . Sleep apnea    Reportedly intolerant of CPAP  . Type 2 diabetes mellitus (HCC)   . Wears dentures     Patient Active Problem List   Diagnosis Date Noted  . S/P carotid endarterectomy 11/04/2016  . Symptomatic carotid artery stenosis, right 09/04/2016  . History of stroke 06/24/2016  . 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 Stenosis of right carotid artery 06/17/2016  . Blurry vision, bilateral   . CVA (cerebral vascular accident) (HCC) 03/13/2016  . Diabetes mellitus (HCC) 03/13/2016  . Essential hypertension 03/13/2016  . Other hyperlipidemia 03/13/2016  . Muscle weakness (generalized) 05/31/2012  . Difficulty walking 05/31/2012  . Lack of coordination 05/31/2012  . Cytotoxic cerebral edema (HCC) 03/19/2012  . Aortic embolism or thrombosis (HCC) 03/19/2012  . Cerebrovascular accident (CVA) due to embolism of left posterior cerebral artery (HCC) 03/18/2012  . Seizure (HCC) 03/18/2012  . Stented coronary artery   . Hemiplegia affecting nondominant side (HCC) 03/17/2012  . Atrophy of right kidney 09/15/2011  . Kidney calculi 09/15/2011  . Acute pancreatitis 09/14/2011  . Cholelithiasis 09/14/2011  . Hyperglycemia 09/14/2011  Past Surgical History:  Procedure Laterality Date  . BACK SURGERY     LUMBAR SURGERIES x 4 - INCLUDING FUSIONS  . CARPAL TUNNEL REPAIR Left   . CHOLECYSTECTOMY  09/25/2011   Procedure: LAPAROSCOPIC CHOLECYSTECTOMY;  Surgeon: Fabio Bering, MD;  Location: [facility];  Service: General;  Laterality: N/A;  . COLONOSCOPY    . CORONARY ANGIOPLASTY  2005   Heart stents  . CYSTOSCOPY WITH RETROGRADE PYELOGRAM, URETEROSCOPY AND STENT PLACEMENT Right 05/26/2013   Procedure: CYSTOSCOPY, RIGHT RETROGRADE PYELOGRAM, RIGHT  URETEROSCOPY AND RIGHT STENT PLACEMENT, stone extraction, biopsy;  Surgeon: Crist Fat, MD;  Location: [facility];  Service: Urology;  Laterality: Right;  . ENDARTERECTOMY Right 09/04/2016   Procedure: ENDARTERECTOMY CAROTID;  Surgeon: Nada Libman, MD;  Location[facility];  Service: Vascular;  Laterality: Right;  . HERNIA REPAIR     INGUINAL HERNIA REPAIR MAYBE 30 YRS AGO  . MINOR NAILBED REPAIR Left 01/20/2018   Procedure: NAILBED DEBRIDEMENT AND REPAIR;  Surgeon: Bradly Bienenstock, MD;  Location: [facility];  Service: Orthopedics;  Laterality: Left;  Marland Kitchen MULTIPLE TOOTH EXTRACTIONS         Family History  Problem Relation Age of Onset  . Congestive Heart Failure Mother 67  . Diabetes Father 49  . Heart attack Father   . Anesthesia problems Neg Hx   . Hypotension Neg Hx   . Malignant hyperthermia Neg Hx   . Pseudochol deficiency Neg Hx     Social History   Tobacco Use  . Smoking status: Former Smoker    Packs/day: 1.00    Years: 45.00    Pack years: 45.00    Types: Cigarettes    Quit date: 03/18/2012    Years since quitting: 7.6  . Smokeless tobacco: Never Used  Vaping Use  . Vaping Use: Never used  Substance Use Topics  . Alcohol use: No  . Drug use: No    Home Medications Prior to Admission medications   Medication Sig Start Date End Date Taking? Authorizing Provider  aspirin EC 325 MG tablet Take 1 tablet (325 mg total) by mouth daily. 06/24/16   Marvel Plan, MD  calcium-vitamin D (OSCAL WITH D) 500-200 MG-UNIT tablet Take 1 tablet by mouth daily with breakfast.    [provider]  clobetasol cream (TEMOVATE) 0.05 % Apply 1 application topically as needed.     [provider]  clopidogrel (PLAVIX) 75 MG tablet TAKE 1 TABLET BY MOUTH ONCE DAILY 06/16/17   Marvel Plan, MD  escitalopram (LEXAPRO) 10 MG tablet Take 10 mg by mouth daily.  08/15/17   [provider]  fluticasone (CUTIVATE) 0.05 % cream Apply 1 application topically as  needed.     [provider]  glipiZIDE (GLUCOTROL XL) 10 MG 24 hr tablet Take 10 mg by mouth daily with breakfast.    [provider]  HYDROcodone-acetaminophen (NORCO/VICODIN) 5-325 MG tablet Take 1 tablet by mouth every 6 (six) hours as needed. 10/24/19   Bethann Berkshire, MD  insulin glargine (LANTUS) 100 UNIT/ML injection Inject 60 Units into the skin at bedtime.    [provider]  lisinopril-hydrochlorothiazide (PRINZIDE,ZESTORETIC) 10-12.5 MG tablet Take 0.5 tablets by mouth daily.  02/18/16   [provider]  metFORMIN (GLUCOPHAGE) 500 MG tablet Take 500 mg by mouth 2 (two) times daily with a meal.    [provider]  pantoprazole (PROTONIX) 40 MG tablet Take 1 tablet by mouth daily. 03/11/16   [provider]  vitamin B-12 (CYANOCOBALAMIN) 1000 MCG tablet Take 1,000 mcg by mouth daily.    [provider]    Allergies    Atorvastatin calcium, Crestor [rosuvastatin calcium], Lipitor [atorvastatin], and Other  Review of Systems   Review of Systems  Constitutional: Negative for appetite change and fatigue.  HENT: Negative for congestion, ear discharge and sinus pressure.   Eyes: Negative for discharge.  Respiratory: Negative for cough.   Cardiovascular: Positive for chest pain.  Gastrointestinal: Negative for abdominal pain and diarrhea.  Genitourinary: Negative for frequency and hematuria.  Musculoskeletal: Negative for back pain.  Skin: Negative for rash.  Neurological: Negative for seizures and headaches.  Psychiatric/Behavioral: Negative for hallucinations.    Physical Exam Updated Vital Signs BP 110/71   Pulse 78   Temp 98.1 F (36.7 C) (Oral)   Resp 19   Ht 5\' 11"  (1.803 m)   Wt (!) 161.9 kg   SpO2 96%   BMI 49.79 kg/m   Physical Exam Vitals and nursing note reviewed.  Constitutional:      Appearance: He is well-developed.  HENT:     Head: Normocephalic.     Nose: Nose normal.  Eyes:     General: No  scleral icterus.    Conjunctiva/sclera: Conjunctivae normal.  Neck:     Thyroid: No thyromegaly.  Cardiovascular:     Rate and Rhythm: Normal rate and regular rhythm.     Heart sounds: No murmur heard.  No friction rub. No gallop.   Pulmonary:     Breath sounds: No stridor. No wheezing or rales.  Chest:     Chest wall: No tenderness.  Abdominal:     General: There is no distension.     Tenderness: There is no abdominal tenderness. There is no rebound.  Musculoskeletal:        General: Normal range of motion.     Cervical back: Neck supple.  Lymphadenopathy:     Cervical: No cervical adenopathy.  Skin:    Findings: No erythema or rash.  Neurological:     Mental Status: He is alert and oriented to person, place, and time.     Motor: No abnormal muscle tone.     Coordination: Coordination normal.  Psychiatric:        Behavior: Behavior normal.     ED Results / Procedures / Treatments   Labs (all labs ordered are listed, but only abnormal results are displayed) Labs Reviewed  CBC WITH DIFFERENTIAL/PLATELET - Abnormal; Notable for the following components:      Result Value   WBC 11.4 (*)    Platelets 144 (*)    Neutro Abs 7.8 (*)    All other components within normal limits  COMPREHENSIVE METABOLIC PANEL - Abnormal; Notable for the following components:   Sodium 134 (*)    CO2 21 (*)    Glucose, Bld 275 (*)    GFR calc non Af Amer 60 (*)    All other components within normal limits  TROPONIN I (HIGH SENSITIVITY)  TROPONIN I (HIGH SENSITIVITY)    EKG EKG Interpretation  Date/Time:  Tuesday October 24 2019 19:54:46 EDT Ventricular Rate:  80 PR Interval:    QRS Duration: 146 QT Interval:  406 QTC Calculation: 469 R Axis:   100 Text Interpretation: Atrial fibrillation RBBB and LPFB Confirmed by Milton Ferguson [phone]) on 10/24/2019 8:16:27 PM   Radiology DG Chest Port 1 View  Result Date: 10/24/2019 CLINICAL DATA:  Chest pain EXAM: PORTABLE CHEST 1 VIEW  COMPARISON:  10/29/2016 FINDINGS: Borderline to mild cardiomegaly. No focal opacity or pleural effusion. No pneumothorax. IMPRESSION: No active disease.  Borderline to mild cardiomegaly Electronically Signed   By: Jasmine Pang M.D.   On: 10/24/2019 21:40    Procedures Procedures (including critical care time)  Medications Ordered in ED Medications  HYDROcodone-acetaminophen (NORCO/VICODIN) 5-325 MG per tablet 1 tablet (1 tablet Oral Given 10/24/19 2123)    ED Course  I have reviewed the triage vital signs and the nursing notes.  Pertinent labs & imaging results that were available during my care of the patient were reviewed by me and considered in my medical decision making (see chart for details).    MDM Rules/Calculators/A&P                          Patient with noncardiac chest pain.  He will be discharged home with some pain medicine follow-up with his PCP      This patient presents to the ED for concern of chest pain, this involves an extensive number of treatment options, and is a complaint that carries with it a high risk of complications and morbidity.  The differential diagnosis includes MI pneumothorax PE   Lab Tests:   I Ordered, reviewed, and interpreted labs, which included CBC chemistries which showed glucose elevated and mild elevated white count  Medicines ordered:   I ordered medication Vicodin for pain  Imaging Studies ordered:   I ordered imaging studies which included chest x-ray and  I independently visualized and interpreted imaging which showed unremarkable  Additional history obtained:   Additional history obtained from wife  Previous records obtained and reviewed   Consultations Obtained:   s  Reevaluation:  After the interventions stated above, I reevaluated the patient and found improved  Critical Interventions:  .   Final Clinical Impression(s) / ED Diagnoses Final diagnoses:  Atypical chest pain    Rx / DC Orders ED  Discharge Orders         Ordered    HYDROcodone-acetaminophen (NORCO/VICODIN) 5-325 MG tablet  Every 6 hours PRN     Discontinue  Reprint     10/24/19 2346           Bethann Berkshire, MD 10/25/19 1021

## 2019-10-24 NOTE — Discharge Instructions (Addendum)
Follow-up with your family doctor next week for recheck.  Try Tylenol first for pain if that does not help then use Vicodin

## 2019-10-24 NOTE — ED Triage Notes (Signed)
Presents via EMS for chest pain that started at 1530 today took asparin at home. Ems reports initial b/p 200/100 and chest pain 1/10 when they arrived. Given NTG by ems and b/p later at 140/80.

## 2019-10-25 LAB — TROPONIN I (HIGH SENSITIVITY): Troponin I (High Sensitivity): 21 ng/L — ABNORMAL HIGH (ref <18)

## 2019-10-27 DIAGNOSIS — R079 Chest pain, unspecified: Secondary | ICD-10-CM | POA: Diagnosis not present

## 2019-10-27 DIAGNOSIS — R451 Restlessness and agitation: Secondary | ICD-10-CM | POA: Diagnosis not present

## 2019-11-13 DIAGNOSIS — F333 Major depressive disorder, recurrent, severe with psychotic symptoms: Secondary | ICD-10-CM | POA: Diagnosis not present

## 2019-11-13 DIAGNOSIS — R451 Restlessness and agitation: Secondary | ICD-10-CM | POA: Diagnosis not present

## 2019-11-15 DIAGNOSIS — E782 Mixed hyperlipidemia: Secondary | ICD-10-CM | POA: Diagnosis not present

## 2019-11-15 DIAGNOSIS — E1122 Type 2 diabetes mellitus with diabetic chronic kidney disease: Secondary | ICD-10-CM | POA: Diagnosis not present

## 2019-11-15 DIAGNOSIS — N189 Chronic kidney disease, unspecified: Secondary | ICD-10-CM | POA: Diagnosis not present

## 2019-11-15 DIAGNOSIS — I129 Hypertensive chronic kidney disease with stage 1 through stage 4 chronic kidney disease, or unspecified chronic kidney disease: Secondary | ICD-10-CM | POA: Diagnosis not present

## 2019-12-11 DIAGNOSIS — N189 Chronic kidney disease, unspecified: Secondary | ICD-10-CM | POA: Diagnosis not present

## 2019-12-11 DIAGNOSIS — E1122 Type 2 diabetes mellitus with diabetic chronic kidney disease: Secondary | ICD-10-CM | POA: Diagnosis not present

## 2019-12-11 DIAGNOSIS — I129 Hypertensive chronic kidney disease with stage 1 through stage 4 chronic kidney disease, or unspecified chronic kidney disease: Secondary | ICD-10-CM | POA: Diagnosis not present

## 2019-12-11 DIAGNOSIS — E782 Mixed hyperlipidemia: Secondary | ICD-10-CM | POA: Diagnosis not present

## 2020-01-09 DIAGNOSIS — I1310 Hypertensive heart and chronic kidney disease without heart failure, with stage 1 through stage 4 chronic kidney disease, or unspecified chronic kidney disease: Secondary | ICD-10-CM | POA: Diagnosis not present

## 2020-01-09 DIAGNOSIS — D72829 Elevated white blood cell count, unspecified: Secondary | ICD-10-CM | POA: Diagnosis not present

## 2020-01-09 DIAGNOSIS — E669 Obesity, unspecified: Secondary | ICD-10-CM | POA: Diagnosis not present

## 2020-01-09 DIAGNOSIS — I69354 Hemiplegia and hemiparesis following cerebral infarction affecting left non-dominant side: Secondary | ICD-10-CM | POA: Diagnosis not present

## 2020-01-11 DIAGNOSIS — E782 Mixed hyperlipidemia: Secondary | ICD-10-CM | POA: Diagnosis not present

## 2020-01-11 DIAGNOSIS — E1122 Type 2 diabetes mellitus with diabetic chronic kidney disease: Secondary | ICD-10-CM | POA: Diagnosis not present

## 2020-01-11 DIAGNOSIS — N189 Chronic kidney disease, unspecified: Secondary | ICD-10-CM | POA: Diagnosis not present

## 2020-01-11 DIAGNOSIS — I129 Hypertensive chronic kidney disease with stage 1 through stage 4 chronic kidney disease, or unspecified chronic kidney disease: Secondary | ICD-10-CM | POA: Diagnosis not present

## 2020-01-16 DIAGNOSIS — I129 Hypertensive chronic kidney disease with stage 1 through stage 4 chronic kidney disease, or unspecified chronic kidney disease: Secondary | ICD-10-CM | POA: Diagnosis not present

## 2020-01-16 DIAGNOSIS — E782 Mixed hyperlipidemia: Secondary | ICD-10-CM | POA: Diagnosis not present

## 2020-01-16 DIAGNOSIS — E1129 Type 2 diabetes mellitus with other diabetic kidney complication: Secondary | ICD-10-CM | POA: Diagnosis not present

## 2020-01-16 DIAGNOSIS — N183 Chronic kidney disease, stage 3 unspecified: Secondary | ICD-10-CM | POA: Diagnosis not present

## 2020-02-27 DIAGNOSIS — I129 Hypertensive chronic kidney disease with stage 1 through stage 4 chronic kidney disease, or unspecified chronic kidney disease: Secondary | ICD-10-CM | POA: Diagnosis not present

## 2020-02-27 DIAGNOSIS — E782 Mixed hyperlipidemia: Secondary | ICD-10-CM | POA: Diagnosis not present

## 2020-02-27 DIAGNOSIS — E1122 Type 2 diabetes mellitus with diabetic chronic kidney disease: Secondary | ICD-10-CM | POA: Diagnosis not present

## 2020-02-27 DIAGNOSIS — N189 Chronic kidney disease, unspecified: Secondary | ICD-10-CM | POA: Diagnosis not present

## 2020-03-29 DIAGNOSIS — E782 Mixed hyperlipidemia: Secondary | ICD-10-CM | POA: Diagnosis not present

## 2020-03-29 DIAGNOSIS — N189 Chronic kidney disease, unspecified: Secondary | ICD-10-CM | POA: Diagnosis not present

## 2020-03-29 DIAGNOSIS — I129 Hypertensive chronic kidney disease with stage 1 through stage 4 chronic kidney disease, or unspecified chronic kidney disease: Secondary | ICD-10-CM | POA: Diagnosis not present

## 2020-03-29 DIAGNOSIS — E1122 Type 2 diabetes mellitus with diabetic chronic kidney disease: Secondary | ICD-10-CM | POA: Diagnosis not present

## 2020-05-10 DIAGNOSIS — E782 Mixed hyperlipidemia: Secondary | ICD-10-CM | POA: Diagnosis not present

## 2020-05-10 DIAGNOSIS — I129 Hypertensive chronic kidney disease with stage 1 through stage 4 chronic kidney disease, or unspecified chronic kidney disease: Secondary | ICD-10-CM | POA: Diagnosis not present

## 2020-05-10 DIAGNOSIS — E1122 Type 2 diabetes mellitus with diabetic chronic kidney disease: Secondary | ICD-10-CM | POA: Diagnosis not present

## 2020-05-10 DIAGNOSIS — R739 Hyperglycemia, unspecified: Secondary | ICD-10-CM | POA: Diagnosis not present

## 2020-06-08 DIAGNOSIS — I129 Hypertensive chronic kidney disease with stage 1 through stage 4 chronic kidney disease, or unspecified chronic kidney disease: Secondary | ICD-10-CM | POA: Diagnosis not present

## 2020-06-08 DIAGNOSIS — R739 Hyperglycemia, unspecified: Secondary | ICD-10-CM | POA: Diagnosis not present

## 2020-06-08 DIAGNOSIS — E782 Mixed hyperlipidemia: Secondary | ICD-10-CM | POA: Diagnosis not present

## 2020-06-08 DIAGNOSIS — E1122 Type 2 diabetes mellitus with diabetic chronic kidney disease: Secondary | ICD-10-CM | POA: Diagnosis not present

## 2020-07-08 DIAGNOSIS — I129 Hypertensive chronic kidney disease with stage 1 through stage 4 chronic kidney disease, or unspecified chronic kidney disease: Secondary | ICD-10-CM | POA: Diagnosis not present

## 2020-07-08 DIAGNOSIS — E782 Mixed hyperlipidemia: Secondary | ICD-10-CM | POA: Diagnosis not present

## 2020-07-08 DIAGNOSIS — G720 Drug-induced myopathy: Secondary | ICD-10-CM | POA: Diagnosis not present

## 2020-07-08 DIAGNOSIS — E1122 Type 2 diabetes mellitus with diabetic chronic kidney disease: Secondary | ICD-10-CM | POA: Diagnosis not present

## 2020-07-30 DIAGNOSIS — E669 Obesity, unspecified: Secondary | ICD-10-CM | POA: Diagnosis not present

## 2020-07-30 DIAGNOSIS — I69354 Hemiplegia and hemiparesis following cerebral infarction affecting left non-dominant side: Secondary | ICD-10-CM | POA: Diagnosis not present

## 2020-07-30 DIAGNOSIS — I1310 Hypertensive heart and chronic kidney disease without heart failure, with stage 1 through stage 4 chronic kidney disease, or unspecified chronic kidney disease: Secondary | ICD-10-CM | POA: Diagnosis not present

## 2020-07-30 DIAGNOSIS — D72829 Elevated white blood cell count, unspecified: Secondary | ICD-10-CM | POA: Diagnosis not present

## 2020-08-01 DIAGNOSIS — R739 Hyperglycemia, unspecified: Secondary | ICD-10-CM | POA: Diagnosis not present

## 2020-08-01 DIAGNOSIS — I129 Hypertensive chronic kidney disease with stage 1 through stage 4 chronic kidney disease, or unspecified chronic kidney disease: Secondary | ICD-10-CM | POA: Diagnosis not present

## 2020-08-01 DIAGNOSIS — R300 Dysuria: Secondary | ICD-10-CM | POA: Diagnosis not present

## 2020-08-01 DIAGNOSIS — E1122 Type 2 diabetes mellitus with diabetic chronic kidney disease: Secondary | ICD-10-CM | POA: Diagnosis not present

## 2020-09-05 DIAGNOSIS — R296 Repeated falls: Secondary | ICD-10-CM | POA: Diagnosis not present

## 2020-09-05 DIAGNOSIS — F0631 Mood disorder due to known physiological condition with depressive features: Secondary | ICD-10-CM | POA: Diagnosis not present

## 2020-09-05 DIAGNOSIS — Z993 Dependence on wheelchair: Secondary | ICD-10-CM | POA: Diagnosis not present

## 2020-09-17 ENCOUNTER — Emergency Department (HOSPITAL_COMMUNITY): Payer: Medicare Other

## 2020-09-17 ENCOUNTER — Other Ambulatory Visit: Payer: Self-pay

## 2020-09-17 ENCOUNTER — Encounter (HOSPITAL_COMMUNITY): Payer: Self-pay | Admitting: Emergency Medicine

## 2020-09-17 DIAGNOSIS — R296 Repeated falls: Secondary | ICD-10-CM | POA: Diagnosis present

## 2020-09-17 DIAGNOSIS — E7849 Other hyperlipidemia: Secondary | ICD-10-CM | POA: Diagnosis present

## 2020-09-17 DIAGNOSIS — E1159 Type 2 diabetes mellitus with other circulatory complications: Secondary | ICD-10-CM | POA: Diagnosis not present

## 2020-09-17 DIAGNOSIS — I4510 Unspecified right bundle-branch block: Secondary | ICD-10-CM | POA: Diagnosis present

## 2020-09-17 DIAGNOSIS — G8929 Other chronic pain: Secondary | ICD-10-CM | POA: Diagnosis present

## 2020-09-17 DIAGNOSIS — Z6841 Body Mass Index (BMI) 40.0 and over, adult: Secondary | ICD-10-CM | POA: Diagnosis not present

## 2020-09-17 DIAGNOSIS — R339 Retention of urine, unspecified: Secondary | ICD-10-CM | POA: Diagnosis not present

## 2020-09-17 DIAGNOSIS — R570 Cardiogenic shock: Secondary | ICD-10-CM | POA: Diagnosis not present

## 2020-09-17 DIAGNOSIS — R0600 Dyspnea, unspecified: Secondary | ICD-10-CM

## 2020-09-17 DIAGNOSIS — I214 Non-ST elevation (NSTEMI) myocardial infarction: Principal | ICD-10-CM

## 2020-09-17 DIAGNOSIS — I5021 Acute systolic (congestive) heart failure: Secondary | ICD-10-CM | POA: Diagnosis present

## 2020-09-17 DIAGNOSIS — R451 Restlessness and agitation: Secondary | ICD-10-CM | POA: Diagnosis present

## 2020-09-17 DIAGNOSIS — K219 Gastro-esophageal reflux disease without esophagitis: Secondary | ICD-10-CM | POA: Diagnosis present

## 2020-09-17 DIAGNOSIS — E119 Type 2 diabetes mellitus without complications: Secondary | ICD-10-CM

## 2020-09-17 DIAGNOSIS — J811 Chronic pulmonary edema: Secondary | ICD-10-CM | POA: Diagnosis not present

## 2020-09-17 DIAGNOSIS — Z87891 Personal history of nicotine dependence: Secondary | ICD-10-CM

## 2020-09-17 DIAGNOSIS — N3000 Acute cystitis without hematuria: Secondary | ICD-10-CM

## 2020-09-17 DIAGNOSIS — Z7982 Long term (current) use of aspirin: Secondary | ICD-10-CM

## 2020-09-17 DIAGNOSIS — Z87442 Personal history of urinary calculi: Secondary | ICD-10-CM

## 2020-09-17 DIAGNOSIS — I700 Atherosclerosis of aorta: Secondary | ICD-10-CM | POA: Diagnosis present

## 2020-09-17 DIAGNOSIS — I252 Old myocardial infarction: Secondary | ICD-10-CM

## 2020-09-17 DIAGNOSIS — Z8673 Personal history of transient ischemic attack (TIA), and cerebral infarction without residual deficits: Secondary | ICD-10-CM | POA: Diagnosis not present

## 2020-09-17 DIAGNOSIS — Z8249 Family history of ischemic heart disease and other diseases of the circulatory system: Secondary | ICD-10-CM

## 2020-09-17 DIAGNOSIS — N261 Atrophy of kidney (terminal): Secondary | ICD-10-CM | POA: Diagnosis present

## 2020-09-17 DIAGNOSIS — I959 Hypotension, unspecified: Secondary | ICD-10-CM | POA: Diagnosis not present

## 2020-09-17 DIAGNOSIS — Z66 Do not resuscitate: Secondary | ICD-10-CM | POA: Diagnosis present

## 2020-09-17 DIAGNOSIS — W19XXXA Unspecified fall, initial encounter: Secondary | ICD-10-CM | POA: Diagnosis present

## 2020-09-17 DIAGNOSIS — I110 Hypertensive heart disease with heart failure: Secondary | ICD-10-CM | POA: Diagnosis present

## 2020-09-17 DIAGNOSIS — Z794 Long term (current) use of insulin: Secondary | ICD-10-CM | POA: Diagnosis not present

## 2020-09-17 DIAGNOSIS — I63432 Cerebral infarction due to embolism of left posterior cerebral artery: Secondary | ICD-10-CM | POA: Diagnosis present

## 2020-09-17 DIAGNOSIS — N3001 Acute cystitis with hematuria: Secondary | ICD-10-CM | POA: Diagnosis present

## 2020-09-17 DIAGNOSIS — N179 Acute kidney failure, unspecified: Secondary | ICD-10-CM | POA: Diagnosis present

## 2020-09-17 DIAGNOSIS — Z833 Family history of diabetes mellitus: Secondary | ICD-10-CM

## 2020-09-17 DIAGNOSIS — I25110 Atherosclerotic heart disease of native coronary artery with unstable angina pectoris: Secondary | ICD-10-CM | POA: Diagnosis present

## 2020-09-17 DIAGNOSIS — L89302 Pressure ulcer of unspecified buttock, stage 2: Secondary | ICD-10-CM | POA: Diagnosis present

## 2020-09-17 DIAGNOSIS — Z7902 Long term (current) use of antithrombotics/antiplatelets: Secondary | ICD-10-CM

## 2020-09-17 DIAGNOSIS — Z20822 Contact with and (suspected) exposure to covid-19: Secondary | ICD-10-CM | POA: Diagnosis present

## 2020-09-17 DIAGNOSIS — R4182 Altered mental status, unspecified: Secondary | ICD-10-CM

## 2020-09-17 DIAGNOSIS — R32 Unspecified urinary incontinence: Secondary | ICD-10-CM | POA: Diagnosis present

## 2020-09-17 DIAGNOSIS — Z79890 Hormone replacement therapy (postmenopausal): Secondary | ICD-10-CM

## 2020-09-17 DIAGNOSIS — M25519 Pain in unspecified shoulder: Secondary | ICD-10-CM | POA: Diagnosis not present

## 2020-09-17 DIAGNOSIS — Z7401 Bed confinement status: Secondary | ICD-10-CM

## 2020-09-17 DIAGNOSIS — J9601 Acute respiratory failure with hypoxia: Secondary | ICD-10-CM | POA: Diagnosis present

## 2020-09-17 DIAGNOSIS — I6521 Occlusion and stenosis of right carotid artery: Secondary | ICD-10-CM | POA: Diagnosis present

## 2020-09-17 DIAGNOSIS — E872 Acidosis: Secondary | ICD-10-CM | POA: Diagnosis present

## 2020-09-17 DIAGNOSIS — Z79899 Other long term (current) drug therapy: Secondary | ICD-10-CM

## 2020-09-17 DIAGNOSIS — G4730 Sleep apnea, unspecified: Secondary | ICD-10-CM | POA: Diagnosis present

## 2020-09-17 DIAGNOSIS — L8990 Pressure ulcer of unspecified site, unspecified stage: Secondary | ICD-10-CM | POA: Insufficient documentation

## 2020-09-17 DIAGNOSIS — I69354 Hemiplegia and hemiparesis following cerebral infarction affecting left non-dominant side: Secondary | ICD-10-CM

## 2020-09-17 DIAGNOSIS — I10 Essential (primary) hypertension: Secondary | ICD-10-CM | POA: Diagnosis present

## 2020-09-17 DIAGNOSIS — R21 Rash and other nonspecific skin eruption: Secondary | ICD-10-CM | POA: Diagnosis not present

## 2020-09-17 DIAGNOSIS — M25511 Pain in right shoulder: Secondary | ICD-10-CM | POA: Diagnosis not present

## 2020-09-17 DIAGNOSIS — Z9049 Acquired absence of other specified parts of digestive tract: Secondary | ICD-10-CM

## 2020-09-17 DIAGNOSIS — R001 Bradycardia, unspecified: Secondary | ICD-10-CM | POA: Diagnosis not present

## 2020-09-17 DIAGNOSIS — R7989 Other specified abnormal findings of blood chemistry: Secondary | ICD-10-CM

## 2020-09-17 DIAGNOSIS — R0602 Shortness of breath: Secondary | ICD-10-CM | POA: Diagnosis not present

## 2020-09-17 DIAGNOSIS — R778 Other specified abnormalities of plasma proteins: Secondary | ICD-10-CM | POA: Diagnosis not present

## 2020-09-17 DIAGNOSIS — I517 Cardiomegaly: Secondary | ICD-10-CM | POA: Diagnosis not present

## 2020-09-17 DIAGNOSIS — F0150 Vascular dementia without behavioral disturbance: Secondary | ICD-10-CM | POA: Diagnosis present

## 2020-09-17 DIAGNOSIS — J90 Pleural effusion, not elsewhere classified: Secondary | ICD-10-CM | POA: Diagnosis not present

## 2020-09-17 DIAGNOSIS — M19032 Primary osteoarthritis, left wrist: Secondary | ICD-10-CM | POA: Diagnosis present

## 2020-09-17 DIAGNOSIS — Z955 Presence of coronary angioplasty implant and graft: Secondary | ICD-10-CM

## 2020-09-17 DIAGNOSIS — Z888 Allergy status to other drugs, medicaments and biological substances status: Secondary | ICD-10-CM

## 2020-09-17 DIAGNOSIS — M19012 Primary osteoarthritis, left shoulder: Secondary | ICD-10-CM | POA: Diagnosis not present

## 2020-09-17 LAB — COMPREHENSIVE METABOLIC PANEL
ALT: 21 U/L (ref 0–44)
AST: 38 U/L (ref 15–41)
Albumin: 4.3 g/dL (ref 3.5–5.0)
Alkaline Phosphatase: 62 U/L (ref 38–126)
Anion gap: 12 (ref 5–15)
BUN: 21 mg/dL (ref 8–23)
CO2: 20 mmol/L — ABNORMAL LOW (ref 22–32)
Calcium: 9.3 mg/dL (ref 8.9–10.3)
Chloride: 104 mmol/L (ref 98–111)
Creatinine, Ser: 1.22 mg/dL (ref 0.61–1.24)
GFR, Estimated: >60 mL/min (ref >60)
Glucose, Bld: 188 mg/dL — ABNORMAL HIGH (ref 70–99)
Potassium: 4.3 mmol/L (ref 3.5–5.1)
Sodium: 136 mmol/L (ref 135–145)
Total Bilirubin: 0.9 mg/dL (ref 0.3–1.2)
Total Protein: 8.8 g/dL — ABNORMAL HIGH (ref 6.5–8.1)

## 2020-09-17 LAB — BLOOD GAS, ARTERIAL
Acid-base deficit: 5.6 mmol/L — ABNORMAL HIGH (ref 0.0–2.0)
Bicarbonate: 19.9 mmol/L — ABNORMAL LOW (ref 20.0–28.0)
FIO2: 50
O2 Saturation: 97.8 %
Patient temperature: 37
pCO2 arterial: 36.3 mmHg (ref 32.0–48.0)
pH, Arterial: 7.341 — ABNORMAL LOW (ref 7.350–7.450)
pO2, Arterial: 118 mmHg — ABNORMAL HIGH (ref 83.0–108.0)

## 2020-09-17 LAB — CBC WITH DIFFERENTIAL/PLATELET
Abs Immature Granulocytes: 0.15 10*3/uL — ABNORMAL HIGH (ref 0.00–0.07)
Basophils Absolute: 0.1 10*3/uL (ref 0.0–0.1)
Basophils Relative: 1 %
Eosinophils Absolute: 0.2 10*3/uL (ref 0.0–0.5)
Eosinophils Relative: 1 %
HCT: 45.9 % (ref 39.0–52.0)
Hemoglobin: 14.3 g/dL (ref 13.0–17.0)
Immature Granulocytes: 1 %
Lymphocytes Relative: 19 %
Lymphs Abs: 4 10*3/uL (ref 0.7–4.0)
MCH: 28.4 pg (ref 26.0–34.0)
MCHC: 31.2 g/dL (ref 30.0–36.0)
MCV: 91.3 fL (ref 80.0–100.0)
Monocytes Absolute: 2 10*3/uL — ABNORMAL HIGH (ref 0.1–1.0)
Monocytes Relative: 10 %
Neutro Abs: 14.5 10*3/uL — ABNORMAL HIGH (ref 1.7–7.7)
Neutrophils Relative %: 68 %
Platelets: 232 10*3/uL (ref 150–400)
RBC: 5.03 MIL/uL (ref 4.22–5.81)
RDW: 16.4 % — ABNORMAL HIGH (ref 11.5–15.5)
WBC: 20.9 10*3/uL — ABNORMAL HIGH (ref 4.0–10.5)
nRBC: 0 % (ref 0.0–0.2)

## 2020-09-17 LAB — URINALYSIS, ROUTINE W REFLEX MICROSCOPIC
Bilirubin Urine: NEGATIVE
Glucose, UA: 50 mg/dL — AB
Ketones, ur: NEGATIVE mg/dL
Nitrite: POSITIVE — AB
Protein, ur: 30 mg/dL — AB
Specific Gravity, Urine: 1.025 (ref 1.005–1.030)
pH: 5 (ref 5.0–8.0)

## 2020-09-17 LAB — GLUCOSE, CAPILLARY
Glucose-Capillary: 221 mg/dL — ABNORMAL HIGH (ref 70–99)
Glucose-Capillary: 234 mg/dL — ABNORMAL HIGH (ref 70–99)

## 2020-09-17 LAB — RESP PANEL BY RT-PCR (FLU A&B, COVID) ARPGX2
Influenza A by PCR: NEGATIVE
Influenza B by PCR: NEGATIVE
SARS Coronavirus 2 by RT PCR: NEGATIVE

## 2020-09-17 LAB — TROPONIN I (HIGH SENSITIVITY)
Troponin I (High Sensitivity): 1795 ng/L (ref <18)
Troponin I (High Sensitivity): 2006 ng/L (ref <18)

## 2020-09-17 LAB — LACTIC ACID, PLASMA
Lactic Acid, Venous: 2.5 mmol/L (ref 0.5–1.9)
Lactic Acid, Venous: 1.8 mmol/L (ref 0.5–1.9)

## 2020-09-17 LAB — BRAIN NATRIURETIC PEPTIDE: B Natriuretic Peptide: 731 pg/mL — ABNORMAL HIGH (ref 0.0–100.0)

## 2020-09-17 LAB — CBG MONITORING, ED: Glucose-Capillary: 215 mg/dL — ABNORMAL HIGH (ref 70–99)

## 2020-09-17 MED ORDER — ONDANSETRON HCL 4 MG/2ML IJ SOLN
4.0000 mg | Freq: Once | INTRAMUSCULAR | Status: AC
  Administered 2020-09-17: 4 mg via INTRAVENOUS
  Filled 2020-09-17: qty 2

## 2020-09-17 MED ORDER — LISINOPRIL 10 MG PO TABS
10.0000 mg | ORAL_TABLET | Freq: Every day | ORAL | Status: DC
  Administered 2020-09-17: 10 mg via ORAL
  Filled 2020-09-17: qty 1

## 2020-09-17 MED ORDER — INSULIN ASPART 100 UNIT/ML IJ SOLN
0.0000 [IU] | Freq: Every day | INTRAMUSCULAR | Status: DC
  Administered 2020-09-17: 2 [IU] via SUBCUTANEOUS

## 2020-09-17 MED ORDER — SODIUM CHLORIDE 0.9 % IV SOLN: 1.0000 g | Freq: Once | INTRAVENOUS | Status: DC

## 2020-09-17 MED ORDER — HEPARIN BOLUS VIA INFUSION
4000.0000 [IU] | Freq: Once | INTRAVENOUS | Status: AC
  Administered 2020-09-17: 4000 [IU] via INTRAVENOUS

## 2020-09-17 MED ORDER — CHLORHEXIDINE GLUCONATE CLOTH 2 % EX PADS
6.0000 | MEDICATED_PAD | Freq: Every day | CUTANEOUS | Status: DC
  Administered 2020-09-17 – 2020-09-20 (×4): 6 via TOPICAL

## 2020-09-17 MED ORDER — INSULIN GLARGINE 100 UNIT/ML ~~LOC~~ SOLN
30.0000 [IU] | Freq: Every day | SUBCUTANEOUS | Status: DC
  Administered 2020-09-18 – 2020-09-20 (×3): 30 [IU] via SUBCUTANEOUS
  Filled 2020-09-17 (×6): qty 0.3

## 2020-09-17 MED ORDER — ASPIRIN EC 325 MG PO TBEC
325.0000 mg | DELAYED_RELEASE_TABLET | Freq: Every day | ORAL | Status: DC
  Administered 2020-09-17 – 2020-09-20 (×4): 325 mg via ORAL
  Filled 2020-09-17 (×4): qty 1

## 2020-09-17 MED ORDER — FUROSEMIDE 10 MG/ML IJ SOLN
40.0000 mg | Freq: Once | INTRAMUSCULAR | Status: AC
  Administered 2020-09-17: 40 mg via INTRAVENOUS
  Filled 2020-09-17: qty 4

## 2020-09-17 MED ORDER — CLOPIDOGREL BISULFATE 75 MG PO TABS
75.0000 mg | ORAL_TABLET | Freq: Every day | ORAL | Status: DC
  Administered 2020-09-17 – 2020-09-20 (×4): 75 mg via ORAL
  Filled 2020-09-17 (×4): qty 1

## 2020-09-17 MED ORDER — ONDANSETRON HCL 4 MG/2ML IJ SOLN
INTRAMUSCULAR | Status: AC
  Administered 2020-09-17: 4 mg
  Filled 2020-09-17: qty 2

## 2020-09-17 MED ORDER — MORPHINE SULFATE (PF) 4 MG/ML IV SOLN
4.0000 mg | Freq: Once | INTRAVENOUS | Status: AC
  Administered 2020-09-17: 4 mg via INTRAVENOUS
  Filled 2020-09-17: qty 1

## 2020-09-17 MED ORDER — ONDANSETRON HCL 4 MG/2ML IJ SOLN
4.0000 mg | Freq: Four times a day (QID) | INTRAMUSCULAR | Status: DC | PRN
  Administered 2020-09-17 – 2020-09-19 (×4): 4 mg via INTRAVENOUS
  Filled 2020-09-17 (×4): qty 2

## 2020-09-17 MED ORDER — ACETAMINOPHEN 325 MG PO TABS
650.0000 mg | ORAL_TABLET | Freq: Four times a day (QID) | ORAL | Status: DC | PRN
  Administered 2020-09-17: 650 mg via ORAL
  Filled 2020-09-17: qty 2

## 2020-09-17 MED ORDER — SODIUM CHLORIDE 0.9 % IV SOLN
1.0000 g | INTRAVENOUS | Status: DC
  Administered 2020-09-17 – 2020-09-19 (×3): 1 g via INTRAVENOUS
  Filled 2020-09-17 (×3): qty 10

## 2020-09-17 MED ORDER — SENNA 8.6 MG PO TABS
1.0000 | ORAL_TABLET | Freq: Two times a day (BID) | ORAL | Status: DC
  Administered 2020-09-17 – 2020-09-20 (×6): 8.6 mg via ORAL
  Filled 2020-09-17 (×6): qty 1

## 2020-09-17 MED ORDER — PANTOPRAZOLE SODIUM 40 MG PO TBEC
40.0000 mg | DELAYED_RELEASE_TABLET | Freq: Every day | ORAL | Status: DC
  Administered 2020-09-17 – 2020-09-20 (×4): 40 mg via ORAL
  Filled 2020-09-17 (×4): qty 1

## 2020-09-17 MED ORDER — VITAMIN B-12 1000 MCG PO TABS
1000.0000 ug | ORAL_TABLET | Freq: Every day | ORAL | Status: DC
  Administered 2020-09-17 – 2020-09-20 (×4): 1000 ug via ORAL
  Filled 2020-09-17 (×4): qty 1

## 2020-09-17 MED ORDER — OXYCODONE HCL 5 MG PO TABS
5.0000 mg | ORAL_TABLET | ORAL | Status: DC | PRN
Start: 2020-09-17 — End: 2020-09-19
  Administered 2020-09-17 – 2020-09-18 (×3): 5 mg via ORAL
  Filled 2020-09-17 (×3): qty 1

## 2020-09-17 MED ORDER — HEPARIN (PORCINE) 25000 UT/250ML-% IV SOLN
1900.0000 [IU]/h | INTRAVENOUS | Status: AC
  Administered 2020-09-17: 1350 [IU]/h via INTRAVENOUS
  Administered 2020-09-18: 1700 [IU]/h via INTRAVENOUS
  Administered 2020-09-19: 1900 [IU]/h via INTRAVENOUS
  Filled 2020-09-17 (×4): qty 250

## 2020-09-17 MED ORDER — INSULIN ASPART 100 UNIT/ML IJ SOLN
0.0000 [IU] | Freq: Three times a day (TID) | INTRAMUSCULAR | Status: DC
  Administered 2020-09-18: 3 [IU] via SUBCUTANEOUS
  Administered 2020-09-18 – 2020-09-19 (×3): 5 [IU] via SUBCUTANEOUS
  Administered 2020-09-19: 3 [IU] via SUBCUTANEOUS
  Administered 2020-09-19: 5 [IU] via SUBCUTANEOUS
  Administered 2020-09-20: 3 [IU] via SUBCUTANEOUS

## 2020-09-17 MED ORDER — ACETAMINOPHEN 650 MG RE SUPP: 650.0000 mg | Freq: Four times a day (QID) | RECTAL | Status: DC | PRN

## 2020-09-17 NOTE — ED Notes (Signed)
Lab tech at bedside

## 2020-09-17 NOTE — Consult Note (Signed)
Cardiology Consultation:   Patient ID: Shaun Astersoy Tuminello Jr. MRN: [id number]; DOB: 05/30/1950  Admit date: 07-03-2020 Date of Consult: 07-03-2020  PCP:  Benita StabileHall, John Z, MD   [facility] Providers Cardiologist:  Nona DellSamuel McDowell, MD        Patient Profile:   Shaun AstersRoy Abercrombie Jr. is a 70 y.o. male with a hx of ED status post DES to the mid LAD and distal RCA 2005 who is being seen 07-03-2020 for the evaluation of pulmonary edema while lying flat during CT scan and positive troponins at the request of Dr. Marland McalpineSheikh.  History of Present Illness:   Shaun Pugh is a 70 year old male patient with history of CAD status post DES to mid LAD and distal RCA 2005 managed with Plavix and aspirin.  Myoview in 2018 mild apical ischemia mild to moderate basal inferior ischemia.  He has had multiple CVAs with left hemiparesis, status post right carotid endarterectomy 2018.  He is cared for by his wife.  Also has hyperlipidemia.  Has not been seen in our office since 2019.  Patient fell yesterday while trying to transfer from bed to the wheelchair and missed the wheelchair.  He came in because of multiple aches and pains from the fall.  While in the CT scan lying flat he became hot all over complaining of left arm pain and acutely short of breath.  He responded to BiPAP and Lasix.  In speaking with him he elicits chest pressure off and on for the past several days.  He says he took Tylenol for it and does not have nitroglycerin at home.  He prefers conservative management and no cardiac catheterization at this time.   Past Medical History:  Diagnosis Date  . Acute pancreatitis 09/14/2011  . Atrophy of right kidney 09/15/2011  . CAD (coronary artery disease)    DES to mid LAD and distal RCA 2005 - Dr. Amil AmenEdmunds  . Carotid artery disease (HCC)   . Carpal tunnel syndrome, bilateral   . Chronic back pain   . Essential hypertension   . GERD (gastroesophageal reflux disease)   . History of gallstones   . History of kidney  stones   . History of stroke 03/18/2012   Aortic atheroma implicated, was on Coumadin for a period of time and more recently aspirin and Plavix  . History of stroke 03/2016   Left occipital lobe PCA infarct, residual partial loss of visual fields  . History of stroke 11/2011   Large right MCA, residual left hemiparesis  . History of toe fracture   . Hydroureteronephrosis 05/20/2013   Mod to severe right, mild perinephric stranding secondary to 10x7 mm calculus distal right ureter  . Hyperlipidemia   . Iliac artery aneurysm (HCC) 09/14/2011   Right common, nonaneurysmal dilatation of the infrrenal aorta, noted on CT abd/pelvis  . Meibomian gland dysfunction   . Myocardial infarction (HCC)   . Pneumonia 03/2012  . Retinal artery branch occlusion 11/2011   Secondary to thromboembolic disease, lost vision in Left eye  . Seizures (HCC)    Following stroke  . Senile cataracts of both eyes   . Sleep apnea    Reportedly intolerant of CPAP  . Type 2 diabetes mellitus (HCC)   . Wears dentures     Past Surgical History:  Procedure Laterality Date  . BACK SURGERY     LUMBAR SURGERIES x 4 - INCLUDING FUSIONS  . CARPAL TUNNEL REPAIR Left   . CHOLECYSTECTOMY  09/25/2011   Procedure: LAPAROSCOPIC CHOLECYSTECTOMY;  Surgeon: Fabio Bering, MD;  Location: [facility];  Service: General;  Laterality: N/A;  . COLONOSCOPY    . CORONARY ANGIOPLASTY  2005   Heart stents  . CYSTOSCOPY WITH RETROGRADE PYELOGRAM, URETEROSCOPY AND STENT PLACEMENT Right 05/26/2013   Procedure: CYSTOSCOPY, RIGHT RETROGRADE PYELOGRAM, RIGHT URETEROSCOPY AND RIGHT STENT PLACEMENT, stone extraction, biopsy;  Surgeon: Crist Fat, MD;  Location: [facility];  Service: Urology;  Laterality: Right;  . ENDARTERECTOMY Right 09/04/2016   Procedure: ENDARTERECTOMY CAROTID;  Surgeon: Nada Libman, MD;  Location: [facility] OR;  Service: Vascular;  Laterality: Right;  . HERNIA REPAIR     INGUINAL HERNIA REPAIR MAYBE 30 YRS AGO  . MINOR  NAILBED REPAIR Left 01/20/2018   Procedure: NAILBED DEBRIDEMENT AND REPAIR;  Surgeon: Bradly Bienenstock, MD;  Location: [facility];  Service: Orthopedics;  Laterality: Left;  Marland Kitchen MULTIPLE TOOTH EXTRACTIONS       Home Medications:  Prior to Admission medications   Medication Sig Start Date End Date Taking? Authorizing Provider  aspirin EC 325 MG tablet Take 1 tablet (325 mg total) by mouth daily. 06/24/16   Marvel Plan, MD  calcium-vitamin D (OSCAL WITH D) 500-200 MG-UNIT tablet Take 1 tablet by mouth daily with breakfast.    [provider]  clobetasol cream (TEMOVATE) 0.05 % Apply 1 application topically as needed.     [provider]  clopidogrel (PLAVIX) 75 MG tablet TAKE 1 TABLET BY MOUTH ONCE DAILY 06/16/17   Marvel Plan, MD  escitalopram (LEXAPRO) 10 MG tablet Take 10 mg by mouth daily.  08/15/17   [provider]  fluticasone (CUTIVATE) 0.05 % cream Apply 1 application topically as needed.     [provider]  glipiZIDE (GLUCOTROL XL) 10 MG 24 hr tablet Take 10 mg by mouth daily with breakfast.    [provider]  HYDROcodone-acetaminophen (NORCO/VICODIN) 5-325 MG tablet Take 1 tablet by mouth every 6 (six) hours as needed. 10/24/19   Bethann Berkshire, MD  insulin glargine (LANTUS) 100 UNIT/ML injection Inject 60 Units into the skin at bedtime.    [provider]  lisinopril-hydrochlorothiazide (PRINZIDE,ZESTORETIC) 10-12.5 MG tablet Take 0.5 tablets by mouth daily.  02/18/16   [provider]  metFORMIN (GLUCOPHAGE) 500 MG tablet Take 500 mg by mouth 2 (two) times daily with a meal.    [provider]  pantoprazole (PROTONIX) 40 MG tablet Take 1 tablet by mouth daily. 03/11/16   [provider]  vitamin B-12 (CYANOCOBALAMIN) 1000 MCG tablet Take 1,000 mcg by mouth daily.    [provider]    Inpatient Medications: Scheduled Meds: . aspirin EC  325 mg Oral Daily  . clopidogrel  75 mg Oral Daily   . pantoprazole  40 mg Oral Daily  . vitamin B-12  1,000 mcg Oral Daily   Continuous Infusions: . cefTRIAXone (ROCEPHIN)  IV     PRN Meds:   Allergies:    Allergies  Allergen Reactions  . Atorvastatin Calcium   . Crestor [Rosuvastatin Calcium]     Joint pain and weakness   . Lipitor [Atorvastatin]     Joint pain and weakness  . Other     Social History:   Social History   Socioeconomic History  . Marital status: Married    Spouse name: Not on file  . Number of children: Not on file  . Years of education: Not on file  . Highest education level: Not on file  Occupational History  . Not  on file  Tobacco Use  . Smoking status: Former Smoker    Packs/day: 1.00    Years: 45.00    Pack years: 45.00    Types: Cigarettes    Quit date: 03/18/2012    Years since quitting: 8.5  . Smokeless tobacco: Never Used  Vaping Use  . Vaping Use: Never used  Substance and Sexual Activity  . Alcohol use: No  . Drug use: No  . Sexual activity: Yes    Birth control/protection: None  Other Topics Concern  . Not on file  Social History Narrative  . Not on file   Social Determinants of Health   Financial Resource Strain: Not on file  Food Insecurity: Not on file  Transportation Needs: Not on file  Physical Activity: Not on file  Stress: Not on file  Social Connections: Not on file  Intimate Partner Violence: Not on file    Family History:    Family History  Problem Relation Age of Onset  . Congestive Heart Failure Mother 68  . Diabetes Father 29  . Heart attack Father   . Anesthesia problems Neg Hx   . Hypotension Neg Hx   . Malignant hyperthermia Neg Hx   . Pseudochol deficiency Neg Hx      ROS:  Please see the history of present illness.  Review of Systems  Constitutional: Positive for diaphoresis.  HENT: Negative.   Cardiovascular: Positive for chest pain.  Respiratory: Positive for shortness of breath and wheezing.   Endocrine: Negative.    Hematologic/Lymphatic: Negative.   Musculoskeletal: Positive for joint pain.  Gastrointestinal: Negative.   Genitourinary: Positive for bladder incontinence.  Neurological: Positive for loss of balance.       Left hemiparesis from prior stroke   All other ROS reviewed and negative.     Physical Exam/Data:   Vitals:   09/29/2020 1224 10/05/2020 1300 10/03/2020 1400 10/02/2020 1415  BP: 92/72 105/76 118/83 113/79  Pulse: 94 (!) 102 (!) 102 (!) 102  Resp: (!) 24 (!) 30 18 17   Temp:      TempSrc:      SpO2: 93% 96% 97% 98%  Weight:      Height:       No intake or output data in the 24 hours ending 10/08/2020 1536 Last 3 Weights 09/18/2020 10/03/2020 10/24/2019  Weight (lbs) 357 lb 357 lb 357 lb  Weight (kg) 161.934 kg 161.934 kg 161.934 kg     Body mass index is 52.72 kg/m.  General: Obese, on BiPAP in no acute distress HEENT: normal Lymph: no adenopathy Neck: no JVD Endocrine:  No thryomegaly Vascular: No carotid bruits; FA pulses 2+ bilaterally without bruits  Cardiac:  normal S1, S2; RRR; no murmur  Lungs: Decreased breath sounds clear to auscultation bilaterally, no wheezing, rhonchi or rales  Abd: soft, nontender, no hepatomegaly  Ext: no edema Musculoskeletal:  No deformities, BUE and BLE strength normal and equal Skin: warm and dry  Neuro: Left hemiparesis Psych:  Normal affect   EKG:  The EKG was personally reviewed and demonstrates:  NSR with RBBB diffuse ST depression and ST elevation avR  Telemetry normal sinus rhythm  Laboratory Data:  High Sensitivity Troponin:   Recent Labs  Lab 09/14/2020 1340  TROPONINIHS 1,795*     Chemistry Recent Labs  Lab 09/10/2020 1322  NA 136  K 4.3  CL 104  CO2 20*  GLUCOSE 188*  BUN 21  CREATININE 1.22  CALCIUM 9.3  GFRNONAA >60  ANIONGAP  12    Recent Labs  Lab 2020-10-08 1322  PROT 8.8*  ALBUMIN 4.3  AST 38  ALT 21  ALKPHOS 62  BILITOT 0.9   Hematology Recent Labs  Lab Oct 08, 2020 1322  WBC 20.9*  RBC 5.03  HGB  14.3  HCT 45.9  MCV 91.3  MCH 28.4  MCHC 31.2  RDW 16.4*  PLT 232   BNP Recent Labs  Lab 10-08-2020 1340  BNP 731.0*    DDimer No results for input(s): DDIMER in the last 168 hours.   Radiology/Studies:  DG Chest 1 View  Result Date: 2020/10/08 CLINICAL DATA:  Fall.  Bilateral shoulder pain. EXAM: CHEST  1 VIEW COMPARISON:  10/24/2019 FINDINGS: The cardio pericardial silhouette is enlarged. There is pulmonary vascular congestion without overt pulmonary edema. Bibasilar atelectasis or infiltrate noted, right greater than left. Bones are diffusely demineralized. Telemetry leads overlie the chest. IMPRESSION: 1. Enlargement of the cardiopericardial silhouette with pulmonary vascular congestion. 2. Bibasilar atelectasis or infiltrate, right greater than left. Electronically Signed   By: Kennith Center M.D.   On: 10-08-2020 13:46   DG Shoulder Right  Result Date: 10/08/20 CLINICAL DATA:  Right shoulder pain after fall. EXAM: RIGHT SHOULDER - 2+ VIEW COMPARISON:  None. FINDINGS: There is no evidence of fracture or dislocation. There is no evidence of arthropathy or other focal bone abnormality. Soft tissues are unremarkable. IMPRESSION: Negative. Electronically Signed   By: Obie Dredge M.D.   On: 2020/10/08 13:47   DG Wrist Complete Left  Result Date: 10-08-2020 CLINICAL DATA:  Left wrist pain.  No known injury. EXAM: LEFT WRIST - COMPLETE 3+ VIEW COMPARISON:  None. FINDINGS: There is no evidence of fracture or dislocation. Mild first CMC osteoarthritis noted. Soft tissues are unremarkable. IMPRESSION: Mild first CMC osteoarthritis.  Otherwise negative. Electronically Signed   By: Drusilla Kanner M.D.   On: 10-08-2020 13:47   DG Wrist Complete Right  Result Date: 10/08/20 CLINICAL DATA:  Right wrist pain.  No known injury. EXAM: RIGHT WRIST - COMPLETE 3+ VIEW COMPARISON:  None. FINDINGS: There is no evidence of fracture or dislocation. There is no evidence of arthropathy or other  focal bone abnormality. Soft tissues are unremarkable. IMPRESSION: Negative exam. Electronically Signed   By: Drusilla Kanner M.D.   On: 10-08-20 13:46   CT Head Wo Contrast  Result Date: 10-08-20 CLINICAL DATA:  Neck trauma (Age >= 65y); Mental status change, unknown cause, fell onto left side last night at home, denies head injury and loss of consciousness EXAM: CT HEAD WITHOUT CONTRAST CT CERVICAL SPINE WITHOUT CONTRAST TECHNIQUE: Multidetector CT imaging of the head and cervical spine was performed following the standard protocol without intravenous contrast. Multiplanar CT image reconstructions of the cervical spine were also generated. COMPARISON:  CT a head/neck 03/16/2016, MRI head report from 03/14/2016 FINDINGS: CT HEAD FINDINGS Brain: There is encephalomalacia and in the right frontal, right parietal, temporal, and occipital lobes compatible with old infarct. There is also encephalomalacia in the left occipital parietal lobe compatible with prior PCA infarct. Unchanged size of the ventricular system with exception of and ex vacuo dilation of the left posterior horn due to now chronic left PCA infarct. Unchanged small chronic infarcts in the cerebellum. There is no new loss of gray-white matter differentiation identified on noncontrast CT. Vascular: No hyperdense vessel or unexpected calcification. Skull: Normal. Negative for fracture or focal lesion. Sinuses/Orbits: There is a small left mastoid effusion. The paranasal sinuses are predominantly clear. The orbits are unremarkable.  Other: None CT CERVICAL SPINE FINDINGS Mildly motion degraded exam of the cervical spine. Alignment: Straightening of the normal cervical lordosis, likely due to patient positioning or discomfort. Skull base and vertebrae: There is no evidence of acute fracture. Soft tissues and spinal canal: No prevertebral fluid or swelling. No visible canal hematoma. Disc levels: There is degenerative disc disease at C3-C4 with small  posterior disc osteophyte complex. Upper chest: Negative Other: None IMPRESSION: Multifocal encephalomalacia in the cerebral and cerebellar hemispheres, right greater than left, compatible with old infarcts as seen on prior imaging in 2017. No acute intracranial hemorrhage or evidence of new large territory infarct. If there is concern for new acute infarct, with recommend MRI. No evidence of acute cervical spine fracture. Degenerative disc disease at C3-C4 with small posterior disc osteophyte complex. Electronically Signed   By: Caprice Renshaw   On: 10-03-20 13:34   CT Cervical Spine Wo Contrast  Result Date: 2020/10/03 CLINICAL DATA:  Neck trauma (Age >= 65y); Mental status change, unknown cause, fell onto left side last night at home, denies head injury and loss of consciousness EXAM: CT HEAD WITHOUT CONTRAST CT CERVICAL SPINE WITHOUT CONTRAST TECHNIQUE: Multidetector CT imaging of the head and cervical spine was performed following the standard protocol without intravenous contrast. Multiplanar CT image reconstructions of the cervical spine were also generated. COMPARISON:  CT a head/neck 03/16/2016, MRI head report from 03/14/2016 FINDINGS: CT HEAD FINDINGS Brain: There is encephalomalacia and in the right frontal, right parietal, temporal, and occipital lobes compatible with old infarct. There is also encephalomalacia in the left occipital parietal lobe compatible with prior PCA infarct. Unchanged size of the ventricular system with exception of and ex vacuo dilation of the left posterior horn due to now chronic left PCA infarct. Unchanged small chronic infarcts in the cerebellum. There is no new loss of gray-white matter differentiation identified on noncontrast CT. Vascular: No hyperdense vessel or unexpected calcification. Skull: Normal. Negative for fracture or focal lesion. Sinuses/Orbits: There is a small left mastoid effusion. The paranasal sinuses are predominantly clear. The orbits are  unremarkable. Other: None CT CERVICAL SPINE FINDINGS Mildly motion degraded exam of the cervical spine. Alignment: Straightening of the normal cervical lordosis, likely due to patient positioning or discomfort. Skull base and vertebrae: There is no evidence of acute fracture. Soft tissues and spinal canal: No prevertebral fluid or swelling. No visible canal hematoma. Disc levels: There is degenerative disc disease at C3-C4 with small posterior disc osteophyte complex. Upper chest: Negative Other: None IMPRESSION: Multifocal encephalomalacia in the cerebral and cerebellar hemispheres, right greater than left, compatible with old infarcts as seen on prior imaging in 2017. No acute intracranial hemorrhage or evidence of new large territory infarct. If there is concern for new acute infarct, with recommend MRI. No evidence of acute cervical spine fracture. Degenerative disc disease at C3-C4 with small posterior disc osteophyte complex. Electronically Signed   By: Caprice Renshaw   On: 10-03-20 13:34   DG Shoulder Left  Result Date: 10-03-20 CLINICAL DATA:  Left shoulder pain. EXAM: LEFT SHOULDER - 2+ VIEW COMPARISON:  None. FINDINGS: No acute fracture or dislocation identified. Bones are osteopenic. Mild degenerative disease of the glenohumeral joint and proximal humerus. No bony lesions or destruction. IMPRESSION: No acute findings.  Mild degenerative disease of the shoulder. Electronically Signed   By: Irish Lack M.D.   On: 2020/10/03 13:47     Assessment and Plan:   NSTEMI/Pulmonary edema while in CT scan laying flat responded  to bipap and lasix. He says he's had chest pressure for several days.BNP 731, Troponin 1795, EKG NSR with RBBB diffuse ST depression and ST elevation avR He's had 4-5 strokes and is bedridden and cared for by his wife. They prefer conservative management and not transfer to [facility].  We will begin IV heparin, continue Plavix and aspirin.  Check 2D echo. BP low so hold off on beta  blocker  CAD status post DES to the mid LAD and distal RCA 2005, Myoview 2018 mild apical ischemia mild to moderate basal inferior ischemia medical therapy recommended  Carotid disease status post right carotid endarterectomy 08/2016 with 4-5 strokes in the past now with left hemiparesis and mostly bedridden, occasionally gets up in a chair  Hyperlipidemia allergies to atorvastatin and rosuvastatin. Check panel in am   Risk Assessment/Risk Scores:     TIMI Risk Score for Unstable Angina or Non-ST Elevation MI:   The patient's TIMI risk score is  , which indicates a  % risk of all cause mortality, new or recurrent myocardial infarction or need for urgent revascularization in the next 14 days.  New York Heart Association (NYHA) Functional Class NYHA Class IV        For questions or updates, please contact [facility] Please consult [email or web address] for contact info under    Signed, Jacolyn Reedy, PA-C  2020-10-17 3:36 PM

## 2020-09-17 NOTE — Progress Notes (Signed)
ANTICOAGULATION CONSULT NOTE - Initial Consult  Pharmacy Consult for Heparin Indication: chest pain/ACS  Allergies  Allergen Reactions  . Atorvastatin Calcium   . Crestor [Rosuvastatin Calcium]     Joint pain and weakness   . Lipitor [Atorvastatin]     Joint pain and weakness  . Other     Patient Measurements: Height: 5\' 9"  (175.3 cm) Weight: (!) 161.9 kg (357 lb) IBW/kg (Calculated) : 70.7 HEPARIN DW (KG): 110.4  Vital Signs: Temp: 97.5 F (36.4 C) (05/10 1151) Temp Source: Oral (05/10 1151) BP: 113/79 (05/10 1415) Pulse Rate: 102 (05/10 1415)  Labs: Recent Labs    29-Sep-2020 1322 09/29/20 1340  HGB 14.3  --   HCT 45.9  --   PLT 232  --   CREATININE 1.22  --   TROPONINIHS  --  1,795*    Estimated Creatinine Clearance: 85.4 mL/min (by C-G formula based on SCr of 1.22 mg/dL).   Medical History: Past Medical History:  Diagnosis Date  . Acute pancreatitis 09/14/2011  . Atrophy of right kidney 09/15/2011  . CAD (coronary artery disease)    DES to mid LAD and distal RCA 2005 - Dr. 2006  . Carotid artery disease (HCC)   . Carpal tunnel syndrome, bilateral   . Chronic back pain   . Essential hypertension   . GERD (gastroesophageal reflux disease)   . History of gallstones   . History of kidney stones   . History of stroke 03/18/2012   Aortic atheroma implicated, was on Coumadin for a period of time and more recently aspirin and Plavix  . History of stroke 03/2016   Left occipital lobe PCA infarct, residual partial loss of visual fields  . History of stroke 11/2011   Large right MCA, residual left hemiparesis  . History of toe fracture   . Hydroureteronephrosis 05/20/2013   Mod to severe right, mild perinephric stranding secondary to 10x7 mm calculus distal right ureter  . Hyperlipidemia   . Iliac artery aneurysm (HCC) 09/14/2011   Right common, nonaneurysmal dilatation of the infrrenal aorta, noted on CT abd/pelvis  . Meibomian gland dysfunction   .  Myocardial infarction (HCC)   . Pneumonia 03/2012  . Retinal artery branch occlusion 11/2011   Secondary to thromboembolic disease, lost vision in Left eye  . Seizures (HCC)    Following stroke  . Senile cataracts of both eyes   . Sleep apnea    Reportedly intolerant of CPAP  . Type 2 diabetes mellitus (HCC)   . Wears dentures     Medications:  See med rec  Assessment: Pt presents to the ED today with right and left wrist pain from a fall.  Pt has a hx of multiple strokes and has baseline difficulty ambulating.  He said he fell backwards and hurt both wrists and shoulders.  Pt is on plavix, but denies hitting his head. Patient also having SOB and diahphoresis.   He says he's had chest pressure for several days.BNP 731, Troponin 1795, EKG NSR with RBBB diffuse ST depression and ST elevation avR . Patient with NSTEMI/Pulmonary edema He's had 4-5 strokes and is bedridden and cared for by his wife. Pharmacy asked to start heparin. Not on oral DOACS  Goal of Therapy:  Heparin level 0.3-0.7 units/ml Monitor platelets by anticoagulation protocol: Yes   Plan:  Give 4000 units bolus x 1 Start heparin infusion at 1350 units/hr Check anti-Xa level in ~8  hours and daily while on heparin Continue to monitor H&H and  platelets   Elder Cyphers, BS Loura Back, BCPS Clinical Pharmacist Pager [phone] 09/11/2020,3:42 PM

## 2020-09-17 NOTE — ED Triage Notes (Signed)
Pt fell last night.  Pt on blood thinner.  C/o both wrists pain (5/10) and right shoulder.  History of DM CBG 210.

## 2020-09-17 NOTE — ED Provider Notes (Signed)
[facility] EMERGENCY DEPARTMENT Provider Note   CSN: [id number] Arrival date & time: 11/30/2020  1144     History Chief Complaint  Patient presents with  . Fall    Shaun AstersRoy Sobecki Jr. is a 70 y.o. male.  Pt presents to the ED today with right and left wrist pain from a fall.  Pt has a hx of multiple strokes and has baseline difficulty ambulating.  He said he fell backwards and hurt both wrists and shoulders.  Pt is on plavix, but denies hitting his head.  His family told EMS they were weaning some of his meds (? Which ones) and he's not been acting right.  The pt is alert and oriented now.        Past Medical History:  Diagnosis Date  . Acute pancreatitis 09/14/2011  . Atrophy of right kidney 09/15/2011  . CAD (coronary artery disease)    DES to mid LAD and distal RCA 2005 - Dr. Amil AmenEdmunds  . Carotid artery disease (HCC)   . Carpal tunnel syndrome, bilateral   . Chronic back pain   . Essential hypertension   . GERD (gastroesophageal reflux disease)   . History of gallstones   . History of kidney stones   . History of stroke 03/18/2012   Aortic atheroma implicated, was on Coumadin for a period of time and more recently aspirin and Plavix  . History of stroke 03/2016   Left occipital lobe PCA infarct, residual partial loss of visual fields  . History of stroke 11/2011   Large right MCA, residual left hemiparesis  . History of toe fracture   . Hydroureteronephrosis 05/20/2013   Mod to severe right, mild perinephric stranding secondary to 10x7 mm calculus distal right ureter  . Hyperlipidemia   . Iliac artery aneurysm (HCC) 09/14/2011   Right common, nonaneurysmal dilatation of the infrrenal aorta, noted on CT abd/pelvis  . Meibomian gland dysfunction   . Myocardial infarction (HCC)   . Pneumonia 03/2012  . Retinal artery branch occlusion 11/2011   Secondary to thromboembolic disease, lost vision in Left eye  . Seizures (HCC)    Following stroke  . Senile cataracts of both eyes    . Sleep apnea    Reportedly intolerant of CPAP  . Type 2 diabetes mellitus (HCC)   . Wears dentures     Patient Active Problem List   Diagnosis Date Noted  . Acute respiratory failure with hypoxia (HCC) July 28, 2020  . S/P carotid endarterectomy 11/04/2016  . Symptomatic carotid artery stenosis, right 09/04/2016  . History of stroke 06/24/2016  . 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 Stenosis of right carotid artery 06/17/2016  . Blurry vision, bilateral   . CVA (cerebral vascular accident) (HCC) 03/13/2016  . Diabetes mellitus (HCC) 03/13/2016  . Essential hypertension 03/13/2016  . Other hyperlipidemia 03/13/2016  . Muscle weakness (generalized) 05/31/2012  . Difficulty walking 05/31/2012  . Lack of coordination 05/31/2012  . Cytotoxic cerebral edema (HCC) 03/19/2012  . Aortic embolism or thrombosis (HCC) 03/19/2012  . Cerebrovascular accident (CVA) due to embolism of left posterior cerebral artery (HCC) 03/18/2012  . Seizure (HCC) 03/18/2012  . Stented coronary artery   . Hemiplegia affecting nondominant side (HCC) 03/17/2012  . Atrophy of right kidney 09/15/2011  . Kidney calculi 09/15/2011  . Acute pancreatitis 09/14/2011  . Cholelithiasis 09/14/2011  . Hyperglycemia 09/14/2011    Past Surgical History:  Procedure Laterality Date  . BACK SURGERY     LUMBAR SURGERIES x 4 - INCLUDING  FUSIONS  . CARPAL TUNNEL REPAIR Left   . CHOLECYSTECTOMY  09/25/2011   Procedure: LAPAROSCOPIC CHOLECYSTECTOMY;  Surgeon: Fabio Bering, MD;  Location: [facility];  Service: General;  Laterality: N/A;  . COLONOSCOPY    . CORONARY ANGIOPLASTY  2005   Heart stents  . CYSTOSCOPY WITH RETROGRADE PYELOGRAM, URETEROSCOPY AND STENT PLACEMENT Right 05/26/2013   Procedure: CYSTOSCOPY, RIGHT RETROGRADE PYELOGRAM, RIGHT URETEROSCOPY AND RIGHT STENT PLACEMENT, stone extraction, biopsy;  Surgeon: Crist Fat, MD;  Location: [facility];  Service: Urology;  Laterality: Right;  .  ENDARTERECTOMY Right 09/04/2016   Procedure: ENDARTERECTOMY CAROTID;  Surgeon: Nada Libman, MD;  Location: [facility] OR;  Service: Vascular;  Laterality: Right;  . HERNIA REPAIR     INGUINAL HERNIA REPAIR MAYBE 30 YRS AGO  . MINOR NAILBED REPAIR Left 01/20/2018   Procedure: NAILBED DEBRIDEMENT AND REPAIR;  Surgeon: Bradly Bienenstock, MD;  Location: [facility];  Service: Orthopedics;  Laterality: Left;  Marland Kitchen MULTIPLE TOOTH EXTRACTIONS         Family History  Problem Relation Age of Onset  . Congestive Heart Failure Mother 57  . Diabetes Father 67  . Heart attack Father   . Anesthesia problems Neg Hx   . Hypotension Neg Hx   . Malignant hyperthermia Neg Hx   . Pseudochol deficiency Neg Hx     Social History   Tobacco Use  . Smoking status: Former Smoker    Packs/day: 1.00    Years: 45.00    Pack years: 45.00    Types: Cigarettes    Quit date: 03/18/2012    Years since quitting: 8.5  . Smokeless tobacco: Never Used  Vaping Use  . Vaping Use: Never used  Substance Use Topics  . Alcohol use: No  . Drug use: No    Home Medications Prior to Admission medications   Medication Sig Start Date End Date Taking? Authorizing Provider  aspirin EC 325 MG tablet Take 1 tablet (325 mg total) by mouth daily. 06/24/16   Marvel Plan, MD  calcium-vitamin D (OSCAL WITH D) 500-200 MG-UNIT tablet Take 1 tablet by mouth daily with breakfast.    [provider]  clobetasol cream (TEMOVATE) 0.05 % Apply 1 application topically as needed.     [provider]  clopidogrel (PLAVIX) 75 MG tablet TAKE 1 TABLET BY MOUTH ONCE DAILY 06/16/17   Marvel Plan, MD  escitalopram (LEXAPRO) 10 MG tablet Take 10 mg by mouth daily.  08/15/17   [provider]  fluticasone (CUTIVATE) 0.05 % cream Apply 1 application topically as needed.     [provider]  glipiZIDE (GLUCOTROL XL) 10 MG 24 hr tablet Take 10 mg by mouth daily with breakfast.    [provider]   HYDROcodone-acetaminophen (NORCO/VICODIN) 5-325 MG tablet Take 1 tablet by mouth every 6 (six) hours as needed. 10/24/19   Bethann Berkshire, MD  insulin glargine (LANTUS) 100 UNIT/ML injection Inject 60 Units into the skin at bedtime.    [provider]  lisinopril-hydrochlorothiazide (PRINZIDE,ZESTORETIC) 10-12.5 MG tablet Take 0.5 tablets by mouth daily.  02/18/16   [provider]  metFORMIN (GLUCOPHAGE) 500 MG tablet Take 500 mg by mouth 2 (two) times daily with a meal.    [provider]  pantoprazole (PROTONIX) 40 MG tablet Take 1 tablet by mouth daily. 03/11/16   [provider]  vitamin B-12 (CYANOCOBALAMIN) 1000 MCG tablet Take 1,000 mcg by mouth daily.    [provider]  Allergies    Atorvastatin calcium, Crestor [rosuvastatin calcium], Lipitor [atorvastatin], and Other  Review of Systems   Review of Systems  Musculoskeletal:       Bilateral wrist and shoulder pain    Physical Exam Updated Vital Signs BP 113/79   Pulse (!) 102   Temp (!) 97.5 F (36.4 C) (Oral)   Resp 17   Ht  (1.753 m)   Wt (!) 161.9 kg   SpO2 98%   BMI 52.72 kg/m   Physical Exam Vitals and nursing note reviewed.  Constitutional:      Appearance: Normal appearance.  HENT:     Head: Normocephalic and atraumatic.     Right Ear: External ear normal.     Left Ear: External ear normal.     Nose: Nose normal.     Mouth/Throat:     Mouth: Mucous membranes are moist.     Pharynx: Oropharynx is clear.  Eyes:     Extraocular Movements: Extraocular movements intact.     Conjunctiva/sclera: Conjunctivae normal.     Pupils: Pupils are equal, round, and reactive to light.  Cardiovascular:     Rate and Rhythm: Normal rate and regular rhythm.     Pulses: Normal pulses.     Heart sounds: Normal heart sounds.  Pulmonary:     Effort: Pulmonary effort is normal.     Breath sounds: Normal breath sounds.  Abdominal:     General: Abdomen is flat. Bowel  sounds are normal.     Palpations: Abdomen is soft.  Musculoskeletal:       Arms:     Cervical back: Normal range of motion and neck supple.     Comments: LUE contractures from prior CVA  Skin:    General: Skin is warm.     Capillary Refill: Capillary refill takes less than 2 seconds.  Neurological:     Mental Status: He is alert and oriented to person, place, and time.     Comments: LUE paralysis from prior stroke LLE weakness from prior stroke  Psychiatric:        Mood and Affect: Mood normal.        Behavior: Behavior normal.     ED Results / Procedures / Treatments   Labs (all labs ordered are listed, but only abnormal results are displayed) Labs Reviewed  CBC WITH DIFFERENTIAL/PLATELET - Abnormal; Notable for the following components:      Result Value   WBC 20.9 (*)    RDW 16.4 (*)    Neutro Abs 14.5 (*)    Monocytes Absolute 2.0 (*)    Abs Immature Granulocytes 0.15 (*)    All other components within normal limits  COMPREHENSIVE METABOLIC PANEL - Abnormal; Notable for the following components:   CO2 20 (*)    Glucose, Bld 188 (*)    Total Protein 8.8 (*)    All other components within normal limits  URINALYSIS, ROUTINE W REFLEX MICROSCOPIC - Abnormal; Notable for the following components:   Glucose, UA 50 (*)    Hgb urine dipstick LARGE (*)    Protein, ur 30 (*)    Nitrite POSITIVE (*)    Leukocytes,Ua LARGE (*)    Bacteria, UA RARE (*)    All other components within normal limits  LACTIC ACID, PLASMA - Abnormal; Notable for the following components:   Lactic Acid, Venous 2.5 (*)    All other components within normal limits  BRAIN NATRIURETIC PEPTIDE - Abnormal; Notable for the following  components:   B Natriuretic Peptide 731.0 (*)    All other components within normal limits  BLOOD GAS, ARTERIAL - Abnormal; Notable for the following components:   pH, Arterial 7.341 (*)    pO2, Arterial 118 (*)    Bicarbonate 19.9 (*)    Acid-base deficit 5.6 (*)    All  other components within normal limits  CBG MONITORING, ED - Abnormal; Notable for the following components:   Glucose-Capillary 215 (*)    All other components within normal limits  TROPONIN I (HIGH SENSITIVITY) - Abnormal; Notable for the following components:   Troponin I (High Sensitivity) 1,795 (*)    All other components within normal limits  RESP PANEL BY RT-PCR (FLU A&B, COVID) ARPGX2  CULTURE, BLOOD (ROUTINE X 2)  CULTURE, BLOOD (ROUTINE X 2)  URINE CULTURE  URINE CULTURE  LACTIC ACID, PLASMA  BLOOD GAS, ARTERIAL  TROPONIN I (HIGH SENSITIVITY)    EKG EKG Interpretation  Date/Time:  Tuesday 10-08-2020 12:26:59 EDT Ventricular Rate:  98 PR Interval:  144 QRS Duration: 141 QT Interval:  387 QTC Calculation: 495 R Axis:   86 Text Interpretation: Sinus rhythm Right bundle branch block ST depression, consider ischemia, diffuse lds Baseline wander in lead(s) V5 No significant change since last tracing Confirmed by Jacalyn Lefevre [phone] on 2020-10-08 12:52:42 PM   Radiology DG Chest 1 View  Result Date: 08-Oct-2020 CLINICAL DATA:  Fall.  Bilateral shoulder pain. EXAM: CHEST  1 VIEW COMPARISON:  10/24/2019 FINDINGS: The cardio pericardial silhouette is enlarged. There is pulmonary vascular congestion without overt pulmonary edema. Bibasilar atelectasis or infiltrate noted, right greater than left. Bones are diffusely demineralized. Telemetry leads overlie the chest. IMPRESSION: 1. Enlargement of the cardiopericardial silhouette with pulmonary vascular congestion. 2. Bibasilar atelectasis or infiltrate, right greater than left. Electronically Signed   By: Kennith Center M.D.   On: 10/08/20 13:46   DG Shoulder Right  Result Date: 10-08-20 CLINICAL DATA:  Right shoulder pain after fall. EXAM: RIGHT SHOULDER - 2+ VIEW COMPARISON:  None. FINDINGS: There is no evidence of fracture or dislocation. There is no evidence of arthropathy or other focal bone abnormality. Soft tissues are  unremarkable. IMPRESSION: Negative. Electronically Signed   By: Obie Dredge M.D.   On: 10/08/2020 13:47   DG Wrist Complete Left  Result Date: 08-Oct-2020 CLINICAL DATA:  Left wrist pain.  No known injury. EXAM: LEFT WRIST - COMPLETE 3+ VIEW COMPARISON:  None. FINDINGS: There is no evidence of fracture or dislocation. Mild first CMC osteoarthritis noted. Soft tissues are unremarkable. IMPRESSION: Mild first CMC osteoarthritis.  Otherwise negative. Electronically Signed   By: Drusilla Kanner M.D.   On: 2020/10/08 13:47   DG Wrist Complete Right  Result Date: Oct 08, 2020 CLINICAL DATA:  Right wrist pain.  No known injury. EXAM: RIGHT WRIST - COMPLETE 3+ VIEW COMPARISON:  None. FINDINGS: There is no evidence of fracture or dislocation. There is no evidence of arthropathy or other focal bone abnormality. Soft tissues are unremarkable. IMPRESSION: Negative exam. Electronically Signed   By: Drusilla Kanner M.D.   On: Oct 08, 2020 13:46   CT Head Wo Contrast  Result Date: Oct 08, 2020 CLINICAL DATA:  Neck trauma (Age >= 65y); Mental status change, unknown cause, fell onto left side last night at home, denies head injury and loss of consciousness EXAM: CT HEAD WITHOUT CONTRAST CT CERVICAL SPINE WITHOUT CONTRAST TECHNIQUE: Multidetector CT imaging of the head and cervical spine was performed following the standard protocol without intravenous contrast.  Multiplanar CT image reconstructions of the cervical spine were also generated. COMPARISON:  CT a head/neck 03/16/2016, MRI head report from 03/14/2016 FINDINGS: CT HEAD FINDINGS Brain: There is encephalomalacia and in the right frontal, right parietal, temporal, and occipital lobes compatible with old infarct. There is also encephalomalacia in the left occipital parietal lobe compatible with prior PCA infarct. Unchanged size of the ventricular system with exception of and ex vacuo dilation of the left posterior horn due to now chronic left PCA infarct. Unchanged  small chronic infarcts in the cerebellum. There is no new loss of gray-white matter differentiation identified on noncontrast CT. Vascular: No hyperdense vessel or unexpected calcification. Skull: Normal. Negative for fracture or focal lesion. Sinuses/Orbits: There is a small left mastoid effusion. The paranasal sinuses are predominantly clear. The orbits are unremarkable. Other: None CT CERVICAL SPINE FINDINGS Mildly motion degraded exam of the cervical spine. Alignment: Straightening of the normal cervical lordosis, likely due to patient positioning or discomfort. Skull base and vertebrae: There is no evidence of acute fracture. Soft tissues and spinal canal: No prevertebral fluid or swelling. No visible canal hematoma. Disc levels: There is degenerative disc disease at C3-C4 with small posterior disc osteophyte complex. Upper chest: Negative Other: None IMPRESSION: Multifocal encephalomalacia in the cerebral and cerebellar hemispheres, right greater than left, compatible with old infarcts as seen on prior imaging in 2017. No acute intracranial hemorrhage or evidence of new large territory infarct. If there is concern for new acute infarct, with recommend MRI. No evidence of acute cervical spine fracture. Degenerative disc disease at C3-C4 with small posterior disc osteophyte complex. Electronically Signed   By: Caprice Renshaw   On: 09/28/2020 13:34   CT Cervical Spine Wo Contrast  Result Date: 09/11/2020 CLINICAL DATA:  Neck trauma (Age >= 65y); Mental status change, unknown cause, fell onto left side last night at home, denies head injury and loss of consciousness EXAM: CT HEAD WITHOUT CONTRAST CT CERVICAL SPINE WITHOUT CONTRAST TECHNIQUE: Multidetector CT imaging of the head and cervical spine was performed following the standard protocol without intravenous contrast. Multiplanar CT image reconstructions of the cervical spine were also generated. COMPARISON:  CT a head/neck 03/16/2016, MRI head report from  03/14/2016 FINDINGS: CT HEAD FINDINGS Brain: There is encephalomalacia and in the right frontal, right parietal, temporal, and occipital lobes compatible with old infarct. There is also encephalomalacia in the left occipital parietal lobe compatible with prior PCA infarct. Unchanged size of the ventricular system with exception of and ex vacuo dilation of the left posterior horn due to now chronic left PCA infarct. Unchanged small chronic infarcts in the cerebellum. There is no new loss of gray-white matter differentiation identified on noncontrast CT. Vascular: No hyperdense vessel or unexpected calcification. Skull: Normal. Negative for fracture or focal lesion. Sinuses/Orbits: There is a small left mastoid effusion. The paranasal sinuses are predominantly clear. The orbits are unremarkable. Other: None CT CERVICAL SPINE FINDINGS Mildly motion degraded exam of the cervical spine. Alignment: Straightening of the normal cervical lordosis, likely due to patient positioning or discomfort. Skull base and vertebrae: There is no evidence of acute fracture. Soft tissues and spinal canal: No prevertebral fluid or swelling. No visible canal hematoma. Disc levels: There is degenerative disc disease at C3-C4 with small posterior disc osteophyte complex. Upper chest: Negative Other: None IMPRESSION: Multifocal encephalomalacia in the cerebral and cerebellar hemispheres, right greater than left, compatible with old infarcts as seen on prior imaging in 2017. No acute intracranial hemorrhage or evidence of  new large territory infarct. If there is concern for new acute infarct, with recommend MRI. No evidence of acute cervical spine fracture. Degenerative disc disease at C3-C4 with small posterior disc osteophyte complex. Electronically Signed   By: Caprice Renshaw   On: 09/13/2020 13:34   DG Shoulder Left  Result Date: 09/09/2020 CLINICAL DATA:  Left shoulder pain. EXAM: LEFT SHOULDER - 2+ VIEW COMPARISON:  None. FINDINGS: No  acute fracture or dislocation identified. Bones are osteopenic. Mild degenerative disease of the glenohumeral joint and proximal humerus. No bony lesions or destruction. IMPRESSION: No acute findings.  Mild degenerative disease of the shoulder. Electronically Signed   By: Irish Lack M.D.   On: 09/18/2020 13:47    Procedures Procedures   Medications Ordered in ED Medications  cefTRIAXone (ROCEPHIN) 1 g in sodium chloride 0.9 % 100 mL IVPB (has no administration in time range)  aspirin EC tablet 325 mg (has no administration in time range)  clopidogrel (PLAVIX) tablet 75 mg (has no administration in time range)  pantoprazole (PROTONIX) EC tablet 40 mg (has no administration in time range)  vitamin B-12 (CYANOCOBALAMIN) tablet 1,000 mcg (has no administration in time range)  morphine 4 MG/ML injection 4 mg (4 mg Intravenous Given 09/27/2020 1341)  ondansetron (ZOFRAN) injection 4 mg (4 mg Intravenous Given 09/30/2020 1326)  furosemide (LASIX) injection 40 mg (40 mg Intravenous Given 09/26/2020 1426)    ED Course  I have reviewed the triage vital signs and the nursing notes.  Pertinent labs & imaging results that were available during my care of the patient were reviewed by me and considered in my medical decision making (see chart for details).    MDM Rules/Calculators/A&P                          No traumatic injuries noted on CTs or Xrays.  After pt came back from CT, he was very sob and diaphoretic.  His O2 sat dropped to 82%.  He was put on oxygen via [place] and O2 sats only went to 86%.  He was put on a NRB and O2 sats only 89%.  He was more confused.  RT called and pt put on bipap.  This has significantly improved pt's resp status.  Pt has a hx of CHF (per chart; wife said she's never been told that).  Last echo 2017 showed an EF of 50-55%..  I suspect he went into flash pulmonary edema after lying flat for his CT scans.  Pt d/w Dr. Marland Mcalpine (triad) for admission.  He requests I contact  cards.  Of note, pt's troponin came back elevated at 1795  Pt d/w cards who did see pt.  Stat Echo and heparin.  CRITICAL CARE Performed by: Jacalyn Lefevre   Total critical care time: 30 minutes  Critical care time was exclusive of separately billable procedures and treating other patients.  Critical care was necessary to treat or prevent imminent or life-threatening deterioration.  Critical care was time spent personally by me on the following activities: development of treatment plan with patient and/or surrogate as well as nursing, discussions with consultants, evaluation of patient's response to treatment, examination of patient, obtaining history from patient or surrogate, ordering and performing treatments and interventions, ordering and review of laboratory studies, ordering and review of radiographic studies, pulse oximetry and re-evaluation of patient's condition.  Final Clinical Impression(s) / ED Diagnoses Final diagnoses:  Acute respiratory failure with hypoxia (HCC)  Fall, initial  encounter  Acute cystitis without hematuria  Elevated troponin    Rx / DC Orders ED Discharge Orders    None       Jacalyn Lefevre, MD 09/08/2020 1536

## 2020-09-17 NOTE — ED Notes (Signed)
Pt in resp distress following return from CT.  Dr Particia Nearing in to assess pt.  Pt was placed on O2, increasing to 6L/m via n/c then changed to NRB.  RT in to placed on Bi-PAP per Dr Particia Nearing.

## 2020-09-17 NOTE — H&P (Signed)
History and Physical    Shaun Pugh. ZOX:[id number] DOB: August 29, 1950 DOA: 09/28/2020  PCP: Benita Stabile, MD   Patient coming from: Home  Chief Complaint: Fall; Wrist Pain; SOB and Diaphoresis   HPI: Shaun Pugh. is a 70 y.o. male with medical history significant for but not limited too history of acute pancreatitis, history of CAD with drug-eluting stent to the mid LAD and distal RCA back in 2005, carpal tunnel disease, chronic back pain, essential hypertension, history of nephrolithiasis, history of 3 CVAs, history of hydroureteronephrosis, history of MI in the past, Meibomian ganglion dysfunction as well as other comorbidities who presented to the emergency room with acute fall yesterday and complains of bilateral wrist pain or shoulder pain.  Patient is an extremely poor historian given his vascular dementia in the setting of his CVA and when asked what happened yesterday he he states that he fell but wife states that he was transferring from his wheelchair to the bed and he just slid down.  He came in because of wrist pain and then when he went to the scanner to get a CT scan he was laying flat and came back extremely pale and diaphoretic extremely short of breath.  Further work-up was done and showed that he had elevated troponin as well as elevated BNP and he was given IV Lasix.  Patient was placed on nasal cannula without improvement so he was transferred to a nonrebreather.  Patient still was not breathing very well so that he is placed on BiPAP with improvement.  Given his acute respiratory failure with hypoxia requiring noninvasive positive pressure ventilation and elevated BNP and elevated troponin TRH was asked to admit this patient.  Patient denies any chest pain but does admit to having some shortness of breath and no lightheadedness or dizziness.  Wife states that she has had urinary tract infection about 6 weeks ago.  Cardiology has been consulted and offer the patient aggressive  measures and transfer to [facility] however he declined these and so cardiology is recommending medical treatment with IV heparin and obtaining echocardiogram.  ED Course: In the ED patient had basic blood work done as well as a CT of the head and spine as well as multiple x-rays of the chest shoulders and wrist.  He had an EKG done and was given IV Lasix for the last x1 and placed on the BiPAP.  Of note COVID testing was negative.  Cardiology was consulted for further evaluation recommendations.  Review of Systems: As per HPI otherwise all other systems reviewed and negative.   Past Medical History:  Diagnosis Date  . Acute pancreatitis 09/14/2011  . Atrophy of right kidney 09/15/2011  . CAD (coronary artery disease)    DES to mid LAD and distal RCA 2005 - Dr. Amil Amen  . Carotid artery disease (HCC)   . Carpal tunnel syndrome, bilateral   . Chronic back pain   . Essential hypertension   . GERD (gastroesophageal reflux disease)   . History of gallstones   . History of kidney stones   . History of stroke 03/18/2012   Aortic atheroma implicated, was on Coumadin for a period of time and more recently aspirin and Plavix  . History of stroke 03/2016   Left occipital lobe PCA infarct, residual partial loss of visual fields  . History of stroke 11/2011   Large right MCA, residual left hemiparesis  . History of toe fracture   . Hydroureteronephrosis 05/20/2013   Mod to severe right,  mild perinephric stranding secondary to 10x7 mm calculus distal right ureter  . Hyperlipidemia   . Iliac artery aneurysm (HCC) 09/14/2011   Right common, nonaneurysmal dilatation of the infrrenal aorta, noted on CT abd/pelvis  . Meibomian gland dysfunction   . Myocardial infarction (HCC)   . Pneumonia 03/2012  . Retinal artery branch occlusion 11/2011   Secondary to thromboembolic disease, lost vision in Left eye  . Seizures (HCC)    Following stroke  . Senile cataracts of both eyes   . Sleep apnea    Reportedly  intolerant of CPAP  . Type 2 diabetes mellitus (HCC)   . Wears dentures     Past Surgical History:  Procedure Laterality Date  . BACK SURGERY     LUMBAR SURGERIES x 4 - INCLUDING FUSIONS  . CARPAL TUNNEL REPAIR Left   . CHOLECYSTECTOMY  09/25/2011   Procedure: LAPAROSCOPIC CHOLECYSTECTOMY;  Surgeon: Fabio Bering, MD;  Location: [facility];  Service: General;  Laterality: N/A;  . COLONOSCOPY    . CORONARY ANGIOPLASTY  2005   Heart stents  . CYSTOSCOPY WITH RETROGRADE PYELOGRAM, URETEROSCOPY AND STENT PLACEMENT Right 05/26/2013   Procedure: CYSTOSCOPY, RIGHT RETROGRADE PYELOGRAM, RIGHT URETEROSCOPY AND RIGHT STENT PLACEMENT, stone extraction, biopsy;  Surgeon: Crist Fat, MD;  Location: [facility];  Service: Urology;  Laterality: Right;  . ENDARTERECTOMY Right 09/04/2016   Procedure: ENDARTERECTOMY CAROTID;  Surgeon: Nada Libman, MD;  Location: [facility] OR;  Service: Vascular;  Laterality: Right;  . HERNIA REPAIR     INGUINAL HERNIA REPAIR MAYBE 30 YRS AGO  . MINOR NAILBED REPAIR Left 01/20/2018   Procedure: NAILBED DEBRIDEMENT AND REPAIR;  Surgeon: Bradly Bienenstock, MD;  Location: [facility];  Service: Orthopedics;  Laterality: Left;  Marland Kitchen MULTIPLE TOOTH EXTRACTIONS     SOCIAL HISTORY   reports that he quit smoking about 8 years ago. His smoking use included cigarettes. He has a 45.00 pack-year smoking history. He has never used smokeless tobacco. He reports that he does not drink alcohol and does not use drugs.  Allergies  Allergen Reactions  . Atorvastatin Calcium   . Crestor [Rosuvastatin Calcium]     Joint pain and weakness   . Lipitor [Atorvastatin]     Joint pain and weakness  . Other    Family History  Problem Relation Age of Onset  . Congestive Heart Failure Mother 53  . Diabetes Father 36  . Heart attack Father   . Anesthesia problems Neg Hx   . Hypotension Neg Hx   . Malignant hyperthermia Neg Hx   . Pseudochol deficiency Neg Hx    Prior to Admission  medications   Medication Sig Start Date End Date Taking? Authorizing Provider  aspirin EC 325 MG tablet Take 1 tablet (325 mg total) by mouth daily. 06/24/16   Marvel Plan, MD  calcium-vitamin D (OSCAL WITH D) 500-200 MG-UNIT tablet Take 1 tablet by mouth daily with breakfast.    [provider]  clobetasol cream (TEMOVATE) 0.05 % Apply 1 application topically as needed.     [provider]  clopidogrel (PLAVIX) 75 MG tablet TAKE 1 TABLET BY MOUTH ONCE DAILY 06/16/17   Marvel Plan, MD  escitalopram (LEXAPRO) 10 MG tablet Take 10 mg by mouth daily.  08/15/17   [provider]  fluticasone (CUTIVATE) 0.05 % cream Apply 1 application topically as needed.     [provider]  glipiZIDE (GLUCOTROL XL) 10 MG 24 hr tablet Take 10 mg by  mouth daily with breakfast.    [provider]  HYDROcodone-acetaminophen (NORCO/VICODIN) 5-325 MG tablet Take 1 tablet by mouth every 6 (six) hours as needed. 10/24/19   Bethann Berkshire, MD  insulin glargine (LANTUS) 100 UNIT/ML injection Inject 60 Units into the skin at bedtime.    [provider]  lisinopril-hydrochlorothiazide (PRINZIDE,ZESTORETIC) 10-12.5 MG tablet Take 0.5 tablets by mouth daily.  02/18/16   [provider]  metFORMIN (GLUCOPHAGE) 500 MG tablet Take 500 mg by mouth 2 (two) times daily with a meal.    [provider]  pantoprazole (PROTONIX) 40 MG tablet Take 1 tablet by mouth daily. 03/11/16   [provider]  vitamin B-12 (CYANOCOBALAMIN) 1000 MCG tablet Take 1,000 mcg by mouth daily.    [provider]   Physical Exam: Vitals:   09/27/20 1300 09/27/20 1400 09/27/20 1415 09-27-20 1557  BP: 105/76 118/83 113/79   Pulse: (!) 102 (!) 102 (!) 102   Resp: (!) Temp:      TempSrc:      SpO2: 96% 97% 98% 100%  Weight:      Height:       Constitutional: WN/WD super morbidly obese Caucasian male currently in mild respiratory distress on the BiPAP Eyes: Lids  and conjunctivae normal, sclerae anicteric  ENMT: External Ears, Nose appear normal. Grossly normal hearing. Neck: Appears normal, supple, no cervical masses, normal ROM, no appreciable thyromegaly; slight JVD but this is difficult to assess given her body habitus  Respiratory: Diminished to auscultation bilaterally with coarse breath sounds and some crackles, no wheezing, rales, rhonchi. Normal respiratory effort and patient is not tachypenic. No accessory muscle use.  Cardiovascular: Slightly tachycardic rate, no murmurs / rubs / gallops. S1 and S2 auscultated.  Has mild lower extremity edema Abdomen: Soft, non-tender, distended secondary to body habitus.  No masses palpated. No appreciable hepatosplenomegaly. Bowel sounds positive.  GU: Deferred. Musculoskeletal: No clubbing / cyanosis of digits/nails. No joint deformity upper and lower extremities.  Skin: No rashes, lesions, ulcers on limited skin evaluation. No induration; Warm and dry.  Neurologic: CN 2-12 grossly intact with no focal deficits. Romberg sign and cerebellar reflexes not assessed.  Psychiatric: Normal judgment and insight. Alert and oriented x 3.  Mildly anxious mood and appropriate affect.   Labs on Admission: I have personally reviewed following labs and imaging studies  CBC: Recent Labs  Lab 27-Sep-2020 1322  WBC 20.9*  NEUTROABS 14.5*  HGB 14.3  HCT 45.9  MCV 91.3  PLT 232   Basic Metabolic Panel: Recent Labs  Lab Sep 27, 2020 1322  NA 136  K 4.3  CL 104  CO2 20*  GLUCOSE 188*  BUN 21  CREATININE 1.22  CALCIUM 9.3   GFR: Estimated Creatinine Clearance: 85.4 mL/min (by C-G formula based on SCr of 1.22 mg/dL). Liver Function Tests: Recent Labs  Lab Sep 27, 2020 1322  AST 38  ALT 21  ALKPHOS 62  BILITOT 0.9  PROT 8.8*  ALBUMIN 4.3   No results for input(s): LIPASE, AMYLASE in the last 168 hours. No results for input(s): AMMONIA in the last 168 hours. Coagulation Profile: No results for input(s): INR,  PROTIME in the last 168 hours. Cardiac Enzymes: No results for input(s): CKTOTAL, CKMB, CKMBINDEX, TROPONINI in the last 168 hours. BNP (last 3 results) No results for input(s): PROBNP in the last 8760 hours. HbA1C: No results for input(s): HGBA1C in the last 72 hours. CBG: Recent Labs  Lab 2020-09-27 1348  GLUCAP  215*   Lipid Profile: No results for input(s): CHOL, HDL, LDLCALC, TRIG, CHOLHDL, LDLDIRECT in the last 72 hours. Thyroid Function Tests: No results for input(s): TSH, T4TOTAL, FREET4, T3FREE, THYROIDAB in the last 72 hours. Anemia Panel: No results for input(s): VITAMINB12, FOLATE, FERRITIN, TIBC, IRON, RETICCTPCT in the last 72 hours. Urine analysis:    Component Value Date/Time   COLORURINE YELLOW 09/15/2020 1355   APPEARANCEUR CLEAR 09/22/2020 1355   LABSPEC 1.025 09/23/2020 1355   PHURINE 5.0 09/29/2020 1355   GLUCOSEU 50 (A) 09/27/2020 1355   HGBUR LARGE (A) 10/02/2020 1355   BILIRUBINUR NEGATIVE 10/01/2020 1355   KETONESUR NEGATIVE 09/08/2020 1355   PROTEINUR 30 (A) 09/22/2020 1355   UROBILINOGEN 0.2 05/20/2013 2220   NITRITE POSITIVE (A) 09/16/2020 1355   LEUKOCYTESUR LARGE (A) 09/19/2020 1355   Sepsis Labs: !!!!!!!!!!!!!!!!!!!!!!!!!!!!!!!!!!!!!!!!!!!! @LABRCNTIP (procalcitonin:4,lacticidven:4) ) Recent Results (from the past 240 hour(s))  Resp Panel by RT-PCR (Flu A&B, Covid) Nasopharyngeal Swab     Status: None   Collection Time: 10/03/2020  1:53 PM   Specimen: Nasopharyngeal Swab; Nasopharyngeal(NP) swabs in vial transport medium  Result Value Ref Range Status   SARS Coronavirus 2 by RT PCR NEGATIVE NEGATIVE Final    Comment: (NOTE) SARS-CoV-2 target nucleic acids are NOT DETECTED.  The SARS-CoV-2 RNA is generally detectable in upper respiratory specimens during the acute phase of infection. The lowest concentration of SARS-CoV-2 viral copies this assay can detect is 138 copies/mL. A negative result does not preclude SARS-Cov-2 infection and should  not be used as the sole basis for treatment or other patient management decisions. A negative result may occur with  improper specimen collection/handling, submission of specimen other than nasopharyngeal swab, presence of viral mutation(s) within the areas targeted by this assay, and inadequate number of viral copies(<138 copies/mL). A negative result must be combined with clinical observations, patient history, and epidemiological information. The expected result is Negative.  Fact Sheet for Patients:  BloggerCourse.comhttps://www.fda.gov/media/152166/download  Fact Sheet for Healthcare Providers:  SeriousBroker.ithttps://www.fda.gov/media/152162/download  This test is no t yet approved or cleared by the [place] FDA and  has been authorized for detection and/or diagnosis of SARS-CoV-2 by FDA under an Emergency Use Authorization (EUA). This EUA will remain  in effect (meaning this test can be used) for the duration of the COVID-19 declaration under Section 564(b)(1) of the Act, 21 U.S.C.section 360bbb-3(b)(1), unless the authorization is terminated  or revoked sooner.       Influenza A by PCR NEGATIVE NEGATIVE Final   Influenza B by PCR NEGATIVE NEGATIVE Final    Comment: (NOTE) The Xpert Xpress SARS-CoV-2/FLU/RSV plus assay is intended as an aid in the diagnosis of influenza from Nasopharyngeal swab specimens and should not be used as a sole basis for treatment. Nasal washings and aspirates are unacceptable for Xpert Xpress SARS-CoV-2/FLU/RSV testing.  Fact Sheet for Patients: BloggerCourse.comhttps://www.fda.gov/media/152166/download  Fact Sheet for Healthcare Providers: SeriousBroker.ithttps://www.fda.gov/media/152162/download  This test is not yet approved or cleared by the [place] FDA and has been authorized for detection and/or diagnosis of SARS-CoV-2 by FDA under an Emergency Use Authorization (EUA). This EUA will remain in effect (meaning this test can be used) for the duration of the COVID-19 declaration under  Section 564(b)(1) of the Act, 21 U.S.C. section 360bbb-3(b)(1), unless the authorization is terminated or revoked.  Performed at [facility], [address] [place], [place] [address]      Radiological Exams on Admission: DG Chest 1 View  Result Date: 09/27/2020 CLINICAL DATA:  Fall.  Bilateral  shoulder pain. EXAM: CHEST  1 VIEW COMPARISON:  10/24/2019 FINDINGS: The cardio pericardial silhouette is enlarged. There is pulmonary vascular congestion without overt pulmonary edema. Bibasilar atelectasis or infiltrate noted, right greater than left. Bones are diffusely demineralized. Telemetry leads overlie the chest. IMPRESSION: 1. Enlargement of the cardiopericardial silhouette with pulmonary vascular congestion. 2. Bibasilar atelectasis or infiltrate, right greater than left. Electronically Signed   By: Kennith Center M.D.   On: 29-Sep-2020 13:46   DG Shoulder Right  Result Date: 2020-09-29 CLINICAL DATA:  Right shoulder pain after fall. EXAM: RIGHT SHOULDER - 2+ VIEW COMPARISON:  None. FINDINGS: There is no evidence of fracture or dislocation. There is no evidence of arthropathy or other focal bone abnormality. Soft tissues are unremarkable. IMPRESSION: Negative. Electronically Signed   By: Obie Dredge M.D.   On: Sep 29, 2020 13:47   DG Wrist Complete Left  Result Date: 09-29-20 CLINICAL DATA:  Left wrist pain.  No known injury. EXAM: LEFT WRIST - COMPLETE 3+ VIEW COMPARISON:  None. FINDINGS: There is no evidence of fracture or dislocation. Mild first CMC osteoarthritis noted. Soft tissues are unremarkable. IMPRESSION: Mild first CMC osteoarthritis.  Otherwise negative. Electronically Signed   By: Drusilla Kanner M.D.   On: 09-29-20 13:47   DG Wrist Complete Right  Result Date: 09/29/2020 CLINICAL DATA:  Right wrist pain.  No known injury. EXAM: RIGHT WRIST - COMPLETE 3+ VIEW COMPARISON:  None. FINDINGS: There is no evidence of fracture or dislocation. There is no evidence of arthropathy  or other focal bone abnormality. Soft tissues are unremarkable. IMPRESSION: Negative exam. Electronically Signed   By: Drusilla Kanner M.D.   On: 29-Sep-2020 13:46   CT Head Wo Contrast  Result Date: 09-29-20 CLINICAL DATA:  Neck trauma (Age >= 65y); Mental status change, unknown cause, fell onto left side last night at home, denies head injury and loss of consciousness EXAM: CT HEAD WITHOUT CONTRAST CT CERVICAL SPINE WITHOUT CONTRAST TECHNIQUE: Multidetector CT imaging of the head and cervical spine was performed following the standard protocol without intravenous contrast. Multiplanar CT image reconstructions of the cervical spine were also generated. COMPARISON:  CT a head/neck 03/16/2016, MRI head report from 03/14/2016 FINDINGS: CT HEAD FINDINGS Brain: There is encephalomalacia and in the right frontal, right parietal, temporal, and occipital lobes compatible with old infarct. There is also encephalomalacia in the left occipital parietal lobe compatible with prior PCA infarct. Unchanged size of the ventricular system with exception of and ex vacuo dilation of the left posterior horn due to now chronic left PCA infarct. Unchanged small chronic infarcts in the cerebellum. There is no new loss of gray-white matter differentiation identified on noncontrast CT. Vascular: No hyperdense vessel or unexpected calcification. Skull: Normal. Negative for fracture or focal lesion. Sinuses/Orbits: There is a small left mastoid effusion. The paranasal sinuses are predominantly clear. The orbits are unremarkable. Other: None CT CERVICAL SPINE FINDINGS Mildly motion degraded exam of the cervical spine. Alignment: Straightening of the normal cervical lordosis, likely due to patient positioning or discomfort. Skull base and vertebrae: There is no evidence of acute fracture. Soft tissues and spinal canal: No prevertebral fluid or swelling. No visible canal hematoma. Disc levels: There is degenerative disc disease at C3-C4  with small posterior disc osteophyte complex. Upper chest: Negative Other: None IMPRESSION: Multifocal encephalomalacia in the cerebral and cerebellar hemispheres, right greater than left, compatible with old infarcts as seen on prior imaging in 2017. No acute intracranial hemorrhage or evidence of new large territory infarct.  If there is concern for new acute infarct, with recommend MRI. No evidence of acute cervical spine fracture. Degenerative disc disease at C3-C4 with small posterior disc osteophyte complex. Electronically Signed   By: Caprice Renshaw   On: Sep 28, 2020 13:34   CT Cervical Spine Wo Contrast  Result Date: Sep 28, 2020 CLINICAL DATA:  Neck trauma (Age >= 65y); Mental status change, unknown cause, fell onto left side last night at home, denies head injury and loss of consciousness EXAM: CT HEAD WITHOUT CONTRAST CT CERVICAL SPINE WITHOUT CONTRAST TECHNIQUE: Multidetector CT imaging of the head and cervical spine was performed following the standard protocol without intravenous contrast. Multiplanar CT image reconstructions of the cervical spine were also generated. COMPARISON:  CT a head/neck 03/16/2016, MRI head report from 03/14/2016 FINDINGS: CT HEAD FINDINGS Brain: There is encephalomalacia and in the right frontal, right parietal, temporal, and occipital lobes compatible with old infarct. There is also encephalomalacia in the left occipital parietal lobe compatible with prior PCA infarct. Unchanged size of the ventricular system with exception of and ex vacuo dilation of the left posterior horn due to now chronic left PCA infarct. Unchanged small chronic infarcts in the cerebellum. There is no new loss of gray-white matter differentiation identified on noncontrast CT. Vascular: No hyperdense vessel or unexpected calcification. Skull: Normal. Negative for fracture or focal lesion. Sinuses/Orbits: There is a small left mastoid effusion. The paranasal sinuses are predominantly clear. The orbits are  unremarkable. Other: None CT CERVICAL SPINE FINDINGS Mildly motion degraded exam of the cervical spine. Alignment: Straightening of the normal cervical lordosis, likely due to patient positioning or discomfort. Skull base and vertebrae: There is no evidence of acute fracture. Soft tissues and spinal canal: No prevertebral fluid or swelling. No visible canal hematoma. Disc levels: There is degenerative disc disease at C3-C4 with small posterior disc osteophyte complex. Upper chest: Negative Other: None IMPRESSION: Multifocal encephalomalacia in the cerebral and cerebellar hemispheres, right greater than left, compatible with old infarcts as seen on prior imaging in 2017. No acute intracranial hemorrhage or evidence of new large territory infarct. If there is concern for new acute infarct, with recommend MRI. No evidence of acute cervical spine fracture. Degenerative disc disease at C3-C4 with small posterior disc osteophyte complex. Electronically Signed   By: Caprice Renshaw   On: 28-Sep-2020 13:34   DG Shoulder Left  Result Date: Sep 28, 2020 CLINICAL DATA:  Left shoulder pain. EXAM: LEFT SHOULDER - 2+ VIEW COMPARISON:  None. FINDINGS: No acute fracture or dislocation identified. Bones are osteopenic. Mild degenerative disease of the glenohumeral joint and proximal humerus. No bony lesions or destruction. IMPRESSION: No acute findings.  Mild degenerative disease of the shoulder. Electronically Signed   By: Irish Lack M.D.   On: 2020/09/28 13:47    EKG: Independently reviewed.  It showed a sinus rhythm with a heart rate of 98 and a QTC of 498.  Diffuse ST depression noted and right bundle branch block.  Assessment/Plan Active Problems:   Cerebrovascular accident (CVA) due to embolism of left posterior cerebral artery (HCC)   Diabetes mellitus (HCC)   Essential hypertension   Other hyperlipidemia   History of stroke   Acute respiratory failure with hypoxia (HCC)   Elevated troponin  Acute Respiratory  Failure with Hypoxia in the setting of Flash Pulmonary Edema with suspected CHF Decompensation and will need to r/o ACS -Patient presents with SOB after his CT scan and Room Air Saturation <90% (82%); He was laying flat in the scanner -SpO2:  100 % FiO2 (%): 40 % -ABG has been obtained and showed:    Component Value Date/Time   PHART 7.341 (L) 09-26-20 1458   PCO2ART 36.3 09/26/2020 1458   PO2ART 118 (H) 09-26-20 1458   HCO3 19.9 (L) Sep 26, 2020 1458   TCO2 22 01/20/2018 1057   ACIDBASEDEF 5.6 (H) 2020-09-26 1458   O2SAT 97.8 2020/09/26 1458   -Admission CXR done and showed "Enlargement of the cardiopericardial silhouette with pulmonary vascular congestion. Bibasilar atelectasis or infiltrate, right greater than left." -BNP was 731.0; Given a Dose of IV Lasix  -Strict I's and O's and Daily Weights -Continuous Pulse Oximetry and Maintain O2 Saturations >90% -Continue Supplemental O2 via [place] and Wean O2 as Tolerated -Will need an Ambulatory Home O2 Screen prior to D/C  Elevated Troponin and Abnormal EKG in a patient with a history of CAD; Concern for ACS -May be related to his flash pulmonary edema and Respiratory Decompensation and will need to r/o ACS -Initial Troponin was 1795  -Per EDP patient was hypoxic and diaphoretic on coming back from his CT scan -Check EKG and showed Diffuse ST Depressions  -C/w ASA 325 mg po Daily and Clopidogrel 75 mg po Daily  -Check ECHOCardiogram and will start Heparin gtt empirically -Consult Cardiology for further evaluation and Recommendations; May need transfer to [facility] for further Ischemic Workup   Metabolic Acidosis -Paitent's CO2 is 20, AG is 12, and Chloride Level is 104 -Hold off IVF given Flash Pulmonary Edema -Continue to Monitor and Trend; Repeat CMP in the AM   Fall with acute wrist pain bilaterally -Head CT w/o Contrast showed "Multifocal encephalomalacia in the cerebral and cerebellar hemispheres, right greater than left, compatible  with old infarcts as seen on prior imaging in 2017. No acute intracranial hemorrhage or evidence of new large territory infarct. If there is concern for new acute infarct, with recommend MRI." -CT Cervical Spin w/o Contrast showed "No evidence of acute cervical spine fracture. Degenerative disc disease at C3-C4 with small posterior disc osteophyte complex." -Left Shoulder X-Ray showed "No acute findings.  Mild degenerative disease of the shoulder." -Right Shoulder X-Ray showed "There is no evidence of fracture or dislocation. There is no evidence of arthropathy or other focal bone abnormality. Soft tissues are unremarkable." -Left Wrist X-Ray showed "Mild first CMC osteoarthritis.  Otherwise negative." -Right Wrist X-Ray showed "There is no evidence of fracture or dislocation. There is no evidence of  arthropathy or other focal bone abnormality. Soft tissues are unremarkable." -Will need PT/OT to evaluate and Treat  Suspected UTI, present on Admission -Patient presented with some confusion per family and a urinalysis was done which showed a clear appearance, negative bilirubin, yellow color urine, 50 glucose, large hemoglobin, large leukocytes, positive nitrites, rare bacteria, 11-20 RBCs per high-power field, 21-50 WBCs -Blood cultures x2 are pending and urine culture not ordered so we will order one now -Lactic acid level was 2.5 -Initially given IV ceftriaxone which we will continue  Multiple CVAs -Had a large right MCA stroke in 2013 and a left occipital PCA infarct in 2017 -Continue with Aspirin 325 mg po Daily and Clopidogrel 75 mg po Daily -Aortic Atheroma was implicated for CVA in 2013 and patient had been on Coumadin for a period of time  Diabetes Mellitus Type 2 -Hold Home Metformin 500 mg po BID, Glipizide 10 mg po Daily with Breakfast -C/w Lantus but reduce to from 60 units sq qHS to 30 units sq qHS -Place on Moderate Novolog SSI AC/HS  Essential Hypertension -Holding  Lisinopril-HCTZ 10-12.5 mg (0.5 tab) po daily -Last BP was 113/79 -Continue to Monitor BP per Protocol  HLD -Does not appear to be on a Statin due to Joint Pain and Weakness  GERD -C/w Pantoprazole 40 mg po Daily  Super Morbid Obesity -Complicates overall prognosis and Care -Estimated body mass index is 52.72 kg/m as calculated from the following:   Height as of this encounter: 5\' 9"  (1.753 m).   Weight as of this encounter: 161.9 kg. -Weight Loss and Dietary Counseling given   DVT prophylaxis: Will be started on a Heparin gtt Code Status: DO NOT RESUSCITATE Family Communication: Discussed with wife at bedside  Disposition Plan: Place in SDU and pending further clinical Improvement Consults called: Cardiology Dr. Admission status: Inpatient SDU  Severity of Illness: The appropriate patient status for this patient is INPATIENT. Inpatient status is judged to be reasonable and necessary in order to provide the required intensity of service to ensure the patient's safety. The patient's presenting symptoms, physical exam findings, and initial radiographic and laboratory data in the context of their chronic comorbidities is felt to place them at high risk for further clinical deterioration. Furthermore, it is not anticipated that the patient will be medically stable for discharge from the hospital within 2 midnights of admission. The following factors support the patient status of inpatient.   " The patient's presenting symptoms include Wrist Pain and Respiratory Distress " The worrisome physical exam findings include Diminished breath sounds  " The initial radiographic and laboratory data are worrisome because of Elevated Troponin and Elevated BNP " The chronic co-morbidities are listed as above  * I certify that at the point of admission it is my clinical judgment that the patient will require inpatient hospital care spanning beyond 2 midnights from the point of admission due to  high intensity of service, high risk for further deterioration and high frequency of surveillance required.Deforest Hoyles, D.O. [facility] is on AMION  If 7PM-7AM, please contact night-coverage [email or web address]  Sep 20, 2020, 4:11 PM

## 2020-09-18 ENCOUNTER — Inpatient Hospital Stay (HOSPITAL_COMMUNITY): Payer: Medicare Other

## 2020-09-18 DIAGNOSIS — L8990 Pressure ulcer of unspecified site, unspecified stage: Secondary | ICD-10-CM | POA: Insufficient documentation

## 2020-09-18 DIAGNOSIS — I214 Non-ST elevation (NSTEMI) myocardial infarction: Principal | ICD-10-CM

## 2020-09-18 LAB — CBC
HCT: 41.9 % (ref 39.0–52.0)
Hemoglobin: 13.3 g/dL (ref 13.0–17.0)
MCH: 29 pg (ref 26.0–34.0)
MCHC: 31.7 g/dL (ref 30.0–36.0)
MCV: 91.5 fL (ref 80.0–100.0)
Platelets: 241 10*3/uL (ref 150–400)
RBC: 4.58 MIL/uL (ref 4.22–5.81)
RDW: 16.7 % — ABNORMAL HIGH (ref 11.5–15.5)
WBC: 19.8 10*3/uL — ABNORMAL HIGH (ref 4.0–10.5)
nRBC: 0 % (ref 0.0–0.2)

## 2020-09-18 LAB — COMPREHENSIVE METABOLIC PANEL
ALT: 25 U/L (ref 0–44)
AST: 83 U/L — ABNORMAL HIGH (ref 15–41)
Albumin: 4 g/dL (ref 3.5–5.0)
Alkaline Phosphatase: 56 U/L (ref 38–126)
Anion gap: 12 (ref 5–15)
BUN: 24 mg/dL — ABNORMAL HIGH (ref 8–23)
CO2: 22 mmol/L (ref 22–32)
Calcium: 9.1 mg/dL (ref 8.9–10.3)
Chloride: 105 mmol/L (ref 98–111)
Creatinine, Ser: 1.61 mg/dL — ABNORMAL HIGH (ref 0.61–1.24)
GFR, Estimated: 46 mL/min — ABNORMAL LOW (ref >60)
Glucose, Bld: 214 mg/dL — ABNORMAL HIGH (ref 70–99)
Potassium: 4.6 mmol/L (ref 3.5–5.1)
Sodium: 139 mmol/L (ref 135–145)
Total Bilirubin: 1 mg/dL (ref 0.3–1.2)
Total Protein: 8.1 g/dL (ref 6.5–8.1)

## 2020-09-18 LAB — LIPID PANEL
Cholesterol: 192 mg/dL (ref 0–200)
HDL: 29 mg/dL — ABNORMAL LOW (ref >40)
LDL Cholesterol: 117 mg/dL — ABNORMAL HIGH (ref 0–99)
Total CHOL/HDL Ratio: 6.6 RATIO
Triglycerides: 229 mg/dL — ABNORMAL HIGH (ref <150)
VLDL: 46 mg/dL — ABNORMAL HIGH (ref 0–40)

## 2020-09-18 LAB — GLUCOSE, CAPILLARY
Glucose-Capillary: 215 mg/dL — ABNORMAL HIGH (ref 70–99)
Glucose-Capillary: 234 mg/dL — ABNORMAL HIGH (ref 70–99)
Glucose-Capillary: 184 mg/dL — ABNORMAL HIGH (ref 70–99)
Glucose-Capillary: 193 mg/dL — ABNORMAL HIGH (ref 70–99)

## 2020-09-18 LAB — TSH: TSH: 2.89 u[IU]/mL (ref 0.350–4.500)

## 2020-09-18 LAB — HIV ANTIBODY (ROUTINE TESTING W REFLEX): HIV Screen 4th Generation wRfx: NONREACTIVE

## 2020-09-18 LAB — HEMOGLOBIN A1C
Hgb A1c MFr Bld: 8.1 % — ABNORMAL HIGH (ref 4.8–5.6)
Mean Plasma Glucose: 185.77 mg/dL

## 2020-09-18 LAB — ECHOCARDIOGRAM COMPLETE
Area-P 1/2: 3.42 cm2
Height: 69 in
S' Lateral: 4.89 cm
Weight: 4119.96 oz

## 2020-09-18 LAB — HEPARIN LEVEL (UNFRACTIONATED)
Heparin Unfractionated: 0.2 IU/mL — ABNORMAL LOW (ref 0.30–0.70)
Heparin Unfractionated: 0.42 IU/mL (ref 0.30–0.70)
Heparin Unfractionated: 0.3 IU/mL (ref 0.30–0.70)

## 2020-09-18 LAB — MRSA PCR SCREENING: MRSA by PCR: NEGATIVE

## 2020-09-18 MED ORDER — HEPARIN BOLUS VIA INFUSION
2000.0000 [IU] | Freq: Once | INTRAVENOUS | Status: AC
  Administered 2020-09-18: 2000 [IU] via INTRAVENOUS
  Filled 2020-09-18: qty 2000

## 2020-09-18 MED ORDER — METOPROLOL TARTRATE 25 MG PO TABS
12.5000 mg | ORAL_TABLET | Freq: Two times a day (BID) | ORAL | Status: DC
  Administered 2020-09-18: 12.5 mg via ORAL
  Filled 2020-09-18 (×2): qty 1

## 2020-09-18 NOTE — Progress Notes (Signed)
2D echo complete.   Jeryl Columbia, RDCS

## 2020-09-18 NOTE — Plan of Care (Signed)
  Problem: Acute Rehab OT Goals (only OT should resolve) Goal: Pt. Will Perform Eating Flowsheets (Taken 09/18/2020 1212) Pt Will Perform Eating:  with set-up  sitting Goal: Pt. Will Perform Grooming Flowsheets (Taken 09/18/2020 1212) Pt Will Perform Grooming:  with set-up  sitting  with adaptive equipment Goal: Pt. Will Perform Upper Body Dressing Flowsheets (Taken 09/18/2020 1212) Pt Will Perform Upper Body Dressing:  with adaptive equipment  sitting  with min assist Goal: Pt. Will Perform Lower Body Dressing Flowsheets (Taken 09/18/2020 1212) Pt Will Perform Lower Body Dressing:  with mod assist  sitting/lateral leans  with adaptive equipment Goal: Pt. Will Transfer To Toilet Flowsheets (Taken 09/18/2020 1212) Pt Will Transfer to Toilet:  stand pivot transfer  with min assist  with mod assist  grab bars  bedside commode Goal: Pt/Caregiver Will Perform Home Exercise Program Flowsheets (Taken 09/18/2020 1212) Pt/caregiver will Perform Home Exercise Program:  Increased strength  Right Upper extremity  With Supervision Goal: OT Additional ADL Goal #1 Flowsheets (Taken 09/18/2020 1212) Additional ADL Goal #1: Pt will demonstrate improved postural stability by sitting at EOB with Mod I using UE support via railing as needed.  Lexington Devine OT, MOT

## 2020-09-18 NOTE — Progress Notes (Signed)
PROGRESS NOTE    Shaun Pugh.  JXB:[id number] DOB: May 01, 1951 DOA: 2020-09-29 PCP: Benita Stabile, MD   Chief Complaint  Patient presents with  . Fall    Brief admission narrative:  Shaun Pugh. is a 70 y.o. male with medical history significant for but not limited too history of acute pancreatitis, history of CAD with drug-eluting stent to the mid LAD and distal RCA back in 2005, carpal tunnel disease, chronic back pain, essential hypertension, history of nephrolithiasis, history of 3 CVAs, history of hydroureteronephrosis, history of MI in the past, Meibomian ganglion dysfunction as well as other comorbidities who presented to the emergency room with acute fall yesterday and complains of bilateral wrist pain or shoulder pain.  Patient is an extremely poor historian given his vascular dementia in the setting of his CVA and when asked what happened yesterday he he states that he fell but wife states that he was transferring from his wheelchair to the bed and he just slid down.  He came in because of wrist pain and then when he went to the scanner to get a CT scan he was laying flat and came back extremely pale and diaphoretic extremely short of breath.  Further work-up was done and showed that he had elevated troponin as well as elevated BNP and he was given IV Lasix.  Patient was placed on nasal cannula without improvement so he was transferred to a nonrebreather.  Patient still was not breathing very well so that he is placed on BiPAP with improvement.  Given his acute respiratory failure with hypoxia requiring noninvasive positive pressure ventilation and elevated BNP and elevated troponin TRH was asked to admit this patient.  Patient denies any chest pain but does admit to having some shortness of breath and no lightheadedness or dizziness.  Wife states that she has had urinary tract infection about 6 weeks ago.  Cardiology has been consulted and offer the patient aggressive measures and transfer  to [facility] however he declined these and so cardiology is recommending medical treatment with IV heparin and obtaining echocardiogram.  ED Course: In the ED patient had basic blood work done as well as a CT of the head and spine as well as multiple x-rays of the chest shoulders and wrist.  He had an EKG done and was given IV Lasix for the last x1 and placed on the BiPAP.  Of note COVID testing was negative.  Cardiology was consulted for further evaluation recommendations.  Assessment & Plan: 1-Acute respiratory failure with hypoxia in the setting of flash pulmonary edema with suspected CHF decompensation and NSTEMI. -Continue to wean oxygen supplementation as tolerated -follow 2D echo results -continue heparin drip, aspirin and plavix -continue lopressor  2-carotid artery stenosis -continue aspirin and plavis -outpatient follow by vascular surgery -prior hx of R CEA  3-HTN -currently soft BP -will follow VS -might required adjustment on BP meds  4-HLD -intolerant to statins -will consider use of zetia  5-type 2 diabetes -A1C 8.1 -continue SSI  6-AKI -with concerns for CKD from diabetes at baseline -slightly worse today in the setting of low BP and use of lasix yesterday -will follow trend  -holding diuretics today and minimizing nephrotoxic agents  7-urinary retention -in and out cath done -will follow bladder scan and response  8-pressure injury of skin -POA -continue barrier measures and constant repositioning   9-prior hx of stroke with residual left hemiparesis -no new deficits -continue asa and plavix for secondary prevention -continue risk factors  modifications.   DVT prophylaxis: Heparin drip Code Status: DNR/DNI Family Communication: Wife at bedside. Disposition:   Status is: Inpatient  Dispo: The patient is from: home              Anticipated d/c is to: home              Patient currently not medically stable for discharge; continue weaning off oxygen  supplementation, follow 2-D echo results and complete 48 hours of heparin drip. Patient with NSTEMI. Will follow cardiology rec's.   Difficult to place patient no       Consultants:   Cardiology service    Procedures:  2D echo: Pending  Antimicrobials:  Rocephin  Subjective: Reports no chest pain; no nausea, no fever.  Expressed no abdominal pain at this time.  Still feeling slightly short of breath and using 2.5 L nasal cannula supplementation.  Acute episode of urinary retention  Objective: Vitals:   09/18/20 1500 09/18/20 1605 09/18/20 1700 09/18/20 1800  BP: (!) 93/54  (!) 87/56 (!) 101/57  Pulse: 88 90 88 91  Resp: 16 17 15 20   Temp:  97.8 F (36.6 C)    TempSrc:  Oral    SpO2: 99% 96% 98% 95%  Weight:      Height:        Intake/Output Summary (Last 24 hours) at 09/18/2020 1931 Last data filed at 09/18/2020 1827 Gross per 24 hour  Intake 640.08 ml  Output 275 ml  Net 365.08 ml   Filed Weights   09/24/2020 1154 09/11/2020 1215 09/18/20 0422  Weight: (!) 161.9 kg (!) 161.9 kg 116.8 kg    Examination:  General exam: Reports no chest pain currently; still complaining mild shortness of breath.  Using oxygen supplementation ( [place] 2.5 L).  No nausea, no vomiting. Respiratory system: Good air movement bilaterally, no wheezing, no crackles.  No using accessory muscles Cardiovascular system: Rate controlled, no rubs, no gallops, unable to properly assess JVD due to body habitus. Gastrointestinal system: Abdomen is obese, nondistended, soft and nontender on palpation. Normal bowel sounds heard. Central nervous system: Alert and oriented.  Following commands appropriately.  No new focal deficits.  Patient with residual left hemiparesis from previous stroke. Extremities: No cyanosis or clubbing; no edema. Skin: No rashes, lno petechiae; POA stage 2 pressure injury mid muttocks area, no superimposed infection. Psychiatry: Mood & affect appropriate.     Data Reviewed: I  have personally reviewed following labs and imaging studies  CBC: Recent Labs  Lab 09/27/2020 1322 09/18/20 0027  WBC 20.9* 19.8*  NEUTROABS 14.5*  --   HGB 14.3 13.3  HCT 45.9 41.9  MCV 91.3 91.5  PLT 232 241    Basic Metabolic Panel: Recent Labs  Lab 09/11/2020 1322 09/18/20 0027  NA 136 139  K 4.3 4.6  CL 104 105  CO2 20* 22  GLUCOSE 188* 214*  BUN 21 24*  CREATININE 1.22 1.61*  CALCIUM 9.3 9.1    GFR: Estimated Creatinine Clearance: 53.8 mL/min (A) (by C-G formula based on SCr of 1.61 mg/dL (H)).  Liver Function Tests: Recent Labs  Lab 09/27/2020 1322 09/18/20 0027  AST 38 83*  ALT 21 25  ALKPHOS 62 56  BILITOT 0.9 1.0  PROT 8.8* 8.1  ALBUMIN 4.3 4.0    CBG: Recent Labs  Lab 09/14/2020 1734 09/27/2020 2121 09/18/20 0744 09/18/20 1130 09/18/20 1726  GLUCAP 221* 234* 215* 234* 184*     Recent Results (from the past 240 hour(s))  Resp Panel by RT-PCR (Flu A&B, Covid) Nasopharyngeal Swab     Status: None   Collection Time: 09/11/2020  1:53 PM   Specimen: Nasopharyngeal Swab; Nasopharyngeal(NP) swabs in vial transport medium  Result Value Ref Range Status   SARS Coronavirus 2 by RT PCR NEGATIVE NEGATIVE Final    Comment: (NOTE) SARS-CoV-2 target nucleic acids are NOT DETECTED.  The SARS-CoV-2 RNA is generally detectable in upper respiratory specimens during the acute phase of infection. The lowest concentration of SARS-CoV-2 viral copies this assay can detect is 138 copies/mL. A negative result does not preclude SARS-Cov-2 infection and should not be used as the sole basis for treatment or other patient management decisions. A negative result may occur with  improper specimen collection/handling, submission of specimen other than nasopharyngeal swab, presence of viral mutation(s) within the areas targeted by this assay, and inadequate number of viral copies(<138 copies/mL). A negative result must be combined with clinical observations, patient history,  and epidemiological information. The expected result is Negative.  Fact Sheet for Patients:  BloggerCourse.comhttps://www.fda.gov/media/152166/download  Fact Sheet for Healthcare Providers:  SeriousBroker.ithttps://www.fda.gov/media/152162/download  This test is no t yet approved or cleared by the [place] FDA and  has been authorized for detection and/or diagnosis of SARS-CoV-2 by FDA under an Emergency Use Authorization (EUA). This EUA will remain  in effect (meaning this test can be used) for the duration of the COVID-19 declaration under Section 564(b)(1) of the Act, 21 U.S.C.section 360bbb-3(b)(1), unless the authorization is terminated  or revoked sooner.       Influenza A by PCR NEGATIVE NEGATIVE Final   Influenza B by PCR NEGATIVE NEGATIVE Final    Comment: (NOTE) The Xpert Xpress SARS-CoV-2/FLU/RSV plus assay is intended as an aid in the diagnosis of influenza from Nasopharyngeal swab specimens and should not be used as a sole basis for treatment. Nasal washings and aspirates are unacceptable for Xpert Xpress SARS-CoV-2/FLU/RSV testing.  Fact Sheet for Patients: BloggerCourse.comhttps://www.fda.gov/media/152166/download  Fact Sheet for Healthcare Providers: SeriousBroker.ithttps://www.fda.gov/media/152162/download  This test is not yet approved or cleared by the [place] FDA and has been authorized for detection and/or diagnosis of SARS-CoV-2 by FDA under an Emergency Use Authorization (EUA). This EUA will remain in effect (meaning this test can be used) for the duration of the COVID-19 declaration under Section 564(b)(1) of the Act, 21 U.S.C. section 360bbb-3(b)(1), unless the authorization is terminated or revoked.  Performed at [facility], [address] [place], [place] [address]   Culture, blood (routine x 2)     Status: None (Preliminary result)   Collection Time: 09/19/2020  2:37 PM   Specimen: BLOOD RIGHT HAND  Result Value Ref Range Status   Specimen Description BLOOD RIGHT HAND  Final   Special Requests    Final    BOTTLES DRAWN AEROBIC AND ANAEROBIC Blood Culture adequate volume   Culture   Final    NO GROWTH < 24 HOURS Performed at [facility], [address] [place], [place] [address]    Report Status PENDING  Incomplete  Culture, blood (routine x 2)     Status: None (Preliminary result)   Collection Time: 09/16/2020  2:37 PM   Specimen: BLOOD RIGHT HAND  Result Value Ref Range Status   Specimen Description BLOOD LEFT HAND  Final   Special Requests   Final    BOTTLES DRAWN AEROBIC AND ANAEROBIC Blood Culture adequate volume   Culture   Final    NO GROWTH < 24 HOURS Performed at [facility],  [address], [place], [place] [address]    Report Status PENDING  Incomplete  MRSA PCR Screening     Status: None   Collection Time: 09/18/20  8:50 AM   Specimen: Nasal Mucosa; Nasopharyngeal  Result Value Ref Range Status   MRSA by PCR NEGATIVE NEGATIVE Final    Comment:        The GeneXpert MRSA Assay (FDA approved for NASAL specimens only), is one component of a comprehensive MRSA colonization surveillance program. It is not intended to diagnose MRSA infection nor to guide or monitor treatment for MRSA infections. Performed at [facility], [address] [place], [place] [address]      Radiology Studies: DG Chest 1 View  Result Date: 09/19/2020 CLINICAL DATA:  Fall.  Bilateral shoulder pain. EXAM: CHEST  1 VIEW COMPARISON:  10/24/2019 FINDINGS: The cardio pericardial silhouette is enlarged. There is pulmonary vascular congestion without overt pulmonary edema. Bibasilar atelectasis or infiltrate noted, right greater than left. Bones are diffusely demineralized. Telemetry leads overlie the chest. IMPRESSION: 1. Enlargement of the cardiopericardial silhouette with pulmonary vascular congestion. 2. Bibasilar atelectasis or infiltrate, right greater than left. Electronically Signed   By: Kennith Center M.D.   On: 19-Sep-2020 13:46   DG Shoulder Right  Result Date: 2020/09/19 CLINICAL  DATA:  Right shoulder pain after fall. EXAM: RIGHT SHOULDER - 2+ VIEW COMPARISON:  None. FINDINGS: There is no evidence of fracture or dislocation. There is no evidence of arthropathy or other focal bone abnormality. Soft tissues are unremarkable. IMPRESSION: Negative. Electronically Signed   By: Obie Dredge M.D.   On: 2020-09-19 13:47   DG Wrist Complete Left  Result Date: 09-19-2020 CLINICAL DATA:  Left wrist pain.  No known injury. EXAM: LEFT WRIST - COMPLETE 3+ VIEW COMPARISON:  None. FINDINGS: There is no evidence of fracture or dislocation. Mild first CMC osteoarthritis noted. Soft tissues are unremarkable. IMPRESSION: Mild first CMC osteoarthritis.  Otherwise negative. Electronically Signed   By: Drusilla Kanner M.D.   On: 19-Sep-2020 13:47   DG Wrist Complete Right  Result Date: 09/19/20 CLINICAL DATA:  Right wrist pain.  No known injury. EXAM: RIGHT WRIST - COMPLETE 3+ VIEW COMPARISON:  None. FINDINGS: There is no evidence of fracture or dislocation. There is no evidence of arthropathy or other focal bone abnormality. Soft tissues are unremarkable. IMPRESSION: Negative exam. Electronically Signed   By: Drusilla Kanner M.D.   On: Sep 19, 2020 13:46   CT Head Wo Contrast  Result Date: 19-Sep-2020 CLINICAL DATA:  Neck trauma (Age >= 65y); Mental status change, unknown cause, fell onto left side last night at home, denies head injury and loss of consciousness EXAM: CT HEAD WITHOUT CONTRAST CT CERVICAL SPINE WITHOUT CONTRAST TECHNIQUE: Multidetector CT imaging of the head and cervical spine was performed following the standard protocol without intravenous contrast. Multiplanar CT image reconstructions of the cervical spine were also generated. COMPARISON:  CT a head/neck 03/16/2016, MRI head report from 03/14/2016 FINDINGS: CT HEAD FINDINGS Brain: There is encephalomalacia and in the right frontal, right parietal, temporal, and occipital lobes compatible with old infarct. There is also  encephalomalacia in the left occipital parietal lobe compatible with prior PCA infarct. Unchanged size of the ventricular system with exception of and ex vacuo dilation of the left posterior horn due to now chronic left PCA infarct. Unchanged small chronic infarcts in the cerebellum. There is no new loss of gray-white matter differentiation identified on noncontrast CT. Vascular: No hyperdense vessel or unexpected calcification. Skull: Normal.  Negative for fracture or focal lesion. Sinuses/Orbits: There is a small left mastoid effusion. The paranasal sinuses are predominantly clear. The orbits are unremarkable. Other: None CT CERVICAL SPINE FINDINGS Mildly motion degraded exam of the cervical spine. Alignment: Straightening of the normal cervical lordosis, likely due to patient positioning or discomfort. Skull base and vertebrae: There is no evidence of acute fracture. Soft tissues and spinal canal: No prevertebral fluid or swelling. No visible canal hematoma. Disc levels: There is degenerative disc disease at C3-C4 with small posterior disc osteophyte complex. Upper chest: Negative Other: None IMPRESSION: Multifocal encephalomalacia in the cerebral and cerebellar hemispheres, right greater than left, compatible with old infarcts as seen on prior imaging in 2017. No acute intracranial hemorrhage or evidence of new large territory infarct. If there is concern for new acute infarct, with recommend MRI. No evidence of acute cervical spine fracture. Degenerative disc disease at C3-C4 with small posterior disc osteophyte complex. Electronically Signed   By: Caprice Renshaw   On: Sep 18, 2020 13:34   CT Cervical Spine Wo Contrast  Result Date: 2020-09-18 CLINICAL DATA:  Neck trauma (Age >= 65y); Mental status change, unknown cause, fell onto left side last night at home, denies head injury and loss of consciousness EXAM: CT HEAD WITHOUT CONTRAST CT CERVICAL SPINE WITHOUT CONTRAST TECHNIQUE: Multidetector CT imaging of the  head and cervical spine was performed following the standard protocol without intravenous contrast. Multiplanar CT image reconstructions of the cervical spine were also generated. COMPARISON:  CT a head/neck 03/16/2016, MRI head report from 03/14/2016 FINDINGS: CT HEAD FINDINGS Brain: There is encephalomalacia and in the right frontal, right parietal, temporal, and occipital lobes compatible with old infarct. There is also encephalomalacia in the left occipital parietal lobe compatible with prior PCA infarct. Unchanged size of the ventricular system with exception of and ex vacuo dilation of the left posterior horn due to now chronic left PCA infarct. Unchanged small chronic infarcts in the cerebellum. There is no new loss of gray-white matter differentiation identified on noncontrast CT. Vascular: No hyperdense vessel or unexpected calcification. Skull: Normal. Negative for fracture or focal lesion. Sinuses/Orbits: There is a small left mastoid effusion. The paranasal sinuses are predominantly clear. The orbits are unremarkable. Other: None CT CERVICAL SPINE FINDINGS Mildly motion degraded exam of the cervical spine. Alignment: Straightening of the normal cervical lordosis, likely due to patient positioning or discomfort. Skull base and vertebrae: There is no evidence of acute fracture. Soft tissues and spinal canal: No prevertebral fluid or swelling. No visible canal hematoma. Disc levels: There is degenerative disc disease at C3-C4 with small posterior disc osteophyte complex. Upper chest: Negative Other: None IMPRESSION: Multifocal encephalomalacia in the cerebral and cerebellar hemispheres, right greater than left, compatible with old infarcts as seen on prior imaging in 2017. No acute intracranial hemorrhage or evidence of new large territory infarct. If there is concern for new acute infarct, with recommend MRI. No evidence of acute cervical spine fracture. Degenerative disc disease at C3-C4 with small  posterior disc osteophyte complex. Electronically Signed   By: Caprice Renshaw   On: 09-18-20 13:34   DG Shoulder Left  Result Date: 09-18-20 CLINICAL DATA:  Left shoulder pain. EXAM: LEFT SHOULDER - 2+ VIEW COMPARISON:  None. FINDINGS: No acute fracture or dislocation identified. Bones are osteopenic. Mild degenerative disease of the glenohumeral joint and proximal humerus. No bony lesions or destruction. IMPRESSION: No acute findings.  Mild degenerative disease of the shoulder. Electronically Signed   By: Irish Lack  M.D.   On: 09/25/2020 13:47   ECHOCARDIOGRAM COMPLETE  Result Date: 09/18/2020    ECHOCARDIOGRAM REPORT   Patient Name:   Trusten Hume. Date of Exam: 09/18/2020 Medical Rec #:  [id number]       Height:       69.0 in Accession #:    [id number]      Weight:       257.5 lb Date of Birth:  Apr 16, 1951       BSA:          2.300 m Patient Age:    69 years        BP:           96/59 mmHg Patient Gender: M               HR:           91 bpm. [facility] Procedure: 2D Echo Indications:    NSTEMI I21.4  History:        Patient has prior history of Echocardiogram examinations, most                 recent 03/16/2016. Stroke; Risk Factors:Diabetes, Hypertension,                 Dyslipidemia and Former Smoker. Elevated Troponin, Aortic                 Embolism.  Sonographer:    Jeryl Columbia RDCS (AE) Referring Phys: [id number] Kateri Mc LATIF [facility] IMPRESSIONS  1. Left ventricular ejection fraction, by estimation, is <20%. The left ventricle has severely decreased function. The left ventricle demonstrates regional wall motion abnormalities (see scoring diagram/findings for description). There is mild left ventricular hypertrophy. Left ventricular diastolic parameters are indeterminate.  2. Right ventricular systolic function was not well visualized. The right ventricular size is normal. Tricuspid regurgitation signal is inadequate for assessing PA pressure.  3. The mitral valve is grossly  normal. Trivial mitral valve regurgitation.  4. The aortic valve is tricuspid. Aortic valve regurgitation is not visualized.  5. The inferior vena cava is normal in size with greater than 50% respiratory variability, suggesting right atrial pressure of 3 mmHg. FINDINGS  Left Ventricle: Left ventricular ejection fraction, by estimation, is <20%. The left ventricle has severely decreased function. The left ventricle demonstrates regional wall motion abnormalities. The left ventricular internal cavity size was normal in size. There is mild left ventricular hypertrophy. Left ventricular diastolic parameters are indeterminate.  LV Wall Scoring: The mid inferoseptal segment, apical septal segment, apical anterior segment, and apex are akinetic. The anterior wall, antero-lateral wall, entire inferior wall, and apical lateral segment are hypokinetic. The basal inferoseptal segment is normal. Right Ventricle: The right ventricular size is normal. Right vetricular wall thickness was not assessed. Right ventricular systolic function was not well visualized. Tricuspid regurgitation signal is inadequate for assessing PA pressure. Left Atrium: Left atrial size was normal in size. Right Atrium: Right atrial size was normal in size. Pericardium: There is no evidence of pericardial effusion. Presence of pericardial fat pad. Mitral Valve: The mitral valve is grossly normal. Trivial mitral valve regurgitation. Tricuspid Valve: The tricuspid valve is grossly normal. Tricuspid valve regurgitation is trivial. Aortic Valve: The aortic valve is tricuspid. There is mild to moderate aortic valve annular calcification. Aortic valve regurgitation is not visualized. Pulmonic Valve: The pulmonic valve was grossly normal. Pulmonic valve regurgitation is trivial. Aorta: The aortic root is normal in size and structure. Venous: The inferior  vena cava is normal in size with greater than 50% respiratory variability, suggesting right atrial pressure of  3 mmHg. IAS/Shunts: No atrial level shunt detected by color flow Doppler.  LEFT VENTRICLE PLAX 2D LVIDd:         5.88 cm  Diastology LVIDs:         4.89 cm  LV e' medial:    8.06 cm/s LV PW:         1.25 cm  LV E/e' medial:  9.1 LV IVS:        1.31 cm  LV e' lateral:   5.87 cm/s LVOT diam:     2.00 cm  LV E/e' lateral: 12.6 LVOT Area:     3.14 cm  RIGHT VENTRICLE RV S prime:     9.46 cm/s TAPSE (M-mode): 2.0 cm LEFT ATRIUM             Index       RIGHT ATRIUM           Index LA diam:        4.80 cm 2.09 cm/m  RA Area:     13.20 cm LA Vol (A2C):   70.4 ml 30.61 ml/m RA Volume:   29.50 ml  12.82 ml/m LA Vol (A4C):   58.5 ml 25.43 ml/m LA Biplane Vol: 66.1 ml 28.74 ml/m   AORTA Ao Root diam: 3.00 cm MITRAL VALVE MV Area (PHT): 3.42 cm    SHUNTS MV Decel Time: 222 msec    Systemic Diam: 2.00 cm MV E velocity: 73.70 cm/s Nona Dell MD Electronically signed by Nona Dell MD Signature Date/Time: 09/18/2020/11:13:13 AM    Final     Scheduled Meds: . aspirin EC  325 mg Oral Daily  . Chlorhexidine Gluconate Cloth  6 each Topical Daily  . clopidogrel  75 mg Oral Daily  . insulin aspart  0-15 Units Subcutaneous TID WC  . insulin aspart  0-5 Units Subcutaneous QHS  . insulin glargine  30 Units Subcutaneous Daily  . metoprolol tartrate  12.5 mg Oral BID  . pantoprazole  40 mg Oral Daily  . senna  1 tablet Oral BID  . vitamin B-12  1,000 mcg Oral Daily   Continuous Infusions: . cefTRIAXone (ROCEPHIN)  IV 1 g (09/18/20 1715)  . heparin 1,700 Units/hr (09/18/20 1632)     LOS: 1 day    Time spent: 35 minutes    Vassie Loll, MD [facility]   To contact the attending provider between 7A-7P or the covering provider during after hours 7P-7A, please log into the web site [email or web address] and access using universal [place] password for that web site. If you do not have the password, please call the hospital operator.  09/18/2020, 7:31 PM

## 2020-09-18 NOTE — Plan of Care (Signed)
  Problem: Acute Rehab PT Goals(only PT should resolve) Goal: Pt Will Go Supine/Side To Sit 09/18/2020 0957 by Ocie Bob, PT Outcome: Progressing 09/18/2020 0956 by Ocie Bob, PT Outcome: Progressing Flowsheets (Taken 09/18/2020 0956) Pt will go Supine/Side to Sit: with moderate assist Goal: Patient Will Perform Sitting Balance 09/18/2020 0957 by Ocie Bob, PT Outcome: Progressing Flowsheets (Taken 09/18/2020 0957) Patient will perform sitting balance: with min guard assist 09/18/2020 0956 by Ocie Bob, PT Outcome: Progressing Goal: Pt Will Transfer Bed To Chair/Chair To Bed 09/18/2020 0957 by Ocie Bob, PT Outcome: Progressing Flowsheets (Taken 09/18/2020 0957) Pt will Transfer Bed to Chair/Chair to Bed: with mod assist 09/18/2020 0956 by Ocie Bob, PT Outcome: Progressing Goal: Pt Will Ambulate 09/18/2020 0957 by Ocie Bob, PT Outcome: Progressing Flowsheets (Taken 09/18/2020 0957) Pt will Ambulate:  10 feet  with cane  with moderate assist 09/18/2020 0956 by Ocie Bob, PT Outcome: Progressing   Problem: Acute Rehab PT Goals(only PT should resolve) Goal: Patient Will Transfer Sit To/From Stand Outcome: Progressing Flowsheets (Taken 09/18/2020 0957) Patient will transfer sit to/from stand: with minimal assist   9:58 AM, 09/18/20 Ocie Bob, MPT Physical Therapist with [facility] [phone] office [phone] mobile phone

## 2020-09-18 NOTE — Progress Notes (Signed)
ANTICOAGULATION CONSULT NOTE -  Pharmacy Consult for Heparin Indication: chest pain/ACS  Allergies  Allergen Reactions  . Atorvastatin Calcium   . Crestor [Rosuvastatin Calcium]     Joint pain and weakness   . Lipitor [Atorvastatin]     Joint pain and weakness  . Other     Patient Measurements: Height: 5\' 9"  (175.3 cm) Weight: 116.8 kg (257 lb 8 oz) IBW/kg (Calculated) : 70.7 HEPARIN DW (KG): 110.4  Vital Signs: Temp: 97.1 F (36.2 C) (05/11 0742) Temp Source: Oral (05/11 0742) BP: 96/59 (05/11 0400) Pulse Rate: 99 (05/11 0100)  Labs: Recent Labs    09/22/2020 1322 09/12/2020 1340 09/21/2020 1552 09/18/20 0027 09/18/20 0838  HGB 14.3  --   --  13.3  --   HCT 45.9  --   --  41.9  --   PLT 232  --   --  241  --   HEPARINUNFRC  --   --   --  0.20* 0.42  CREATININE 1.22  --   --  1.61*  --   TROPONINIHS  --  1,795* 2,006*  --   --     Estimated Creatinine Clearance: 53.8 mL/min (A) (by C-G formula based on SCr of 1.61 mg/dL (H)).   Medical History: Past Medical History:  Diagnosis Date  . Acute pancreatitis 09/14/2011  . Atrophy of right kidney 09/15/2011  . CAD (coronary artery disease)    DES to mid LAD and distal RCA 2005 - Dr. 2006  . Carotid artery disease (HCC)   . Carpal tunnel syndrome, bilateral   . Chronic back pain   . Essential hypertension   . GERD (gastroesophageal reflux disease)   . History of gallstones   . History of kidney stones   . History of stroke 03/18/2012   Aortic atheroma implicated, was on Coumadin for a period of time and more recently aspirin and Plavix  . History of stroke 03/2016   Left occipital lobe PCA infarct, residual partial loss of visual fields  . History of stroke 11/2011   Large right MCA, residual left hemiparesis  . History of toe fracture   . Hydroureteronephrosis 05/20/2013   Mod to severe right, mild perinephric stranding secondary to 10x7 mm calculus distal right ureter  . Hyperlipidemia   . Iliac artery  aneurysm (HCC) 09/14/2011   Right common, nonaneurysmal dilatation of the infrrenal aorta, noted on CT abd/pelvis  . Meibomian gland dysfunction   . Myocardial infarction (HCC)   . Pneumonia 03/2012  . Retinal artery branch occlusion 11/2011   Secondary to thromboembolic disease, lost vision in Left eye  . Seizures (HCC)    Following stroke  . Senile cataracts of both eyes   . Sleep apnea    Reportedly intolerant of CPAP  . Type 2 diabetes mellitus (HCC)   . Wears dentures     Medications:  See med rec  Assessment: Pt presents to the ED today with right and left wrist pain from a fall.  Pt has a hx of multiple strokes and has baseline difficulty ambulating.  He said he fell backwards and hurt both wrists and shoulders.  Pt is on plavix, but denies hitting his head. Patient also having SOB and diahphoresis.   He says he's had chest pressure for several days.BNP 731, Troponin 1795, EKG NSR with RBBB diffuse ST depression and ST elevation avR . Patient with NSTEMI/Pulmonary edema He's had 4-5 strokes and is bedridden and cared for by his wife. Pharmacy asked  to start heparin. Not on oral DOACS  HL 0.42, therapeutic  Goal of Therapy:  Heparin level 0.3-0.7 units/ml Monitor platelets by anticoagulation protocol: Yes   Plan:  Continue heparin infusion at 1700 units/hr Check anti-Xa level in ~8  hours and daily while on heparin Continue to monitor H&H and platelets  Elder Cyphers, BS Loura Back, BCPS Clinical Pharmacist Pager [phone] 09/18/2020,10:17 AM

## 2020-09-18 NOTE — Progress Notes (Addendum)
ANTICOAGULATION CONSULT NOTE  Pharmacy Consult for Heparin Indication: chest pain/ACS  Allergies  Allergen Reactions  . Atorvastatin Calcium   . Crestor [Rosuvastatin Calcium]     Joint pain and weakness   . Lipitor [Atorvastatin]     Joint pain and weakness  . Other     Patient Measurements: Height: 5\' 9"  (175.3 cm) Weight: (!) 161.9 kg (357 lb) IBW/kg (Calculated) : 70.7 HEPARIN DW (KG): 110.4  Vital Signs: Temp: 98 F (36.7 C) (05/10 2123) Temp Source: Oral (05/10 2123) BP: 111/49 (05/10 1800) Pulse Rate: 104 (05/10 1800)  Labs: Recent Labs    09-30-2020 1322 09/30/20 1340 2020-09-30 1552 09/18/20 0027  HGB 14.3  --   --  13.3  HCT 45.9  --   --  41.9  PLT 232  --   --  241  HEPARINUNFRC  --   --   --  0.20*  CREATININE 1.22  --   --   --   TROPONINIHS  --  1,795* 2,006*  --     Estimated Creatinine Clearance: 85.4 mL/min (by C-G formula based on SCr of 1.22 mg/dL).  Assessment: 70 y.o. male with chest pain for heparin   Goal of Therapy:  Heparin level 0.3-0.7 units/ml Monitor platelets by anticoagulation protocol: Yes   Plan:  Heparin 2000 units IV bolus, then increase heparin 1700 units/hr Check heparin level in 8 hours.  66, PharmD, BCPS   09/18/2020,1:21 AM

## 2020-09-18 NOTE — Evaluation (Signed)
Occupational Therapy Evaluation Patient Details Name: Shaun Pugh. MRN: [id number] DOB: [birth year] Today's Date: 09/18/2020    History of Present Illness Shaun Pugh. is a 70 y.o. male with medical history significant for but not limited too history of acute pancreatitis, history of CAD with drug-eluting stent to the mid LAD and distal RCA back in 2005, carpal tunnel disease, chronic back pain, essential hypertension, history of nephrolithiasis, history of 3 CVAs, history of hydroureteronephrosis, history of MI in the past, Meibomian ganglion dysfunction as well as other comorbidities who presented to the emergency room with acute fall yesterday and complains of bilateral wrist pain or shoulder pain.  Patient is an extremely poor historian given his vascular dementia in the setting of his CVA and when asked what happened yesterday he he states that he fell but wife states that he was transferring from his wheelchair to the bed and he just slid down.  He came in because of wrist pain and then when he went to the scanner to get a CT scan he was laying flat and came back extremely pale and diaphoretic extremely short of breath.  Further work-up was done and showed that he had elevated troponin as well as elevated BNP and he was given IV Lasix.  Patient was placed on nasal cannula without improvement so he was transferred to a nonrebreather.  Patient still was not breathing very well so that he is placed on BiPAP with improvement.  Given his acute respiratory failure with hypoxia requiring noninvasive positive pressure ventilation and elevated BNP and elevated troponin TRH was asked to admit this patient.  Patient denies any chest pain but does admit to having some shortness of breath and no lightheadedness or dizziness.  Wife states that she has had urinary tract infection about 6 weeks ago.  Cardiology has been consulted and offer the patient aggressive measures and transfer to [facility] however he declined  these and so cardiology is recommending medical treatment with IV heparin and obtaining echocardiogram.   Clinical Impression   Pt agreeable to OT/PT co-evaluation. Demonstrates significant limitation in R UE use at baseline due to a previous stroke. Pt required Mod A for sit to stand and attempted stand pivot transfer but could not complete safely this date. Pt fatigued quickly and was assisted back to bed. Pt required total assist to don socks at bed level. Pt completed evaluation without supplemental O2 this date due to being off of O2 upon arrival. Pt will benefit from continued OT in the hospital and recommended venue elow to increase strength, balance, and endurance for safe ADL's.     Follow Up Recommendations  SNF    Equipment Recommendations  None recommended by OT           Precautions / Restrictions Precautions Precautions: Fall Restrictions Weight Bearing Restrictions: No      Mobility Bed Mobility Overal bed mobility: Needs Assistance Bed Mobility: Supine to Sit;Sit to Supine     Supine to sit: Max assist Sit to supine: Mod assist   General bed mobility comments: increased time, labored movement, LUE non-functional    Transfers Overall transfer level: Needs assistance Equipment used: Rolling walker (2 wheeled);1 person hand held assist Transfers: Sit to/from Stand Sit to Stand: Mod assist         General transfer comment: using right hand on RW, unsteady labored movement    Balance Overall balance assessment: Needs assistance Sitting-balance support: Feet supported;No upper extremity supported Sitting balance-Leahy Scale: Poor Sitting balance -  Comments: frequent leaning/falling backwards Postural control: Posterior lean Standing balance support: During functional activity;Single extremity supported Standing balance-Leahy Scale: Poor Standing balance comment: using RW with hand and hand held assist on left side                            ADL either performed or assessed with clinical judgement   ADL Overall ADL's : Needs assistance/impaired                     Lower Body Dressing: Total assistance;Bed level Lower Body Dressing Details (indicate cue type and reason): total assist to don socks at bed level                     Vision Baseline Vision/History:  (R lateral field cut in R eye from previous stroke.) Patient Visual Report: No change from baseline                  Pertinent Vitals/Pain Pain Assessment: No/denies pain     Hand Dominance Right   Extremity/Trunk Assessment Upper Extremity Assessment Upper Extremity Assessment: RUE deficits/detail;LUE deficits/detail RUE Deficits / Details: A/ROM WFL; generalized weakness as seen by pt diffiuclty with sit to stand and use of RW. RUE Sensation: WNL RUE Coordination: WNL LUE Deficits / Details: Significant limited due to previous stoke. ~10 to 15* shoulder A/ROM. ~90% of full A/ROM for elbow flexion; Full extension. ~50% of typicaly ROM composite digit extension. Limited use in functoinal tasks. Typically demonstrates a tight fist at rest. Washcloth placed in palm to reduce sweating and maintain skin integrity. LUE Coordination: decreased fine motor;decreased gross motor   Lower Extremity Assessment Lower Extremity Assessment: Defer to PT evaluation LLE Deficits / Details: grossly -3/5 LLE Sensation: WNL LLE Coordination: WNL;decreased gross motor   Cervical / Trunk Assessment Cervical / Trunk Assessment: Normal   Communication Communication Communication: No difficulties   Cognition Arousal/Alertness: Awake/alert Behavior During Therapy: WFL for tasks assessed/performed Overall Cognitive Status: Within Functional Limits for tasks assessed                                                      Home Living Family/patient expects to be discharged to:: Private residence Living Arrangements: Spouse/significant  other Available Help at Discharge: Family;Available 24 hours/day Type of Home: House Home Access: Stairs to enter [employer] of Steps: 3-4 Entrance Stairs-Rails: Right;Left;Can reach both Home Layout: One level     Bathroom Shower/Tub: Producer, television/film/video: Standard Bathroom Accessibility: Yes   Home Equipment: Cane - single point;Shower seat - built in;Wheelchair - manual;Bedside commode;Grab bars - tub/shower          Prior Functioning/Environment Level of Independence: Needs assistance  Gait / Transfers Assistance Needed: Assisted household ambulator using SPC ADL's / Homemaking Assistance Needed: assisted by family            OT Problem List: Decreased strength;Decreased range of motion;Decreased activity tolerance;Impaired balance (sitting and/or standing);Decreased coordination;Obesity;Impaired UE functional use      OT Treatment/Interventions: Self-care/ADL training;Therapeutic exercise;Neuromuscular education;DME and/or AE instruction;Manual therapy;Splinting;Therapeutic activities;Modalities;Patient/family education;Balance training    OT Goals(Current goals can be found in the care plan section) Acute Rehab OT Goals Patient Stated Goal: return home with family to assist OT Goal  Formulation: With patient Time For Goal Achievement: 10/02/20 Potential to Achieve Goals: Fair  OT Frequency: Min 2X/week   Barriers to D/C:            Co-evaluation PT/OT/SLP Co-Evaluation/Treatment: Yes Reason for Co-Treatment: Complexity of the patient's impairments (multi-system involvement) PT goals addressed during session: Mobility/safety with mobility;Proper use of DME;Balance OT goals addressed during session: Proper use of Adaptive equipment and DME;Strengthening/ROM                       End of Session Equipment Utilized During Treatment: Rolling walker (Pt not donning O2 upon arrival to session; triald functional activity without  O2.)  Activity Tolerance: Patient limited by fatigue Patient left: in bed;with call bell/phone within reach  OT Visit Diagnosis: Unsteadiness on feet (R26.81);Other abnormalities of gait and mobility (R26.89);History of falling (Z91.81);Muscle weakness (generalized) (M62.81);Hemiplegia and hemiparesis Hemiplegia - Right/Left: Left Hemiplegia - dominant/non-dominant: Non-Dominant Hemiplegia - caused by:  (prvious stroke)                Time: 3606-7703 OT Time Calculation (min): 21 min Charges:  OT General Charges $OT Visit: 1 Visit OT Evaluation $OT Eval Low Complexity: 1 Low  Radford Pease OT, MOT   Danie Chandler 09/18/2020, 12:09 PM

## 2020-09-18 NOTE — Progress Notes (Addendum)
Progress Note  Patient Name: Shaun Pugh. Date of Encounter: 09/18/2020  Primary Cardiologist: Nona Dell, MD   Subjective   Breathing at baseline. He denies any chest pain or palpitations. Says he was having more upper abdominal pain/epigastric region prior to admission.   Inpatient Medications    Scheduled Meds: . aspirin EC  325 mg Oral Daily  . Chlorhexidine Gluconate Cloth  6 each Topical Daily  . clopidogrel  75 mg Oral Daily  . insulin aspart  0-15 Units Subcutaneous TID WC  . insulin aspart  0-5 Units Subcutaneous QHS  . insulin glargine  30 Units Subcutaneous Daily  . lisinopril  10 mg Oral Daily  . pantoprazole  40 mg Oral Daily  . senna  1 tablet Oral BID  . vitamin B-12  1,000 mcg Oral Daily   Continuous Infusions: . cefTRIAXone (ROCEPHIN)  IV Stopped (09/26/2020 1722)  . heparin 1,700 Units/hr (09/18/20 0618)   PRN Meds: acetaminophen **OR** acetaminophen, ondansetron (ZOFRAN) IV, oxyCODONE   Vital Signs    Vitals:   09/18/20 0300 09/18/20 0400 09/18/20 0422 09/18/20 0742  BP: (!) 97/57 (!) 96/59    Pulse:      Resp: 17 20  20   Temp:   97.6 F (36.4 C) (!) 97.1 F (36.2 C)  TempSrc:   Oral Oral  SpO2: 91% 92%    Weight:   116.8 kg   Height:        Intake/Output Summary (Last 24 hours) at 09/18/2020 0836 Last data filed at 09/18/2020 0423 Gross per 24 hour  Intake 307.12 ml  Output 275 ml  Net 32.12 ml    Last 3 Weights 09/18/2020 09/09/2020 09/26/2020  Weight (lbs) 257 lb 8 oz 357 lb 357 lb  Weight (kg) 116.8 kg 161.934 kg 161.934 kg      Telemetry    NSR, HR in 80's to 90's.  - Personally Reviewed  ECG    NSR, HR 98 with RBBB and ST depression along the inferior leads and ST elevation along AVR. - Personally Reviewed  Physical Exam   General: Well developed, well nourished male appearing in no acute distress. Head: Normocephalic, atraumatic.  Neck: Supple without bruits, JVD at 8 cm. Lungs:  Resp regular and unlabored, mildly  decreased breath sounds along bases. Heart: RRR, S1, S2, no S3, S4, or murmur; no rub. Abdomen: Soft, non-tender, non-distended with normoactive bowel sounds. No hepatomegaly. No rebound/guarding. No obvious abdominal masses. Extremities: No clubbing, cyanosis, or pitting edema. Distal pedal pulses are 2+ bilaterally. Neuro: Alert and oriented X 3. Left hemiparesis. Psych: Normal affect.  Labs    Chemistry Recent Labs  Lab 09/13/2020 1322 09/18/20 0027  NA 136 139  K 4.3 4.6  CL 104 105  CO2 20* 22  GLUCOSE 188* 214*  BUN 21 24*  CREATININE 1.22 1.61*  CALCIUM 9.3 9.1  PROT 8.8* 8.1  ALBUMIN 4.3 4.0  AST 38 83*  ALT 21 25  ALKPHOS 62 56  BILITOT 0.9 1.0  GFRNONAA >60 46*  ANIONGAP 12 12     Hematology Recent Labs  Lab 09/19/2020 1322 09/18/20 0027  WBC 20.9* 19.8*  RBC 5.03 4.58  HGB 14.3 13.3  HCT 45.9 41.9  MCV 91.3 91.5  MCH 28.4 29.0  MCHC 31.2 31.7  RDW 16.4* 16.7*  PLT 232 241    BNP Recent Labs  Lab 10/02/2020 1340  BNP 731.0*     Radiology    DG Chest 1 View  Result  Date: 2020/10/05 CLINICAL DATA:  Fall.  Bilateral shoulder pain. EXAM: CHEST  1 VIEW COMPARISON:  10/24/2019 FINDINGS: The cardio pericardial silhouette is enlarged. There is pulmonary vascular congestion without overt pulmonary edema. Bibasilar atelectasis or infiltrate noted, right greater than left. Bones are diffusely demineralized. Telemetry leads overlie the chest. IMPRESSION: 1. Enlargement of the cardiopericardial silhouette with pulmonary vascular congestion. 2. Bibasilar atelectasis or infiltrate, right greater than left. Electronically Signed   By: Kennith Center M.D.   On: Oct 05, 2020 13:46   DG Shoulder Right  Result Date: 2020-10-05 CLINICAL DATA:  Right shoulder pain after fall. EXAM: RIGHT SHOULDER - 2+ VIEW COMPARISON:  None. FINDINGS: There is no evidence of fracture or dislocation. There is no evidence of arthropathy or other focal bone abnormality. Soft tissues are  unremarkable. IMPRESSION: Negative. Electronically Signed   By: Obie Dredge M.D.   On: 05-Oct-2020 13:47   DG Wrist Complete Left  Result Date: 05-Oct-2020 CLINICAL DATA:  Left wrist pain.  No known injury. EXAM: LEFT WRIST - COMPLETE 3+ VIEW COMPARISON:  None. FINDINGS: There is no evidence of fracture or dislocation. Mild first CMC osteoarthritis noted. Soft tissues are unremarkable. IMPRESSION: Mild first CMC osteoarthritis.  Otherwise negative. Electronically Signed   By: Drusilla Kanner M.D.   On: 10/05/20 13:47   DG Wrist Complete Right  Result Date: 05-Oct-2020 CLINICAL DATA:  Right wrist pain.  No known injury. EXAM: RIGHT WRIST - COMPLETE 3+ VIEW COMPARISON:  None. FINDINGS: There is no evidence of fracture or dislocation. There is no evidence of arthropathy or other focal bone abnormality. Soft tissues are unremarkable. IMPRESSION: Negative exam. Electronically Signed   By: Drusilla Kanner M.D.   On: 10/05/20 13:46   CT Head Wo Contrast  Result Date: Oct 05, 2020 CLINICAL DATA:  Neck trauma (Age >= 65y); Mental status change, unknown cause, fell onto left side last night at home, denies head injury and loss of consciousness EXAM: CT HEAD WITHOUT CONTRAST CT CERVICAL SPINE WITHOUT CONTRAST TECHNIQUE: Multidetector CT imaging of the head and cervical spine was performed following the standard protocol without intravenous contrast. Multiplanar CT image reconstructions of the cervical spine were also generated. COMPARISON:  CT a head/neck 03/16/2016, MRI head report from 03/14/2016 FINDINGS: CT HEAD FINDINGS Brain: There is encephalomalacia and in the right frontal, right parietal, temporal, and occipital lobes compatible with old infarct. There is also encephalomalacia in the left occipital parietal lobe compatible with prior PCA infarct. Unchanged size of the ventricular system with exception of and ex vacuo dilation of the left posterior horn due to now chronic left PCA infarct. Unchanged  small chronic infarcts in the cerebellum. There is no new loss of gray-white matter differentiation identified on noncontrast CT. Vascular: No hyperdense vessel or unexpected calcification. Skull: Normal. Negative for fracture or focal lesion. Sinuses/Orbits: There is a small left mastoid effusion. The paranasal sinuses are predominantly clear. The orbits are unremarkable. Other: None CT CERVICAL SPINE FINDINGS Mildly motion degraded exam of the cervical spine. Alignment: Straightening of the normal cervical lordosis, likely due to patient positioning or discomfort. Skull base and vertebrae: There is no evidence of acute fracture. Soft tissues and spinal canal: No prevertebral fluid or swelling. No visible canal hematoma. Disc levels: There is degenerative disc disease at C3-C4 with small posterior disc osteophyte complex. Upper chest: Negative Other: None IMPRESSION: Multifocal encephalomalacia in the cerebral and cerebellar hemispheres, right greater than left, compatible with old infarcts as seen on prior imaging in 2017. No acute intracranial  hemorrhage or evidence of new large territory infarct. If there is concern for new acute infarct, with recommend MRI. No evidence of acute cervical spine fracture. Degenerative disc disease at C3-C4 with small posterior disc osteophyte complex. Electronically Signed   By: Caprice Renshaw   On: 2020-10-07 13:34   CT Cervical Spine Wo Contrast  Result Date: Oct 07, 2020 CLINICAL DATA:  Neck trauma (Age >= 65y); Mental status change, unknown cause, fell onto left side last night at home, denies head injury and loss of consciousness EXAM: CT HEAD WITHOUT CONTRAST CT CERVICAL SPINE WITHOUT CONTRAST TECHNIQUE: Multidetector CT imaging of the head and cervical spine was performed following the standard protocol without intravenous contrast. Multiplanar CT image reconstructions of the cervical spine were also generated. COMPARISON:  CT a head/neck 03/16/2016, MRI head report from  03/14/2016 FINDINGS: CT HEAD FINDINGS Brain: There is encephalomalacia and in the right frontal, right parietal, temporal, and occipital lobes compatible with old infarct. There is also encephalomalacia in the left occipital parietal lobe compatible with prior PCA infarct. Unchanged size of the ventricular system with exception of and ex vacuo dilation of the left posterior horn due to now chronic left PCA infarct. Unchanged small chronic infarcts in the cerebellum. There is no new loss of gray-white matter differentiation identified on noncontrast CT. Vascular: No hyperdense vessel or unexpected calcification. Skull: Normal. Negative for fracture or focal lesion. Sinuses/Orbits: There is a small left mastoid effusion. The paranasal sinuses are predominantly clear. The orbits are unremarkable. Other: None CT CERVICAL SPINE FINDINGS Mildly motion degraded exam of the cervical spine. Alignment: Straightening of the normal cervical lordosis, likely due to patient positioning or discomfort. Skull base and vertebrae: There is no evidence of acute fracture. Soft tissues and spinal canal: No prevertebral fluid or swelling. No visible canal hematoma. Disc levels: There is degenerative disc disease at C3-C4 with small posterior disc osteophyte complex. Upper chest: Negative Other: None IMPRESSION: Multifocal encephalomalacia in the cerebral and cerebellar hemispheres, right greater than left, compatible with old infarcts as seen on prior imaging in 2017. No acute intracranial hemorrhage or evidence of new large territory infarct. If there is concern for new acute infarct, with recommend MRI. No evidence of acute cervical spine fracture. Degenerative disc disease at C3-C4 with small posterior disc osteophyte complex. Electronically Signed   By: Caprice Renshaw   On: 10/07/2020 13:34   DG Shoulder Left  Result Date: Oct 07, 2020 CLINICAL DATA:  Left shoulder pain. EXAM: LEFT SHOULDER - 2+ VIEW COMPARISON:  None. FINDINGS: No  acute fracture or dislocation identified. Bones are osteopenic. Mild degenerative disease of the glenohumeral joint and proximal humerus. No bony lesions or destruction. IMPRESSION: No acute findings.  Mild degenerative disease of the shoulder. Electronically Signed   By: Irish Lack M.D.   On: 10-07-2020 13:47    Cardiac Studies   Echocardiogram: 03/16/2016 Study Conclusions   - Left ventricle: Poorly visualized. The cavity size was normal.  Wall thickness was increased in a pattern of moderate LVH.  Systolic function was normal. The estimated ejection fraction was  in the range of 50% to 55%. Doppler parameters are consistent  with abnormal left ventricular relaxation (grade 1 diastolic  dysfunction).   Patient Profile     70 y.o. male w/ PMH of CAD (s/p DES to mid-LAD and DES to distal RCA in 2005, NST in 2018 showing areas of mild to moderate ischemia with medical management pursued), carotid artery stenosis (s/p R CEA in 08/2016), HTN, HLD, IDDM  and multiple CVA's (with left hemiparesis) who presented to [facility] on 09/27/2020 after suffering a fall but had an episode of dyspnea and left arm pain while having a CT Scan --> found to have an NSTEMI.   Assessment & Plan    1. NSTEMI - He reports epigastric pain at the time of admission but no recurrent symptoms. Breathing at baseline. Hs Troponin up to 2006 this admission and EKG showing ST depression along the inferior leads and ST elevation along AVR. Per discussions with family yesterday, they are not interested in a cardiac catheterization and prefer medical management at this time. He has been on Heparin for 24 hours and would continue today with plans to stop tomorrow. Continue ASA and Plavix. Will add Lopressor 12.5mg  BID. Intolerant to statins but could add Zetia. Additional recommendations pending echo results.  - BNP at 731 on admission and he received IV Lasix 40mg . He reports his breathing is at baseline. Will hold  on additional IV Lasix for now and reassess in AM.   2. CAD - He is s/p DES to mid-LAD and DES to distal RCA in 2005 with most recent ischemic evaluation being a NST in 2018 showing areas of mild to moderate ischemia with medical management pursued. - Currently admitted with an NSTEMI and plans for medical management as outlined above.  - Continue ASA (on 325mg  daily per Vascular) and Plavix. Add BB as outlined above.   3. Carotid Artery Stenosis - Followed by Vascular Surgery as an outpatient given his prior R CEA in 08/2016. Remains on ASA and Plavix. Statin intolerant as outlined below.   4. HTN - BP has been variable at 92/72 - 134/87 within the past 24 hours. Currently on Lisinopril 10mg  daily. Will hold for now to allow for the addition of BB therapy in the setting of his NSTEMI. Would consider adding back at a lower dose pending BP and renal response.   5. HLD - FLP this admission shows total cholesterol of 192, triglycerides 229, HDL 29 and LDL 117. By review of notes, he has been intolerant to multiple statins. Would consider referral to the [facility] as an outpatient (perhaps telehealth given his mobility issues) or at least the addition of Zetia 10mg  daily.   6. Type 2 DM - Hgb A1c at 8.1 this admission. Further management per admitting team.   7. AKI - Creatinine at 1.22 on admission, elevated to 1.61 today. Will hold Lisinopril and additional IV Lasix for now.    For questions or updates, please contact [facility] Please consult [email or web address] for contact info under Cardiology/STEMI.   Lorri FrederickSigned, Brittany M Strader , PA-C 8:36 AM 09/18/2020 Pager: [phone]   Attending note:  Patient seen and examined.  I reviewed the full consultation note from yesterday, discussed case with Ms. Iran OuchStrader PA-C, I agree with her above findings.  Mr. Kingsley Callanderngers reports some recurrent chest discomfort this morning.  His ECG from yesterday is concerning for global ischemia, possibly left  main or multivessel disease.  He has declined cardiac catheterization and we are managing him conservatively.  Presently with DNR status.  On examination he is in no distress.  He is afebrile, heart rate in the 80s to 90s in sinus rhythm, systolic blood pressure ranging 80 to low 100s.  Lungs exhibit scattered rhonchi.  Cardiac exam with RRR and soft systolic murmur, no gallop.  He has residual deficits from previous strokes and is largely bedbound.  Pertinent lab work includes potassium 4.6, BUN  24, creatinine 1.61, AST 83, ALT 25, high-sensitivity troponin I 2006, LDL 117, WBC 19.8, hemoglobin 13.3, platelets 241, hemoglobin A1c 8.1%.  Chest x-ray reports enlarged cardiac silhouette with pulmonary vascular congestion and bibasilar atelectasis.  Would continue aspirin and Plavix, starting low-dose Lopressor if blood pressure tolerates, hold ACE inhibitor at the present time.  Otherwise continue IV heparin for minimum of 48 hours.  Echocardiogram is pending for assessment of LVEF as this may help to guide further medication adjustments.  Overall prognosis is poor.  Jonelle Sidle, M.D., F.A.C.C.

## 2020-09-18 NOTE — TOC Initial Note (Addendum)
Transition of Care (TOC) - Initial/Assessment Note    Patient Details  Name: Shaun Pugh. MRN: [id number] Date of Birth: 1951-02-07  Transition of Care [facility]) CM/SW Contact:    Salome Arnt, LCSW Phone Number: 09/18/2020, 12:53 PM  Clinical Narrative:   Pt admitted due to acute respiratory failure with hypoxia and elevated troponin. TOC consulted for CHF screening and SNF placement. LCSW met with pt and pt's wife at bedside to complete assessment.  Pt reports his wife assists him with all ADLs at home. He is able to transfer with assist. Wife indicates he is active with [facility] and OT. PT evaluated pt and recommend SNF. LCSW discussed placement process with pt and wife. They state they would like to discuss together further and TOC will follow up in AM for decision.   CHF screening completed. Pt and wife state they were told here that CHF was in pt's history, but they were never told about diagnosis in the past. Pt is unable to stand for daily weights. When asked about heart healthy diet, pt's wife said he enjoys a lot of salt on his food. LCSW provided education and will order CHF book. Pt takes medications "most of the time" as prescribed.                    Barriers to Discharge: Continued Medical Work up   Patient Goals and CMS Choice Patient states their goals for this hospitalization and ongoing recovery are:: unsure at this time   Choice offered to / list presented to : [facility]  Expected Discharge Plan and Services   In-house Referral: Clinical Social Work     Living arrangements for the past 2 months: Single Family Home                 DME Arranged: N/A DME Agency: NA                  Prior Living Arrangements/Services Living arrangements for the past 2 months: Single Family Home Lives with:: Spouse Patient language and need for interpreter reviewed:: Yes Do you feel safe going back to the place where you live?: Yes       Need for Family Participation in Patient Care: Yes (Comment) Care giver support system in place?: Yes (comment) Current home services: DME,Home PT (wheelchair, lift, tub bench) Criminal Activity/Legal Involvement Pertinent to Current Situation/Hospitalization: No - Comment as needed  Activities of Daily Living Home Assistive Devices/Equipment: CBG Meter,Dentures (specify type),Wheelchair ADL Screening (condition at time of admission) Patient's cognitive ability adequate to safely complete daily activities?: Yes Is the patient deaf or have difficulty hearing?: No Does the patient have difficulty seeing, even when wearing glasses/contacts?: No Does the patient have difficulty concentrating, remembering, or making decisions?: No Patient able to express need for assistance with ADLs?: Yes Does the patient have difficulty dressing or bathing?: No Independently performs ADLs?: No Communication: Independent Dressing (OT): Needs assistance Is this a change from baseline?: Pre-admission baseline Grooming: Needs assistance Is this a change from baseline?: Pre-admission baseline Feeding: Independent Bathing: Needs assistance Is this a change from baseline?: Pre-admission baseline Toileting: Needs assistance Is this a change from baseline?: Pre-admission baseline In/Out Bed: Needs assistance Is this a change from baseline?: Pre-admission baseline Walks in Home: Needs assistance Is this a change from baseline?: Pre-admission baseline Does the patient have difficulty walking or climbing stairs?: Yes Weakness of Legs: Both Weakness of Arms/Hands: None  Permission Sought/Granted  Emotional Assessment   Attitude/Demeanor/Rapport: Engaged Affect (typically observed): Accepting Orientation: : Oriented to Self,Oriented to Place,Oriented to  Time,Oriented to Situation Alcohol / Substance Use: Not Applicable Psych Involvement: No (comment)  Admission diagnosis:  Elevated  troponin [R77.8] Acute cystitis without hematuria [N30.00] Acute respiratory failure with hypoxia ([place]) [J96.01] Fall, initial encounter [W19.XXXA] AMS (altered mental status) [R41.82] Patient Active Problem List   Diagnosis Date Noted  . Pressure injury of skin 09/18/2020  . Acute respiratory failure with hypoxia ([place]) 09/28/2020  . Elevated troponin 09/12/2020  . S/P carotid endarterectomy 11/04/2016  . Symptomatic carotid artery stenosis, right 09/04/2016  . History of stroke 06/24/2016  . 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 Stenosis of right carotid artery 06/17/2016  . Blurry vision, bilateral   . CVA (cerebral vascular accident) ([place]) 03/13/2016  . Diabetes mellitus ([place]) 03/13/2016  . Essential hypertension 03/13/2016  . Other hyperlipidemia 03/13/2016  . Muscle weakness (generalized) 05/31/2012  . Difficulty walking 05/31/2012  . Lack of coordination 05/31/2012  . Cytotoxic cerebral edema ([place]) 03/19/2012  . Aortic embolism or thrombosis ([place]) 03/19/2012  . Cerebrovascular accident (CVA) due to embolism of left posterior cerebral artery ([place]) 03/18/2012  . Seizure ([place]) 03/18/2012  . Stented coronary artery   . Hemiplegia affecting nondominant side ([place]) 03/17/2012  . Atrophy of right kidney 09/15/2011  . Kidney calculi 09/15/2011  . Acute pancreatitis 09/14/2011  . Cholelithiasis 09/14/2011  . Hyperglycemia 09/14/2011   PCP:  Celene Squibb, MD Pharmacy:   [facility] - [place], [place] - [address] [place] [address] Phone: [phone] Fax: [phone]  [facility] [address] [place] - [place] [address] [place] [address] [place] [address] Phone: [phone] Fax: [phone]     Social Determinants of Health (SDOH) Interventions    Readmission Risk Interventions No flowsheet data found.

## 2020-09-18 NOTE — Evaluation (Signed)
Physical Therapy Evaluation Patient Details Name: Shaun Pugh. MRN: [id number] DOB: [birth year] Today's Date: 09/18/2020   History of Present Illness  Shaun Pugh. is a 70 y.o. male with medical history significant for but not limited too history of acute pancreatitis, history of CAD with drug-eluting stent to the mid LAD and distal RCA back in 2005, carpal tunnel disease, chronic back pain, essential hypertension, history of nephrolithiasis, history of 3 CVAs, history of hydroureteronephrosis, history of MI in the past, Meibomian ganglion dysfunction as well as other comorbidities who presented to the emergency room with acute fall yesterday and complains of bilateral wrist pain or shoulder pain.  Patient is an extremely poor historian given his vascular dementia in the setting of his CVA and when asked what happened yesterday he he states that he fell but wife states that he was transferring from his wheelchair to the bed and he just slid down.  He came in because of wrist pain and then when he went to the scanner to get a CT scan he was laying flat and came back extremely pale and diaphoretic extremely short of breath.  Further work-up was done and showed that he had elevated troponin as well as elevated BNP and he was given IV Lasix.  Patient was placed on nasal cannula without improvement so he was transferred to a nonrebreather.  Patient still was not breathing very well so that he is placed on BiPAP with improvement.  Given his acute respiratory failure with hypoxia requiring noninvasive positive pressure ventilation and elevated BNP and elevated troponin TRH was asked to admit this patient.  Patient denies any chest pain but does admit to having some shortness of breath and no lightheadedness or dizziness.  Wife states that she has had urinary tract infection about 6 weeks ago.  Cardiology has been consulted and offer the patient aggressive measures and transfer to [facility] however he declined these  and so cardiology is recommending medical treatment with IV heparin and obtaining echocardiogram.    Clinical Impression  Patient demonstrates slow labored movement for sitting up at bedside requiring Max assist due to frequent leaning/falling backwards and LUE non-functional.  Patient able to stand using RW with RUE, limited to a few side steps before having to sit due to weakness/fatigue and had episode of nausea with vomiting.  Patient unable to transfer to chair due to fall risk and put back to bed - RN notified concerning nausea/vomiting.  Patient will benefit from continued physical therapy in hospital and recommended venue below to increase strength, balance, endurance for safe ADLs and gait.     Follow Up Recommendations SNF    Equipment Recommendations  None recommended by PT    Recommendations for Other Services       Precautions / Restrictions Precautions Precautions: Fall Restrictions Weight Bearing Restrictions: No      Mobility  Bed Mobility Overal bed mobility: Needs Assistance Bed Mobility: Supine to Sit;Sit to Supine     Supine to sit: Max assist Sit to supine: Mod assist   General bed mobility comments: increased time, labored movement, LUE non-functional    Transfers Overall transfer level: Needs assistance Equipment used: Rolling walker (2 wheeled);1 person hand held assist Transfers: Sit to/from Stand Sit to Stand: Mod assist         General transfer comment: using right hand on RW, unsteady labored movement  Ambulation/Gait Ambulation/Gait assistance: Max assist Gait Distance (Feet): 3 Feet Assistive device: Rolling walker (2 wheeled) Gait Pattern/deviations: Decreased  step length - right;Decreased step length - left;Decreased stance time - left;Shuffle Gait velocity: decreased   General Gait Details: very unsteady on feet and limited to 2-3 side steps leaning on RW with RUE, unable to transfer to chair due to BLE weakness and fatigue, on room  air with SpO2 at 91%  Stairs            Wheelchair Mobility    Modified Rankin (Stroke Patients Only)       Balance Overall balance assessment: Needs assistance Sitting-balance support: Feet supported;No upper extremity supported Sitting balance-Leahy Scale: Poor Sitting balance - Comments: frequent leaning/falling backwards Postural control: Posterior lean Standing balance support: During functional activity;Single extremity supported Standing balance-Leahy Scale: Poor Standing balance comment: using RW with hand and hand held assist on left side                             Pertinent Vitals/Pain Pain Assessment: No/denies pain    Home Living Family/patient expects to be discharged to:: Private residence Living Arrangements: Spouse/significant other Available Help at Discharge: Family;Available 24 hours/day Type of Home: House Home Access: Stairs to enter Entrance Stairs-Rails: Right;Left;Can reach both Entrance Stairs-Number of Steps: 3-4 Home Layout: One level Home Equipment: Cane - single point;Shower seat - built in;Wheelchair - manual;Bedside commode;Grab bars - tub/shower      Prior Function Level of Independence: Needs assistance   Gait / Transfers Assistance Needed: Assisted household ambulator using SPC  ADL's / Homemaking Assistance Needed: assisted by family        Hand Dominance   Dominant Hand: Right    Extremity/Trunk Assessment   Upper Extremity Assessment Upper Extremity Assessment: Defer to OT evaluation    Lower Extremity Assessment Lower Extremity Assessment: Generalized weakness;LLE deficits/detail LLE Deficits / Details: grossly -3/5 LLE Sensation: WNL LLE Coordination: WNL;decreased gross motor    Cervical / Trunk Assessment Cervical / Trunk Assessment: Normal  Communication   Communication: No difficulties  Cognition Arousal/Alertness: Awake/alert Behavior During Therapy: WFL for tasks  assessed/performed Overall Cognitive Status: Within Functional Limits for tasks assessed                                        General Comments      Exercises     Assessment/Plan    PT Assessment Patient needs continued PT services  PT Problem List Decreased strength;Decreased activity tolerance;Decreased balance;Decreased mobility       PT Treatment Interventions DME instruction;Gait training;Stair training;Functional mobility training;Therapeutic activities;Therapeutic exercise;Patient/family education;Balance training    PT Goals (Current goals can be found in the Care Plan section)  Acute Rehab PT Goals Patient Stated Goal: return home with family to assist PT Goal Formulation: With patient Time For Goal Achievement: 10/02/20 Potential to Achieve Goals: Good    Frequency Min 3X/week   Barriers to discharge        Co-evaluation PT/OT/SLP Co-Evaluation/Treatment: Yes Reason for Co-Treatment: To address functional/ADL transfers;For patient/therapist safety PT goals addressed during session: Mobility/safety with mobility;Proper use of DME;Balance         AM-PAC PT "6 Clicks" Mobility  Outcome Measure Help needed turning from your back to your side while in a flat bed without using bedrails?: A Lot Help needed moving from lying on your back to sitting on the side of a flat bed without using bedrails?: A Lot Help needed  moving to and from a bed to a chair (including a wheelchair)?: A Lot Help needed standing up from a chair using your arms (e.g., wheelchair or bedside chair)?: A Lot Help needed to walk in hospital room?: A Lot Help needed climbing 3-5 steps with a railing? : Total 6 Click Score: 11    End of Session   Activity Tolerance: Patient tolerated treatment well;Patient limited by fatigue Patient left: in bed;with call bell/phone within reach Nurse Communication: Mobility status PT Visit Diagnosis: Unsteadiness on feet (R26.81);Other  abnormalities of gait and mobility (R26.89);Muscle weakness (generalized) (M62.81)    Time: 7829-5621 PT Time Calculation (min) (ACUTE ONLY): 25 min   Charges:   PT Evaluation $PT Eval Moderate Complexity: 1 Mod PT Treatments $Therapeutic Activity: 23-37 mins        9:55 AM, 09/18/20 Ocie Bob, MPT Physical Therapist with [facility] 336 [phone] office [phone] mobile phone

## 2020-09-18 NOTE — Progress Notes (Signed)
ANTICOAGULATION CONSULT NOTE -  Pharmacy Consult for Heparin Indication: chest pain/ACS  Allergies  Allergen Reactions  . Atorvastatin Calcium   . Crestor [Rosuvastatin Calcium]     Joint pain and weakness   . Lipitor [Atorvastatin]     Joint pain and weakness  . Other     Patient Measurements: Height: 5\' 9"  (175.3 cm) Weight: 116.8 kg (257 lb 8 oz) IBW/kg (Calculated) : 70.7 HEPARIN DW (KG): 110.4  Vital Signs: Temp: 97.8 F (36.6 C) (05/11 1605) Temp Source: Oral (05/11 1605) BP: 93/54 (05/11 1500) Pulse Rate: 90 (05/11 1605)  Labs: Recent Labs    09/29/2020 1322 09/30/2020 1340 09/12/2020 1552 09/18/20 0027 09/18/20 0838 09/18/20 1611  HGB 14.3  --   --  13.3  --   --   HCT 45.9  --   --  41.9  --   --   PLT 232  --   --  241  --   --   HEPARINUNFRC  --   --   --  0.20* 0.42 0.30  CREATININE 1.22  --   --  1.61*  --   --   TROPONINIHS  --  1,795* 2,006*  --   --   --     Estimated Creatinine Clearance: 53.8 mL/min (A) (by C-G formula based on SCr of 1.61 mg/dL (H)).   Medical History: Past Medical History:  Diagnosis Date  . Acute pancreatitis 09/14/2011  . Atrophy of right kidney 09/15/2011  . CAD (coronary artery disease)    DES to mid LAD and distal RCA 2005 - Dr. 2006  . Carotid artery disease (HCC)   . Carpal tunnel syndrome, bilateral   . Chronic back pain   . Essential hypertension   . GERD (gastroesophageal reflux disease)   . History of gallstones   . History of kidney stones   . History of stroke 03/18/2012   Aortic atheroma implicated, was on Coumadin for a period of time and more recently aspirin and Plavix  . History of stroke 03/2016   Left occipital lobe PCA infarct, residual partial loss of visual fields  . History of stroke 11/2011   Large right MCA, residual left hemiparesis  . History of toe fracture   . Hydroureteronephrosis 05/20/2013   Mod to severe right, mild perinephric stranding secondary to 10x7 mm calculus distal right  ureter  . Hyperlipidemia   . Iliac artery aneurysm (HCC) 09/14/2011   Right common, nonaneurysmal dilatation of the infrrenal aorta, noted on CT abd/pelvis  . Meibomian gland dysfunction   . Myocardial infarction (HCC)   . Pneumonia 03/2012  . Retinal artery branch occlusion 11/2011   Secondary to thromboembolic disease, lost vision in Left eye  . Seizures (HCC)    Following stroke  . Senile cataracts of both eyes   . Sleep apnea    Reportedly intolerant of CPAP  . Type 2 diabetes mellitus (HCC)   . Wears dentures     Medications:  See med rec  Assessment: Pt presents to the ED today with right and left wrist pain from a fall.  Pt has a hx of multiple strokes and has baseline difficulty ambulating.  He said he fell backwards and hurt both wrists and shoulders.  Pt is on plavix, but denies hitting his head. Patient also having SOB and diahphoresis.   He says he's had chest pressure for several days.BNP 731, Troponin 1795, EKG NSR with RBBB diffuse ST depression and ST elevation avR . Patient  with NSTEMI/Pulmonary edema He's had 4-5 strokes and is bedridden and cared for by his wife. Pharmacy asked to start heparin. Not on oral DOACS  HL 0.42, therapeutic HL 0.30, therapeutic x 2 consecutive checks  Goal of Therapy:  Heparin level 0.3-0.7 units/ml Monitor platelets by anticoagulation protocol: Yes   Plan:  Continue heparin infusion at 1700 units/hr Check anti-Xa level daily while on heparin Continue to monitor H&H and platelets  Valrie Hart, PharmD Clinical Pharmacist Pager [phone] 09/18/2020,5:20 PM

## 2020-09-18 NOTE — Plan of Care (Signed)
  Problem: Clinical Measurements: Goal: Ability to maintain clinical measurements within normal limits will improve Outcome: Progressing Goal: Will remain free from infection Outcome: Progressing Goal: Diagnostic test results will improve Outcome: Progressing Goal: Respiratory complications will improve Outcome: Progressing Goal: Cardiovascular complication will be avoided Outcome: Progressing   Problem: Activity: Goal: Risk for activity intolerance will decrease Outcome: Progressing   Problem: Safety: Goal: Ability to remain free from injury will improve Outcome: Progressing   Problem: Skin Integrity: Goal: Risk for impaired skin integrity will decrease Outcome: Progressing   

## 2020-09-19 DIAGNOSIS — I959 Hypotension, unspecified: Secondary | ICD-10-CM

## 2020-09-19 DIAGNOSIS — I214 Non-ST elevation (NSTEMI) myocardial infarction: Secondary | ICD-10-CM | POA: Diagnosis not present

## 2020-09-19 LAB — BASIC METABOLIC PANEL
Anion gap: 10 (ref 5–15)
BUN: 46 mg/dL — ABNORMAL HIGH (ref 8–23)
CO2: 20 mmol/L — ABNORMAL LOW (ref 22–32)
Calcium: 8.8 mg/dL — ABNORMAL LOW (ref 8.9–10.3)
Chloride: 104 mmol/L (ref 98–111)
Creatinine, Ser: 2.51 mg/dL — ABNORMAL HIGH (ref 0.61–1.24)
GFR, Estimated: 27 mL/min — ABNORMAL LOW (ref >60)
Glucose, Bld: 195 mg/dL — ABNORMAL HIGH (ref 70–99)
Potassium: 4.4 mmol/L (ref 3.5–5.1)
Sodium: 134 mmol/L — ABNORMAL LOW (ref 135–145)

## 2020-09-19 LAB — HEPATIC FUNCTION PANEL
ALT: 26 U/L (ref 0–44)
AST: 78 U/L — ABNORMAL HIGH (ref 15–41)
Albumin: 3.6 g/dL (ref 3.5–5.0)
Alkaline Phosphatase: 49 U/L (ref 38–126)
Bilirubin, Direct: 0.2 mg/dL (ref 0.0–0.2)
Indirect Bilirubin: 0.8 mg/dL (ref 0.3–0.9)
Total Bilirubin: 1 mg/dL (ref 0.3–1.2)
Total Protein: 7.9 g/dL (ref 6.5–8.1)

## 2020-09-19 LAB — CBC
HCT: 36.6 % — ABNORMAL LOW (ref 39.0–52.0)
Hemoglobin: 11.7 g/dL — ABNORMAL LOW (ref 13.0–17.0)
MCH: 29.3 pg (ref 26.0–34.0)
MCHC: 32 g/dL (ref 30.0–36.0)
MCV: 91.5 fL (ref 80.0–100.0)
Platelets: 175 10*3/uL (ref 150–400)
RBC: 4 MIL/uL — ABNORMAL LOW (ref 4.22–5.81)
RDW: 16.6 % — ABNORMAL HIGH (ref 11.5–15.5)
WBC: 18.2 10*3/uL — ABNORMAL HIGH (ref 4.0–10.5)
nRBC: 0 % (ref 0.0–0.2)

## 2020-09-19 LAB — TROPONIN I (HIGH SENSITIVITY): Troponin I (High Sensitivity): 12915 ng/L (ref <18)

## 2020-09-19 LAB — HEPARIN LEVEL (UNFRACTIONATED)
Heparin Unfractionated: 0.21 IU/mL — ABNORMAL LOW (ref 0.30–0.70)
Heparin Unfractionated: 0.28 IU/mL — ABNORMAL LOW (ref 0.30–0.70)

## 2020-09-19 LAB — GLUCOSE, CAPILLARY
Glucose-Capillary: 241 mg/dL — ABNORMAL HIGH (ref 70–99)
Glucose-Capillary: 217 mg/dL — ABNORMAL HIGH (ref 70–99)
Glucose-Capillary: 185 mg/dL — ABNORMAL HIGH (ref 70–99)
Glucose-Capillary: 173 mg/dL — ABNORMAL HIGH (ref 70–99)

## 2020-09-19 MED ORDER — ALPRAZOLAM 0.5 MG PO TABS
0.5000 mg | ORAL_TABLET | Freq: Three times a day (TID) | ORAL | Status: DC | PRN
  Administered 2020-09-19 – 2020-09-20 (×2): 0.5 mg via ORAL
  Filled 2020-09-19 (×2): qty 1

## 2020-09-19 MED ORDER — SODIUM CHLORIDE 0.9 % IV SOLN: INTRAVENOUS | Status: AC

## 2020-09-19 MED ORDER — OXYCODONE HCL 5 MG PO TABS
5.0000 mg | ORAL_TABLET | Freq: Four times a day (QID) | ORAL | Status: DC | PRN
Start: 2020-09-19 — End: 2020-09-20

## 2020-09-19 MED ORDER — METOPROLOL SUCCINATE ER 25 MG PO TB24
12.5000 mg | ORAL_TABLET | Freq: Every day | ORAL | Status: DC
  Administered 2020-09-19: 12.5 mg via ORAL
  Filled 2020-09-19 (×2): qty 1

## 2020-09-19 MED ORDER — HALOPERIDOL LACTATE 5 MG/ML IJ SOLN
5.0000 mg | Freq: Once | INTRAMUSCULAR | Status: AC
  Administered 2020-09-19: 5 mg via INTRAVENOUS
  Filled 2020-09-19: qty 1

## 2020-09-19 MED ORDER — LIVING BETTER WITH HEART FAILURE BOOK: Freq: Once | Status: AC

## 2020-09-19 MED ORDER — MIDODRINE HCL 5 MG PO TABS
10.0000 mg | ORAL_TABLET | Freq: Once | ORAL | Status: AC
  Administered 2020-09-19: 10 mg via ORAL
  Filled 2020-09-19: qty 2

## 2020-09-19 NOTE — Progress Notes (Signed)
ANTICOAGULATION CONSULT NOTE -  Pharmacy Consult for Heparin Indication: chest pain/ACS  Allergies  Allergen Reactions  . Atorvastatin Calcium   . Crestor [Rosuvastatin Calcium]     Joint pain and weakness   . Lipitor [Atorvastatin]     Joint pain and weakness  . Other     Patient Measurements: Height: 5\' 9"  (175.3 cm) Weight: 116.8 kg (257 lb 8 oz) IBW/kg (Calculated) : 70.7 HEPARIN DW (KG): 110.4  Vital Signs: Temp: 97.8 F (36.6 C) (05/12 0804) Temp Source: Axillary (05/12 0804) BP: 94/70 (05/12 0800) Pulse Rate: 87 (05/12 0804)  Labs: Recent Labs    Sep 19, 2020 1322 09-19-2020 1322 Sep 19, 2020 1340 Sep 19, 2020 1552 09/18/20 0027 09/18/20 0838 09/18/20 1611 09/19/20 0432  HGB 14.3  --   --   --  13.3  --   --  11.7*  HCT 45.9  --   --   --  41.9  --   --  36.6*  PLT 232  --   --   --  241  --   --  175  HEPARINUNFRC  --    < >  --   --  0.20* 0.42 0.30 0.21*  CREATININE 1.22  --   --   --  1.61*  --   --  2.51*  TROPONINIHS  --   --  1,795* 2,006*  --   --   --   --    < > = values in this interval not displayed.    Estimated Creatinine Clearance: 34.5 mL/min (A) (by C-G formula based on SCr of 2.51 mg/dL (H)).   Medical History: Past Medical History:  Diagnosis Date  . Acute pancreatitis 09/14/2011  . Atrophy of right kidney 09/15/2011  . CAD (coronary artery disease)    DES to mid LAD and distal RCA 2005 - Dr. 2006  . Carotid artery disease (HCC)   . Carpal tunnel syndrome, bilateral   . Chronic back pain   . Essential hypertension   . GERD (gastroesophageal reflux disease)   . History of gallstones   . History of kidney stones   . History of stroke 03/18/2012   Aortic atheroma implicated, was on Coumadin for a period of time and more recently aspirin and Plavix  . History of stroke 03/2016   Left occipital lobe PCA infarct, residual partial loss of visual fields  . History of stroke 11/2011   Large right MCA, residual left hemiparesis  . History of  toe fracture   . Hydroureteronephrosis 05/20/2013   Mod to severe right, mild perinephric stranding secondary to 10x7 mm calculus distal right ureter  . Hyperlipidemia   . Iliac artery aneurysm (HCC) 09/14/2011   Right common, nonaneurysmal dilatation of the infrrenal aorta, noted on CT abd/pelvis  . Meibomian gland dysfunction   . Myocardial infarction (HCC)   . Pneumonia 03/2012  . Retinal artery branch occlusion 11/2011   Secondary to thromboembolic disease, lost vision in Left eye  . Seizures (HCC)    Following stroke  . Senile cataracts of both eyes   . Sleep apnea    Reportedly intolerant of CPAP  . Type 2 diabetes mellitus (HCC)   . Wears dentures     Medications:  See med rec  Assessment: Pt presents to the ED today with right and left wrist pain from a fall.  Pt has a hx of multiple strokes and has baseline difficulty ambulating.  He said he fell backwards and hurt both wrists and shoulders.  Pt is on plavix, but denies hitting his head. Patient also having SOB and diahphoresis.   He says he's had chest pressure for several days.BNP 731, Troponin 1795, EKG NSR with RBBB diffuse ST depression and ST elevation avR . Patient with NSTEMI/Pulmonary edema He's had 4-5 strokes and is bedridden and cared for by his wife. Pharmacy asked to start heparin. Not on oral DOACS  HL 0.42, therapeutic HL 0.30, therapeutic x 2 consecutive checks  HL 0.21 subtherapeutic   Goal of Therapy:  Heparin level 0.3-0.7 units/ml Monitor platelets by anticoagulation protocol: Yes   Plan:  Increase heparin infusion to 1900 units/hr Check anti-Xa level in 8 hours and daily while on heparin Continue to monitor H&H and platelets  Luan Pulling, PharmD, MBA, BCGP Clinical Pharmacist  09/19/2020,8:56 AM

## 2020-09-19 NOTE — Progress Notes (Signed)
Progress Note  Patient Name: Shaun Pugh. Date of Encounter: 09/19/2020  [facility] Cardiologist: Nona Dell, MD   Subjective   Still having some dyspnea but improved. No chest pain or palpitations. Having some hematuria this AM but required in and out cath yesterday (also on Heparin, Plavix and ASA). His wife is at the bedside and echo report was reviewed with both of them.   Inpatient Medications    Scheduled Meds: . aspirin EC  325 mg Oral Daily  . Chlorhexidine Gluconate Cloth  6 each Topical Daily  . clopidogrel  75 mg Oral Daily  . insulin aspart  0-15 Units Subcutaneous TID WC  . insulin aspart  0-5 Units Subcutaneous QHS  . insulin glargine  30 Units Subcutaneous Daily  . metoprolol succinate  12.5 mg Oral Daily  . pantoprazole  40 mg Oral Daily  . senna  1 tablet Oral BID  . vitamin B-12  1,000 mcg Oral Daily   Continuous Infusions: . sodium chloride 50 mL/hr at 09/19/20 0849  . cefTRIAXone (ROCEPHIN)  IV 1 g (09/18/20 1715)  . heparin 1,900 Units/hr (09/19/20 1115)   PRN Meds: acetaminophen **OR** acetaminophen, ondansetron (ZOFRAN) IV, oxyCODONE   Vital Signs    Vitals:   09/19/20 0800 09/19/20 0804 09/19/20 1000 09/19/20 1100  BP: 94/70   (!) 100/55  Pulse: 86 87 87 85  Resp: 19 (!) 21 (!) 27 (!) 23  Temp:  97.8 F (36.6 C)    TempSrc:  Axillary    SpO2: 98% 97% 95% 96%  Weight:      Height:        Intake/Output Summary (Last 24 hours) at 09/19/2020 1134 Last data filed at 09/19/2020 0600 Gross per 24 hour  Intake 851.76 ml  Output 300 ml  Net 551.76 ml   Last 3 Weights 09/18/2020 09/24/2020 09/30/2020  Weight (lbs) 257 lb 8 oz 357 lb 357 lb  Weight (kg) 116.8 kg 161.934 kg 161.934 kg      Telemetry    NSR, HR in 70's to 80's with occasional PVC's.  - Personally Reviewed  ECG    No new tracings.   Physical Exam   GEN: Elderly male appearing in no acute distress.   Neck: JVD at 8 cm Cardiac: RRR, no murmurs, rubs, or  gallops.  Respiratory: Mild rales along bases bilaterally with scattered rhonchi.  GI: Soft, nontender, non-distended  MS: No pitting edema; No deformity. Neuro:  Nonfocal  Psych: Normal affect   Labs    High Sensitivity Troponin:   Recent Labs  Lab 09/12/2020 1340 09/14/2020 1552 09/19/20 0432  TROPONINIHS 1,795* 2,006* 12,915*      Chemistry Recent Labs  Lab 09/08/2020 1322 09/18/20 0027 09/19/20 0432  NA 136 139 134*  K 4.3 4.6 4.4  CL 104 105 104  CO2 20* 22 20*  GLUCOSE 188* 214* 195*  BUN 21 24* 46*  CREATININE 1.22 1.61* 2.51*  CALCIUM 9.3 9.1 8.8*  PROT 8.8* 8.1 7.9  ALBUMIN 4.3 4.0 3.6  AST 38 83* 78*  ALT 21 25 26   ALKPHOS 62 56 49  BILITOT 0.9 1.0 1.0  GFRNONAA >60 46* 27*  ANIONGAP 12 12 10      Hematology Recent Labs  Lab 09/08/2020 1322 09/18/20 0027 09/19/20 0432  WBC 20.9* 19.8* 18.2*  RBC 5.03 4.58 4.00*  HGB 14.3 13.3 11.7*  HCT 45.9 41.9 36.6*  MCV 91.3 91.5 91.5  MCH 28.4 29.0 29.3  MCHC 31.2 31.7 32.0  RDW 16.4* 16.7* 16.6*  PLT 232 241 175    BNP Recent Labs  Lab 10/05/2020 1340  BNP 731.0*     DDimer No results for input(s): DDIMER in the last 168 hours.   Radiology    DG Chest 1 View  Result Date: 09/19/2020 CLINICAL DATA:  Fall.  Bilateral shoulder pain. EXAM: CHEST  1 VIEW COMPARISON:  10/24/2019 FINDINGS: The cardio pericardial silhouette is enlarged. There is pulmonary vascular congestion without overt pulmonary edema. Bibasilar atelectasis or infiltrate noted, right greater than left. Bones are diffusely demineralized. Telemetry leads overlie the chest. IMPRESSION: 1. Enlargement of the cardiopericardial silhouette with pulmonary vascular congestion. 2. Bibasilar atelectasis or infiltrate, right greater than left. Electronically Signed   By: Kennith Center M.D.   On: 10/05/2020 13:46   DG Shoulder Right  Result Date: 09/25/2020 CLINICAL DATA:  Right shoulder pain after fall. EXAM: RIGHT SHOULDER - 2+ VIEW COMPARISON:  None.  FINDINGS: There is no evidence of fracture or dislocation. There is no evidence of arthropathy or other focal bone abnormality. Soft tissues are unremarkable. IMPRESSION: Negative. Electronically Signed   By: Obie Dredge M.D.   On: 09/28/2020 13:47   DG Wrist Complete Left  Result Date: 10/05/2020 CLINICAL DATA:  Left wrist pain.  No known injury. EXAM: LEFT WRIST - COMPLETE 3+ VIEW COMPARISON:  None. FINDINGS: There is no evidence of fracture or dislocation. Mild first CMC osteoarthritis noted. Soft tissues are unremarkable. IMPRESSION: Mild first CMC osteoarthritis.  Otherwise negative. Electronically Signed   By: Drusilla Kanner M.D.   On: 09/21/2020 13:47   DG Wrist Complete Right  Result Date: 09/12/2020 CLINICAL DATA:  Right wrist pain.  No known injury. EXAM: RIGHT WRIST - COMPLETE 3+ VIEW COMPARISON:  None. FINDINGS: There is no evidence of fracture or dislocation. There is no evidence of arthropathy or other focal bone abnormality. Soft tissues are unremarkable. IMPRESSION: Negative exam. Electronically Signed   By: Drusilla Kanner M.D.   On: 10/02/2020 13:46   CT Head Wo Contrast  Result Date: 09/08/2020 CLINICAL DATA:  Neck trauma (Age >= 65y); Mental status change, unknown cause, fell onto left side last night at home, denies head injury and loss of consciousness EXAM: CT HEAD WITHOUT CONTRAST CT CERVICAL SPINE WITHOUT CONTRAST TECHNIQUE: Multidetector CT imaging of the head and cervical spine was performed following the standard protocol without intravenous contrast. Multiplanar CT image reconstructions of the cervical spine were also generated. COMPARISON:  CT a head/neck 03/16/2016, MRI head report from 03/14/2016 FINDINGS: CT HEAD FINDINGS Brain: There is encephalomalacia and in the right frontal, right parietal, temporal, and occipital lobes compatible with old infarct. There is also encephalomalacia in the left occipital parietal lobe compatible with prior PCA infarct. Unchanged  size of the ventricular system with exception of and ex vacuo dilation of the left posterior horn due to now chronic left PCA infarct. Unchanged small chronic infarcts in the cerebellum. There is no new loss of gray-white matter differentiation identified on noncontrast CT. Vascular: No hyperdense vessel or unexpected calcification. Skull: Normal. Negative for fracture or focal lesion. Sinuses/Orbits: There is a small left mastoid effusion. The paranasal sinuses are predominantly clear. The orbits are unremarkable. Other: None CT CERVICAL SPINE FINDINGS Mildly motion degraded exam of the cervical spine. Alignment: Straightening of the normal cervical lordosis, likely due to patient positioning or discomfort. Skull base and vertebrae: There is no evidence of acute fracture. Soft tissues and spinal canal: No prevertebral fluid or swelling. No visible  canal hematoma. Disc levels: There is degenerative disc disease at C3-C4 with small posterior disc osteophyte complex. Upper chest: Negative Other: None IMPRESSION: Multifocal encephalomalacia in the cerebral and cerebellar hemispheres, right greater than left, compatible with old infarcts as seen on prior imaging in 2017. No acute intracranial hemorrhage or evidence of new large territory infarct. If there is concern for new acute infarct, with recommend MRI. No evidence of acute cervical spine fracture. Degenerative disc disease at C3-C4 with small posterior disc osteophyte complex. Electronically Signed   By: Caprice Renshaw   On: Oct 07, 2020 13:34   CT Cervical Spine Wo Contrast  Result Date: 07-Oct-2020 CLINICAL DATA:  Neck trauma (Age >= 65y); Mental status change, unknown cause, fell onto left side last night at home, denies head injury and loss of consciousness EXAM: CT HEAD WITHOUT CONTRAST CT CERVICAL SPINE WITHOUT CONTRAST TECHNIQUE: Multidetector CT imaging of the head and cervical spine was performed following the standard protocol without intravenous contrast.  Multiplanar CT image reconstructions of the cervical spine were also generated. COMPARISON:  CT a head/neck 03/16/2016, MRI head report from 03/14/2016 FINDINGS: CT HEAD FINDINGS Brain: There is encephalomalacia and in the right frontal, right parietal, temporal, and occipital lobes compatible with old infarct. There is also encephalomalacia in the left occipital parietal lobe compatible with prior PCA infarct. Unchanged size of the ventricular system with exception of and ex vacuo dilation of the left posterior horn due to now chronic left PCA infarct. Unchanged small chronic infarcts in the cerebellum. There is no new loss of gray-white matter differentiation identified on noncontrast CT. Vascular: No hyperdense vessel or unexpected calcification. Skull: Normal. Negative for fracture or focal lesion. Sinuses/Orbits: There is a small left mastoid effusion. The paranasal sinuses are predominantly clear. The orbits are unremarkable. Other: None CT CERVICAL SPINE FINDINGS Mildly motion degraded exam of the cervical spine. Alignment: Straightening of the normal cervical lordosis, likely due to patient positioning or discomfort. Skull base and vertebrae: There is no evidence of acute fracture. Soft tissues and spinal canal: No prevertebral fluid or swelling. No visible canal hematoma. Disc levels: There is degenerative disc disease at C3-C4 with small posterior disc osteophyte complex. Upper chest: Negative Other: None IMPRESSION: Multifocal encephalomalacia in the cerebral and cerebellar hemispheres, right greater than left, compatible with old infarcts as seen on prior imaging in 2017. No acute intracranial hemorrhage or evidence of new large territory infarct. If there is concern for new acute infarct, with recommend MRI. No evidence of acute cervical spine fracture. Degenerative disc disease at C3-C4 with small posterior disc osteophyte complex. Electronically Signed   By: Caprice Renshaw   On: 10/07/2020 13:34   DG  Shoulder Left  Result Date: Oct 07, 2020 CLINICAL DATA:  Left shoulder pain. EXAM: LEFT SHOULDER - 2+ VIEW COMPARISON:  None. FINDINGS: No acute fracture or dislocation identified. Bones are osteopenic. Mild degenerative disease of the glenohumeral joint and proximal humerus. No bony lesions or destruction. IMPRESSION: No acute findings.  Mild degenerative disease of the shoulder. Electronically Signed   By: Irish Lack M.D.   On: Oct 07, 2020 13:47   Cardiac Studies   Echocardiogram: 09/18/2020 IMPRESSIONS    1. Left ventricular ejection fraction, by estimation, is <20%. The left  ventricle has severely decreased function. The left ventricle demonstrates  regional wall motion abnormalities (see scoring diagram/findings for  description). There is mild left  ventricular hypertrophy. Left ventricular diastolic parameters are  indeterminate.  LV Wall Scoring:  The mid inferoseptal segment, apical septal  segment, apical anterior  segment,  and apex are akinetic. The anterior wall, antero-lateral wall, entire  inferior wall, and apical lateral segment are hypokinetic. The basal  inferoseptal segment is normal.  2. Right ventricular systolic function was not well visualized. The right  ventricular size is normal. Tricuspid regurgitation signal is inadequate  for assessing PA pressure.  3. The mitral valve is grossly normal. Trivial mitral valve  regurgitation.  4. The aortic valve is tricuspid. Aortic valve regurgitation is not  visualized.  5. The inferior vena cava is normal in size with greater than 50%  respiratory variability, suggesting right atrial pressure of 3 mmHg.   Patient Profile     70 y.o. male w/ PMH of CAD (s/p DES to mid-LAD and DES to distal RCA in 2005, NST in 2018 showing areas of mild to moderate ischemia with medical management pursued), carotid artery stenosis (s/p R CEA in 08/2016), HTN, HLD, IDDM and multiple CVA's (with left hemiparesis) who presented to  [facility] on 09/26/2020 after suffering a fall but had an episode of dyspnea and left arm pain while having a CT Scan --> found to have an NSTEMI.   Assessment & Plan    1. NSTEMI - Presented with epigastric pain but denied classic angina. Did have dyspnea and left arm pain while having his CT Scan on the day of admission. EKG concerning for LM disease or global ischemia given his diffuse ST depression and elevation along AVR.  - Hs Troponin rechecked this AM and has trended up from 2006 to 12915. Repeat echo shows his EF is < 20% (previously at 50-55% in 2017) with regional WMA as outlined above.  - The patient and his family declined cardiac catheterization on admission, therefore medical management has been pursued. Has completed 48 hours of Heparin and will review with MD in regards to stopping today given 48 hours of treatment and hematuria. The patient and his wife are not interested in invasive options and we reviewed he would not even be a candidate at this time given his AKI. Remains on ASA, Plavix and BB therapy. Statin intolerant. No ACE-I/ARB/ARNI for now given his AKI and hypotension. Overall prognosis is poor.   2. CAD - He is s/p DES to mid-LAD and DES to distal RCA in 2005 with most recent ischemic evaluation being a NST in 2018 showing areas of mild to moderate ischemia with medical management pursued. - Remains on ASA (on 325mg  daily per Vascular and Neurology) and Plavix. Was started on Lopressor 12.5mg  BID yesterday and transitioned to Toprol-XL 12.5mg  daily this AM due to soft BP.   3. Carotid Artery Stenosis - Followed by Vascular Surgery as an outpatient given his prior R CEA in 08/2016. Remains on ASA and Plavix. Statin intolerant as outlined below.   4. HTN - Hypotensive overnight and improved this AM. Receiving IVF and remains on Toprol-XL 12.5mg  daily. Lisinopril held given AKI and hypotension.   5. HLD - FLP this admission shows total cholesterol of 192,  triglycerides 229, HDL 29 and LDL 117. Intolerant to statins as an outpatient. Could add Zetia while admitted and refer to the [facility] as an outpatient (perhaps telehealth given his mobility issues) pending clinical status.   6. Type 2 DM - Hgb A1c elevated to at 8.1 this admission. Management per admitting team.   7. AKI - Creatinine was at 1.22 on admission, but treding up to 1.61 on 5/11 and now at 2.51. Lisinopril has been held and  no further diuretic therapy has been administered. Currently receiving IVF at 50 mL/hr. Suspect he will ultimately require diuresis.    For questions or updates, please contact [facility] Please consult [email or web address] for contact info under        Signed, Ellsworth Lennox, PA-C  09/19/2020, 11:34 AM

## 2020-09-19 NOTE — Progress Notes (Signed)
PROGRESS NOTE    Shaun Pugh.  KZL:[id number] DOB: [birth year] DOA: 10/08/2020 PCP: Benita Stabile, MD   Chief Complaint  Patient presents with  . Fall    Brief admission narrative:  Shaun Pugh. is a 70 y.o. male with medical history significant for but not limited too history of acute pancreatitis, history of CAD with drug-eluting stent to the mid LAD and distal RCA back in 2005, carpal tunnel disease, chronic back pain, essential hypertension, history of nephrolithiasis, history of 3 CVAs, history of hydroureteronephrosis, history of MI in the past, Meibomian ganglion dysfunction as well as other comorbidities who presented to the emergency room with acute fall yesterday and complains of bilateral wrist pain or shoulder pain.  Patient is an extremely poor historian given his vascular dementia in the setting of his CVA and when asked what happened yesterday he he states that he fell but wife states that he was transferring from his wheelchair to the bed and he just slid down.  He came in because of wrist pain and then when he went to the scanner to get a CT scan he was laying flat and came back extremely pale and diaphoretic extremely short of breath.  Further work-up was done and showed that he had elevated troponin as well as elevated BNP and he was given IV Lasix.  Patient was placed on nasal cannula without improvement so he was transferred to a nonrebreather.  Patient still was not breathing very well so that he is placed on BiPAP with improvement.  Given his acute respiratory failure with hypoxia requiring noninvasive positive pressure ventilation and elevated BNP and elevated troponin TRH was asked to admit this patient.  Patient denies any chest pain but does admit to having some shortness of breath and no lightheadedness or dizziness.  Wife states that she has had urinary tract infection about 6 weeks ago.  Cardiology has been consulted and offer the patient aggressive measures and transfer  to [facility] however he declined these and so cardiology is recommending medical treatment with IV heparin and obtaining echocardiogram.  ED Course: In the ED patient had basic blood work done as well as a CT of the head and spine as well as multiple x-rays of the chest shoulders and wrist.  He had an EKG done and was given IV Lasix for the last x1 and placed on the BiPAP.  Of note COVID testing was negative.  Cardiology was consulted for further evaluation recommendations.  Assessment & Plan: 1-Acute respiratory failure with hypoxia in the setting of flash pulmonary edema with suspected CHF decompensation and NSTEMI. -Continue to wean oxygen supplementation as tolerated -Overall very poor prognosis; 2D echo demonstrating significantly reduced ejection fraction with multiple regional wall motion normalities -continue heparin drip, aspirin and plavix. -continue adjusted dose of lopressor -Follow daily weights and low-sodium diet.  2-carotid artery stenosis -continue aspirin and plavis -outpatient follow by vascular surgery -prior hx of R CEA  3-HTN -currently stable -Continue to follow blood pressure.  4-HLD -intolerant to statins -will consider use of zetia and outpatient follow-up with lipid clinic as per cardiology service recommendation.  5-type 2 diabetes -A1C 8.1 -continue SSI -Follow CBGs fluctuation  6-acute renal failure -with concerns for CKD from diabetes at baseline -slightly worse today; creatinine 2.5 -Continue holding the use of diuretics or any other nephrotoxic agents -will follow trend and minimize hypotension  7-urinary retention -S/p in and out cath on 09/18/2020 -Continue to follow bladder scan and response -Mild hematuria  appreciated today in the setting of catheterization yesterday while receiving aspirin, Plavix and heparin drip.  8-pressure injury of skin -POA -continue barrier measures and constant repositioning   9-prior hx of stroke with residual  left hemiparesis -no new deficits appreciated currently -continue asa and plavix for secondary prevention -continue risk factors modifications.   DVT prophylaxis: Heparin drip Code Status: DNR/DNI Family Communication: Wife at bedside. Disposition:   Status is: Inpatient  Dispo: The patient is from: home              Anticipated d/c is to: home              Patient currently not medically stable for discharge; continue weaning off oxygen supplementation, follow 2-D echo results and complete 48 hours of heparin drip. Patient with NSTEMI. Will continue to follow cardiology rec's.   Difficult to place patient no       Consultants:   Cardiology service    Procedures:  2D echo: Demonstrating significant left ventricle ejection fraction reduction and multiple regional wall motion abnormalities; EF less than 20%.  Antimicrobials:  Rocephin  Subjective: No chest pain, no nausea, no vomiting, no fever.  Intermittently agitated.  Mild episode of hematuria after in the mouth catheterization for acute urinary retention appreciated today.  Using 2 L nasal cannula supplementation and expressed improvement in his breathing.  Objective: Vitals:   09/19/20 1000 09/19/20 1100 09/19/20 1232 09/19/20 1300  BP:  (!) 100/55  108/61  Pulse: 87 85 85 85  Resp: (!) 27 (!) Temp:   97.8 F (36.6 C)   TempSrc:   Axillary   SpO2: 95% 96% 96% 95%  Weight:      Height:        Intake/Output Summary (Last 24 hours) at 09/19/2020 1438 Last data filed at 09/19/2020 0900 Gross per 24 hour  Intake 851.76 ml  Output 300 ml  Net 551.76 ml   Filed Weights   10/08/2020 1154 09/18/2020 1215 09/18/20 0422  Weight: (!) 161.9 kg (!) 161.9 kg 116.8 kg    Examination: General exam: Afebrile, no chest pain:, No nausea, no vomiting, reports improvement in his breathing.  Mild hematuria appreciated. Respiratory system: No frank crackles, no wheezing, no using accessory muscle.  Good saturation on 2  L nasal cannula supplementation. Cardiovascular system:RRR. No rubs or gallops; unable to assess JVD with body habitus. Gastrointestinal system: Abdomen is obese, nondistended, soft and nontender. No organomegaly or masses felt. Normal bowel sounds heard. Central nervous system: No new focal deficits. Extremities: No cyanosis or clubbing. Skin: No petechiae.  Stage II pressure injury mid buttocks area present on admission.  No signs of superimposed infection. Psychiatry: Mildly agitated intermittently; no suicidal ideation or hallucination.   Data Reviewed: I have personally reviewed following labs and imaging studies  CBC: Recent Labs  Lab 09/08/2020 1322 09/18/20 0027 09/19/20 0432  WBC 20.9* 19.8* 18.2*  NEUTROABS 14.5*  --   --   HGB 14.3 13.3 11.7*  HCT 45.9 41.9 36.6*  MCV 91.3 91.5 91.5  PLT 232 241 175    Basic Metabolic Panel: Recent Labs  Lab 09/15/2020 1322 09/18/20 0027 09/19/20 0432  NA 136 139 134*  K 4.3 4.6 4.4  CL 104 105 104  CO2 20* 22 20*  GLUCOSE 188* 214* 195*  BUN 21 24* 46*  CREATININE 1.22 1.61* 2.51*  CALCIUM 9.3 9.1 8.8*    GFR: Estimated Creatinine Clearance: 34.5 mL/min (A) (by C-G formula based  on SCr of 2.51 mg/dL (H)).  Liver Function Tests: Recent Labs  Lab 09/25/2020 1322 09/18/20 0027 09/19/20 0432  AST 38 83* 78*  ALT 21 25 26   ALKPHOS 62 56 49  BILITOT 0.9 1.0 1.0  PROT 8.8* 8.1 7.9  ALBUMIN 4.3 4.0 3.6    CBG: Recent Labs  Lab 09/18/20 1130 09/18/20 1726 09/18/20 2136 09/19/20 0802 09/19/20 1230  GLUCAP 234* 184* 193* 241* 217*     Recent Results (from the past 240 hour(s))  Resp Panel by RT-PCR (Flu A&B, Covid) Nasopharyngeal Swab     Status: None   Collection Time: 09/29/2020  1:53 PM   Specimen: Nasopharyngeal Swab; Nasopharyngeal(NP) swabs in vial transport medium  Result Value Ref Range Status   SARS Coronavirus 2 by RT PCR NEGATIVE NEGATIVE Final    Comment: (NOTE) SARS-CoV-2 target nucleic acids are NOT  DETECTED.  The SARS-CoV-2 RNA is generally detectable in upper respiratory specimens during the acute phase of infection. The lowest concentration of SARS-CoV-2 viral copies this assay can detect is 138 copies/mL. A negative result does not preclude SARS-Cov-2 infection and should not be used as the sole basis for treatment or other patient management decisions. A negative result may occur with  improper specimen collection/handling, submission of specimen other than nasopharyngeal swab, presence of viral mutation(s) within the areas targeted by this assay, and inadequate number of viral copies(<138 copies/mL). A negative result must be combined with clinical observations, patient history, and epidemiological information. The expected result is Negative.  Fact Sheet for Patients:  BloggerCourse.comhttps://www.fda.gov/media/152166/download  Fact Sheet for Healthcare Providers:  SeriousBroker.ithttps://www.fda.gov/media/152162/download  This test is no t yet approved or cleared by the [place] FDA and  has been authorized for detection and/or diagnosis of SARS-CoV-2 by FDA under an Emergency Use Authorization (EUA). This EUA will remain  in effect (meaning this test can be used) for the duration of the COVID-19 declaration under Section 564(b)(1) of the Act, 21 U.S.C.section 360bbb-3(b)(1), unless the authorization is terminated  or revoked sooner.       Influenza A by PCR NEGATIVE NEGATIVE Final   Influenza B by PCR NEGATIVE NEGATIVE Final    Comment: (NOTE) The Xpert Xpress SARS-CoV-2/FLU/RSV plus assay is intended as an aid in the diagnosis of influenza from Nasopharyngeal swab specimens and should not be used as a sole basis for treatment. Nasal washings and aspirates are unacceptable for Xpert Xpress SARS-CoV-2/FLU/RSV testing.  Fact Sheet for Patients: BloggerCourse.comhttps://www.fda.gov/media/152166/download  Fact Sheet for Healthcare Providers: SeriousBroker.ithttps://www.fda.gov/media/152162/download  This test is not yet  approved or cleared by the [place] FDA and has been authorized for detection and/or diagnosis of SARS-CoV-2 by FDA under an Emergency Use Authorization (EUA). This EUA will remain in effect (meaning this test can be used) for the duration of the COVID-19 declaration under Section 564(b)(1) of the Act, 21 U.S.C. section 360bbb-3(b)(1), unless the authorization is terminated or revoked.  Performed at [facility], [address] [place], [place] [address]   Culture, blood (routine x 2)     Status: None (Preliminary result)   Collection Time: 10/06/2020  2:37 PM   Specimen: BLOOD RIGHT HAND  Result Value Ref Range Status   Specimen Description BLOOD RIGHT HAND  Final   Special Requests   Final    BOTTLES DRAWN AEROBIC AND ANAEROBIC Blood Culture adequate volume   Culture   Final    NO GROWTH 2 DAYS Performed at [facility], [address] [place], [place] [address]  Report Status PENDING  Incomplete  Culture, blood (routine x 2)     Status: None (Preliminary result)   Collection Time: 09/28/2020  2:37 PM   Specimen: BLOOD LEFT HAND  Result Value Ref Range Status   Specimen Description BLOOD LEFT HAND  Final   Special Requests   Final    BOTTLES DRAWN AEROBIC AND ANAEROBIC Blood Culture adequate volume   Culture   Final    NO GROWTH 2 DAYS Performed at [facility], [address], [place], [place] [address]    Report Status PENDING  Incomplete  MRSA PCR Screening     Status: None   Collection Time: 09/18/20  8:50 AM   Specimen: Nasal Mucosa; Nasopharyngeal  Result Value Ref Range Status   MRSA by PCR NEGATIVE NEGATIVE Final    Comment:        The GeneXpert MRSA Assay (FDA approved for NASAL specimens only), is one component of a comprehensive MRSA colonization surveillance program. It is not intended to diagnose MRSA infection nor to guide or monitor treatment for MRSA infections. Performed at [facility], [address] [place], [place] [address]      Radiology  Studies: ECHOCARDIOGRAM COMPLETE  Result Date: 09/18/2020    ECHOCARDIOGRAM REPORT   Patient Name:   Keyshon Stein. Date of Exam: 09/18/2020 Medical Rec #:  [id number]       Height:       69.0 in Accession #:    [id number]      Weight:       257.5 lb Date of Birth:  [birth year]       BSA:          2.300 m Patient Age:    69 years        BP:           96/59 mmHg Patient Gender: M               HR:           91 bpm. [facility]am [facility] Procedure: 2D Echo Indications:    NSTEMI I21.4  History:        Patient has prior history of Echocardiogram examinations, most                 recent 03/16/2016. Stroke; Risk Factors:Diabetes, Hypertension,                 Dyslipidemia and Former Smoker. Elevated Troponin, Aortic                 Embolism.  Sonographer:    Jeryl Columbia RDCS (AE) Referring Phys: [id number] Kateri Mc LATIF [facility] IMPRESSIONS  1. Left ventricular ejection fraction, by estimation, is <20%. The left ventricle has severely decreased function. The left ventricle demonstrates regional wall motion abnormalities (see scoring diagram/findings for description). There is mild left ventricular hypertrophy. Left ventricular diastolic parameters are indeterminate.  2. Right ventricular systolic function was not well visualized. The right ventricular size is normal. Tricuspid regurgitation signal is inadequate for assessing PA pressure.  3. The mitral valve is grossly normal. Trivial mitral valve regurgitation.  4. The aortic valve is tricuspid. Aortic valve regurgitation is not visualized.  5. The inferior vena cava is normal in size with greater than 50% respiratory variability, suggesting right atrial pressure of 3 mmHg. FINDINGS  Left Ventricle: Left ventricular ejection fraction, by estimation, is <20%. The left ventricle has severely decreased function. The left ventricle demonstrates regional wall motion abnormalities. The  left ventricular internal cavity size was normal in size. There is mild left ventricular  hypertrophy. Left ventricular diastolic parameters are indeterminate.  LV Wall Scoring: The mid inferoseptal segment, apical septal segment, apical anterior segment, and apex are akinetic. The anterior wall, antero-lateral wall, entire inferior wall, and apical lateral segment are hypokinetic. The basal inferoseptal segment is normal. Right Ventricle: The right ventricular size is normal. Right vetricular wall thickness was not assessed. Right ventricular systolic function was not well visualized. Tricuspid regurgitation signal is inadequate for assessing PA pressure. Left Atrium: Left atrial size was normal in size. Right Atrium: Right atrial size was normal in size. Pericardium: There is no evidence of pericardial effusion. Presence of pericardial fat pad. Mitral Valve: The mitral valve is grossly normal. Trivial mitral valve regurgitation. Tricuspid Valve: The tricuspid valve is grossly normal. Tricuspid valve regurgitation is trivial. Aortic Valve: The aortic valve is tricuspid. There is mild to moderate aortic valve annular calcification. Aortic valve regurgitation is not visualized. Pulmonic Valve: The pulmonic valve was grossly normal. Pulmonic valve regurgitation is trivial. Aorta: The aortic root is normal in size and structure. Venous: The inferior vena cava is normal in size with greater than 50% respiratory variability, suggesting right atrial pressure of 3 mmHg. IAS/Shunts: No atrial level shunt detected by color flow Doppler.  LEFT VENTRICLE PLAX 2D LVIDd:         5.88 cm  Diastology LVIDs:         4.89 cm  LV e' medial:    8.06 cm/s LV PW:         1.25 cm  LV E/e' medial:  9.1 LV IVS:        1.31 cm  LV e' lateral:   5.87 cm/s LVOT diam:     2.00 cm  LV E/e' lateral: 12.6 LVOT Area:     3.14 cm  RIGHT VENTRICLE RV S prime:     9.46 cm/s TAPSE (M-mode): 2.0 cm LEFT ATRIUM             Index       RIGHT ATRIUM           Index LA diam:        4.80 cm 2.09 cm/m  RA Area:     13.20 cm LA Vol (A2C):    70.4 ml 30.61 ml/m RA Volume:   29.50 ml  12.82 ml/m LA Vol (A4C):   58.5 ml 25.43 ml/m LA Biplane Vol: 66.1 ml 28.74 ml/m   AORTA Ao Root diam: 3.00 cm MITRAL VALVE MV Area (PHT): 3.42 cm    SHUNTS MV Decel Time: 222 msec    Systemic Diam: 2.00 cm MV E velocity: 73.70 cm/s Nona Dell MD Electronically signed by Nona Dell MD Signature Date/Time: 09/18/2020/11:13:13 AM    Final     Scheduled Meds: . aspirin EC  325 mg Oral Daily  . Chlorhexidine Gluconate Cloth  6 each Topical Daily  . clopidogrel  75 mg Oral Daily  . insulin aspart  0-15 Units Subcutaneous TID WC  . insulin aspart  0-5 Units Subcutaneous QHS  . insulin glargine  30 Units Subcutaneous Daily  . metoprolol succinate  12.5 mg Oral Daily  . pantoprazole  40 mg Oral Daily  . senna  1 tablet Oral BID  . vitamin B-12  1,000 mcg Oral Daily   Continuous Infusions: . sodium chloride 50 mL/hr at 09/19/20 0849  . cefTRIAXone (ROCEPHIN)  IV 1 g (09/18/20 1715)  . heparin  1,900 Units/hr (09/19/20 1115)     LOS: 2 days    Time spent: 35 minutes    Vassie Loll, MD [facility]   To contact the attending provider between 7A-7P or the covering provider during after hours 7P-7A, please log into the web site [email or web address] and access using universal [place] password for that web site. If you do not have the password, please call the hospital operator.  09/19/2020, 2:38 PM

## 2020-09-19 NOTE — Progress Notes (Addendum)
Spoke with RN about patient.  RN had just gave patient some Haldol and she stated that patient was highly confused.  Not safe to put patient on Bipap at this time.

## 2020-09-19 NOTE — Progress Notes (Signed)
Physical Therapy Treatment Patient Details Name: Shaun Pugh. MRN: [id number] DOB: 22-Mar-1951 Today's Date: 09/19/2020    History of Present Illness Shaun Pugh. is a 70 y.o. male with medical history significant for but not limited too history of acute pancreatitis, history of CAD with drug-eluting stent to the mid LAD and distal RCA back in 2005, carpal tunnel disease, chronic back pain, essential hypertension, history of nephrolithiasis, history of 3 CVAs, history of hydroureteronephrosis, history of MI in the past, Meibomian ganglion dysfunction as well as other comorbidities who presented to the emergency room with acute fall yesterday and complains of bilateral wrist pain or shoulder pain.  Patient is an extremely poor historian given his vascular dementia in the setting of his CVA and when asked what happened yesterday he he states that he fell but wife states that he was transferring from his wheelchair to the bed and he just slid down.  He came in because of wrist pain and then when he went to the scanner to get a CT scan he was laying flat and came back extremely pale and diaphoretic extremely short of breath.  Further work-up was done and showed that he had elevated troponin as well as elevated BNP and he was given IV Lasix.  Patient was placed on nasal cannula without improvement so he was transferred to a nonrebreather.  Patient still was not breathing very well so that he is placed on BiPAP with improvement.  Given his acute respiratory failure with hypoxia requiring noninvasive positive pressure ventilation and elevated BNP and elevated troponin TRH was asked to admit this patient.  Patient denies any chest pain but does admit to having some shortness of breath and no lightheadedness or dizziness.  Wife states that she has had urinary tract infection about 6 weeks ago.  Cardiology has been consulted and offer the patient aggressive measures and transfer to [facility] however he declined these  and so cardiology is recommending medical treatment with IV heparin and obtaining echocardiogram.    PT Comments    Patient demonstrates slow labored movement for sitting up at bedside with fair/poor return for propping up on right elbow during supine sitting due weakness, once seated initially had frequent leaning backwards, but after 2-3 minutes able to keep trunk in midline with occasional leaning on RUE.  Patient unable to come to a complete stand or stand pivot to chair due to BLE weakness.  Patient demonstrates fair/good return for using RUE and BLE to help reposition self when put back to bed.  Patient's IV dislodged when reaching for head board, pressure applied to IV insertion until bleeding stopped - RN notified.  Patient will benefit from continued physical therapy in hospital and recommended venue below to increase strength, balance, endurance for safe ADLs and gait.    Follow Up Recommendations  SNF     Equipment Recommendations  None recommended by PT    Recommendations for Other Services       Precautions / Restrictions Precautions Precautions: Fall Restrictions Weight Bearing Restrictions: No    Mobility  Bed Mobility Overal bed mobility: Needs Assistance Bed Mobility: Supine to Sit;Sit to Supine     Supine to sit: Max assist Sit to supine: Mod assist   General bed mobility comments: increased time, labored movement    Transfers Overall transfer level: Needs assistance Equipment used: 1 person hand held assist Transfers: Sit to/from Stand Sit to Stand: Mod assist;Max assist         General transfer  comment: unable to come to complete stand due to BLE weakness  Ambulation/Gait                 Stairs             Wheelchair Mobility    Modified Rankin (Stroke Patients Only)       Balance Overall balance assessment: Needs assistance Sitting-balance support: Feet supported;Single extremity supported Sitting balance-Leahy Scale:  Poor Sitting balance - Comments: fair/poor seated at EOB Postural control: Posterior lean Standing balance support: During functional activity;Single extremity supported Standing balance-Leahy Scale: Poor Standing balance comment: using RW with hand and hand held assist on left side                            Cognition Arousal/Alertness: Awake/alert Behavior During Therapy: WFL for tasks assessed/performed Overall Cognitive Status: Within Functional Limits for tasks assessed                                        Exercises      General Comments        Pertinent Vitals/Pain Pain Assessment: No/denies pain    Home Living                      Prior Function            PT Goals (current goals can now be found in the care plan section) Acute Rehab PT Goals Patient Stated Goal: return home with family to assist PT Goal Formulation: With patient Time For Goal Achievement: 10/02/20 Potential to Achieve Goals: Good Progress towards PT goals: Progressing toward goals    Frequency    Min 3X/week      PT Plan Current plan remains appropriate    Co-evaluation              AM-PAC PT "6 Clicks" Mobility   Outcome Measure  Help needed turning from your back to your side while in a flat bed without using bedrails?: A Lot Help needed moving from lying on your back to sitting on the side of a flat bed without using bedrails?: A Lot Help needed moving to and from a bed to a chair (including a wheelchair)?: A Lot Help needed standing up from a chair using your arms (e.g., wheelchair or bedside chair)?: A Lot Help needed to walk in hospital room?: Total Help needed climbing 3-5 steps with a railing? : Total 6 Click Score: 10    End of Session   Activity Tolerance: Patient tolerated treatment well;Patient limited by fatigue Patient left: in bed;with call bell/phone within reach Nurse Communication: Mobility status PT Visit  Diagnosis: Unsteadiness on feet (R26.81);Other abnormalities of gait and mobility (R26.89);Muscle weakness (generalized) (M62.81)     Time: 2703-5009 PT Time Calculation (min) (ACUTE ONLY): 32 min  Charges:  $Therapeutic Activity: 23-37 mins                     11:56 AM, 09/19/20 Ocie Bob, MPT Physical Therapist with [facility] 336 [phone] office [phone] mobile phone

## 2020-09-19 NOTE — Progress Notes (Signed)
TRH night shift.  The nursing staff reported that the patient is restless.  He has been having periods of his restlessness in the evenings and this was confirmed to the staff by the patient's wife.  Haloperidol 5 mg IVP x1 dose ordered.  His blood pressure measurements have been soft.  I am also adding on midodrine 10 mg p.o. x1.  We will continue to monitor.  Sanda Klein, MD.

## 2020-09-19 NOTE — [place] FL2 (Signed)
[place] MEDICAID FL2 LEVEL OF CARE SCREENING TOOL     IDENTIFICATION  Patient Name: Shaun Pugh. Birthdate: [birth year] Sex: male Admission Date (Current Location): 09/11/2020  [place] Number:  [employer] and Address:  [facility],  [address] [place] [address]      Provider Number: [id number]  Attending Physician Name and Address:  Vassie Loll, MD  Relative Name and Phone Number:       Current Level of Care: Hospital Recommended Level of Care: Skilled Nursing Facility Prior Approval Number:    Date Approved/Denied:   PASRR Number: [id number]  Discharge Plan: SNF    Current Diagnoses: Patient Active Problem List   Diagnosis Date Noted  . Pressure injury of skin 09/18/2020  . Acute respiratory failure with hypoxia (HCC) 09/18/2020  . Elevated troponin 10/02/2020  . S/P carotid endarterectomy 11/04/2016  . Symptomatic carotid artery stenosis, right 09/04/2016  . History of stroke 06/24/2016  . 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 Stenosis of right carotid artery 06/17/2016  . Blurry vision, bilateral   . CVA (cerebral vascular accident) (HCC) 03/13/2016  . Diabetes mellitus (HCC) 03/13/2016  . Essential hypertension 03/13/2016  . Other hyperlipidemia 03/13/2016  . Muscle weakness (generalized) 05/31/2012  . Difficulty walking 05/31/2012  . Lack of coordination 05/31/2012  . Cytotoxic cerebral edema (HCC) 03/19/2012  . Aortic embolism or thrombosis (HCC) 03/19/2012  . Cerebrovascular accident (CVA) due to embolism of left posterior cerebral artery (HCC) 03/18/2012  . Seizure (HCC) 03/18/2012  . Stented coronary artery   . Hemiplegia affecting nondominant side (HCC) 03/17/2012  . Atrophy of right kidney 09/15/2011  . Kidney calculi 09/15/2011  . Acute pancreatitis 09/14/2011  . Cholelithiasis 09/14/2011  . Hyperglycemia 09/14/2011    Orientation RESPIRATION BLADDER Height & Weight      Self,Time,Situation,Place  Normal External catheter Weight: 116.8 kg Height:  5\' 9"  (175.3 cm)  BEHAVIORAL SYMPTOMS/MOOD NEUROLOGICAL BOWEL NUTRITION STATUS    Convulsions/Seizures (history after stroke) Continent Diet (Heart healthy/carb modified. See d/c summary for updates.)  AMBULATORY STATUS COMMUNICATION OF NEEDS Skin   Extensive Assist Verbally Other (Comment) (stage 2 to buttocks)                       Personal Care Assistance Level of Assistance  Bathing,Dressing,Feeding Bathing Assistance: Maximum assistance Feeding assistance: Limited assistance Dressing Assistance: Maximum assistance     Functional Limitations Info  Sight,Hearing,Speech Sight Info: Impaired Hearing Info: Adequate Speech Info: Adequate    SPECIAL CARE FACTORS FREQUENCY  PT (By licensed PT),OT (By licensed OT)     PT Frequency: 5x weekly OT Frequency: 5x weekly            Contractures Contractures Info: Not present    Additional Factors Info  Code Status,Allergies,Psychotropic Code Status Info: DNR Allergies Info: Atorvastatin calcium, Crestor (rosuvastatin calcium), Lipitor (atorvastatin), other Psychotropic Info: Lexapro         Current Medications (09/19/2020):  This is the current hospital active medication list Current Facility-Administered Medications  Medication Dose Route Frequency Provider Last Rate Last Admin  . 0.9 %  sodium chloride infusion   Intravenous Continuous 11/19/2020, MD 50 mL/hr at 09/19/20 0849 New Bag at 09/19/20 0849  . acetaminophen (TYLENOL) tablet 650 mg  650 mg Oral Q6H PRN 11/19/20 [place], DO   650 mg at 10/08/2020 2038   Or  . acetaminophen (TYLENOL) suppository 650 mg  650 mg Rectal Q6H PRN 2039,  Heloise Beecham, DO      . aspirin EC tablet 325 mg  325 mg Oral Daily Marguerita Merles [place], [place]   325 mg at 09/19/20 0850  . cefTRIAXone (ROCEPHIN) 1 g in sodium chloride 0.9 % 100 mL IVPB  1 g Intravenous Q24H Sheikh, Kateri Mc [place], DO 200 mL/hr at  09/18/20 1715 1 g at 09/18/20 1715  . Chlorhexidine Gluconate Cloth 2 % PADS 6 each  6 each Topical Daily Marguerita Merles [place], [place]   6 each at 09/19/20 [phone]  . clopidogrel (PLAVIX) tablet 75 mg  75 mg Oral Daily Marguerita Merles [place], DO   75 mg at 09/19/20 0850  . heparin ADULT infusion 100 units/mL (25000 units/253mL)  1,900 Units/hr Intravenous Continuous Coffee, Gerre Pebbles, RPH 19 mL/hr at 09/19/20 1115 1,900 Units/hr at 09/19/20 1115  . insulin aspart (novoLOG) injection 0-15 Units  0-15 Units Subcutaneous TID WC SheikhKateri Mc [place], DO   5 Units at 09/19/20 1254  . insulin aspart (novoLOG) injection 0-5 Units  0-5 Units Subcutaneous QHS Marguerita Merles [place], [place]   2 Units at 10-09-20 2141  . insulin glargine (LANTUS) injection 30 Units  30 Units Subcutaneous Daily Marguerita Merles [place], [place]   30 Units at 09/19/20 9604  . metoprolol succinate (TOPROL-XL) 24 hr tablet 12.5 mg  12.5 mg Oral Daily Vassie Loll, MD   12.5 mg at 09/19/20 0850  . ondansetron (ZOFRAN) injection 4 mg  4 mg Intravenous Q6H PRN Marguerita Merles [place], DO   4 mg at 09/19/20 0434  . oxyCODONE (Oxy IR/ROXICODONE) immediate release tablet 5 mg  5 mg Oral Q6H PRN Vassie Loll, MD      . pantoprazole (PROTONIX) EC tablet 40 mg  40 mg Oral Daily Marguerita Merles [place], DO   40 mg at 09/19/20 0850  . senna (SENOKOT) tablet 8.6 mg  1 tablet Oral BID Marguerita Merles [place], DO   8.6 mg at 09/19/20 5409  . vitamin B-12 (CYANOCOBALAMIN) tablet 1,000 mcg  1,000 mcg Oral Daily Marguerita Merles [place], DO   1,000 mcg at 09/19/20 8119     Discharge Medications: Please see discharge summary for a list of discharge medications.  Relevant Imaging Results:  Relevant Lab Results:   Additional Information SSN: [id number].  Leitha Bleak, RN

## 2020-09-19 NOTE — Progress Notes (Signed)
Report called to [facility] on Dept. 300.

## 2020-09-19 NOTE — TOC Progression Note (Signed)
Transition of Care (TOC) - Progression Note    Patient Details  Name: Shaun Pugh. MRN: [id number] Date of Birth: 1951-02-14  Transition of Care [facility]) CM/SW Contact  Leitha Bleak, RN Phone Number: 09/19/2020, 1:08 PM  Clinical Narrative:   TOC spoke with wife about SNF vs HH.  Patient seems weaker today, She is agreeing with SNF for short term rehab. [facility] first choice, patient is not vaccinated.  [facility] is second choice. FL2 sent out for bed offers.    Barriers to Discharge: Continued Medical Work up  Expected Discharge Plan and Services   In-house Referral: Clinical Social Work     Living arrangements for the past 2 months: Single Family Home                 DME Arranged: N/A DME Agency: NA

## 2020-09-19 NOTE — Progress Notes (Signed)
TRH night shift.  The nursing staff reports that the patient has been having soft blood pressures with a systolic in the mid 80s and diastolics in the 50s with MAP in the low 60s.  He was running low blood pressures earlier and during dayshift.  He is on metoprolol 12.5 mg p.o. twice daily.  Renal function is normal.  Midodrine 10 mg p.o. x1 dose ordered.  Sanda Klein, MD.

## 2020-09-20 ENCOUNTER — Encounter (HOSPITAL_COMMUNITY): Payer: Self-pay | Admitting: Internal Medicine

## 2020-09-20 ENCOUNTER — Inpatient Hospital Stay (HOSPITAL_COMMUNITY): Payer: Medicare Other

## 2020-09-20 DIAGNOSIS — J9601 Acute respiratory failure with hypoxia: Secondary | ICD-10-CM | POA: Diagnosis not present

## 2020-09-20 DIAGNOSIS — R570 Cardiogenic shock: Secondary | ICD-10-CM | POA: Diagnosis not present

## 2020-09-20 DIAGNOSIS — L89302 Pressure ulcer of unspecified buttock, stage 2: Secondary | ICD-10-CM

## 2020-09-20 DIAGNOSIS — I214 Non-ST elevation (NSTEMI) myocardial infarction: Secondary | ICD-10-CM

## 2020-09-20 DIAGNOSIS — Z8673 Personal history of transient ischemic attack (TIA), and cerebral infarction without residual deficits: Secondary | ICD-10-CM | POA: Diagnosis not present

## 2020-09-20 DIAGNOSIS — R778 Other specified abnormalities of plasma proteins: Secondary | ICD-10-CM | POA: Diagnosis not present

## 2020-09-20 DIAGNOSIS — I10 Essential (primary) hypertension: Secondary | ICD-10-CM | POA: Diagnosis not present

## 2020-09-20 LAB — CBC
HCT: 42.9 % (ref 39.0–52.0)
Hemoglobin: 12.9 g/dL — ABNORMAL LOW (ref 13.0–17.0)
MCH: 29.2 pg (ref 26.0–34.0)
MCHC: 30.1 g/dL (ref 30.0–36.0)
MCV: 97.1 fL (ref 80.0–100.0)
Platelets: 311 10*3/uL (ref 150–400)
RBC: 4.42 MIL/uL (ref 4.22–5.81)
RDW: 17.2 % — ABNORMAL HIGH (ref 11.5–15.5)
WBC: 24.7 10*3/uL — ABNORMAL HIGH (ref 4.0–10.5)
nRBC: 0.1 % (ref 0.0–0.2)

## 2020-09-20 LAB — BASIC METABOLIC PANEL
Anion gap: 17 — ABNORMAL HIGH (ref 5–15)
BUN: 69 mg/dL — ABNORMAL HIGH (ref 8–23)
CO2: 14 mmol/L — ABNORMAL LOW (ref 22–32)
Calcium: 8.5 mg/dL — ABNORMAL LOW (ref 8.9–10.3)
Chloride: 101 mmol/L (ref 98–111)
Creatinine, Ser: 3.15 mg/dL — ABNORMAL HIGH (ref 0.61–1.24)
GFR, Estimated: 20 mL/min — ABNORMAL LOW (ref >60)
Glucose, Bld: 207 mg/dL — ABNORMAL HIGH (ref 70–99)
Potassium: 4.9 mmol/L (ref 3.5–5.1)
Sodium: 132 mmol/L — ABNORMAL LOW (ref 135–145)

## 2020-09-20 LAB — GLUCOSE, CAPILLARY
Glucose-Capillary: 267 mg/dL — ABNORMAL HIGH (ref 70–99)
Glucose-Capillary: 194 mg/dL — ABNORMAL HIGH (ref 70–99)

## 2020-09-20 LAB — LACTIC ACID, PLASMA
Lactic Acid, Venous: 5.3 mmol/L (ref 0.5–1.9)
Lactic Acid, Venous: 4.2 mmol/L (ref 0.5–1.9)

## 2020-09-20 LAB — URINE CULTURE: Culture: <10000 — AB

## 2020-09-20 LAB — HEPARIN LEVEL (UNFRACTIONATED): Heparin Unfractionated: <0.1 IU/mL — ABNORMAL LOW (ref 0.30–0.70)

## 2020-09-20 LAB — TROPONIN I (HIGH SENSITIVITY)
Troponin I (High Sensitivity): 9103 ng/L (ref <18)
Troponin I (High Sensitivity): 7110 ng/L (ref <18)

## 2020-09-20 MED ORDER — SODIUM CHLORIDE 0.9 % IV SOLN: 250.0000 mL | INTRAVENOUS | Status: DC

## 2020-09-20 MED ORDER — LACTATED RINGERS IV BOLUS
250.0000 mL | Freq: Once | INTRAVENOUS | Status: AC
  Administered 2020-09-20: 250 mL via INTRAVENOUS

## 2020-09-20 MED ORDER — LACTATED RINGERS IV BOLUS
500.0000 mL | Freq: Once | INTRAVENOUS | Status: AC
  Administered 2020-09-20: 500 mL via INTRAVENOUS

## 2020-09-20 MED ORDER — PHENYLEPHRINE HCL-NACL 10-0.9 MG/250ML-% IV SOLN
25.0000 ug/min | INTRAVENOUS | Status: DC
  Administered 2020-09-20: 70 ug/min via INTRAVENOUS
  Administered 2020-09-20: 78 ug/min via INTRAVENOUS
  Administered 2020-09-20: 25 ug/min via INTRAVENOUS
  Filled 2020-09-20 (×3): qty 250

## 2020-09-20 MED ORDER — PHENYLEPHRINE HCL-NACL 10-0.9 MG/250ML-% IV SOLN
INTRAVENOUS | Status: AC
  Filled 2020-09-20: qty 250

## 2020-09-22 LAB — CULTURE, BLOOD (ROUTINE X 2)
Culture: NO GROWTH
Culture: NO GROWTH
Special Requests: ADEQUATE
Special Requests: ADEQUATE

## 2020-10-09 NOTE — Progress Notes (Signed)
TRH night shift ICU transfer note.  The patient was seen after the staff reported that he became hypotensive with a systolic BP in the 60s and 70s, more confused, diaphoretic and dyspneic with a RR in the high 20s and low 30s. O2 sat high 90s to 100% on supplemental oxygen via nasal cannula. Lungs were diminished on both bases with no wheezing, rhonchi or rales.  His heart rate was in the high 90s to low 100s.  Abdomen was obese, soft nontender.  Extremities, no edema, clubbing or cyanosis.  Severe generalized weakness on musculoskeletal examination.  Neuro residual left hemiparesis which seem to be unchanged.  He denied chest pain, but stated he was dyspneic.  Hypoglycemia was ruled out as CBG was 267 mg/dL.  EKG looks similar.  Troponin is down to 9103 ng/L.  However, lactic acidosis of 5.7 mmol/L.  We are still waiting for his regular morning labs.  His chest radiograph did not show significant changes.  Assessment: Hypotension  In the setting of NSTEMI with severely depressed EF. His BP partially responded to 500 mL and then 250 mL of LR bolus. However, given his advanced heart failure had to start pressors.  Lactic acidosis In the setting of hypotension and respiratory insufficiency. Continue hypertension treatment with phenylephrine infusion. Keep oxygenation as optimal as possible.  Family communication I spoke to the patient's wife over the phone and she is currently bedside since his confusion seems to do better when she is present to calm him down.  We will continue only with the current medical treatment at this time while also making emphasis on patient's comfort.  Sanda Klein, MD.

## 2020-10-09 NOTE — Progress Notes (Signed)
Blood pressure readings are lower in the LEFT arm compared to the RIGHT arm

## 2020-10-09 NOTE — Progress Notes (Signed)
Progress Note  Patient Name: Shaun Pugh. Date of Encounter: 10/03/2020  Primary Cardiologist: Nona Dell, MD  Subjective   Chart reviewed.  Patient initially transitioned out of unit, but had to return due to hypotension and shortness of breath, confusion.  Blood pressure in left arm generally lower than right arm per discussion with wife.  Blood pressure trend has been low however, currently on Neo-Synephrine per covering overnight team.  He is short of breath this morning, no chest pain.  Inpatient Medications    Scheduled Meds: . aspirin EC  325 mg Oral Daily  . Chlorhexidine Gluconate Cloth  6 each Topical Daily  . clopidogrel  75 mg Oral Daily  . insulin aspart  0-15 Units Subcutaneous TID WC  . insulin aspart  0-5 Units Subcutaneous QHS  . insulin glargine  30 Units Subcutaneous Daily  . metoprolol succinate  12.5 mg Oral Daily  . pantoprazole  40 mg Oral Daily  . senna  1 tablet Oral BID  . vitamin B-12  1,000 mcg Oral Daily   Continuous Infusions: . sodium chloride Stopped (09/30/2020 0411)  . phenylephrine (NEO-SYNEPHRINE) Adult infusion 78 mcg/min (10/05/2020 0756)   PRN Meds: acetaminophen **OR** acetaminophen, ALPRAZolam, ondansetron (ZOFRAN) IV, oxyCODONE   Vital Signs    Vitals:   10/01/2020 0400 09/19/2020 0500 09/19/2020 0800 10/08/2020 0802  BP: 110/74 103/78    Pulse: 93 92 86   Resp: 16 17 17    Temp: (!) 96.6 F (35.9 C)     TempSrc: Axillary     SpO2: 100% 100% 99% 99%  Weight: 117.9 kg     Height:        Intake/Output Summary (Last 24 hours) at 09/18/2020 0831 Last data filed at 09/10/2020 0550 Gross per 24 hour  Intake 1389.08 ml  Output --  Net 1389.08 ml   Filed Weights   10/16/20 1215 09/18/20 0422 09/19/2020 0400  Weight: (!) 161.9 kg 116.8 kg 117.9 kg    Telemetry    Sinus rhythm.  Personally reviewed.  ECG    An ECG dated 2020/10/16 was personally reviewed today and demonstrated:  Sinus rhythm with ST segment abnormalities  consistent with global ischemia.  Physical Exam   GEN:  Elderly male, tachypneic. Neck: No JVD. Cardiac:  Indistinct PMI, RRR, soft systolic murmur, no gallop.  Respiratory:  Scattered rhonchi with prolonged expiratory phase. GI:  Protuberant, bowel sounds present. MS: No edema; No deformity. Neuro:  Nonfocal. Psych: Alert and oriented x 3.  Residual neurological defects from prior stroke.  Labs    Chemistry Recent Labs  Lab Oct 16, 2020 1322 09/18/20 0027 09/19/20 0432 09/29/2020 0426  NA 136 139 134* 132*  K 4.3 4.6 4.4 4.9  CL 104 105 104 101  CO2 20* 22 20* 14*  GLUCOSE 188* 214* 195* 207*  BUN 21 24* 46* 69*  CREATININE 1.22 1.61* 2.51* 3.15*  CALCIUM 9.3 9.1 8.8* 8.5*  PROT 8.8* 8.1 7.9  --   ALBUMIN 4.3 4.0 3.6  --   AST 38 83* 78*  --   ALT 21 25 26   --   ALKPHOS 62 56 49  --   BILITOT 0.9 1.0 1.0  --   GFRNONAA >60 46* 27* 20*  ANIONGAP 12 12 10  17*     Hematology Recent Labs  Lab 09/18/20 0027 09/19/20 0432 10/08/2020 0426  WBC 19.8* 18.2* 24.7*  RBC 4.58 4.00* 4.42  HGB 13.3 11.7* 12.9*  HCT 41.9 36.6* 42.9  MCV 91.5 91.5  97.1  MCH 29.0 29.3 29.2  MCHC 31.7 32.0 30.1  RDW 16.7* 16.6* 17.2*  PLT 241 175 311    Cardiac Enzymes Recent Labs  Lab 2020/09/28 1340 09/28/20 1552 09/19/20 0432 09/26/2020 0203 09/25/2020 0426  TROPONINIHS 1,795* 2,006* 12,915* 9,103* 7,110*    BNP Recent Labs  Lab Sep 28, 2020 1340  BNP 731.0*     Radiology    DG CHEST PORT 1 VIEW  Result Date: 09/18/2020 CLINICAL DATA:  70 year old male with shortness of breath EXAM: PORTABLE CHEST 1 VIEW COMPARISON:  Chest radiograph dated 09/28/20 FINDINGS: There is cardiomegaly with vascular congestion. No focal consolidation or pneumothorax. Trace pleural effusion may be present. No acute osseous pathology. IMPRESSION: Cardiomegaly with vascular congestion. No focal consolidation. Electronically Signed   By: Elgie Collard M.D.   On: 10/07/2020 02:11   ECHOCARDIOGRAM  COMPLETE  Result Date: 09/18/2020    ECHOCARDIOGRAM REPORT   Patient Name:   Shaun Pugh. Date of Exam: 09/18/2020 Medical Rec #:  [id number]       Height:       69.0 in Accession #:    [id number]      Weight:       257.5 lb Date of Birth:  29-Aug-1950       BSA:          2.300 m Patient Age:    69 years        BP:           96/59 mmHg Patient Gender: M               HR:           91 bpm. [facility] Procedure: 2D Echo Indications:    NSTEMI I21.4  History:        Patient has prior history of Echocardiogram examinations, most                 recent 03/16/2016. Stroke; Risk Factors:Diabetes, Hypertension,                 Dyslipidemia and Former Smoker. Elevated Troponin, Aortic                 Embolism.  Sonographer:    Jeryl Columbia RDCS (AE) Referring Phys: [id number] Kateri Mc LATIF [facility] IMPRESSIONS  1. Left ventricular ejection fraction, by estimation, is <20%. The left ventricle has severely decreased function. The left ventricle demonstrates regional wall motion abnormalities (see scoring diagram/findings for description). There is mild left ventricular hypertrophy. Left ventricular diastolic parameters are indeterminate.  2. Right ventricular systolic function was not well visualized. The right ventricular size is normal. Tricuspid regurgitation signal is inadequate for assessing PA pressure.  3. The mitral valve is grossly normal. Trivial mitral valve regurgitation.  4. The aortic valve is tricuspid. Aortic valve regurgitation is not visualized.  5. The inferior vena cava is normal in size with greater than 50% respiratory variability, suggesting right atrial pressure of 3 mmHg. FINDINGS  Left Ventricle: Left ventricular ejection fraction, by estimation, is <20%. The left ventricle has severely decreased function. The left ventricle demonstrates regional wall motion abnormalities. The left ventricular internal cavity size was normal in size. There is mild left ventricular hypertrophy. Left  ventricular diastolic parameters are indeterminate.  LV Wall Scoring: The mid inferoseptal segment, apical septal segment, apical anterior segment, and apex are akinetic. The anterior wall, antero-lateral wall, entire inferior wall, and apical lateral segment are hypokinetic. The basal inferoseptal segment is normal.  Right Ventricle: The right ventricular size is normal. Right vetricular wall thickness was not assessed. Right ventricular systolic function was not well visualized. Tricuspid regurgitation signal is inadequate for assessing PA pressure. Left Atrium: Left atrial size was normal in size. Right Atrium: Right atrial size was normal in size. Pericardium: There is no evidence of pericardial effusion. Presence of pericardial fat pad. Mitral Valve: The mitral valve is grossly normal. Trivial mitral valve regurgitation. Tricuspid Valve: The tricuspid valve is grossly normal. Tricuspid valve regurgitation is trivial. Aortic Valve: The aortic valve is tricuspid. There is mild to moderate aortic valve annular calcification. Aortic valve regurgitation is not visualized. Pulmonic Valve: The pulmonic valve was grossly normal. Pulmonic valve regurgitation is trivial. Aorta: The aortic root is normal in size and structure. Venous: The inferior vena cava is normal in size with greater than 50% respiratory variability, suggesting right atrial pressure of 3 mmHg. IAS/Shunts: No atrial level shunt detected by color flow Doppler.  LEFT VENTRICLE PLAX 2D LVIDd:         5.88 cm  Diastology LVIDs:         4.89 cm  LV e' medial:    8.06 cm/s LV PW:         1.25 cm  LV E/e' medial:  9.1 LV IVS:        1.31 cm  LV e' lateral:   5.87 cm/s LVOT diam:     2.00 cm  LV E/e' lateral: 12.6 LVOT Area:     3.14 cm  RIGHT VENTRICLE RV S prime:     9.46 cm/s TAPSE (M-mode): 2.0 cm LEFT ATRIUM             Index       RIGHT ATRIUM           Index LA diam:        4.80 cm 2.09 cm/m  RA Area:     13.20 cm LA Vol (A2C):   70.4 ml 30.61 ml/m  RA Volume:   29.50 ml  12.82 ml/m LA Vol (A4C):   58.5 ml 25.43 ml/m LA Biplane Vol: 66.1 ml 28.74 ml/m   AORTA Ao Root diam: 3.00 cm MITRAL VALVE MV Area (PHT): 3.42 cm    SHUNTS MV Decel Time: 222 msec    Systemic Diam: 2.00 cm MV E velocity: 73.70 cm/s Nona Dell MD Electronically signed by Nona Dell MD Signature Date/Time: 09/18/2020/11:13:13 AM    Final     Patient Profile     70 y.o. male with a history of CAD (s/p DES to mid-LAD and DES to distal RCA in 2005, NST in 2018 showing areas of mild to moderate ischemia with medical management pursued), carotid artery stenosis (s/p R CEA in 08/2016), HTN, HLD, IDDM and multiple CVA's (with left hemiparesis) who presented to [facility] on 10/10/2020 after suffering a fall but had an episode of dyspnea and left arm pain while having a CT Scan --> found to have an NSTEMI.  Assessment & Plan    1.  NSTEMI, currently with cardiogenic shock and severe LV dysfunction with LVEF less than 20% by echocardiogram.  He has acute renal failure and also lactic acidosis consistent with shock.  Per discussion with patient and wife, they have declined invasive measures and he is DNR.  Presently on aspirin and Plavix, lisinopril discontinued and Toprol-XL recently held.  He has history of multi-statin intolerance.  2.  CAD status post DES to the mid LAD and DES to  distal RCA in 2005.  3.  Mixed hyperlipidemia with multi-statin intolerance.  LDL 117.  4.  Acute renal failure, creatinine has increased from 1.22 to 3.15.  5.  History of prior strokes with residua and limited functional capacity at baseline.  Continue aspirin and Plavix.  He completed 48 hours of heparin.  Start Zetia 10 mg daily.  Would try and wean off of Neo-Synephrine.  Converting to different pressor regiment with inotropes would be of limited long-term consequence at this time, his prognosis remains quite poor.  He most likely has left main or multivessel CAD and would not be a  candidate for surgical intervention.  Suggest palliative care consultation, and discussion regarding comfort measures, likely hospice.  Discussed with Dr. Gwenlyn PerkingMadera.  Signed, Nona DellSamuel Natalyn Szymanowski, MD  2020-12-06, 8:31 AM

## 2020-10-09 NOTE — Progress Notes (Signed)
Patient brady into 82s and went into PEA then asystole. This RN and another RN confirmed time of death 51. Patient's wife at bedside. MD notified. Oxygen removed. IV infusions stopped. Spiritual needs of wife met.

## 2020-10-09 NOTE — Progress Notes (Addendum)
Went to round on patient, noticed that patient was diaphoretic, dyspneic and even more confused. MD paged and also respiratory therapist.  A set of vitals were obtained and blood sugar. Other nurses came into to help and two IVs were inserted. MD came to bedside and ordered a bolus of LR, xray and labs and transfer order. Patient was transferred to ICU 9. MD called family. Report given to Fayrene Fearing at bedside.

## 2020-10-09 NOTE — Death Summary Note (Signed)
DEATH SUMMARY   Patient Details  Name: Augustin Bun. MRN: [id number] DOB: 08/17/1950  Admission/Discharge Information   Admit Date:  Sep 20, 2020  Date of Death: Date of Death: 09/23/20  Time of Death: Time of Death: 0941  Length of Stay: 3  Referring Physician: Benita Stabile, MD   Reason(s) for Hospitalization  Epigastric pain and SOB  Diagnoses  Preliminary cause of death: NSTEMI with cardiogenic shock. Secondary Diagnoses (including complications and co-morbidities):  Active Problems:   Cerebrovascular accident (CVA) due to embolism of left posterior cerebral artery (HCC)   Diabetes mellitus (HCC)   Essential hypertension   Other hyperlipidemia   History of stroke   Acute respiratory failure with hypoxia (HCC)   Elevated troponin   Pressure injury of skin   Non-ST elevation (NSTEMI) myocardial infarction [facility])   Cardiogenic shock [facility])   Brief Hospital Course (including significant findings, care, treatment, and services provided and events leading to death)  Tim Corriher Jr.is a 70 y.o.malewith medical history significantfor but not limited toohistory of acute pancreatitis, history of CAD with drug-eluting stent to the mid LAD and distal RCA back in 08/28/2003, carpal tunnel disease, chronic back pain, essential hypertension, history of nephrolithiasis, history of 3 CVAs, history of hydroureteronephrosis, history of MI in the past, Meibomianganglion dysfunction as well as other comorbidities who presented to the emergency room with acute fall yesterday and complains of bilateral wrist pain or shoulder pain. Patient is an extremely poor historian given his vascular dementia in the setting of his CVA and when asked what happened yesterday he he states that he fell but wife states that he was transferring from his wheelchair to the bed and he just slid down. He came in because of wrist pain and then when he went to the scanner to get a CT scan he was laying flat and came back  extremely pale and diaphoretic extremely short of breath. Further work-up was done and showed that he had elevated troponin as well as elevated BNP and he was given IV Lasix. Patient was placed on nasal cannula without improvement so he was transferred to a nonrebreather. Patient still was not breathing very well so that he is placed on BiPAP with improvement. Given his acute respiratory failure with hypoxia requiring noninvasive positive pressure ventilation and elevated BNP and elevated troponin TRH was asked to admit this patient. Patient denies any chest pain but does admit to having some shortness of breath and no lightheadedness or dizziness. Wife states that she has had urinary tract infection about 6 weeks ago. Cardiology has been consulted and offer the patient aggressive measures and transfer to [facility] however he declined these and so cardiology is recommending medical treatment with IV heparin and obtaining echocardiogram.  ED Course:In the ED patient had basic blood work done as well as a CT of the head and spine as well as multiple x-rays of the chest shoulders and wrist. He had an EKG done and was given IV Lasix for the last x1 and placed on the BiPAP. Of note COVID testing was negative. Cardiology was consulted for further evaluation recommendations.  Patient and wife expressed wishes for not invasive intervention, and expressed that his quality of life after stroke and bedbound stay was very poor; they wanted medical management and conservative approach. Patient treated with b-blocker, aspirin, plavix, oxygen and 48 hours of heparin drip. Patient's course complicated with development of cardiogenic shock and AKI. Despite use of pressors and IVF's, condition continue deteriorating and on 2020-09-23  at 9:41 am he went into bradycardia with subsequent PEA arrest and passed away. Patient's wife was at bedside and grateful for our care and treatment, we were in the process of transitioning into  hospice and full comfort management.    Pertinent Labs and Studies  Significant Diagnostic Studies DG Chest 1 View  Result Date: 07-05-2020 CLINICAL DATA:  Fall.  Bilateral shoulder pain. EXAM: CHEST  1 VIEW COMPARISON:  10/24/2019 FINDINGS: The cardio pericardial silhouette is enlarged. There is pulmonary vascular congestion without overt pulmonary edema. Bibasilar atelectasis or infiltrate noted, right greater than left. Bones are diffusely demineralized. Telemetry leads overlie the chest. IMPRESSION: 1. Enlargement of the cardiopericardial silhouette with pulmonary vascular congestion. 2. Bibasilar atelectasis or infiltrate, right greater than left. Electronically Signed   By: Kennith CenterEric  Mansell M.D.   On: 002-25-2022 13:46   DG Shoulder Right  Result Date: 07-05-2020 CLINICAL DATA:  Right shoulder pain after fall. EXAM: RIGHT SHOULDER - 2+ VIEW COMPARISON:  None. FINDINGS: There is no evidence of fracture or dislocation. There is no evidence of arthropathy or other focal bone abnormality. Soft tissues are unremarkable. IMPRESSION: Negative. Electronically Signed   By: Obie DredgeWilliam T Derry M.D.   On: 002-25-2022 13:47   DG Wrist Complete Left  Result Date: 07-05-2020 CLINICAL DATA:  Left wrist pain.  No known injury. EXAM: LEFT WRIST - COMPLETE 3+ VIEW COMPARISON:  None. FINDINGS: There is no evidence of fracture or dislocation. Mild first CMC osteoarthritis noted. Soft tissues are unremarkable. IMPRESSION: Mild first CMC osteoarthritis.  Otherwise negative. Electronically Signed   By: Drusilla Kannerhomas  Dalessio M.D.   On: 002-25-2022 13:47   DG Wrist Complete Right  Result Date: 07-05-2020 CLINICAL DATA:  Right wrist pain.  No known injury. EXAM: RIGHT WRIST - COMPLETE 3+ VIEW COMPARISON:  None. FINDINGS: There is no evidence of fracture or dislocation. There is no evidence of arthropathy or other focal bone abnormality. Soft tissues are unremarkable. IMPRESSION: Negative exam. Electronically Signed   By: Drusilla Kannerhomas   Dalessio M.D.   On: 002-25-2022 13:46   CT Head Wo Contrast  Result Date: 07-05-2020 CLINICAL DATA:  Neck trauma (Age >= 65y); Mental status change, unknown cause, fell onto left side last night at home, denies head injury and loss of consciousness EXAM: CT HEAD WITHOUT CONTRAST CT CERVICAL SPINE WITHOUT CONTRAST TECHNIQUE: Multidetector CT imaging of the head and cervical spine was performed following the standard protocol without intravenous contrast. Multiplanar CT image reconstructions of the cervical spine were also generated. COMPARISON:  CT a head/neck 03/16/2016, MRI head report from 03/14/2016 FINDINGS: CT HEAD FINDINGS Brain: There is encephalomalacia and in the right frontal, right parietal, temporal, and occipital lobes compatible with old infarct. There is also encephalomalacia in the left occipital parietal lobe compatible with prior PCA infarct. Unchanged size of the ventricular system with exception of and ex vacuo dilation of the left posterior horn due to now chronic left PCA infarct. Unchanged small chronic infarcts in the cerebellum. There is no new loss of gray-white matter differentiation identified on noncontrast CT. Vascular: No hyperdense vessel or unexpected calcification. Skull: Normal. Negative for fracture or focal lesion. Sinuses/Orbits: There is a small left mastoid effusion. The paranasal sinuses are predominantly clear. The orbits are unremarkable. Other: None CT CERVICAL SPINE FINDINGS Mildly motion degraded exam of the cervical spine. Alignment: Straightening of the normal cervical lordosis, likely due to patient positioning or discomfort. Skull base and vertebrae: There is no evidence of acute fracture. Soft tissues and spinal canal:  No prevertebral fluid or swelling. No visible canal hematoma. Disc levels: There is degenerative disc disease at C3-C4 with small posterior disc osteophyte complex. Upper chest: Negative Other: None IMPRESSION: Multifocal encephalomalacia in the  cerebral and cerebellar hemispheres, right greater than left, compatible with old infarcts as seen on prior imaging in 2017. No acute intracranial hemorrhage or evidence of new large territory infarct. If there is concern for new acute infarct, with recommend MRI. No evidence of acute cervical spine fracture. Degenerative disc disease at C3-C4 with small posterior disc osteophyte complex. Electronically Signed   By: Caprice Renshaw   On: 09/15/2020 13:34   CT Cervical Spine Wo Contrast  Result Date: 09/23/2020 CLINICAL DATA:  Neck trauma (Age >= 65y); Mental status change, unknown cause, fell onto left side last night at home, denies head injury and loss of consciousness EXAM: CT HEAD WITHOUT CONTRAST CT CERVICAL SPINE WITHOUT CONTRAST TECHNIQUE: Multidetector CT imaging of the head and cervical spine was performed following the standard protocol without intravenous contrast. Multiplanar CT image reconstructions of the cervical spine were also generated. COMPARISON:  CT a head/neck 03/16/2016, MRI head report from 03/14/2016 FINDINGS: CT HEAD FINDINGS Brain: There is encephalomalacia and in the right frontal, right parietal, temporal, and occipital lobes compatible with old infarct. There is also encephalomalacia in the left occipital parietal lobe compatible with prior PCA infarct. Unchanged size of the ventricular system with exception of and ex vacuo dilation of the left posterior horn due to now chronic left PCA infarct. Unchanged small chronic infarcts in the cerebellum. There is no new loss of gray-white matter differentiation identified on noncontrast CT. Vascular: No hyperdense vessel or unexpected calcification. Skull: Normal. Negative for fracture or focal lesion. Sinuses/Orbits: There is a small left mastoid effusion. The paranasal sinuses are predominantly clear. The orbits are unremarkable. Other: None CT CERVICAL SPINE FINDINGS Mildly motion degraded exam of the cervical spine. Alignment: Straightening  of the normal cervical lordosis, likely due to patient positioning or discomfort. Skull base and vertebrae: There is no evidence of acute fracture. Soft tissues and spinal canal: No prevertebral fluid or swelling. No visible canal hematoma. Disc levels: There is degenerative disc disease at C3-C4 with small posterior disc osteophyte complex. Upper chest: Negative Other: None IMPRESSION: Multifocal encephalomalacia in the cerebral and cerebellar hemispheres, right greater than left, compatible with old infarcts as seen on prior imaging in 2017. No acute intracranial hemorrhage or evidence of new large territory infarct. If there is concern for new acute infarct, with recommend MRI. No evidence of acute cervical spine fracture. Degenerative disc disease at C3-C4 with small posterior disc osteophyte complex. Electronically Signed   By: Caprice Renshaw   On: 10/06/2020 13:34   DG CHEST PORT 1 VIEW  Result Date: 10-04-2020 CLINICAL DATA:  70 year old male with shortness of breath EXAM: PORTABLE CHEST 1 VIEW COMPARISON:  Chest radiograph dated 09/29/2020 FINDINGS: There is cardiomegaly with vascular congestion. No focal consolidation or pneumothorax. Trace pleural effusion may be present. No acute osseous pathology. IMPRESSION: Cardiomegaly with vascular congestion. No focal consolidation. Electronically Signed   By: Elgie Collard M.D.   On: 10-04-2020 02:11   DG Shoulder Left  Result Date: 09/19/2020 CLINICAL DATA:  Left shoulder pain. EXAM: LEFT SHOULDER - 2+ VIEW COMPARISON:  None. FINDINGS: No acute fracture or dislocation identified. Bones are osteopenic. Mild degenerative disease of the glenohumeral joint and proximal humerus. No bony lesions or destruction. IMPRESSION: No acute findings.  Mild degenerative disease of the shoulder. Electronically  Signed   By: Irish Lack M.D.   On: 09/28/2020 13:47   ECHOCARDIOGRAM COMPLETE  Result Date: 09/18/2020    ECHOCARDIOGRAM REPORT   Patient Name:   Robt Okuda. Date of Exam: 09/18/2020 Medical Rec #:  [id number]       Height:       69.0 in Accession #:    [id number]      Weight:       257.5 lb Date of Birth:  1951-02-20       BSA:          2.300 m Patient Age:    69 years        BP:           96/59 mmHg Patient Gender: M               HR:           91 bpm. [facility] Procedure: 2D Echo Indications:    NSTEMI I21.4  History:        Patient has prior history of Echocardiogram examinations, most                 recent 03/16/2016. Stroke; Risk Factors:Diabetes, Hypertension,                 Dyslipidemia and Former Smoker. Elevated Troponin, Aortic                 Embolism.  Sonographer:    Jeryl Columbia RDCS (AE) Referring Phys: [id number] Kateri Mc LATIF [facility] IMPRESSIONS  1. Left ventricular ejection fraction, by estimation, is <20%. The left ventricle has severely decreased function. The left ventricle demonstrates regional wall motion abnormalities (see scoring diagram/findings for description). There is mild left ventricular hypertrophy. Left ventricular diastolic parameters are indeterminate.  2. Right ventricular systolic function was not well visualized. The right ventricular size is normal. Tricuspid regurgitation signal is inadequate for assessing PA pressure.  3. The mitral valve is grossly normal. Trivial mitral valve regurgitation.  4. The aortic valve is tricuspid. Aortic valve regurgitation is not visualized.  5. The inferior vena cava is normal in size with greater than 50% respiratory variability, suggesting right atrial pressure of 3 mmHg. FINDINGS  Left Ventricle: Left ventricular ejection fraction, by estimation, is <20%. The left ventricle has severely decreased function. The left ventricle demonstrates regional wall motion abnormalities. The left ventricular internal cavity size was normal in size. There is mild left ventricular hypertrophy. Left ventricular diastolic parameters are indeterminate.  LV Wall Scoring: The mid  inferoseptal segment, apical septal segment, apical anterior segment, and apex are akinetic. The anterior wall, antero-lateral wall, entire inferior wall, and apical lateral segment are hypokinetic. The basal inferoseptal segment is normal. Right Ventricle: The right ventricular size is normal. Right vetricular wall thickness was not assessed. Right ventricular systolic function was not well visualized. Tricuspid regurgitation signal is inadequate for assessing PA pressure. Left Atrium: Left atrial size was normal in size. Right Atrium: Right atrial size was normal in size. Pericardium: There is no evidence of pericardial effusion. Presence of pericardial fat pad. Mitral Valve: The mitral valve is grossly normal. Trivial mitral valve regurgitation. Tricuspid Valve: The tricuspid valve is grossly normal. Tricuspid valve regurgitation is trivial. Aortic Valve: The aortic valve is tricuspid. There is mild to moderate aortic valve annular calcification. Aortic valve regurgitation is not visualized. Pulmonic Valve: The pulmonic valve was grossly normal. Pulmonic valve regurgitation is trivial. Aorta: The aortic root is  normal in size and structure. Venous: The inferior vena cava is normal in size with greater than 50% respiratory variability, suggesting right atrial pressure of 3 mmHg. IAS/Shunts: No atrial level shunt detected by color flow Doppler.  LEFT VENTRICLE PLAX 2D LVIDd:         5.88 cm  Diastology LVIDs:         4.89 cm  LV e' medial:    8.06 cm/s LV PW:         1.25 cm  LV E/e' medial:  9.1 LV IVS:        1.31 cm  LV e' lateral:   5.87 cm/s LVOT diam:     2.00 cm  LV E/e' lateral: 12.6 LVOT Area:     3.14 cm  RIGHT VENTRICLE RV S prime:     9.46 cm/s TAPSE (M-mode): 2.0 cm LEFT ATRIUM             Index       RIGHT ATRIUM           Index LA diam:        4.80 cm 2.09 cm/m  RA Area:     13.20 cm LA Vol (A2C):   70.4 ml 30.61 ml/m RA Volume:   29.50 ml  12.82 ml/m LA Vol (A4C):   58.5 ml 25.43 ml/m LA  Biplane Vol: 66.1 ml 28.74 ml/m   AORTA Ao Root diam: 3.00 cm MITRAL VALVE MV Area (PHT): 3.42 cm    SHUNTS MV Decel Time: 222 msec    Systemic Diam: 2.00 cm MV E velocity: 73.70 cm/s Nona Dell MD Electronically signed by Nona Dell MD Signature Date/Time: 09/18/2020/11:13:13 AM    Final     Microbiology Recent Results (from the past 240 hour(s))  Resp Panel by RT-PCR (Flu A&B, Covid) Nasopharyngeal Swab     Status: None   Collection Time: 09/24/2020  1:53 PM   Specimen: Nasopharyngeal Swab; Nasopharyngeal(NP) swabs in vial transport medium  Result Value Ref Range Status   SARS Coronavirus 2 by RT PCR NEGATIVE NEGATIVE Final    Comment: (NOTE) SARS-CoV-2 target nucleic acids are NOT DETECTED.  The SARS-CoV-2 RNA is generally detectable in upper respiratory specimens during the acute phase of infection. The lowest concentration of SARS-CoV-2 viral copies this assay can detect is 138 copies/mL. A negative result does not preclude SARS-Cov-2 infection and should not be used as the sole basis for treatment or other patient management decisions. A negative result may occur with  improper specimen collection/handling, submission of specimen other than nasopharyngeal swab, presence of viral mutation(s) within the areas targeted by this assay, and inadequate number of viral copies(<138 copies/mL). A negative result must be combined with clinical observations, patient history, and epidemiological information. The expected result is Negative.  Fact Sheet for Patients:  BloggerCourse.com  Fact Sheet for Healthcare Providers:  SeriousBroker.it  This test is no t yet approved or cleared by the [place] FDA and  has been authorized for detection and/or diagnosis of SARS-CoV-2 by FDA under an Emergency Use Authorization (EUA). This EUA will remain  in effect (meaning this test can be used) for the duration of the COVID-19  declaration under Section 564(b)(1) of the Act, 21 U.S.C.section 360bbb-3(b)(1), unless the authorization is terminated  or revoked sooner.       Influenza A by PCR NEGATIVE NEGATIVE Final   Influenza B by PCR NEGATIVE NEGATIVE Final    Comment: (NOTE) The Xpert Xpress SARS-CoV-2/FLU/RSV plus assay is  intended as an aid in the diagnosis of influenza from Nasopharyngeal swab specimens and should not be used as a sole basis for treatment. Nasal washings and aspirates are unacceptable for Xpert Xpress SARS-CoV-2/FLU/RSV testing.  Fact Sheet for Patients: BloggerCourse.com  Fact Sheet for Healthcare Providers: SeriousBroker.it  This test is not yet approved or cleared by the [place] FDA and has been authorized for detection and/or diagnosis of SARS-CoV-2 by FDA under an Emergency Use Authorization (EUA). This EUA will remain in effect (meaning this test can be used) for the duration of the COVID-19 declaration under Section 564(b)(1) of the Act, 21 U.S.C. section 360bbb-3(b)(1), unless the authorization is terminated or revoked.  Performed at [facility], [address], [place], [place] [address]   Culture, blood (routine x 2)     Status: None (Preliminary result)   Collection Time: Sep 22, 2020  2:37 PM   Specimen: BLOOD RIGHT HAND  Result Value Ref Range Status   Specimen Description BLOOD RIGHT HAND  Final   Special Requests   Final    BOTTLES DRAWN AEROBIC AND ANAEROBIC Blood Culture adequate volume   Culture   Final    NO GROWTH 3 DAYS Performed at [facility], [address] [place], [place] [address]    Report Status PENDING  Incomplete  Culture, blood (routine x 2)     Status: None (Preliminary result)   Collection Time: 22-Sep-2020  2:37 PM   Specimen: BLOOD LEFT HAND  Result Value Ref Range Status   Specimen Description BLOOD LEFT HAND  Final   Special Requests   Final    BOTTLES DRAWN AEROBIC AND ANAEROBIC Blood  Culture adequate volume   Culture   Final    NO GROWTH 3 DAYS Performed at [facility], [address] [place], [place] [address]    Report Status PENDING  Incomplete  MRSA PCR Screening     Status: None   Collection Time: 09/18/20  8:50 AM   Specimen: Nasal Mucosa; Nasopharyngeal  Result Value Ref Range Status   MRSA by PCR NEGATIVE NEGATIVE Final    Comment:        The GeneXpert MRSA Assay (FDA approved for NASAL specimens only), is one component of a comprehensive MRSA colonization surveillance program. It is not intended to diagnose MRSA infection nor to guide or monitor treatment for MRSA infections. Performed at [facility], [address], [place], [place] [address]   Urine culture     Status: Abnormal   Collection Time: 09/18/20  6:11 PM   Specimen: Urine, Catheterized  Result Value Ref Range Status   Specimen Description   Final    URINE, CATHETERIZED Performed at [facility], [address] [place], [place] [address]    Special Requests   Final    NONE Performed at [facility], [address] [place], [place] [address]    Culture (A)  Final    <10,000 COLONIES/mL INSIGNIFICANT GROWTH Performed at [facility], [address], [place], [place] [address]    Report Status 10/02/2020 FINAL  Final    Lab Basic Metabolic Panel: Recent Labs  Lab 09/22/20 1322 09/18/20 0027 09/19/20 0432 09/18/2020 0426  NA 136 139 134* 132*  K 4.3 4.6 4.4 4.9  CL 104 105 104 101  CO2 20* 22 20* 14*  GLUCOSE 188* 214* 195* 207*  BUN 21 24* 46* 69*  CREATININE 1.22 1.61* 2.51* 3.15*  CALCIUM 9.3 9.1 8.8* 8.5*   Liver Function Tests: Recent Labs  Lab 10/04/2020 1322 09/18/20 0027 09/19/20 0432  AST 38 83* 78*  ALT ALKPHOS 62 56 49  BILITOT 0.9 1.0 1.0  PROT 8.8* 8.1 7.9  ALBUMIN 4.3 4.0 3.6   CBC: Recent Labs  Lab 09/09/2020 1322 09/18/20 0027 09/19/20 0432 03-Oct-2020 0426  WBC 20.9* 19.8* 18.2* 24.7*  NEUTROABS 14.5*  --   --   --   HGB 14.3  13.3 11.7* 12.9*  HCT 45.9 41.9 36.6* 42.9  MCV 91.3 91.5 91.5 97.1  PLT 232 241 175 311   Sepsis Labs: Recent Labs  Lab 09/12/2020 1322 09/09/2020 1437 09/10/2020 1552 09/18/20 0027 09/19/20 0432 10/03/2020 0203 10/03/20 0426  WBC 20.9*  --   --  19.8* 18.2*  --  24.7*  LATICACIDVEN  --  2.5* 1.8  --   --  5.3* 4.2*    Procedures/Operations  2-D echo: 1. Left ventricular ejection fraction, by estimation, is <20%. The left  ventricle has severely decreased function. The left ventricle demonstrates  regional wall motion abnormalities (see scoring diagram/findings for  description). There is mild left  ventricular hypertrophy. Left ventricular diastolic parameters are  indeterminate.  LV Wall Scoring:  The mid inferoseptal segment, apical septal segment, apical anterior  segment,  and apex are akinetic. The anterior wall, antero-lateral wall, entire  inferior wall, and apical lateral segment are hypokinetic. The basal  inferoseptal segment is normal.  2. Right ventricular systolic function was not well visualized. The right  ventricular size is normal. Tricuspid regurgitation signal is inadequate  for assessing PA pressure.  3. The mitral valve is grossly normal. Trivial mitral valve  regurgitation.  4. The aortic valve is tricuspid. Aortic valve regurgitation is not  visualized.  5. The inferior vena cava is normal in size with greater than 50%  respiratory variability, suggesting right atrial pressure of 3 mmHg.    Vassie Loll 10/03/20, 11:36 AM

## 2020-10-09 NOTE — Progress Notes (Signed)
PT Cancellation Note  Patient Details Name: Shaun Pugh. MRN: [id number] DOB: 01/25/1951   Cancelled Treatment:    Reason Eval/Treat Not Completed: Medical issues which prohibited therapy.  Patient transferred to a higher level of care and will need new PT consult resume therapy when patient is medically stable.  Thank you.   11:10 AM, 09/11/2020 Ocie Bob, MPT Physical Therapist with [facility] [phone] office [phone] mobile phone

## 2020-10-09 NOTE — Progress Notes (Signed)
OT Cancellation Note  Patient Details Name: Shaun Pugh. MRN: [id number] DOB: [birth year]   Cancelled Treatment:    Reason Eval/Treat Not Completed: Medical issues which prohibited therapy;Patient not medically ready. Pt will be removed from OT list due to decrease in medical status. Pt not currently appropriate for rehab. Please submit a new order when pt is medically ready.   Lary Eckardt OT, MOT  Danie Chandler 09/22/2020, 9:26 AM

## 2020-10-09 DEATH — deceased
